# Patient Record
Sex: Female | Born: 1975
Health system: Southern US, Community
[De-identification: ages and names within clinical notes are randomized; demographics above are authoritative.]

## PROBLEM LIST (undated history)

## (undated) DIAGNOSIS — J45909 Unspecified asthma, uncomplicated: Secondary | ICD-10-CM

## (undated) DIAGNOSIS — I10 Essential (primary) hypertension: Secondary | ICD-10-CM

## (undated) DIAGNOSIS — K861 Other chronic pancreatitis: Secondary | ICD-10-CM

## (undated) DIAGNOSIS — M51379 Other intervertebral disc degeneration, lumbosacral region without mention of lumbar back pain or lower extremity pain: Secondary | ICD-10-CM

## (undated) DIAGNOSIS — M1990 Unspecified osteoarthritis, unspecified site: Secondary | ICD-10-CM

## (undated) DIAGNOSIS — R519 Headache, unspecified: Secondary | ICD-10-CM

## (undated) DIAGNOSIS — M5430 Sciatica, unspecified side: Secondary | ICD-10-CM

## (undated) DIAGNOSIS — E162 Hypoglycemia, unspecified: Secondary | ICD-10-CM

## (undated) DIAGNOSIS — E785 Hyperlipidemia, unspecified: Secondary | ICD-10-CM

## (undated) DIAGNOSIS — D352 Benign neoplasm of pituitary gland: Secondary | ICD-10-CM

## (undated) DIAGNOSIS — G4730 Sleep apnea, unspecified: Secondary | ICD-10-CM

## (undated) DIAGNOSIS — M5137 Other intervertebral disc degeneration, lumbosacral region: Secondary | ICD-10-CM

## (undated) DIAGNOSIS — E669 Obesity, unspecified: Secondary | ICD-10-CM

## (undated) HISTORY — DX: Hyperlipidemia, unspecified: E78.5

## (undated) HISTORY — PX: COLONOSCOPY: SHX174

## (undated) HISTORY — DX: Sciatica, unspecified side: M54.30

## (undated) HISTORY — DX: Obesity, unspecified: E66.9

## (undated) HISTORY — DX: Other intervertebral disc degeneration, lumbosacral region without mention of lumbar back pain or lower extremity pain: M51.379

## (undated) HISTORY — DX: Essential (primary) hypertension: I10

## (undated) HISTORY — DX: Other chronic pancreatitis: K86.1

## (undated) HISTORY — DX: Unspecified osteoarthritis, unspecified site: M19.90

## (undated) HISTORY — DX: Other intervertebral disc degeneration, lumbosacral region: M51.37

## (undated) HISTORY — PX: CHOLECYSTECTOMY: SHX55

---

## 2014-11-26 ENCOUNTER — Encounter: Payer: BLUE CROSS/BLUE SHIELD | Attending: Surgery | Admitting: Surgery

## 2014-11-26 DIAGNOSIS — I10 Essential (primary) hypertension: Secondary | ICD-10-CM | POA: Insufficient documentation

## 2014-11-26 DIAGNOSIS — T25222A Burn of second degree of left foot, initial encounter: Secondary | ICD-10-CM | POA: Insufficient documentation

## 2014-11-26 DIAGNOSIS — S91302A Unspecified open wound, left foot, initial encounter: Secondary | ICD-10-CM | POA: Diagnosis not present

## 2014-11-26 DIAGNOSIS — T25022A Burn of unspecified degree of left foot, initial encounter: Secondary | ICD-10-CM | POA: Diagnosis not present

## 2014-11-26 DIAGNOSIS — J45909 Unspecified asthma, uncomplicated: Secondary | ICD-10-CM | POA: Diagnosis not present

## 2014-11-26 DIAGNOSIS — X58XXXA Exposure to other specified factors, initial encounter: Secondary | ICD-10-CM | POA: Insufficient documentation

## 2014-11-27 NOTE — Progress Notes (Signed)
Beth [name], Beth [name] ([id number]) Visit Report for 11/26/2014 Abuse/Suicide Risk Screen Details Patient Name: Beth [name], Beth [name] Date of Service: 11/26/2014 9:00 AM Medical Record Patient Account Number: [id number] Number: Afful, RN, BSN, Treating RN: 23-Dec-1975 (39 y.o. Beth [name] Date of Birth/Sex: Female) Other Clinician: Primary Care Physician: Treating Britto, Errol Referring Physician: Lynnae Prude Physician/Extender: Weeks in Treatment: 0 Abuse/Suicide Risk Screen Items Answer ABUSE/SUICIDE RISK SCREEN: Has anyone close to you tried to hurt or harm you recentlyo No Do you feel uncomfortable with anyone in your familyo No Has anyone forced you do things that you didnot want to doo No Do you have any thoughts of harming yourselfo No Patient displays signs or symptoms of abuse and/or neglect. No Electronic Signature(s) Signed: 11/26/2014 4:46:46 PM By: Regan Lemming BSN, RN Entered By: Regan Lemming on 11/26/2014 09:12:23 Beth [name] ([id number]) -------------------------------------------------------------------------------- Activities of Daily Living Details Patient Name: Beth [name] Date of Service: 11/26/2014 9:00 AM Medical Record Patient Account Number: [id number] Number: Afful, RN, BSN, Treating RN: 1976-02-10 (39 y.o. Beth [name] Date of Birth/Sex: Female) Other Clinician: Primary Care Physician: Treating Christin Fudge Referring Physician: Lynnae Prude Physician/Extender: Suella Grove in Treatment: 0 Activities of Daily Living Items Answer Activities of Daily Living (Please select one for each item) Drive Automobile Completely Able Take Medications Completely Able Use Telephone Completely Able Care for Appearance Completely Able Use Toilet Completely Able Bath / Shower Completely Able Dress Self Completely Able Feed Self Completely Able Walk Completely Able Get In / Out Bed Completely Able Housework Completely Able Prepare Meals Completely Able Handle Money  Completely Able Shop for Self Completely Able Electronic Signature(s) Signed: 11/26/2014 4:46:46 PM By: Regan Lemming BSN, RN Entered By: Regan Lemming on 11/26/2014 09:12:00 Beth [name] ([id number]) -------------------------------------------------------------------------------- Education Assessment Details Patient Name: Beth [name] Date of Service: 11/26/2014 9:00 AM Medical Record Patient Account Number: [id number] Number: Afful, RN, BSN, Treating RN: 07-14-1975 (39 y.o. Beth [name] Date of Birth/Sex: Female) Other Clinician: Primary Care Physician: Treating Christin Fudge Referring Physician: Lynnae Prude Physician/Extender: Suella Grove in Treatment: 0 Primary Learner Assessed: Patient Learning Preferences/Education Level/Primary Language Learning Preference: Explanation Highest Education Level: College or Above Preferred Language: English Cognitive Barrier Assessment/Beliefs Language Barrier: No Physical Barrier Assessment Impaired Vision: Yes Glasses Knowledge/Comprehension Assessment Knowledge Level: High Comprehension Level: High Ability to understand written High instructions: Ability to understand verbal High instructions: Motivation Assessment Anxiety Level: Calm Cooperation: Cooperative Education Importance: Acknowledges Need Interest in Health Problems: Asks Questions Perception: Coherent Willingness to Engage in Self- High Management Activities: Readiness to Engage in Self- High Management Activities: Electronic Signature(s) Signed: 11/26/2014 4:46:46 PM By: Regan Lemming BSN, RN Entered By: Regan Lemming on 11/26/2014 09:11:37 Beth [name] ([id number]) -------------------------------------------------------------------------------- Fall Risk Assessment Details Patient Name: Beth [name] Date of Service: 11/26/2014 9:00 AM Medical Record Patient Account Number: [id number] Number: Afful, RN, BSN, Treating RN: Apr 22, 1976 (39 y.o.  Beth [name] Date of Birth/Sex: Female) Other Clinician: Primary Care Physician: Treating Christin Fudge Referring Physician: Lynnae Prude Physician/Extender: Suella Grove in Treatment: 0 Fall Risk Assessment Items FALL RISK ASSESSMENT: History of falling - immediate or within 3 months 0 No Secondary diagnosis 0 No Ambulatory aid None/bed rest/wheelchair/nurse 0 No Crutches/cane/walker 15 Yes Furniture 0 No IV Access/Saline Lock 0 No Gait/Training Normal/bed rest/immobile 0 Yes Weak 0 No Impaired 0 No Mental Status Oriented to own ability 0 Yes Electronic Signature(s) Signed: 11/26/2014 4:46:46 PM By: Regan Lemming BSN, RN Entered By: Regan Lemming on 11/26/2014 09:11:13 Beth [name] ([id number]) -------------------------------------------------------------------------------- Foot Assessment Details Patient Name: Beth [name] Date of Service:  11/26/2014 9:00 AM Medical Record Patient Account Number: [id number] Number: Afful, RN, BSN, Treating RN: [birth year] (39 y.o. Beth [name] Date of Birth/Sex: Female) Other Clinician: Primary Care Physician: Treating Britto, Errol Referring Physician: Lynnae Prude Physician/Extender: Suella Grove in Treatment: 0 Foot Assessment Items Site Locations + = Sensation present, - = Sensation absent, C = Callus, U = Ulcer R = Redness, W = Warmth, M = Maceration, PU = Pre-ulcerative lesion F = Fissure, S = Swelling, D = Dryness Assessment Right: Left: Other Deformity: No No Prior Foot Ulcer: No No Prior Amputation: No No Charcot Joint: No No Ambulatory Status: Ambulatory With Help Assistance Device: Crutches Gait: Buyer, retail Signature(s) Signed: 11/26/2014 4:46:46 PM By: Regan Lemming BSN, RN Entered By: Regan Lemming on 11/26/2014 09:10:41 Beth [name], Beth [name] ([id number]) Beth [name], Beth [name] ([id number]) -------------------------------------------------------------------------------- Nutrition Risk Assessment Details Patient Name: Beth [name] Date of Service: 11/26/2014 9:00 AM Medical Record Patient Account Number: [id number] Number: Afful, RN, BSN, Treating RN: [birth year] (39 y.o. Beth [name] Date of Birth/Sex: Female) Other Clinician: Primary Care Physician: Treating Christin Fudge Referring Physician: Lynnae Prude Physician/Extender: Weeks in Treatment: 0 Height (in): Weight (lbs): Body Mass Index (BMI): Nutrition Risk Assessment Items NUTRITION RISK SCREEN: I have an illness or condition that made me change the kind and/or 0 No amount of food I eat I eat fewer than two meals per day 0 No I eat few fruits and vegetables, or milk products 0 No I have three or more drinks of beer, liquor or wine almost every day 0 No I have tooth or mouth problems that make it hard for me to eat 0 No I don't always have enough money to buy the food I need 0 No I eat alone most of the time 0 No I take three or more different prescribed or over-the-counter drugs a 0 No day Without wanting to, I have lost or gained 10 pounds in the last six 0 No months I am not always physically able to shop, cook and/or feed myself 0 No Nutrition Protocols Good Risk Protocol 0 No interventions needed Moderate Risk Protocol Electronic Signature(s) Signed: 11/26/2014 4:46:46 PM By: Regan Lemming BSN, RN Entered By: Regan Lemming on 11/26/2014 09:10:53
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
## 2014-12-04 ENCOUNTER — Encounter: Payer: BLUE CROSS/BLUE SHIELD | Admitting: Surgery

## 2014-12-04 DIAGNOSIS — T25022A Burn of unspecified degree of left foot, initial encounter: Secondary | ICD-10-CM | POA: Diagnosis not present
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
## 2016-03-10 DIAGNOSIS — K861 Other chronic pancreatitis: Secondary | ICD-10-CM | POA: Diagnosis not present

## 2016-03-10 DIAGNOSIS — R197 Diarrhea, unspecified: Secondary | ICD-10-CM | POA: Diagnosis not present

## 2016-03-10 DIAGNOSIS — K909 Intestinal malabsorption, unspecified: Secondary | ICD-10-CM | POA: Diagnosis not present

## 2016-03-13 DIAGNOSIS — K909 Intestinal malabsorption, unspecified: Secondary | ICD-10-CM | POA: Diagnosis not present

## 2016-03-14 ENCOUNTER — Other Ambulatory Visit: Payer: Self-pay | Admitting: Physician Assistant

## 2016-03-14 DIAGNOSIS — K909 Intestinal malabsorption, unspecified: Secondary | ICD-10-CM

## 2016-03-14 DIAGNOSIS — K861 Other chronic pancreatitis: Secondary | ICD-10-CM

## 2016-03-21 ENCOUNTER — Ambulatory Visit
Admission: RE | Admit: 2016-03-21 | Discharge: 2016-03-21 | Disposition: A | Discharge status: Home / Self Care | Payer: BLUE CROSS/BLUE SHIELD | Source: Ambulatory Visit | Attending: Physician Assistant | Admitting: Physician Assistant

## 2016-03-21 DIAGNOSIS — K909 Intestinal malabsorption, unspecified: Secondary | ICD-10-CM

## 2016-03-21 DIAGNOSIS — K861 Other chronic pancreatitis: Secondary | ICD-10-CM | POA: Diagnosis not present

## 2016-03-21 MED ORDER — GADOBENATE DIMEGLUMINE 529 MG/ML IV SOLN
20.0000 mL | Freq: Once | INTRAVENOUS | Status: AC | PRN
  Administered 2016-03-21: 20 mL via INTRAVENOUS

## 2016-04-28 DIAGNOSIS — K861 Other chronic pancreatitis: Secondary | ICD-10-CM | POA: Diagnosis not present

## 2016-05-11 DIAGNOSIS — K861 Other chronic pancreatitis: Secondary | ICD-10-CM | POA: Diagnosis not present

## 2016-05-11 DIAGNOSIS — J45909 Unspecified asthma, uncomplicated: Secondary | ICD-10-CM | POA: Diagnosis not present

## 2016-05-11 DIAGNOSIS — N912 Amenorrhea, unspecified: Secondary | ICD-10-CM | POA: Diagnosis not present

## 2016-05-11 DIAGNOSIS — D352 Benign neoplasm of pituitary gland: Secondary | ICD-10-CM | POA: Diagnosis not present

## 2016-11-09 DIAGNOSIS — K861 Other chronic pancreatitis: Secondary | ICD-10-CM | POA: Diagnosis not present

## 2016-11-09 DIAGNOSIS — Z6838 Body mass index (BMI) 38.0-38.9, adult: Secondary | ICD-10-CM | POA: Insufficient documentation

## 2016-11-09 DIAGNOSIS — E782 Mixed hyperlipidemia: Secondary | ICD-10-CM | POA: Diagnosis not present

## 2016-11-09 DIAGNOSIS — D352 Benign neoplasm of pituitary gland: Secondary | ICD-10-CM | POA: Diagnosis present

## 2016-11-09 DIAGNOSIS — B3789 Other sites of candidiasis: Secondary | ICD-10-CM | POA: Insufficient documentation

## 2016-11-09 DIAGNOSIS — J4520 Mild intermittent asthma, uncomplicated: Secondary | ICD-10-CM | POA: Insufficient documentation

## 2017-07-16 DIAGNOSIS — Z01419 Encounter for gynecological examination (general) (routine) without abnormal findings: Secondary | ICD-10-CM | POA: Diagnosis not present

## 2017-07-27 DIAGNOSIS — K8689 Other specified diseases of pancreas: Secondary | ICD-10-CM | POA: Diagnosis not present

## 2017-07-27 DIAGNOSIS — K219 Gastro-esophageal reflux disease without esophagitis: Secondary | ICD-10-CM | POA: Diagnosis not present

## 2017-10-12 DIAGNOSIS — D352 Benign neoplasm of pituitary gland: Secondary | ICD-10-CM | POA: Diagnosis not present

## 2017-10-12 DIAGNOSIS — G43909 Migraine, unspecified, not intractable, without status migrainosus: Secondary | ICD-10-CM | POA: Insufficient documentation

## 2017-10-12 DIAGNOSIS — K861 Other chronic pancreatitis: Secondary | ICD-10-CM | POA: Diagnosis not present

## 2017-10-12 DIAGNOSIS — R10813 Right lower quadrant abdominal tenderness: Secondary | ICD-10-CM | POA: Diagnosis not present

## 2017-10-12 DIAGNOSIS — J029 Acute pharyngitis, unspecified: Secondary | ICD-10-CM | POA: Diagnosis not present

## 2017-10-12 DIAGNOSIS — R1110 Vomiting, unspecified: Secondary | ICD-10-CM | POA: Diagnosis not present

## 2018-02-15 ENCOUNTER — Encounter: Payer: Self-pay | Admitting: Endocrinology

## 2018-02-15 ENCOUNTER — Ambulatory Visit: Payer: BLUE CROSS/BLUE SHIELD | Admitting: Endocrinology

## 2018-02-15 DIAGNOSIS — E221 Hyperprolactinemia: Secondary | ICD-10-CM

## 2018-02-15 DIAGNOSIS — K861 Other chronic pancreatitis: Secondary | ICD-10-CM | POA: Diagnosis not present

## 2018-02-15 LAB — TSH: TSH: 0.42 u[IU]/mL (ref 0.35–4.50)

## 2018-02-15 LAB — T4, FREE: Free T4: 0.78 ng/dL (ref 0.60–1.60)

## 2018-02-15 NOTE — Progress Notes (Signed)
Subjective:    Patient ID: Beth [name], female    DOB: 1975-12-16, 42 y.o.   MRN: [id number]  HPI Pt is referred by Rebecca Eaton, PA, for hyperprolactinemia.  Pt had menarche at age 33.  She has had no menses since 2012.  She is G4P3.   She was noted to have an elevated prolactin in 2013. She reported slight drainage from both breasts, and assoc hair growth on the face.  She denies the following: excessive exercise, opiates, antipsychotics, brain XRT, brain surgery, cirrhosis, thyroid dz, and seizures.  She takes chronic phenergan, (not zofran) for nausea (chronic pancreatitis).  Pt denies h/o h/o liver dz, infertility, seizures, PCO, renal dz, zoster, brain injury, or chest wall injury.  She has Mirena IUD.  Parlodel was recently resumed at [facility].  She is unaware of ever having had MRI.   Past Medical History:  Diagnosis Date  . Chronic pancreatitis [facility])      Social History   Socioeconomic History  . Marital status: Divorced    Spouse name: Not on file  . Number of children: Not on file  . Years of education: Not on file  . Highest education level: Not on file  Occupational History  . Not on file  Social Needs  . Financial resource strain: Not on file  . Food insecurity:    Worry: Not on file    Inability: Not on file  . Transportation needs:    Medical: Not on file    Non-medical: Not on file  Tobacco Use  . Smoking status: Never Smoker  . Smokeless tobacco: Never Used  Substance and Sexual Activity  . Alcohol use: Never    Frequency: Never  . Drug use: Never  . Sexual activity: Yes  Lifestyle  . Physical activity:    Days per week: Not on file    Minutes per session: Not on file  . Stress: Not on file  Relationships  . Social connections:    Talks on phone: Not on file    Gets together: Not on file    Attends religious service: Not on file    Active member of club or organization: Not on file    Attends meetings of clubs or organizations: Not on file   Relationship status: Not on file  . Intimate partner violence:    Fear of current or ex partner: Not on file    Emotionally abused: Not on file    Physically abused: Not on file    Forced sexual activity: Not on file  Other Topics Concern  . Not on file  Social History Narrative  . Not on file    Current Outpatient Medications on File Prior to Visit  Medication Sig Dispense Refill  . bromocriptine (PARLODEL) 2.5 MG tablet bromocriptine 2.5 mg tablet    . EPINEPHrine (EPIPEN 2-PAK) 0.3 mg/0.3 mL IJ SOAJ injection Inject into the muscle once.    . naftifine (NAFTIN) 1 % cream Apply topically.    . naproxen sodium (ALEVE) 220 MG tablet Take 220 mg by mouth.    . omega-3 fish oil (MAXEPA) 1000 MG CAPS capsule Take 2 capsules by mouth.    . Pancrelipase, Lip-Prot-Amyl, (CREON) 24000-76000 units CPEP TK 2 CS PO TID WITH MEALS AND 1 BID WITH SNACKS. MAX OF 8 PER DAY.    Marland Kitchen atorvastatin (LIPITOR) 10 MG tablet Take by mouth.    Marland Kitchen HYDROcodone-acetaminophen (NORCO/VICODIN) 5-325 MG tablet Take by mouth.    . levonorgestrel (MIRENA)  20 MCG/24HR IUD by Intrauterine route.    . meloxicam (MOBIC) 7.5 MG tablet Take by mouth.    . mometasone-formoterol (DULERA) 100-5 MCG/ACT AERO Inhale into the lungs.    . ondansetron (ZOFRAN-ODT) 4 MG disintegrating tablet ondansetron 4 mg disintegrating tablet    . tizanidine (ZANAFLEX) 2 MG capsule Take by mouth.     No current facility-administered medications on file prior to visit.     Allergies  Allergen Reactions  . [place] Swelling  . [place] Grass   . Cedar Swelling  . Cladosporium Cladosporioides   . Dust Mite Extract   . Elm Bark [Ulmus Fulva] Swelling  . Lidocaine Hcl Swelling  . Molds & Smuts   . Mugwort   . Nettle [Urtica Dioica]   . Sorrel-Dock Mix [Sheep Sorrel-Yellow Dock]   . Timothy Grass Pollen Allergen   . Xylocaine  [Lidocaine]     Family History  Problem Relation Age of Onset  . Other Paternal Aunt        pituitary surgery     BP 132/78 (BP Location: Right Arm, Patient Position: Sitting, Cuff Size: Normal)   Pulse 67   Ht 5\' 9"  (1.753 m)   Wt 264 lb 6.4 oz (119.9 kg)   SpO2 97%   BMI 39.05 kg/m    Review of Systems Denies sob, excessive sweating, hair loss on the head, diplopia, depression, cold intolerance, numbness, voice change, fatigue, change in skin tone, easy bruising, and dry skin.  She has intermitt headache, insomnia, pelvic cramps, gain, and leg swelling.      Objective:   Physical Exam VS: see vs page GEN: no distress HEAD: head: no deformity.  Slight terminal hair growth on the face.   eyes: no periorbital swelling, no proptosis external nose and ears are normal mouth: no lesion seen NECK: supple, thyroid is not enlarged CHEST WALL: no deformity LUNGS: clear to auscultation CV: reg rate and rhythm, no murmur.   ABD: abdomen is soft, nontender.  no hepatosplenomegaly.  not distended.  no hernia MUSCULOSKELETAL: muscle bulk and strength are grossly normal.  no obvious joint swelling.  gait is normal and steady EXTEMITIES: no deformity.  no edema PULSES: no carotid bruit NEURO:  cn 2-12 grossly intact.   readily moves all 4's.  sensation is intact to touch on all 4's SKIN:  Normal texture and temperature.  No rash or suspicious lesion is visible.   NODES:  None palpable at the neck PSYCH: alert, well-oriented.  Does not appear anxious nor depressed.    We are requesting labs from [facility].    I have reviewed outside records, and summarized: Pt was noted to have elevated a1c, and referred here.  Pt was noted to also have h/o cholecystectomy and chronic pancreatitis.       Assessment & Plan:  Hyperprolactinemia, new to me.  Poss due to phenergan.  Patient Instructions  blood tests are requested for you today.  We'll let you know about the results.  Please come back for a follow-up appointment in 6 months.

## 2018-02-15 NOTE — Patient Instructions (Addendum)
blood tests are requested for you today.  We'll let you know about the results.   Please come back for a follow-up appointment in 6 months.  

## 2018-02-17 ENCOUNTER — Other Ambulatory Visit: Payer: Self-pay | Admitting: Endocrinology

## 2018-02-17 ENCOUNTER — Encounter: Payer: Self-pay | Admitting: Endocrinology

## 2018-02-17 DIAGNOSIS — K861 Other chronic pancreatitis: Secondary | ICD-10-CM | POA: Insufficient documentation

## 2018-02-17 DIAGNOSIS — E221 Hyperprolactinemia: Secondary | ICD-10-CM

## 2018-02-17 MED ORDER — BROMOCRIPTINE MESYLATE 5 MG PO CAPS: 5.0000 mg | ORAL_CAPSULE | Freq: Every day | ORAL | 3 refills | Status: DC

## 2018-02-24 LAB — INSULIN-LIKE GROWTH FACTOR
IGF-I, LC/MS: 99 ng/mL (ref 52–328)
Z-Score (Female): -0.8 SD (ref ?–2.0)

## 2018-02-24 LAB — PROLACTIN: Prolactin: 34.9 ng/mL — ABNORMAL HIGH

## 2018-02-24 LAB — SOMATOSTATIN: SOMATOSTATIN: 30 pg/mL

## 2018-03-22 ENCOUNTER — Telehealth: Payer: Self-pay | Admitting: Endocrinology

## 2018-03-22 ENCOUNTER — Other Ambulatory Visit (INDEPENDENT_AMBULATORY_CARE_PROVIDER_SITE_OTHER): Payer: BLUE CROSS/BLUE SHIELD

## 2018-03-22 DIAGNOSIS — E221 Hyperprolactinemia: Secondary | ICD-10-CM

## 2018-03-22 NOTE — Telephone Encounter (Signed)
Please advise on below  

## 2018-03-22 NOTE — Telephone Encounter (Signed)
I have sent a prescription to your pharmacy, for 2.5 mg Alternative is for another medication.  Please let me know if we need to change

## 2018-03-22 NOTE — Telephone Encounter (Signed)
Pt. Is stating this medication bromocriptine (PARLODEL) 5 MG capsule is way to strong and needs a lower dose. Please advice. Ph # [phone]

## 2018-03-23 LAB — PROLACTIN: Prolactin: 5.5 ng/mL

## 2018-03-25 NOTE — Telephone Encounter (Signed)
Pt infromed

## 2018-08-12 DIAGNOSIS — Z01419 Encounter for gynecological examination (general) (routine) without abnormal findings: Secondary | ICD-10-CM | POA: Diagnosis not present

## 2018-08-12 DIAGNOSIS — Z202 Contact with and (suspected) exposure to infections with a predominantly sexual mode of transmission: Secondary | ICD-10-CM | POA: Diagnosis not present

## 2018-08-22 ENCOUNTER — Ambulatory Visit: Payer: BLUE CROSS/BLUE SHIELD | Admitting: Endocrinology

## 2018-08-27 DIAGNOSIS — J45909 Unspecified asthma, uncomplicated: Secondary | ICD-10-CM | POA: Diagnosis not present

## 2018-09-24 ENCOUNTER — Ambulatory Visit: Payer: BLUE CROSS/BLUE SHIELD | Admitting: Endocrinology

## 2018-10-28 ENCOUNTER — Telehealth: Payer: Self-pay

## 2018-10-28 NOTE — Telephone Encounter (Signed)
Pt overdue for an appt. Called to schedule an appt. VM not set up. No alternate # to call to schedule.

## 2019-01-31 DIAGNOSIS — I10 Essential (primary) hypertension: Secondary | ICD-10-CM | POA: Diagnosis not present

## 2019-01-31 DIAGNOSIS — R609 Edema, unspecified: Secondary | ICD-10-CM | POA: Diagnosis not present

## 2019-02-19 DIAGNOSIS — I10 Essential (primary) hypertension: Secondary | ICD-10-CM | POA: Diagnosis not present

## 2019-04-01 ENCOUNTER — Other Ambulatory Visit: Payer: Self-pay

## 2019-04-03 ENCOUNTER — Other Ambulatory Visit: Payer: Self-pay

## 2019-04-03 ENCOUNTER — Encounter: Payer: Self-pay | Admitting: Endocrinology

## 2019-04-03 ENCOUNTER — Ambulatory Visit (INDEPENDENT_AMBULATORY_CARE_PROVIDER_SITE_OTHER): Payer: BC Managed Care – PPO | Admitting: Endocrinology

## 2019-04-03 VITALS — BP 104/74 | HR 100 | Ht 69.0 in | Wt 257.8 lb

## 2019-04-03 DIAGNOSIS — E221 Hyperprolactinemia: Secondary | ICD-10-CM

## 2019-04-03 LAB — TSH: TSH: 1.24 u[IU]/mL (ref 0.35–4.50)

## 2019-04-03 NOTE — Patient Instructions (Signed)
Blood tests are requested for you today.  We'll let you know about the results.  Based on the results, i'll refill the bromocriptine. the elevated prolactin is probably due to the promethazine, so we can possibly further reduce the bromocriptine.  Please come back for a follow-up appointment in 1 year.

## 2019-04-03 NOTE — Progress Notes (Signed)
Subjective:    Patient ID: Beth [name], female    DOB: 04/21/1976, 43 y.o.   MRN: [id number]  HPI Pt returns for f/u of hyperprolactinemia (dx'ed 2013, when she presented with galactorrhea; she has never had pituitary imaging; she has had no menses since 2012.  She is G4P3; she takes chronic phenergan, for nausea (chronic pancreatitis; she has Mirena IUD).  She has not recently taken phenergan.  pt states she feels well in general. Past Medical History:  Diagnosis Date  . Chronic pancreatitis [facility])    Social History   Socioeconomic History  . Marital status: Divorced    Spouse name: Not on file  . Number of children: Not on file  . Years of education: Not on file  . Highest education level: Not on file  Occupational History  . Not on file  Social Needs  . Financial resource strain: Not on file  . Food insecurity    Worry: Not on file    Inability: Not on file  . Transportation needs    Medical: Not on file    Non-medical: Not on file  Tobacco Use  . Smoking status: Never Smoker  . Smokeless tobacco: Never Used  Substance and Sexual Activity  . Alcohol use: Never    Frequency: Never  . Drug use: Never  . Sexual activity: Yes  Lifestyle  . Physical activity    Days per week: Not on file    Minutes per session: Not on file  . Stress: Not on file  Relationships  . Social Herbalist on phone: Not on file    Gets together: Not on file    Attends religious service: Not on file    Active member of club or organization: Not on file    Attends meetings of clubs or organizations: Not on file    Relationship status: Not on file  . Intimate partner violence    Fear of current or ex partner: Not on file    Emotionally abused: Not on file    Physically abused: Not on file    Forced sexual activity: Not on file  Other Topics Concern  . Not on file  Social History Narrative  . Not on file    Current Outpatient Medications on File Prior to Visit  Medication  Sig Dispense Refill  . albuterol (PROAIR HFA) 108 (90 Base) MCG/ACT inhaler Inhale 1 puff into the lungs as needed.    Marland Kitchen atorvastatin (LIPITOR) 10 MG tablet Take by mouth.    . EPINEPHrine (EPIPEN 2-PAK) 0.3 mg/0.3 mL IJ SOAJ injection Inject into the muscle once.    . hydrochlorothiazide (HYDRODIURIL) 12.5 MG tablet Take 12.5 mg by mouth daily.    Marland Kitchen HYDROcodone-acetaminophen (NORCO/VICODIN) 5-325 MG tablet Take by mouth.    . levonorgestrel (MIRENA) 20 MCG/24HR IUD by Intrauterine route.    . meloxicam (MOBIC) 7.5 MG tablet Take by mouth.    . mometasone-formoterol (DULERA) 100-5 MCG/ACT AERO Inhale into the lungs.    . naftifine (NAFTIN) 1 % cream Apply topically.    . naproxen sodium (ALEVE) 220 MG tablet Take 220 mg by mouth.    . omega-3 fish oil (MAXEPA) 1000 MG CAPS capsule Take 2 capsules by mouth.    . ondansetron (ZOFRAN-ODT) 4 MG disintegrating tablet ondansetron 4 mg disintegrating tablet    . Pancrelipase, Lip-Prot-Amyl, (CREON) 24000-76000 units CPEP TK 2 CS PO TID WITH MEALS AND 1 BID WITH SNACKS. MAX OF 8 PER DAY.    Marland Kitchen  tizanidine (ZANAFLEX) 2 MG capsule Take by mouth.     No current facility-administered medications on file prior to visit.     Allergies  Allergen Reactions  . [place] Swelling  . [place] Grass   . Cedar Swelling  . Cladosporium Cladosporioides   . Dust Mite Extract   . Elm Bark [Ulmus Fulva] Swelling  . Lidocaine Hcl Swelling  . Molds & Smuts   . Mugwort   . Nettle [Urtica Dioica]   . Sorrel-Dock Mix [Sheep Sorrel-Yellow Dock]   . Timothy Grass Pollen Allergen   . Xylocaine  [Lidocaine]     Family History  Problem Relation Age of Onset  . Other Paternal Aunt        pituitary surgery    BP 104/74 (BP Location: Right Arm, Patient Position: Sitting, Cuff Size: Large)   Pulse 100   Ht 5\' 9"  (1.753 m)   Wt 257 lb 12.8 oz (116.9 kg)   SpO2 95%   BMI 38.07 kg/m    Review of Systems Denies dizziness, but she has fatigue.     Objective:    Physical Exam VITAL SIGNS:  See vs page GENERAL: no distress Ext: no leg edema   Prolactin=35    Assessment & Plan:  Hyperprolactinemia, worse off rx  Patient Instructions  Blood tests are requested for you today.  We'll let you know about the results.  Based on the results, i'll refill the bromocriptine. the elevated prolactin is probably due to the promethazine, so we can possibly further reduce the bromocriptine.  Please come back for a follow-up appointment in 1 year.

## 2019-04-04 LAB — PROLACTIN: Prolactin: 32.9 ng/mL — ABNORMAL HIGH

## 2019-04-04 MED ORDER — BROMOCRIPTINE MESYLATE 2.5 MG PO TABS: 1.2500 mg | ORAL_TABLET | Freq: Every day | ORAL | 3 refills | Status: DC

## 2019-04-25 DIAGNOSIS — K8689 Other specified diseases of pancreas: Secondary | ICD-10-CM | POA: Diagnosis not present

## 2019-04-25 DIAGNOSIS — K219 Gastro-esophageal reflux disease without esophagitis: Secondary | ICD-10-CM | POA: Diagnosis not present

## 2019-04-25 DIAGNOSIS — R198 Other specified symptoms and signs involving the digestive system and abdomen: Secondary | ICD-10-CM | POA: Diagnosis not present

## 2019-05-15 ENCOUNTER — Other Ambulatory Visit: Payer: BC Managed Care – PPO

## 2019-05-15 ENCOUNTER — Other Ambulatory Visit: Payer: Self-pay

## 2019-05-15 DIAGNOSIS — E221 Hyperprolactinemia: Secondary | ICD-10-CM | POA: Diagnosis not present

## 2019-05-16 ENCOUNTER — Telehealth: Payer: Self-pay

## 2019-05-16 LAB — PROLACTIN: Prolactin: 5.4 ng/mL

## 2019-05-16 NOTE — Telephone Encounter (Signed)
Lab results reviewed by Dr. Ellison. Letter has been mailed. For future reference, letter can be found in Epic. 

## 2019-05-16 NOTE — Telephone Encounter (Signed)
-----   Message from Renato Shin, MD sent at 05/16/2019 11:21 AM EST ----- please contact patient: Normal.  Please continue the same medications.

## 2019-06-09 DIAGNOSIS — K861 Other chronic pancreatitis: Secondary | ICD-10-CM | POA: Diagnosis not present

## 2019-06-09 DIAGNOSIS — Z30431 Encounter for routine checking of intrauterine contraceptive device: Secondary | ICD-10-CM | POA: Insufficient documentation

## 2019-06-09 DIAGNOSIS — I10 Essential (primary) hypertension: Secondary | ICD-10-CM | POA: Diagnosis not present

## 2019-06-09 DIAGNOSIS — E782 Mixed hyperlipidemia: Secondary | ICD-10-CM | POA: Diagnosis not present

## 2019-06-09 DIAGNOSIS — K5901 Slow transit constipation: Secondary | ICD-10-CM | POA: Insufficient documentation

## 2019-06-09 DIAGNOSIS — R5383 Other fatigue: Secondary | ICD-10-CM | POA: Diagnosis not present

## 2019-06-09 DIAGNOSIS — Z6838 Body mass index (BMI) 38.0-38.9, adult: Secondary | ICD-10-CM | POA: Diagnosis not present

## 2019-06-23 DIAGNOSIS — Z1159 Encounter for screening for other viral diseases: Secondary | ICD-10-CM | POA: Diagnosis not present

## 2019-06-24 DIAGNOSIS — Z8371 Family history of colonic polyps: Secondary | ICD-10-CM | POA: Diagnosis not present

## 2019-06-24 DIAGNOSIS — D125 Benign neoplasm of sigmoid colon: Secondary | ICD-10-CM | POA: Diagnosis not present

## 2019-06-24 DIAGNOSIS — K648 Other hemorrhoids: Secondary | ICD-10-CM | POA: Diagnosis not present

## 2019-06-24 DIAGNOSIS — Z800 Family history of malignant neoplasm of digestive organs: Secondary | ICD-10-CM | POA: Diagnosis not present

## 2019-06-24 DIAGNOSIS — K5730 Diverticulosis of large intestine without perforation or abscess without bleeding: Secondary | ICD-10-CM | POA: Diagnosis not present

## 2019-06-24 DIAGNOSIS — D120 Benign neoplasm of cecum: Secondary | ICD-10-CM | POA: Diagnosis not present

## 2019-08-14 DIAGNOSIS — Z01419 Encounter for gynecological examination (general) (routine) without abnormal findings: Secondary | ICD-10-CM | POA: Diagnosis not present

## 2019-08-14 DIAGNOSIS — Z113 Encounter for screening for infections with a predominantly sexual mode of transmission: Secondary | ICD-10-CM | POA: Diagnosis not present

## 2019-09-11 DIAGNOSIS — Z1231 Encounter for screening mammogram for malignant neoplasm of breast: Secondary | ICD-10-CM | POA: Diagnosis not present

## 2019-10-10 DIAGNOSIS — Z03818 Encounter for observation for suspected exposure to other biological agents ruled out: Secondary | ICD-10-CM | POA: Diagnosis not present

## 2019-10-10 DIAGNOSIS — Z20828 Contact with and (suspected) exposure to other viral communicable diseases: Secondary | ICD-10-CM | POA: Diagnosis not present

## 2019-12-05 ENCOUNTER — Emergency Department (HOSPITAL_COMMUNITY): Payer: BC Managed Care – PPO

## 2019-12-05 ENCOUNTER — Encounter (HOSPITAL_COMMUNITY): Payer: Self-pay | Admitting: Emergency Medicine

## 2019-12-05 ENCOUNTER — Emergency Department (HOSPITAL_COMMUNITY)
Admission: EM | Admit: 2019-12-05 | Discharge: 2019-12-05 | Disposition: A | Discharge status: Home / Self Care | Payer: BC Managed Care – PPO | Attending: Emergency Medicine | Admitting: Emergency Medicine

## 2019-12-05 ENCOUNTER — Other Ambulatory Visit: Payer: Self-pay

## 2019-12-05 DIAGNOSIS — J1282 Pneumonia due to coronavirus disease 2019: Secondary | ICD-10-CM | POA: Insufficient documentation

## 2019-12-05 DIAGNOSIS — R509 Fever, unspecified: Secondary | ICD-10-CM | POA: Diagnosis not present

## 2019-12-05 DIAGNOSIS — Z03818 Encounter for observation for suspected exposure to other biological agents ruled out: Secondary | ICD-10-CM | POA: Diagnosis not present

## 2019-12-05 DIAGNOSIS — Z20822 Contact with and (suspected) exposure to covid-19: Secondary | ICD-10-CM | POA: Diagnosis not present

## 2019-12-05 DIAGNOSIS — Z79899 Other long term (current) drug therapy: Secondary | ICD-10-CM | POA: Diagnosis not present

## 2019-12-05 DIAGNOSIS — U071 COVID-19: Secondary | ICD-10-CM | POA: Diagnosis not present

## 2019-12-05 HISTORY — DX: Hypoglycemia, unspecified: E16.2

## 2019-12-05 HISTORY — DX: Unspecified asthma, uncomplicated: J45.909

## 2019-12-05 HISTORY — DX: Benign neoplasm of pituitary gland: D35.2

## 2019-12-05 LAB — CBC
HCT: 40 % (ref 36.0–46.0)
Hemoglobin: 12.9 g/dL (ref 12.0–15.0)
MCH: 27.4 pg (ref 26.0–34.0)
MCHC: 32.3 g/dL (ref 30.0–36.0)
MCV: 84.9 fL (ref 80.0–100.0)
Platelets: 214 10*3/uL (ref 150–400)
RBC: 4.71 MIL/uL (ref 3.87–5.11)
RDW: 13.4 % (ref 11.5–15.5)
WBC: 3.3 10*3/uL — ABNORMAL LOW (ref 4.0–10.5)
nRBC: 0 % (ref 0.0–0.2)

## 2019-12-05 LAB — BASIC METABOLIC PANEL WITH GFR
Anion gap: 9 (ref 5–15)
BUN: 8 mg/dL (ref 6–20)
CO2: 24 mmol/L (ref 22–32)
Calcium: 8.8 mg/dL — ABNORMAL LOW (ref 8.9–10.3)
Chloride: 103 mmol/L (ref 98–111)
Creatinine, Ser: 1.05 mg/dL — ABNORMAL HIGH (ref 0.44–1.00)
GFR calc Af Amer: >60 mL/min
GFR calc non Af Amer: >60 mL/min
Glucose, Bld: 93 mg/dL (ref 70–99)
Potassium: 3.8 mmol/L (ref 3.5–5.1)
Sodium: 136 mmol/L (ref 135–145)

## 2019-12-05 LAB — I-STAT BETA HCG BLOOD, ED (MC, WL, AP ONLY): I-stat hCG, quantitative: <5 m[IU]/mL (ref <5)

## 2019-12-05 MED ORDER — SODIUM CHLORIDE 0.9 % IV BOLUS
1000.0000 mL | Freq: Once | INTRAVENOUS | Status: AC
  Administered 2019-12-05: 1000 mL via INTRAVENOUS

## 2019-12-05 MED ORDER — KETOROLAC TROMETHAMINE 30 MG/ML IJ SOLN
30.0000 mg | Freq: Once | INTRAMUSCULAR | Status: AC
  Administered 2019-12-05: 30 mg via INTRAVENOUS
  Filled 2019-12-05: qty 1

## 2019-12-05 MED ORDER — ALBUTEROL SULFATE HFA 108 (90 BASE) MCG/ACT IN AERS
2.0000 | INHALATION_SPRAY | Freq: Once | RESPIRATORY_TRACT | Status: AC
  Administered 2019-12-05: 2 via RESPIRATORY_TRACT
  Filled 2019-12-05: qty 6.7

## 2019-12-05 MED ORDER — PREDNISONE 20 MG PO TABS
40.0000 mg | ORAL_TABLET | Freq: Every day | ORAL | 0 refills | Status: DC
Start: 2019-12-05 — End: 2019-12-19

## 2019-12-05 MED ORDER — ACETAMINOPHEN 500 MG PO TABS
1000.0000 mg | ORAL_TABLET | Freq: Once | ORAL | Status: AC
  Administered 2019-12-05: 1000 mg via ORAL
  Filled 2019-12-05: qty 2

## 2019-12-05 MED ORDER — DEXAMETHASONE SODIUM PHOSPHATE 10 MG/ML IJ SOLN
10.0000 mg | Freq: Once | INTRAMUSCULAR | Status: AC
  Administered 2019-12-05: 10 mg via INTRAVENOUS
  Filled 2019-12-05: qty 1

## 2019-12-05 NOTE — ED Triage Notes (Signed)
Pt here after testing positive for covid at [facility]. Pt sts they told her with her medical hx she should come here and make sure she doesn't have pneumonia. Endorses cough, fever, loss of appetite, headache, body aches.

## 2019-12-05 NOTE — ED Provider Notes (Signed)
[facility] EMERGENCY DEPARTMENT Provider Note   CSN: [id number] Arrival date & time: 12/05/19  1550     History No chief complaint on file.   Niaomi Mancinelli is a 44 y.o. female.  HPI  Patient presents with diagnosis of coronavirus, after developing illness about 5 days ago. She notes that since illness began she has had fever, fatigue, myalgia, cough.  She has been attempting to use her albuterol inhaler, Tylenol, but has minimal symptomatic relief. She diagnosed positive today, after symptoms are persistent. She notes that prior to the onset of this illness she was generally well, has not received any Covid vaccines. She has 1 family member with similar illness. No focal chest pain, abdominal pain, no vomiting, no confusion, no disorientation.     Past Medical History:  Diagnosis Date  . Asthma   . Chronic pancreatitis ([place])   . Hypoglycemia   . Prolactinoma [facility])     Patient Active Problem List   Diagnosis Date Noted  . Chronic pancreatitis ([place])   . Hyperprolactinemia ([place]) 02/15/2018     Family History  Problem Relation Age of Onset  . Other Paternal Aunt        pituitary surgery    Social History   Tobacco Use  . Smoking status: Never Smoker  . Smokeless tobacco: Never Used  Substance Use Topics  . Alcohol use: Never  . Drug use: Never    Home Medications Prior to Admission medications   Medication Sig Start Date End Date Taking? Authorizing Provider  albuterol (PROAIR HFA) 108 (90 Base) MCG/ACT inhaler Inhale 1 puff into the lungs as needed.    [provider]  atorvastatin (LIPITOR) 10 MG tablet Take by mouth.    [provider]  bromocriptine (PARLODEL) 2.5 MG tablet Take 0.5 tablets (1.25 mg total) by mouth at bedtime. 04/04/19   Renato Shin, MD  EPINEPHrine (EPIPEN 2-PAK) 0.3 mg/0.3 mL IJ SOAJ injection Inject into the muscle once.    [provider]  hydrochlorothiazide (HYDRODIURIL) 12.5 MG  tablet Take 12.5 mg by mouth daily. 03/20/19   [provider]  HYDROcodone-acetaminophen (NORCO/VICODIN) 5-325 MG tablet Take by mouth.    [provider]  levonorgestrel (MIRENA) 20 MCG/24HR IUD by Intrauterine route.    [provider]  meloxicam (MOBIC) 7.5 MG tablet Take by mouth.    [provider]  mometasone-formoterol (DULERA) 100-5 MCG/ACT AERO Inhale into the lungs.    [provider]  naftifine (NAFTIN) 1 % cream Apply topically. 11/09/16   [provider]  naproxen sodium (ALEVE) 220 MG tablet Take 220 mg by mouth.    [provider]  omega-3 fish oil (MAXEPA) 1000 MG CAPS capsule Take 2 capsules by mouth.    [provider]  ondansetron (ZOFRAN-ODT) 4 MG disintegrating tablet ondansetron 4 mg disintegrating tablet    [provider]  Pancrelipase, Lip-Prot-Amyl, (CREON) 24000-76000 units CPEP TK 2 CS PO TID WITH MEALS AND 1 BID WITH SNACKS. MAX OF 8 PER DAY. 04/20/17   [provider]  predniSONE (DELTASONE) 20 MG tablet Take 2 tablets (40 mg total) by mouth daily with breakfast. For the next four days 12/05/19   Carmin Muskrat, MD  tizanidine (ZANAFLEX) 2 MG capsule Take by mouth.    [provider]    [place], [place] grass, Cedar, Cladosporium cladosporioides, Dust mite extract, Elm bark [ulmus fulva], Lidocaine hcl, Molds & smuts, Mugwort, Nettle [urtica dioica], Sorrel-dock mix [employer]  sorrel-yellow dock], Timothy grass pollen allergen, and Xylocaine  [lidocaine]  Review of Systems   Review of Systems  Constitutional:       Per HPI, otherwise negative  HENT:       Per HPI, otherwise negative  Respiratory:       Per HPI, otherwise negative  Cardiovascular:       Per HPI, otherwise negative  Gastrointestinal: Negative for vomiting.  Endocrine:       Negative aside from HPI  Genitourinary:       Neg aside from HPI   Musculoskeletal:       Per HPI, otherwise  negative  Skin: Negative.   Neurological: Negative for syncope.    Physical Exam Updated Vital Signs BP 134/77   Pulse 90   Temp 99.1 F (37.3 C) (Oral)   Resp 20   Ht 5\' 9"  (1.753 m)   Wt 117.9 kg   SpO2 97%   BMI 38.40 kg/m   Physical Exam Vitals and nursing note reviewed.  Constitutional:      General: She is not in acute distress.    Appearance: She is well-developed.  HENT:     Head: Normocephalic and atraumatic.  Eyes:     Conjunctiva/sclera: Conjunctivae normal.  Cardiovascular:     Rate and Rhythm: Normal rate and regular rhythm.  Pulmonary:     Effort: Pulmonary effort is normal. No respiratory distress.     Breath sounds: Normal breath sounds. No stridor.  Abdominal:     General: There is no distension.  Skin:    General: Skin is warm and dry.  Neurological:     Mental Status: She is alert and oriented to person, place, and time.     Cranial Nerves: No cranial nerve deficit.     ED Results / Procedures / Treatments   Labs (all labs ordered are listed, but only abnormal results are displayed) Labs Reviewed  CBC - Abnormal; Notable for the following components:      Result Value   WBC 3.3 (*)    All other components within normal limits  BASIC METABOLIC PANEL - Abnormal; Notable for the following components:   Creatinine, Ser 1.05 (*)    Calcium 8.8 (*)    All other components within normal limits  I-STAT BETA HCG BLOOD, ED ([facility], [facility], [facility] ONLY)    EKG None  Radiology DG Chest Portable 1 View  Result Date: 12/05/2019 CLINICAL DATA:  COVID-19 positivity EXAM: PORTABLE CHEST 1 VIEW COMPARISON:  None. FINDINGS: Cardiac shadow is within normal limits. The lungs are well aerated bilaterally. Patchy opacities are noted in the right lung base consistent with the given clinical history. No sizable effusion or pneumothorax is noted. No bony abnormality is seen. IMPRESSION: Patchy opacities in the right base consistent with the given clinical history.  Electronically Signed   By: Inez Catalina M.D.   On: 12/05/2019 22:02    Procedures Procedures (including critical care time)  Medications Ordered in ED Medications  acetaminophen (TYLENOL) tablet 1,000 mg (1,000 mg Oral Given 12/05/19 1603)  sodium chloride 0.9 % bolus 1,000 mL (1,000 mLs Intravenous New Bag/Given 12/05/19 2241)  ketorolac (TORADOL) 30 MG/ML injection 30 mg (30 mg Intravenous Given 12/05/19 2239)  dexamethasone (DECADRON) injection 10 mg (10 mg Intravenous Given 12/05/19 2239)  albuterol (VENTOLIN HFA) 108 (90 Base) MCG/ACT inhaler 2 puff (2 puffs Inhalation Given 12/05/19 2221)    ED Course  I have reviewed the triage vital signs and the nursing  notes.  Pertinent labs & imaging results that were available during my care of the patient were reviewed by me and considered in my medical decision making (see chart for details).    MDM Rules/Calculators/A&P                          11:36 PM Patient awake, alert, vital signs have improved, no hypoxia, heart rate below 100, she states that she feels better.  When she has received Toradol, Tylenol, Decadron, albuterol. Patient does have opacification concerning for pneumonia consistent with Covid pneumonia. Absent hemodynamic instability, with improvement here, patient is appropriate for, amenable to outpatient follow-up.  Shaneil Ulloa was evaluated in Emergency Department on 12/05/2019 for the symptoms described in the history of present illness. She was evaluated in the context of the global COVID-19 pandemic, which necessitated consideration that the patient might be at risk for infection with the SARS-CoV-2 virus that causes COVID-19. Institutional protocols and algorithms that pertain to the evaluation of patients at risk for COVID-19 are in a state of rapid change based on information released by regulatory bodies including the CDC and federal and state organizations. These policies and algorithms were followed during the  patient's care in the ED.  Final Clinical Impression(s) / ED Diagnoses Final diagnoses:  Pneumonia due to COVID-19 virus    Rx / DC Orders ED Discharge Orders         Ordered    predniSONE (DELTASONE) 20 MG tablet  Daily with breakfast     Discontinue  Reprint     12/05/19 2335           Carmin Muskrat, MD 12/05/19 2337

## 2019-12-05 NOTE — Discharge Instructions (Signed)
As discussed for your coronavirus at his boarding care plenty of rest, drink plenty fluids, and usual albuterol every 4 hours for the next few days. Please be sure to follow-up with your physician. Return here for concerning changes in your condition.

## 2019-12-15 ENCOUNTER — Encounter (HOSPITAL_COMMUNITY): Payer: Self-pay | Admitting: Emergency Medicine

## 2019-12-15 ENCOUNTER — Emergency Department (HOSPITAL_COMMUNITY): Payer: BC Managed Care – PPO

## 2019-12-15 ENCOUNTER — Inpatient Hospital Stay (HOSPITAL_COMMUNITY)
Admission: EM | Admit: 2019-12-15 | Discharge: 2019-12-19 | DRG: 177 | Disposition: A | Discharge status: Home Health | Payer: BC Managed Care – PPO | Attending: Internal Medicine | Admitting: Internal Medicine

## 2019-12-15 DIAGNOSIS — R0602 Shortness of breath: Secondary | ICD-10-CM | POA: Diagnosis not present

## 2019-12-15 DIAGNOSIS — R042 Hemoptysis: Secondary | ICD-10-CM | POA: Diagnosis not present

## 2019-12-15 DIAGNOSIS — E876 Hypokalemia: Secondary | ICD-10-CM | POA: Diagnosis not present

## 2019-12-15 DIAGNOSIS — R0902 Hypoxemia: Secondary | ICD-10-CM | POA: Diagnosis not present

## 2019-12-15 DIAGNOSIS — Z79899 Other long term (current) drug therapy: Secondary | ICD-10-CM

## 2019-12-15 DIAGNOSIS — U071 COVID-19: Principal | ICD-10-CM | POA: Diagnosis present

## 2019-12-15 DIAGNOSIS — E221 Hyperprolactinemia: Secondary | ICD-10-CM | POA: Diagnosis not present

## 2019-12-15 DIAGNOSIS — E785 Hyperlipidemia, unspecified: Secondary | ICD-10-CM | POA: Diagnosis not present

## 2019-12-15 DIAGNOSIS — K861 Other chronic pancreatitis: Secondary | ICD-10-CM | POA: Diagnosis not present

## 2019-12-15 DIAGNOSIS — D649 Anemia, unspecified: Secondary | ICD-10-CM | POA: Diagnosis present

## 2019-12-15 DIAGNOSIS — J45909 Unspecified asthma, uncomplicated: Secondary | ICD-10-CM | POA: Diagnosis present

## 2019-12-15 DIAGNOSIS — J1282 Pneumonia due to coronavirus disease 2019: Secondary | ICD-10-CM | POA: Diagnosis not present

## 2019-12-15 DIAGNOSIS — Z7951 Long term (current) use of inhaled steroids: Secondary | ICD-10-CM

## 2019-12-15 DIAGNOSIS — K219 Gastro-esophageal reflux disease without esophagitis: Secondary | ICD-10-CM | POA: Diagnosis not present

## 2019-12-15 DIAGNOSIS — J80 Acute respiratory distress syndrome: Secondary | ICD-10-CM | POA: Diagnosis present

## 2019-12-15 DIAGNOSIS — J9601 Acute respiratory failure with hypoxia: Secondary | ICD-10-CM | POA: Diagnosis present

## 2019-12-15 DIAGNOSIS — R7989 Other specified abnormal findings of blood chemistry: Secondary | ICD-10-CM | POA: Diagnosis not present

## 2019-12-15 DIAGNOSIS — R918 Other nonspecific abnormal finding of lung field: Secondary | ICD-10-CM | POA: Diagnosis not present

## 2019-12-15 LAB — COMPREHENSIVE METABOLIC PANEL
ALT: 31 U/L (ref 0–44)
AST: 31 U/L (ref 15–41)
Albumin: 2.5 g/dL — ABNORMAL LOW (ref 3.5–5.0)
Alkaline Phosphatase: 74 U/L (ref 38–126)
Anion gap: 10 (ref 5–15)
BUN: 6 mg/dL (ref 6–20)
CO2: 27 mmol/L (ref 22–32)
Calcium: 8.2 mg/dL — ABNORMAL LOW (ref 8.9–10.3)
Chloride: 100 mmol/L (ref 98–111)
Creatinine, Ser: 0.92 mg/dL (ref 0.44–1.00)
GFR calc Af Amer: >60 mL/min (ref >60)
GFR calc non Af Amer: >60 mL/min (ref >60)
Glucose, Bld: 106 mg/dL — ABNORMAL HIGH (ref 70–99)
Potassium: 3.3 mmol/L — ABNORMAL LOW (ref 3.5–5.1)
Sodium: 137 mmol/L (ref 135–145)
Total Bilirubin: 0.7 mg/dL (ref 0.3–1.2)
Total Protein: 6.4 g/dL — ABNORMAL LOW (ref 6.5–8.1)

## 2019-12-15 LAB — C-REACTIVE PROTEIN: CRP: 32.3 mg/dL — ABNORMAL HIGH (ref <1.0)

## 2019-12-15 LAB — TYPE AND SCREEN
ABO/RH(D): O POS
Antibody Screen: NEGATIVE

## 2019-12-15 LAB — CBC WITH DIFFERENTIAL/PLATELET
Abs Immature Granulocytes: 0 10*3/uL (ref 0.00–0.07)
Basophils Absolute: 0 10*3/uL (ref 0.0–0.1)
Basophils Relative: 0 %
Eosinophils Absolute: 0.1 10*3/uL (ref 0.0–0.5)
Eosinophils Relative: 1 %
HCT: 35.4 % — ABNORMAL LOW (ref 36.0–46.0)
Hemoglobin: 11.6 g/dL — ABNORMAL LOW (ref 12.0–15.0)
Lymphocytes Relative: 11 %
Lymphs Abs: 0.9 10*3/uL (ref 0.7–4.0)
MCH: 27.3 pg (ref 26.0–34.0)
MCHC: 32.8 g/dL (ref 30.0–36.0)
MCV: 83.3 fL (ref 80.0–100.0)
Monocytes Absolute: 0.4 10*3/uL (ref 0.1–1.0)
Monocytes Relative: 5 %
Neutro Abs: 6.6 10*3/uL (ref 1.7–7.7)
Neutrophils Relative %: 83 %
Platelets: 328 10*3/uL (ref 150–400)
RBC: 4.25 MIL/uL (ref 3.87–5.11)
RDW: 13.5 % (ref 11.5–15.5)
WBC: 8 10*3/uL (ref 4.0–10.5)
nRBC: 0 % (ref 0.0–0.2)
nRBC: 0 /100 WBC

## 2019-12-15 LAB — LACTIC ACID, PLASMA
Lactic Acid, Venous: 1.3 mmol/L (ref 0.5–1.9)
Lactic Acid, Venous: 1.5 mmol/L (ref 0.5–1.9)

## 2019-12-15 LAB — LIPASE, BLOOD: Lipase: 27 U/L (ref 11–51)

## 2019-12-15 LAB — HIV ANTIBODY (ROUTINE TESTING W REFLEX): HIV Screen 4th Generation wRfx: NONREACTIVE

## 2019-12-15 LAB — FIBRINOGEN: Fibrinogen: 762 mg/dL — ABNORMAL HIGH (ref 210–475)

## 2019-12-15 LAB — TROPONIN I (HIGH SENSITIVITY)
Troponin I (High Sensitivity): 4 ng/L (ref <18)
Troponin I (High Sensitivity): 4 ng/L (ref <18)
Troponin I (High Sensitivity): 5 ng/L (ref <18)

## 2019-12-15 LAB — HEPATITIS B SURFACE ANTIGEN: Hepatitis B Surface Ag: NONREACTIVE

## 2019-12-15 LAB — PROCALCITONIN: Procalcitonin: 0.14 ng/mL

## 2019-12-15 LAB — D-DIMER, QUANTITATIVE: D-Dimer, Quant: 2.05 ug/mL-FEU — ABNORMAL HIGH (ref 0.00–0.50)

## 2019-12-15 LAB — I-STAT BETA HCG BLOOD, ED (MC, WL, AP ONLY): I-stat hCG, quantitative: <5 m[IU]/mL (ref <5)

## 2019-12-15 LAB — FERRITIN: Ferritin: 1360 ng/mL — ABNORMAL HIGH (ref 11–307)

## 2019-12-15 LAB — LACTATE DEHYDROGENASE: LDH: 387 U/L — ABNORMAL HIGH (ref 98–192)

## 2019-12-15 LAB — SARS CORONAVIRUS 2 BY RT PCR (HOSPITAL ORDER, PERFORMED IN ~~LOC~~ HOSPITAL LAB): SARS Coronavirus 2: POSITIVE — AB

## 2019-12-15 LAB — BRAIN NATRIURETIC PEPTIDE: B Natriuretic Peptide: 22.2 pg/mL (ref 0.0–100.0)

## 2019-12-15 LAB — ABO/RH: ABO/RH(D): O POS

## 2019-12-15 MED ORDER — ACETAMINOPHEN 325 MG PO TABS
650.0000 mg | ORAL_TABLET | Freq: Once | ORAL | Status: AC
  Administered 2019-12-15: 650 mg via ORAL
  Filled 2019-12-15: qty 2

## 2019-12-15 MED ORDER — SODIUM CHLORIDE 0.9% FLUSH
3.0000 mL | Freq: Two times a day (BID) | INTRAVENOUS | Status: DC
  Administered 2019-12-15 – 2019-12-19 (×8): 3 mL via INTRAVENOUS

## 2019-12-15 MED ORDER — DEXAMETHASONE SODIUM PHOSPHATE 10 MG/ML IJ SOLN
6.0000 mg | Freq: Once | INTRAMUSCULAR | Status: AC
  Administered 2019-12-15: 6 mg via INTRAVENOUS
  Filled 2019-12-15: qty 1

## 2019-12-15 MED ORDER — DEXAMETHASONE 6 MG PO TABS: 6.0000 mg | ORAL_TABLET | ORAL | Status: DC

## 2019-12-15 MED ORDER — PANCRELIPASE (LIP-PROT-AMYL) 12000-38000 UNITS PO CPEP
12000.0000 [IU] | ORAL_CAPSULE | Freq: Three times a day (TID) | ORAL | Status: DC | PRN
  Filled 2019-12-15: qty 1

## 2019-12-15 MED ORDER — SODIUM CHLORIDE 0.9 % IV BOLUS
500.0000 mL | Freq: Once | INTRAVENOUS | Status: AC
  Administered 2019-12-15: 500 mL via INTRAVENOUS

## 2019-12-15 MED ORDER — ENOXAPARIN SODIUM 40 MG/0.4ML ~~LOC~~ SOLN
40.0000 mg | SUBCUTANEOUS | Status: DC
  Administered 2019-12-15: 40 mg via SUBCUTANEOUS
  Filled 2019-12-15: qty 0.4

## 2019-12-15 MED ORDER — MOMETASONE FURO-FORMOTEROL FUM 100-5 MCG/ACT IN AERO
2.0000 | INHALATION_SPRAY | Freq: Every day | RESPIRATORY_TRACT | Status: DC
  Administered 2019-12-16 – 2019-12-19 (×4): 2 via RESPIRATORY_TRACT
  Filled 2019-12-15: qty 8.8

## 2019-12-15 MED ORDER — ASCORBIC ACID 500 MG PO TABS
500.0000 mg | ORAL_TABLET | Freq: Every day | ORAL | Status: DC
  Administered 2019-12-15 – 2019-12-19 (×5): 500 mg via ORAL
  Filled 2019-12-15 (×5): qty 1

## 2019-12-15 MED ORDER — ATORVASTATIN CALCIUM 10 MG PO TABS
10.0000 mg | ORAL_TABLET | Freq: Every day | ORAL | Status: DC
  Administered 2019-12-15 – 2019-12-18 (×4): 10 mg via ORAL
  Filled 2019-12-15 (×5): qty 1

## 2019-12-15 MED ORDER — PANCRELIPASE (LIP-PROT-AMYL) 12000-38000 UNITS PO CPEP
24000.0000 [IU] | ORAL_CAPSULE | Freq: Three times a day (TID) | ORAL | Status: DC
  Administered 2019-12-16 – 2019-12-19 (×11): 24000 [IU] via ORAL
  Filled 2019-12-15 (×12): qty 2

## 2019-12-15 MED ORDER — HYDROCOD POLST-CPM POLST ER 10-8 MG/5ML PO SUER
5.0000 mL | Freq: Two times a day (BID) | ORAL | Status: DC | PRN
  Administered 2019-12-16 – 2019-12-18 (×2): 5 mL via ORAL
  Filled 2019-12-15 (×2): qty 5

## 2019-12-15 MED ORDER — POTASSIUM CHLORIDE CRYS ER 20 MEQ PO TBCR
40.0000 meq | EXTENDED_RELEASE_TABLET | ORAL | Status: AC
  Administered 2019-12-15: 40 meq via ORAL
  Filled 2019-12-15: qty 2

## 2019-12-15 MED ORDER — IOHEXOL 350 MG/ML SOLN
75.0000 mL | Freq: Once | INTRAVENOUS | Status: AC | PRN
  Administered 2019-12-15: 75 mL via INTRAVENOUS

## 2019-12-15 MED ORDER — ALBUTEROL SULFATE HFA 108 (90 BASE) MCG/ACT IN AERS
2.0000 | INHALATION_SPRAY | Freq: Four times a day (QID) | RESPIRATORY_TRACT | Status: DC
  Administered 2019-12-15 – 2019-12-19 (×16): 2 via RESPIRATORY_TRACT
  Filled 2019-12-15: qty 6.7

## 2019-12-15 MED ORDER — SODIUM CHLORIDE 0.9 % IV SOLN
100.0000 mg | Freq: Every day | INTRAVENOUS | Status: AC
  Administered 2019-12-16 – 2019-12-19 (×4): 100 mg via INTRAVENOUS
  Filled 2019-12-15 (×4): qty 20

## 2019-12-15 MED ORDER — BROMOCRIPTINE MESYLATE 2.5 MG PO TABS
1.2500 mg | ORAL_TABLET | Freq: Every day | ORAL | Status: DC
  Administered 2019-12-15 – 2019-12-18 (×4): 1.25 mg via ORAL
  Filled 2019-12-15 (×5): qty 1

## 2019-12-15 MED ORDER — ALBUTEROL SULFATE HFA 108 (90 BASE) MCG/ACT IN AERS
2.0000 | INHALATION_SPRAY | Freq: Once | RESPIRATORY_TRACT | Status: DC
  Filled 2019-12-15: qty 6.7

## 2019-12-15 MED ORDER — FAMOTIDINE 20 MG PO TABS
20.0000 mg | ORAL_TABLET | Freq: Two times a day (BID) | ORAL | Status: DC
  Administered 2019-12-15 – 2019-12-19 (×8): 20 mg via ORAL
  Filled 2019-12-15 (×8): qty 1

## 2019-12-15 MED ORDER — GUAIFENESIN-DM 100-10 MG/5ML PO SYRP
10.0000 mL | ORAL_SOLUTION | ORAL | Status: DC | PRN
  Administered 2019-12-16 – 2019-12-18 (×2): 10 mL via ORAL
  Filled 2019-12-15 (×2): qty 10

## 2019-12-15 MED ORDER — ZINC SULFATE 220 (50 ZN) MG PO CAPS
220.0000 mg | ORAL_CAPSULE | Freq: Every day | ORAL | Status: DC
  Administered 2019-12-15 – 2019-12-19 (×5): 220 mg via ORAL
  Filled 2019-12-15 (×5): qty 1

## 2019-12-15 MED ORDER — PANCRELIPASE (LIP-PROT-AMYL) 24000-76000 UNITS PO CPEP: 2.0000 | ORAL_CAPSULE | ORAL | Status: DC

## 2019-12-15 MED ORDER — SODIUM CHLORIDE 0.9 % IV SOLN
200.0000 mg | Freq: Once | INTRAVENOUS | Status: AC
  Administered 2019-12-15: 200 mg via INTRAVENOUS
  Filled 2019-12-15: qty 40

## 2019-12-15 NOTE — ED Provider Notes (Signed)
Patient seen after prior ED provider.    Patient will require admission for Covid pneumonia given her oxygen requirement.  Patient understands plan of care and need for admission.  Hospitalist service is aware of case and will evaluate for admission.  Remdesivir and Decadron initiated while in the ED.      Valarie Merino, MD 12/15/19 1655

## 2019-12-15 NOTE — H&P (Addendum)
History and Physical    Beth [name] YTK:[id number] DOB: 04/12/1976 DOA: 12/15/2019  Referring MD/NP/PA: Davonna Belling, MD  PCP: Patient, No Pcp Per  Patient coming from: Home   Chief Complaint: Shortness of breath and cough  I have personally briefly reviewed patient's old medical records in [place]   HPI: Beth [name] is a 44 y.o. female with medical history significant of asthma, chronic pancreatitis, and prolactinoma presents with complaints of progressively worsening productive cough and shortness of breath.  Patient had initially been diagnosed with COVID-19 on June 11.  Reported associated symptoms of fever, chest discomfort with coughing, generalized malaise, nausea, vomiting, diarrhea, and loss of taste/smell.   Denies any headaches, loss of consciousness, abdominal pain, or dysuria.  Reports that she has been coughing so much that she is coughed up some blood.  Since she was initially diagnosed she had been staying at home, completed prednisone taper given, and tried to treat the symptoms with Alka-Seltzer without relief.    ED Course: Upon admission into the emergency department patient was seen to be febrile up to 102.6 F, tachycardic, and tachypneic with O2 saturations as low as 89% on room air.  O2 saturations improved on nasal cannula oxygen.  CT angiogram of the chest negative for any signs of a PE, but did note multifocal pneumonia.  Labs significant for hemoglobin 11.6, potassium 3.3, lactic acid 1.3, and D-dimer 2.05.  Patient was given 500 mL of normal saline IV fluids, 6 mg of Decadron,  and remdesivir.  TRH called to admit.   Review of Systems  Constitutional: Positive for diaphoresis, fever and malaise/fatigue.  HENT: Positive for congestion.   Eyes: Negative for photophobia and pain.  Respiratory: Positive for cough, hemoptysis, sputum production and shortness of breath. Negative for stridor.   Cardiovascular: Positive for chest pain. Negative for leg  swelling.  Gastrointestinal: Positive for abdominal pain, diarrhea, nausea and vomiting.  Genitourinary: Negative for dysuria and hematuria.  Musculoskeletal: Negative for falls.  Skin: Negative for itching.  Neurological: Positive for weakness. Negative for focal weakness and loss of consciousness.  Psychiatric/Behavioral: Negative for substance abuse. The patient has insomnia.     Past Medical History:  Diagnosis Date  . Asthma   . Chronic pancreatitis ([place])   . Hypoglycemia   . Prolactinoma [facility])     Past Surgical History:  Procedure Laterality Date  . CHOLECYSTECTOMY    . COLONOSCOPY       reports that she has never smoked. She has never used smokeless tobacco. She reports that she does not drink alcohol and does not use drugs.  Allergies  Allergen Reactions  . Dewey Swelling  . [place] Grass   . Cedar Swelling  . Cladosporium Cladosporioides   . Dust Mite Extract   . Elm Bark [Ulmus Fulva] Swelling  . Lidocaine Hcl Swelling  . Molds & Smuts   . Mugwort   . Nettle [Urtica Dioica]   . Sorrel-Dock Mix [Sheep Sorrel-Yellow Dock]   . Timothy Grass Pollen Allergen   . Xylocaine  [Lidocaine]     Family History  Problem Relation Age of Onset  . Other Paternal Aunt        pituitary surgery    Prior to Admission medications   Medication Sig Start Date End Date Taking? Authorizing Provider  albuterol (PROAIR HFA) 108 (90 Base) MCG/ACT inhaler Inhale 1 puff into the lungs as needed for wheezing or shortness of breath.     [provider]  atorvastatin (  LIPITOR) 10 MG tablet Take 10 mg by mouth daily.     [provider]  bromocriptine (PARLODEL) 2.5 MG tablet Take 0.5 tablets (1.25 mg total) by mouth at bedtime. 04/04/19   Renato Shin, MD  EPINEPHrine (EPIPEN 2-PAK) 0.3 mg/0.3 mL IJ SOAJ injection Inject 0.3 mg into the muscle once.     [provider]  hydrochlorothiazide (HYDRODIURIL) 12.5 MG tablet Take 12.5 mg by mouth daily. 03/20/19    [provider]  HYDROcodone-acetaminophen (NORCO/VICODIN) 5-325 MG tablet Take 1-2 tablets by mouth every 6 (six) hours as needed for severe pain.     [provider]  levonorgestrel (MIRENA) 20 MCG/24HR IUD 1 each by Intrauterine route once.     [provider]  meloxicam (MOBIC) 7.5 MG tablet Take 7.5 mg by mouth daily.     [provider]  mometasone-formoterol (DULERA) 100-5 MCG/ACT AERO Inhale 2 puffs into the lungs daily.     [provider]  naftifine (NAFTIN) 1 % cream Apply 1 application topically daily.  11/09/16   [provider]  naproxen sodium (ALEVE) 220 MG tablet Take 220 mg by mouth daily as needed (pain).     [provider]  omega-3 fish oil (MAXEPA) 1000 MG CAPS capsule Take 2 capsules by mouth daily.     [provider]  ondansetron (ZOFRAN-ODT) 4 MG disintegrating tablet Take 4 mg by mouth every 8 (eight) hours as needed for nausea or vomiting.     [provider]  Pancrelipase, Lip-Prot-Amyl, (CREON) 24000-76000 units CPEP Take 2 capsules by mouth See admin instructions. Take 2 capsules three times daily with food and 1 capsule twice daily with snacks. 04/20/17   [provider]  pantoprazole (PROTONIX) 20 MG tablet Take 20 mg by mouth daily. 11/25/19   [provider]  predniSONE (DELTASONE) 20 MG tablet Take 2 tablets (40 mg total) by mouth daily with breakfast. For the next four days 12/05/19   Carmin Muskrat, MD  tizanidine (ZANAFLEX) 2 MG capsule Take 2 mg by mouth 3 (three) times daily as needed for muscle spasms.     [provider]    Physical Exam:  Constitutional: middle aged female moderate respiratory distress Vitals:   12/15/19 1053 12/15/19 1055 12/15/19 1249  BP: 111/68    Pulse: (!) 102    Resp: (!) 22    Temp: 99.9 F (37.7 C)  (!) 102.6 F (39.2 C)  TempSrc: Oral  Rectal  SpO2: (!) 89%    Weight:  117.9 kg   Height:  5\' 9"  (1.753 m)     Eyes: PERRL, lids and conjunctivae normal ENMT: Mucous membranes are moist. Posterior pharynx clear of any exudate or lesions.  Neck: normal, supple, no masses, no thyromegaly Respiratory: Tachypneic with decreased overall aeration and positive crackles heard at the bases.  Patient currently on 4 L nasal cannula oxygen with O2 saturations maintained around 92%. Cardiovascular: Regular rate and rhythm, no murmurs / rubs / gallops. No extremity edema. 2+ pedal pulses. No carotid bruits.  Abdomen: no tenderness, no masses palpated. No hepatosplenomegaly. Bowel sounds positive.  Musculoskeletal: no clubbing / cyanosis. No joint deformity upper and lower extremities. Good ROM, no contractures. Normal muscle tone.  Skin: no rashes, lesions, ulcers. No induration Neurologic: CN 2-12 grossly intact. Sensation intact, DTR normal. Strength 5/5 in all 4.  Psychiatric: Normal judgment and insight. Alert and oriented x 3. Normal mood.     Labs on Admission: I have personally reviewed  following labs and imaging studies  CBC: Recent Labs  Lab 12/15/19 1222  WBC 8.0  NEUTROABS 6.6  HGB 11.6*  HCT 35.4*  MCV 83.3  PLT 086   Basic Metabolic Panel: Recent Labs  Lab 12/15/19 1222  NA 137  K 3.3*  CL 100  CO2 27  GLUCOSE 106*  BUN 6  CREATININE 0.92  CALCIUM 8.2*   GFR: Estimated Creatinine Clearance: 108.2 mL/min (by C-G formula based on SCr of 0.92 mg/dL). Liver Function Tests: Recent Labs  Lab 12/15/19 1222  AST 31  ALT 31  ALKPHOS 74  BILITOT 0.7  PROT 6.4*  ALBUMIN 2.5*   Recent Labs  Lab 12/15/19 1222  LIPASE 27   No results for input(s): AMMONIA in the last 168 hours. Coagulation Profile: No results for input(s): INR, PROTIME in the last 168 hours. Cardiac Enzymes: No results for input(s): CKTOTAL, CKMB, CKMBINDEX, TROPONINI in the last 168 hours. BNP (last 3 results) No results for input(s): PROBNP in the last 8760 hours. HbA1C: No results for input(s): HGBA1C  in the last 72 hours. CBG: No results for input(s): GLUCAP in the last 168 hours. Lipid Profile: No results for input(s): CHOL, HDL, LDLCALC, TRIG, CHOLHDL, LDLDIRECT in the last 72 hours. Thyroid Function Tests: No results for input(s): TSH, T4TOTAL, FREET4, T3FREE, THYROIDAB in the last 72 hours. Anemia Panel: No results for input(s): VITAMINB12, FOLATE, FERRITIN, TIBC, IRON, RETICCTPCT in the last 72 hours. Urine analysis: No results found for: COLORURINE, APPEARANCEUR, LABSPEC, PHURINE, GLUCOSEU, HGBUR, BILIRUBINUR, KETONESUR, PROTEINUR, UROBILINOGEN, NITRITE, LEUKOCYTESUR Sepsis Labs: Recent Results (from the past 240 hour(s))  SARS Coronavirus 2 by RT PCR (hospital order, performed in [facility] hospital lab) Nasopharyngeal Nasopharyngeal Swab     Status: Abnormal   Collection Time: 12/15/19  1:05 PM   Specimen: Nasopharyngeal Swab  Result Value Ref Range Status   SARS Coronavirus 2 POSITIVE (A) NEGATIVE Final    Comment: RESULT CALLED TO, READ BACK BY AND VERIFIED WITH: M. Ferdinand Lango RN 15:45 12/15/19 ([email or web address]) (NOTE) SARS-CoV-2 target nucleic acids are DETECTED  SARS-CoV-2 RNA is generally detectable in upper respiratory specimens  during the acute phase of infection.  Positive results are indicative  of the presence of the identified virus, but do not rule out bacterial infection or co-infection with other pathogens not detected by the test.  Clinical correlation with patient history and  other diagnostic information is necessary to determine patient infection status.  The expected result is negative.  Fact Sheet for Patients:   StrictlyIdeas.no   Fact Sheet for Healthcare Providers:   BankingDealers.co.za    This test is not yet approved or cleared by the [place] FDA and  has been authorized for detection and/or diagnosis of SARS-CoV-2 by FDA under an Emergency Use Authorization (EUA).  This EUA will remain in effect  (meaning this  test can be used) for the duration of  the COVID-19 declaration under Section 564(b)(1) of the Act, 21 U.S.C. section 360-bbb-3(b)(1), unless the authorization is terminated or revoked sooner.  Performed at [facility], [place] [address] [place], [place] [address]      Radiological Exams on Admission: CT Angio Chest PE W and/or Wo Contrast  Result Date: 12/15/2019 CLINICAL DATA:  Shortness of breath and weakness. EXAM: CT ANGIOGRAPHY CHEST WITH CONTRAST TECHNIQUE: Multidetector CT imaging of the chest was performed using the standard protocol during bolus administration of intravenous contrast. Multiplanar CT image reconstructions and MIPs were obtained to evaluate the vascular anatomy.  CONTRAST:  6mL OMNIPAQUE IOHEXOL 350 MG/ML SOLN COMPARISON:  None. FINDINGS: Cardiovascular: Satisfactory opacification of the pulmonary arteries to the segmental level. No evidence of pulmonary embolism. Normal heart size. No pericardial effusion. Mediastinum/Nodes: A 4.4 cm x 3.1 cm heterogeneous soft tissue mass is seen along the paratracheal region on the right. This extends along the posterior aspect of the right hilum to the subcarinal region. Lungs/Pleura: Marked severity diffuse bilateral infiltrates are seen. Very small bilateral pleural effusions are noted. There is no evidence of a pneumothorax. Upper Abdomen: No acute abnormality. Musculoskeletal: No chest wall abnormality. No acute or significant osseous findings. Review of the MIP images confirms the above findings. IMPRESSION: 1. Marked severity diffuse bilateral infiltrates. 2. Very small bilateral pleural effusions. 3. Heterogeneous right-sided paratracheal soft tissue mass which is worrisome for the presence of an underlying neoplasm. Electronically Signed   By: Virgina Norfolk M.D.   On: 12/15/2019 16:13   DG Chest Portable 1 View  Result Date: 12/15/2019 CLINICAL DATA:  COVID-19 positive, cough, shortness of breath, chest  pain, hypoxia EXAM: PORTABLE CHEST 1 VIEW COMPARISON:  Portable exam 1230 hours compared to 12/05/2019 FINDINGS: Upper normal heart size. Mediastinal contours and pulmonary vascularity normal. Patchy airspace infiltrates bilaterally in the mid to lower lungs consistent with multifocal pneumonia significantly increased from prior study. No pleural effusion or pneumothorax. Osseous structures unremarkable. IMPRESSION: Significantly increased BILATERAL pulmonary infiltrates in the mid to lower lungs consistent with multifocal pneumonia. Electronically Signed   By: Lavonia Dana M.D.   On: 12/15/2019 13:09    EKG: Independently reviewed.  Sinus tachycardia 108 bpm  Assessment/Plan  Acute respiratory failure with hypoxia secondary to pneumonia due to COVID-19: Patient presents with progressively worsening cough and shortness of breath.  Initially diagnosed with COVID-19 on June 11.  Chest x-ray noting bilateral infiltrate concerning for multifocal pneumonia.  Patient was noted to meet SIRS criteria, but it was noted to be reassuring. -Admit to a progressive bed -COVID-19 order set utilized -Continuous pulse oximetry with nasal cannula oxygen to maintain O2 saturations greater 90% -Follow-up blood cultures -Continue remdesivir  -Continue Decadron 6 mg daily -Albuterol inhaler -Antitussives as needed -Vitamin C and zinc -Follow-up pending inflammatory markers -Consider starting antibiotics if prolactin elevated  Asthma: Patient without wheezing noted on physical exam.  She is supposed to be on daily maintenance of Dulera with albuterol as needed for wheezing.  Patient ports that she has not been regularly using her Dulera. -Continue Dulera  Hypokalemia: Acute.  Potassium 3.3 on admission. -Give 40 mEq of potassium chloride p.o. 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 -Will need further work-up once acute issues resolved  Normocytic anemia: Hemoglobin 11.6 g/dL.  Patient reports coughing up some blood tinged sputum. -Continue to monitor  Prolactinoma: Home medications include bromocriptine 1.25 mg daily. -Continue bromocriptine  Chronic pancreatitis -Continue pancrelipase  Hyperlipidemia -Continue atorvastatin   GI prophylaxis: Pepcid DVT prophylaxis: Lovenox Code Status: Full Family Communication: Discussed plan of care with patient's mother over the phone as requested Disposition Plan: To be determined Consults called: none  Admission status: Inpatient  Norval Morton MD [facility] Pager [phone]   If 7PM-7AM, please contact night-coverage [email or web address] Password [facility]  12/15/2019, 4:52 PM

## 2019-12-15 NOTE — ED Notes (Signed)
MD notified of elevated temp

## 2019-12-15 NOTE — ED Provider Notes (Signed)
[facility] EMERGENCY DEPARTMENT Provider Note   CSN: [id number] Arrival date & time: 12/15/19  1047     History Chief Complaint  Patient presents with  . Shortness of Breath  . Weakness  . Emesis    Beth [name] is a 44 y.o. female.  The history is provided by the patient and medical records. No language interpreter was used.  Shortness of Breath Severity:  Severe Onset quality:  Gradual Duration:  1 week Timing:  Constant Chronicity:  New Context: URI   Relieved by:  Nothing Worsened by:  Coughing Ineffective treatments:  None tried Associated symptoms: chest pain, cough, fever, hemoptysis, sputum production, vomiting and wheezing   Associated symptoms: no abdominal pain, no diaphoresis, no headaches, no neck pain and no rash        Past Medical History:  Diagnosis Date  . Asthma   . Chronic pancreatitis ([place])   . Hypoglycemia   . Prolactinoma [facility])     Patient Active Problem List   Diagnosis Date Noted  . Chronic pancreatitis ([place])   . Hyperprolactinemia ([place]) 02/15/2018    Past Surgical History:  Procedure Laterality Date  . CHOLECYSTECTOMY    . COLONOSCOPY       OB History   No obstetric history on file.     Family History  Problem Relation Age of Onset  . Other Paternal Aunt        pituitary surgery    Social History   Tobacco Use  . Smoking status: Never Smoker  . Smokeless tobacco: Never Used  Substance Use Topics  . Alcohol use: Never  . Drug use: Never    Home Medications Prior to Admission medications   Medication Sig Start Date End Date Taking? Authorizing Provider  albuterol (PROAIR HFA) 108 (90 Base) MCG/ACT inhaler Inhale 1 puff into the lungs as needed.    [provider]  atorvastatin (LIPITOR) 10 MG tablet Take by mouth.    [provider]  bromocriptine (PARLODEL) 2.5 MG tablet Take 0.5 tablets (1.25 mg total) by mouth at bedtime. 04/04/19   Renato Shin, MD  EPINEPHrine (EPIPEN  2-PAK) 0.3 mg/0.3 mL IJ SOAJ injection Inject into the muscle once.    [provider]  hydrochlorothiazide (HYDRODIURIL) 12.5 MG tablet Take 12.5 mg by mouth daily. 03/20/19   [provider]  HYDROcodone-acetaminophen (NORCO/VICODIN) 5-325 MG tablet Take by mouth.    [provider]  levonorgestrel (MIRENA) 20 MCG/24HR IUD by Intrauterine route.    [provider]  meloxicam (MOBIC) 7.5 MG tablet Take by mouth.    [provider]  mometasone-formoterol (DULERA) 100-5 MCG/ACT AERO Inhale into the lungs.    [provider]  naftifine (NAFTIN) 1 % cream Apply topically. 11/09/16   [provider]  naproxen sodium (ALEVE) 220 MG tablet Take 220 mg by mouth.    [provider]  omega-3 fish oil (MAXEPA) 1000 MG CAPS capsule Take 2 capsules by mouth.    [provider]  ondansetron (ZOFRAN-ODT) 4 MG disintegrating tablet ondansetron 4 mg disintegrating tablet    [provider]  Pancrelipase, Lip-Prot-Amyl, (CREON) 24000-76000 units CPEP TK 2 CS PO TID WITH MEALS AND 1 BID WITH SNACKS. MAX OF 8 PER DAY. 04/20/17   [provider]  predniSONE (DELTASONE) 20 MG tablet Take 2 tablets (40 mg total) by mouth daily with breakfast. For the next four days 12/05/19   Carmin Muskrat, MD  tizanidine (ZANAFLEX) 2 MG capsule Take by  mouth.    [provider]    Allergies    [place], [place] grass, Cedar, Cladosporium cladosporioides, Dust mite extract, Elm bark [ulmus fulva], Lidocaine hcl, Molds & smuts, Mugwort, Nettle [urtica dioica], Sorrel-dock mix [sheep sorrel-yellow dock], Timothy grass pollen allergen, and Xylocaine  [lidocaine]  Review of Systems   Review of Systems  Constitutional: Positive for chills, fatigue and fever. Negative for diaphoresis.  HENT: Positive for congestion.   Respiratory: Positive for cough, hemoptysis, sputum production, chest tightness, shortness of breath and wheezing.  Negative for stridor.   Cardiovascular: Positive for chest pain. Negative for palpitations and leg swelling.  Gastrointestinal: Positive for diarrhea, nausea and vomiting. Negative for abdominal pain and constipation.  Genitourinary: Negative for dysuria.  Musculoskeletal: Negative for back pain, neck pain and neck stiffness.  Skin: Negative for rash and wound.  Neurological: Negative for light-headedness and headaches.  Psychiatric/Behavioral: Negative for agitation.  All other systems reviewed and are negative.   Physical Exam Updated Vital Signs BP 111/68   Pulse (!) 102   Temp 99.9 F (37.7 C) (Oral)   Resp (!) 22   Ht 5\' 9"  (1.753 m)   Wt 117.9 kg   SpO2 (!) 89%   BMI 38.40 kg/m   Physical Exam Vitals and nursing note reviewed.  Constitutional:      General: She is not in acute distress.    Appearance: She is well-developed. She is ill-appearing. She is not toxic-appearing or diaphoretic.  HENT:     Head: Normocephalic and atraumatic.     Right Ear: External ear normal.     Left Ear: External ear normal.     Nose: Nose normal.     Mouth/Throat:     Pharynx: Oropharynx is clear. No pharyngeal swelling or oropharyngeal exudate.  Eyes:     Extraocular Movements: Extraocular movements intact.     Conjunctiva/sclera: Conjunctivae normal.     Pupils: Pupils are equal, round, and reactive to light.  Cardiovascular:     Rate and Rhythm: Tachycardia present.     Heart sounds: No murmur heard.   Pulmonary:     Effort: Tachypnea present. No respiratory distress.     Breath sounds: No stridor. Rhonchi present. No wheezing or rales.  Chest:     Chest wall: Tenderness present.  Abdominal:     General: There is no distension.     Tenderness: There is no abdominal tenderness. There is no rebound.  Musculoskeletal:     Cervical back: Normal range of motion and neck supple.     Right lower leg: No tenderness. No edema.     Left lower leg: No tenderness. No edema.  Skin:     General: Skin is warm.     Findings: No erythema or rash.  Neurological:     General: No focal deficit present.     Mental Status: She is alert and oriented to person, place, and time.     Motor: No abnormal muscle tone.     Coordination: Coordination normal.     Deep Tendon Reflexes: Reflexes are normal and symmetric.     ED Results / Procedures / Treatments   Labs (all labs ordered are listed, but only abnormal results are displayed) Labs Reviewed  SARS CORONAVIRUS 2 BY RT PCR (HOSPITAL ORDER, [facility]) - Abnormal; Notable for the following components:      Result Value   SARS Coronavirus 2 POSITIVE (*)    All other components within normal  limits  CBC WITH DIFFERENTIAL/PLATELET - Abnormal; Notable for the following components:   Hemoglobin 11.6 (*)    HCT 35.4 (*)    All other components within normal limits  COMPREHENSIVE METABOLIC PANEL - Abnormal; Notable for the following components:   Potassium 3.3 (*)    Glucose, Bld 106 (*)    Calcium 8.2 (*)    Total Protein 6.4 (*)    Albumin 2.5 (*)    All other components within normal limits  D-DIMER, QUANTITATIVE (NOT AT [facility]) - Abnormal; Notable for the following components:   D-Dimer, Quant 2.05 (*)    All other components within normal limits  CULTURE, BLOOD (ROUTINE X 2)  CULTURE, BLOOD (ROUTINE X 2)  LACTIC ACID, PLASMA  LACTIC ACID, PLASMA  LIPASE, BLOOD  I-STAT BETA HCG BLOOD, ED ([facility], [facility], [facility] ONLY)  TROPONIN I (HIGH SENSITIVITY)  TROPONIN I (HIGH SENSITIVITY)    EKG EKG Interpretation  Date/Time:  Monday December 15 2019 10:53:50 EDT Ventricular Rate:  108 PR Interval:  152 QRS Duration: 74 QT Interval:  332 QTC Calculation: 444 R Axis:   18 Text Interpretation: Sinus tachycardia Cannot rule out Anterior infarct , age undetermined T wave abnormality, consider inferior ischemia Abnormal ECG agree. no STEMI. NO OLD COMPARISON Confirmed by Charlesetta Shanks [phone]) on 12/15/2019 11:05:55  AM   Radiology CT Angio Chest PE W and/or Wo Contrast  Result Date: 12/15/2019 CLINICAL DATA:  Shortness of breath and weakness. EXAM: CT ANGIOGRAPHY CHEST WITH CONTRAST TECHNIQUE: Multidetector CT imaging of the chest was performed using the standard protocol during bolus administration of intravenous contrast. Multiplanar CT image reconstructions and MIPs were obtained to evaluate the vascular anatomy. CONTRAST:  64mL OMNIPAQUE IOHEXOL 350 MG/ML SOLN COMPARISON:  None. FINDINGS: Cardiovascular: Satisfactory opacification of the pulmonary arteries to the segmental level. No evidence of pulmonary embolism. Normal heart size. No pericardial effusion. Mediastinum/Nodes: A 4.4 cm x 3.1 cm heterogeneous soft tissue mass is seen along the paratracheal region on the right. This extends along the posterior aspect of the right hilum to the subcarinal region. Lungs/Pleura: Marked severity diffuse bilateral infiltrates are seen. Very small bilateral pleural effusions are noted. There is no evidence of a pneumothorax. Upper Abdomen: No acute abnormality. Musculoskeletal: No chest wall abnormality. No acute or significant osseous findings. Review of the MIP images confirms the above findings. IMPRESSION: 1. Marked severity diffuse bilateral infiltrates. 2. Very small bilateral pleural effusions. 3. Heterogeneous right-sided paratracheal soft tissue mass which is worrisome for the presence of an underlying neoplasm. Electronically Signed   By: Virgina Norfolk M.D.   On: 12/15/2019 16:13   DG Chest Portable 1 View  Result Date: 12/15/2019 CLINICAL DATA:  COVID-19 positive, cough, shortness of breath, chest pain, hypoxia EXAM: PORTABLE CHEST 1 VIEW COMPARISON:  Portable exam 1230 hours compared to 12/05/2019 FINDINGS: Upper normal heart size. Mediastinal contours and pulmonary vascularity normal. Patchy airspace infiltrates bilaterally in the mid to lower lungs consistent with multifocal pneumonia significantly  increased from prior study. No pleural effusion or pneumothorax. Osseous structures unremarkable. IMPRESSION: Significantly increased BILATERAL pulmonary infiltrates in the mid to lower lungs consistent with multifocal pneumonia. Electronically Signed   By: Lavonia Dana M.D.   On: 12/15/2019 13:09    Procedures Procedures (including critical care time)  Lolly Glaab was evaluated in Emergency Department on 12/15/2019 for the symptoms described in the history of present illness. She was evaluated in the context of the global COVID-19 pandemic, which necessitated consideration that the patient  might be at risk for infection with the SARS-CoV-2 virus that causes COVID-19. Institutional protocols and algorithms that pertain to the evaluation of patients at risk for COVID-19 are in a state of rapid change based on information released by regulatory bodies including the CDC and federal and state organizations. These policies and algorithms were followed during the patient's care in the ED.  CRITICAL CARE Performed by: Gwenyth Allegra Keilen Kahl Total critical care time: 25 minutes Critical care time was exclusive of separately billable procedures and treating other patients. Critical care was necessary to treat or prevent imminent or life-threatening deterioration. Critical care was time spent personally by me on the following activities: development of treatment plan with patient and/or surrogate as well as nursing, discussions with consultants, evaluation of patient's response to treatment, examination of patient, obtaining history from patient or surrogate, ordering and performing treatments and interventions, ordering and review of laboratory studies, ordering and review of radiographic studies, pulse oximetry and re-evaluation of patient's condition.    Medications Ordered in ED Medications  sodium chloride 0.9 % bolus 500 mL (has no administration in time range)  albuterol (VENTOLIN HFA) 108 (90  Base) MCG/ACT inhaler 2 puff (has no administration in time range)  iohexol (OMNIPAQUE) 350 MG/ML injection 75 mL (75 mLs Intravenous Contrast Given 12/15/19 1553)    ED Course  I have reviewed the triage vital signs and the nursing notes.  Pertinent labs & imaging results that were available during my care of the patient were reviewed by me and considered in my medical decision making (see chart for details).    MDM Rules/Calculators/A&P                          Beth [name] is a 44 y.o. female with a past medical history significant for asthma and recent diagnosis of COVID-19 who presents with worsening shortness of breath, chest discomfort, fatigue, chills, malaise, and productive cough.  Patient reports that she was diagnosed with COVID-19 infection a week and a half ago and since that time has been getting worse.  She reports that she does not take oxygen at home and was found to be hypoxic in the 80s on arrival.  On my nose evaluation, patient was satting 89% on 2 L, increased her to 4 L.  She is now satting in the low 90s.  Patient is complaining of moderate chest discomfort and shortness of breath.  She reports she has some wheezing but is not albuterol to help.  She reports nausea, vomiting, diarrhea and is feeling dehydrated and fatigued and lightheaded.  She reports that both of her children have been diagnosed with COVID-19 likely from school.  She otherwise denies any urinary symptoms.  She denies other complaints.  On exam, she is breathing approximately 45-50 times a minute.  Increased work of breathing.  Oxygen increased with some improvement.  Lungs had coarseness and wheezing bilaterally.  Chest was tender to palpation.  Abdomen nontender.  Patient appears ill.  She is tachycardic and tachypneic.  She is afebrile orally at 99.9, will get rectal temp.  Clinically I am concerned about COVID-19 infection or even pulmonary embolism given the hemoptysis which she is reporting with a  productive cough.  We will get screening labs including a D-dimer to see if she needs a PE study.  Anticipate admission given the new oxygen requirement and the patient's ill appearance.  Care transferred to Dr. Francia Greaves while waiting for CT results.  She will be admitted after CT is completed and PE is ruled out.    Final Clinical Impression(s) / ED Diagnoses Final diagnoses:  Shortness of breath  Hypoxia  COVID-19    Clinical Impression: 1. Shortness of breath   2. Hypoxia   3. COVID-19     Disposition: Admit  This note was prepared with assistance of Dragon voice recognition software. Occasional wrong-word or sound-a-like substitutions may have occurred due to the inherent limitations of voice recognition software.      Iline Buchinger, Gwenyth Allegra, MD 12/15/19 1622

## 2019-12-15 NOTE — ED Triage Notes (Signed)
Pt. Stated, Beth [name] been having SOB with weakness, N/V for 2 weeks. I tested positive COVID on December 05, 2019.

## 2019-12-16 ENCOUNTER — Encounter (HOSPITAL_COMMUNITY): Payer: Self-pay | Admitting: Internal Medicine

## 2019-12-16 ENCOUNTER — Other Ambulatory Visit: Payer: Self-pay

## 2019-12-16 LAB — PHOSPHORUS: Phosphorus: 1.9 mg/dL — ABNORMAL LOW (ref 2.5–4.6)

## 2019-12-16 LAB — MAGNESIUM: Magnesium: 2.6 mg/dL — ABNORMAL HIGH (ref 1.7–2.4)

## 2019-12-16 LAB — CBC WITH DIFFERENTIAL/PLATELET
Abs Immature Granulocytes: 0.07 10*3/uL (ref 0.00–0.07)
Basophils Absolute: 0 10*3/uL (ref 0.0–0.1)
Basophils Relative: 0 %
Eosinophils Absolute: 0 10*3/uL (ref 0.0–0.5)
Eosinophils Relative: 0 %
HCT: 34.1 % — ABNORMAL LOW (ref 36.0–46.0)
Hemoglobin: 11.4 g/dL — ABNORMAL LOW (ref 12.0–15.0)
Immature Granulocytes: 1 %
Lymphocytes Relative: 10 %
Lymphs Abs: 0.7 10*3/uL (ref 0.7–4.0)
MCH: 28.1 pg (ref 26.0–34.0)
MCHC: 33.4 g/dL (ref 30.0–36.0)
MCV: 84 fL (ref 80.0–100.0)
Monocytes Absolute: 0.4 10*3/uL (ref 0.1–1.0)
Monocytes Relative: 6 %
Neutro Abs: 5.8 10*3/uL (ref 1.7–7.7)
Neutrophils Relative %: 83 %
Platelets: 347 10*3/uL (ref 150–400)
RBC: 4.06 MIL/uL (ref 3.87–5.11)
RDW: 13.7 % (ref 11.5–15.5)
WBC: 7 10*3/uL (ref 4.0–10.5)
nRBC: 0 % (ref 0.0–0.2)

## 2019-12-16 LAB — LACTATE DEHYDROGENASE: LDH: 336 U/L — ABNORMAL HIGH (ref 98–192)

## 2019-12-16 LAB — COMPREHENSIVE METABOLIC PANEL
ALT: 28 U/L (ref 0–44)
AST: 27 U/L (ref 15–41)
Albumin: 2.3 g/dL — ABNORMAL LOW (ref 3.5–5.0)
Alkaline Phosphatase: 71 U/L (ref 38–126)
Anion gap: 9 (ref 5–15)
BUN: 9 mg/dL (ref 6–20)
CO2: 28 mmol/L (ref 22–32)
Calcium: 8.2 mg/dL — ABNORMAL LOW (ref 8.9–10.3)
Chloride: 101 mmol/L (ref 98–111)
Creatinine, Ser: 0.97 mg/dL (ref 0.44–1.00)
GFR calc Af Amer: >60 mL/min (ref >60)
GFR calc non Af Amer: >60 mL/min (ref >60)
Glucose, Bld: 157 mg/dL — ABNORMAL HIGH (ref 70–99)
Potassium: 3.9 mmol/L (ref 3.5–5.1)
Sodium: 138 mmol/L (ref 135–145)
Total Bilirubin: 0.5 mg/dL (ref 0.3–1.2)
Total Protein: 6.7 g/dL (ref 6.5–8.1)

## 2019-12-16 LAB — FERRITIN: Ferritin: 1272 ng/mL — ABNORMAL HIGH (ref 11–307)

## 2019-12-16 LAB — BRAIN NATRIURETIC PEPTIDE: B Natriuretic Peptide: 13.5 pg/mL (ref 0.0–100.0)

## 2019-12-16 LAB — PROCALCITONIN: Procalcitonin: 0.12 ng/mL

## 2019-12-16 LAB — C-REACTIVE PROTEIN: CRP: 33.4 mg/dL — ABNORMAL HIGH

## 2019-12-16 LAB — D-DIMER, QUANTITATIVE: D-Dimer, Quant: 2.7 ug{FEU}/mL — ABNORMAL HIGH (ref 0.00–0.50)

## 2019-12-16 MED ORDER — METHYLPREDNISOLONE SODIUM SUCC 125 MG IJ SOLR
60.0000 mg | Freq: Two times a day (BID) | INTRAMUSCULAR | Status: DC
  Administered 2019-12-16 – 2019-12-19 (×7): 60 mg via INTRAVENOUS
  Filled 2019-12-16 (×7): qty 2

## 2019-12-16 MED ORDER — LACTATED RINGERS IV SOLN: INTRAVENOUS | Status: DC

## 2019-12-16 MED ORDER — ENOXAPARIN SODIUM 40 MG/0.4ML ~~LOC~~ SOLN
40.0000 mg | Freq: Two times a day (BID) | SUBCUTANEOUS | Status: DC
  Administered 2019-12-16 – 2019-12-17 (×4): 40 mg via SUBCUTANEOUS
  Filled 2019-12-16 (×4): qty 0.4

## 2019-12-16 MED ORDER — OMEGA-3-ACID ETHYL ESTERS 1 G PO CAPS
2.0000 | ORAL_CAPSULE | Freq: Every day | ORAL | Status: DC
  Administered 2019-12-16 – 2019-12-19 (×4): 2 g via ORAL
  Filled 2019-12-16 (×4): qty 2

## 2019-12-16 MED ORDER — PANTOPRAZOLE SODIUM 20 MG PO TBEC
20.0000 mg | DELAYED_RELEASE_TABLET | Freq: Every day | ORAL | Status: DC
  Administered 2019-12-16 – 2019-12-17 (×2): 20 mg via ORAL
  Filled 2019-12-16 (×2): qty 1

## 2019-12-16 NOTE — Progress Notes (Signed)
PROGRESS NOTE                                                                                                                                                                                                             Patient Demographics:    Beth [name], is a 44 y.o. female, DOB - 23-Jun-1976, JPE:[id number]  Outpatient Primary MD for the patient is Patient, No Pcp Per    LOS - 1  Admit date - 12/15/2019    Chief Complaint  Patient presents with  . Shortness of Breath  . Weakness  . Emesis       Brief Narrative - Beth [name] is a 44 y.o. female with medical history significant of asthma, chronic pancreatitis, and prolactinoma presents with complaints of progressively worsening productive cough and shortness of breath, she was diagnosed with acute hypoxic respiratory failure due to COVID-19 pneumonia and admitted to the hospital.   Subjective:    Beth [name] today has, No headache, No chest pain, No abdominal pain - No Nausea, No new weakness tingling or numbness, mild cough and shortness of breath   Assessment  & Plan :      1. Acute Hypoxic Resp. Failure due to Acute Covid 19 Viral Pneumonitis during the ongoing 2020 Covid 19 Pandemic - she severe disease it has been placed on high-dose IV steroids and remdesivir.  Currently on supplemental oxygen 4 L, will monitor closely if she gets any worse we will use Actemra as she has already consented for it.  Encouraged the patient to sit up in chair in the daytime use I-S and flutter valve for pulmonary toiletry and then prone in bed when at night.  Will advance activity and titrate down oxygen as possible.  Actemra off label use - patient was told that if COVID-19 pneumonitis gets worse we might potentially use Actemra off label, patient denies any known history of active diverticulitis, tuberculosis or hepatitis, understands the risks and benefits and wants to  proceed with Actemra treatment if required.    SpO2: 96 % O2 Flow Rate (L/min): 4 L/min  Recent Labs  Lab 12/15/19 1222 12/15/19 1305 12/15/19 2037 12/16/19 0325  CRP  --   --  32.3* 33.4*  DDIMER 2.05*  --   --  2.70*  BNP  --   --  22.2 13.5  PROCALCITON  --   --  0.14 0.12  SARSCOV2NAA  --  POSITIVE*  --   --     Hepatic Function Latest Ref Rng & Units 12/16/2019 12/15/2019  Total Protein 6.5 - 8.1 g/dL 6.7 6.4(L)  Albumin 3.5 - 5.0 g/dL 2.3(L) 2.5(L)  AST 15 - 41 U/L 27 31  ALT 0 - 44 U/L 28 31  Alk Phosphatase 38 - 126 U/L 71 74  Total Bilirubin 0.3 - 1.2 mg/dL 0.5 0.7     2. Heterogeneous right-sided paratracheal soft tissue mass which is worrisome for the presence of an underlying neoplasm - incidentally on CT scan chest, will need her outpatient ENT work-up.  3.  Pancreatitis.  Stable no acute issues.  Continue chronic Creon supplementation.   4.  Dyslipidemia.  Continue statin.  5.  GERD.  PPI.   Condition -   Guarded  Family Communication  :  None  Code Status :  Full  Consults  :    Procedures  :   CT -  1. Marked severity diffuse bilateral infiltrates. 2. Very small bilateral pleural effusions. 3. Heterogeneous right-sided paratracheal soft tissue mass which is worrisome for the presence of an underlying neoplasm  PUD Prophylaxis : PPI  Disposition Plan  :    Status is: Inpatient  Remains inpatient appropriate because:IV treatments appropriate due to intensity of illness or inability to take PO   Dispo: The patient is from: Home              Anticipated d/c is to: Home              Anticipated d/c date is: > 3 days              Patient currently is not medically stable to d/c.   DVT Prophylaxis  :  Lovenox   Lab Results  Component Value Date   PLT 347 12/16/2019    Diet :  Diet Order            Diet regular Room service appropriate? Yes; Fluid consistency: Thin  Diet effective now                  Inpatient  Medications  Scheduled Meds: . albuterol  2 puff Inhalation Q6H  . vitamin C  500 mg Oral Daily  . atorvastatin  10 mg Oral Daily  . bromocriptine  1.25 mg Oral QHS  . enoxaparin (LOVENOX) injection  40 mg Subcutaneous Q12H  . famotidine  20 mg Oral BID  . lipase/protease/amylase  24,000 Units Oral TID WC  . methylPREDNISolone (SOLU-MEDROL) injection  60 mg Intravenous BID  . mometasone-formoterol  2 puff Inhalation Daily  . sodium chloride flush  3 mL Intravenous Q12H  . zinc sulfate  220 mg Oral Daily   Continuous Infusions: . lactated ringers    . remdesivir 100 mg in NS 100 mL 100 mg (12/16/19 0939)   PRN Meds:.chlorpheniramine-HYDROcodone, guaiFENesin-dextromethorphan, lipase/protease/amylase  Antibiotics  :    Anti-infectives (From admission, onward)   Start     Dose/Rate Route Frequency Ordered Stop   12/16/19 1000  remdesivir 100 mg in sodium chloride 0.9 % 100 mL IVPB     Discontinue    "Followed by" Linked Group Details   100 mg 200 mL/hr over 30 Minutes Intravenous Daily 12/15/19 1637 12/20/19 0959   12/15/19 1715  remdesivir 200 mg in sodium chloride 0.9% 250 mL IVPB       "Followed by" Linked  Group Details   200 mg 580 mL/hr over 30 Minutes Intravenous Once 12/15/19 1637 12/15/19 2000       Time Spent in minutes  30   Lala Lund M.D on 12/16/2019 at 11:17 AM  To page go to [email or web address] - password [facility]  [facility] -  Office  [phone]     See all Orders from today for further details    Objective:   Vitals:   12/16/19 0338 12/16/19 0400 12/16/19 0912 12/16/19 1000  BP: 110/71  104/69   Pulse: 72 71 77 82  Resp: 18     Temp: 97.6 F (36.4 C)  97.7 F (36.5 C)   TempSrc: Oral  Oral   SpO2: 98% 95%  96%  Weight:      Height:        Wt Readings from Last 3 Encounters:  12/15/19 115.9 kg  12/05/19 117.9 kg  04/03/19 116.9 kg    No intake or output data in the 24 hours ending 12/16/19 1117   Physical Exam  Awake Alert,  No new F.N deficits, Normal affect [place].AT,PERRAL Supple Neck,No JVD, No cervical lymphadenopathy appriciated.  Symmetrical Chest wall movement, Good air movement bilaterally, CTAB RRR,No Gallops,Rubs or new Murmurs, No Parasternal Heave +ve B.Sounds, Abd Soft, No tenderness, No organomegaly appriciated, No rebound - guarding or rigidity. No Cyanosis, Clubbing or edema, No new Rash or bruise       Data Review:    CBC Recent Labs  Lab 12/15/19 1222 12/16/19 0325  WBC 8.0 7.0  HGB 11.6* 11.4*  HCT 35.4* 34.1*  PLT 328 347  MCV 83.3 84.0  MCH 27.3 28.1  MCHC 32.8 33.4  RDW 13.5 13.7  LYMPHSABS 0.9 0.7  MONOABS 0.4 0.4  EOSABS 0.1 0.0  BASOSABS 0.0 0.0    Chemistries  Recent Labs  Lab 12/15/19 1222 12/16/19 0325  NA 137 138  K 3.3* 3.9  CL 100 101  CO2 27 28  GLUCOSE 106* 157*  BUN 6 9  CREATININE 0.92 0.97  CALCIUM 8.2* 8.2*  AST 31 27  ALT 31 28  ALKPHOS 74 71  BILITOT 0.7 0.5  MG  --  2.6*     ------------------------------------------------------------------------------------------------------------------ No results for input(s): CHOL, HDL, LDLCALC, TRIG, CHOLHDL, LDLDIRECT in the last 72 hours.  No results found for: HGBA1C ------------------------------------------------------------------------------------------------------------------ No results for input(s): TSH, T4TOTAL, T3FREE, THYROIDAB in the last 72 hours.  Invalid input(s): FREET3  Cardiac Enzymes No results for input(s): CKMB, TROPONINI, MYOGLOBIN in the last 168 hours.  Invalid input(s): CK ------------------------------------------------------------------------------------------------------------------    Component Value Date/Time   BNP 13.5 12/16/2019 0325    Micro Results Recent Results (from the past 240 hour(s))  Blood culture (routine x 2)     Status: None (Preliminary result)   Collection Time: 12/15/19 12:23 PM   Specimen: BLOOD  Result Value Ref Range Status   Specimen  Description BLOOD RIGHT ANTECUBITAL  Final   Special Requests   Final    BOTTLES DRAWN AEROBIC AND ANAEROBIC Blood Culture adequate volume   Culture   Final    NO GROWTH < 24 HOURS Performed at [facility], [address] [place],  [address]    Report Status PENDING  Incomplete  Blood culture (routine x 2)     Status: None (Preliminary result)   Collection Time: 12/15/19 12:42 PM   Specimen: BLOOD  Result Value Ref Range Status   Specimen Description BLOOD LEFT ANTECUBITAL  Final  Special Requests   Final    BOTTLES DRAWN AEROBIC AND ANAEROBIC Blood Culture adequate volume   Culture   Final    NO GROWTH < 24 HOURS Performed at [facility], [place] [address], [place], [place] [address]    Report Status PENDING  Incomplete  SARS Coronavirus 2 by RT PCR (hospital order, performed in [facility] hospital lab) Nasopharyngeal Nasopharyngeal Swab     Status: Abnormal   Collection Time: 12/15/19  1:05 PM   Specimen: Nasopharyngeal Swab  Result Value Ref Range Status   SARS Coronavirus 2 POSITIVE (A) NEGATIVE Final    Comment: RESULT CALLED TO, READ BACK BY AND VERIFIED WITH: M. Ferdinand Lango RN 15:45 12/15/19 ([email or web address]) (NOTE) SARS-CoV-2 target nucleic acids are DETECTED  SARS-CoV-2 RNA is generally detectable in upper respiratory specimens  during the acute phase of infection.  Positive results are indicative  of the presence of the identified virus, but do not rule out bacterial infection or co-infection with other pathogens not detected by the test.  Clinical correlation with patient history and  other diagnostic information is necessary to determine patient infection status.  The expected result is negative.  Fact Sheet for Patients:   StrictlyIdeas.no   Fact Sheet for Healthcare Providers:   BankingDealers.co.za    This test is not yet approved or cleared by the [place] FDA and  has been authorized for detection  and/or diagnosis of SARS-CoV-2 by FDA under an Emergency Use Authorization (EUA).  This EUA will remain in effect (meaning this  test can be used) for the duration of  the COVID-19 declaration under Section 564(b)(1) of the Act, 21 U.S.C. section 360-bbb-3(b)(1), unless the authorization is terminated or revoked sooner.  Performed at [facility], [place] [address], [place], [place] [address]     Radiology Reports CT Angio Chest PE W and/or Wo Contrast  Result Date: 12/15/2019 CLINICAL DATA:  Shortness of breath and weakness. EXAM: CT ANGIOGRAPHY CHEST WITH CONTRAST TECHNIQUE: Multidetector CT imaging of the chest was performed using the standard protocol during bolus administration of intravenous contrast. Multiplanar CT image reconstructions and MIPs were obtained to evaluate the vascular anatomy. CONTRAST:  56m OMNIPAQUE IOHEXOL 350 MG/ML SOLN COMPARISON:  None. FINDINGS: Cardiovascular: Satisfactory opacification of the pulmonary arteries to the segmental level. No evidence of pulmonary embolism. Normal heart size. No pericardial effusion. Mediastinum/Nodes: A 4.4 cm x 3.1 cm heterogeneous soft tissue mass is seen along the paratracheal region on the right. This extends along the posterior aspect of the right hilum to the subcarinal region. Lungs/Pleura: Marked severity diffuse bilateral infiltrates are seen. Very small bilateral pleural effusions are noted. There is no evidence of a pneumothorax. Upper Abdomen: No acute abnormality. Musculoskeletal: No chest wall abnormality. No acute or significant osseous findings. Review of the MIP images confirms the above findings. IMPRESSION: 1. Marked severity diffuse bilateral infiltrates. 2. Very small bilateral pleural effusions. 3. Heterogeneous right-sided paratracheal soft tissue mass which is worrisome for the presence of an underlying neoplasm. Electronically Signed   By: TVirgina NorfolkM.D.   On: 12/15/2019 16:13   DG Chest Portable 1  View  Result Date: 12/15/2019 CLINICAL DATA:  COVID-19 positive, cough, shortness of breath, chest pain, hypoxia EXAM: PORTABLE CHEST 1 VIEW COMPARISON:  Portable exam 1230 hours compared to 12/05/2019 FINDINGS: Upper normal heart size. Mediastinal contours and pulmonary vascularity normal. Patchy airspace infiltrates bilaterally in the mid to lower lungs consistent with multifocal pneumonia significantly increased from prior study.  No pleural effusion or pneumothorax. Osseous structures unremarkable. IMPRESSION: Significantly increased BILATERAL pulmonary infiltrates in the mid to lower lungs consistent with multifocal pneumonia. Electronically Signed   By: Lavonia Dana M.D.   On: 12/15/2019 13:09   DG Chest Portable 1 View  Result Date: 12/05/2019 CLINICAL DATA:  COVID-19 positivity EXAM: PORTABLE CHEST 1 VIEW COMPARISON:  None. FINDINGS: Cardiac shadow is within normal limits. The lungs are well aerated bilaterally. Patchy opacities are noted in the right lung base consistent with the given clinical history. No sizable effusion or pneumothorax is noted. No bony abnormality is seen. IMPRESSION: Patchy opacities in the right base consistent with the given clinical history. Electronically Signed   By: Inez Catalina M.D.   On: 12/05/2019 22:02

## 2019-12-17 LAB — BRAIN NATRIURETIC PEPTIDE: B Natriuretic Peptide: 39.3 pg/mL (ref 0.0–100.0)

## 2019-12-17 LAB — CBC WITH DIFFERENTIAL/PLATELET
Abs Immature Granulocytes: 0.22 10*3/uL — ABNORMAL HIGH (ref 0.00–0.07)
Basophils Absolute: 0 10*3/uL (ref 0.0–0.1)
Basophils Relative: 0 %
Eosinophils Absolute: 0 10*3/uL (ref 0.0–0.5)
Eosinophils Relative: 0 %
HCT: 33 % — ABNORMAL LOW (ref 36.0–46.0)
Hemoglobin: 11.1 g/dL — ABNORMAL LOW (ref 12.0–15.0)
Immature Granulocytes: 1 %
Lymphocytes Relative: 8 %
Lymphs Abs: 1.3 10*3/uL (ref 0.7–4.0)
MCH: 27.9 pg (ref 26.0–34.0)
MCHC: 33.6 g/dL (ref 30.0–36.0)
MCV: 82.9 fL (ref 80.0–100.0)
Monocytes Absolute: 0.7 10*3/uL (ref 0.1–1.0)
Monocytes Relative: 4 %
Neutro Abs: 14.8 10*3/uL — ABNORMAL HIGH (ref 1.7–7.7)
Neutrophils Relative %: 87 %
Platelets: 397 10*3/uL (ref 150–400)
RBC: 3.98 MIL/uL (ref 3.87–5.11)
RDW: 13.9 % (ref 11.5–15.5)
WBC: 17 10*3/uL — ABNORMAL HIGH (ref 4.0–10.5)
nRBC: 0.1 % (ref 0.0–0.2)

## 2019-12-17 LAB — COMPREHENSIVE METABOLIC PANEL WITH GFR
ALT: 28 U/L (ref 0–44)
AST: 25 U/L (ref 15–41)
Albumin: 2.4 g/dL — ABNORMAL LOW (ref 3.5–5.0)
Alkaline Phosphatase: 68 U/L (ref 38–126)
Anion gap: 11 (ref 5–15)
BUN: 12 mg/dL (ref 6–20)
CO2: 24 mmol/L (ref 22–32)
Calcium: 8.5 mg/dL — ABNORMAL LOW (ref 8.9–10.3)
Chloride: 104 mmol/L (ref 98–111)
Creatinine, Ser: 0.79 mg/dL (ref 0.44–1.00)
GFR calc Af Amer: >60 mL/min
GFR calc non Af Amer: >60 mL/min
Glucose, Bld: 183 mg/dL — ABNORMAL HIGH (ref 70–99)
Potassium: 3.6 mmol/L (ref 3.5–5.1)
Sodium: 139 mmol/L (ref 135–145)
Total Bilirubin: 0.5 mg/dL (ref 0.3–1.2)
Total Protein: 6.3 g/dL — ABNORMAL LOW (ref 6.5–8.1)

## 2019-12-17 LAB — D-DIMER, QUANTITATIVE: D-Dimer, Quant: 2.32 ug/mL-FEU — ABNORMAL HIGH (ref 0.00–0.50)

## 2019-12-17 LAB — PROCALCITONIN: Procalcitonin: <0.1 ng/mL

## 2019-12-17 LAB — MAGNESIUM: Magnesium: 2.5 mg/dL — ABNORMAL HIGH (ref 1.7–2.4)

## 2019-12-17 LAB — C-REACTIVE PROTEIN: CRP: 22.5 mg/dL — ABNORMAL HIGH

## 2019-12-17 MED ORDER — PANTOPRAZOLE SODIUM 40 MG PO TBEC
40.0000 mg | DELAYED_RELEASE_TABLET | Freq: Two times a day (BID) | ORAL | Status: DC
  Administered 2019-12-17 – 2019-12-19 (×4): 40 mg via ORAL
  Filled 2019-12-17 (×4): qty 1

## 2019-12-17 MED ORDER — PANTOPRAZOLE SODIUM 20 MG PO TBEC
20.0000 mg | DELAYED_RELEASE_TABLET | Freq: Once | ORAL | Status: AC
  Administered 2019-12-17: 20 mg via ORAL
  Filled 2019-12-17: qty 1

## 2019-12-17 MED ORDER — ACETAMINOPHEN 325 MG PO TABS
650.0000 mg | ORAL_TABLET | Freq: Four times a day (QID) | ORAL | Status: DC | PRN
  Administered 2019-12-17: 650 mg via ORAL
  Filled 2019-12-17: qty 2

## 2019-12-17 MED ORDER — ALUM & MAG HYDROXIDE-SIMETH 200-200-20 MG/5ML PO SUSP
30.0000 mL | Freq: Four times a day (QID) | ORAL | Status: DC | PRN
  Administered 2019-12-17 – 2019-12-18 (×2): 30 mL via ORAL
  Filled 2019-12-17 (×2): qty 30

## 2019-12-17 NOTE — Progress Notes (Signed)
PROGRESS NOTE                                                                                                                                                                                                             Patient Demographics:    Beth [name], is a 44 y.o. female, DOB - 20-Apr-1976, GNO:[id number]  Outpatient Primary MD for the patient is Patient, No Pcp Per    LOS - 2  Admit date - 12/15/2019    Chief Complaint  Patient presents with  . Shortness of Breath  . Weakness  . Emesis       Brief Narrative - Tahlor Merkey is a 44 y.o. female with medical history significant of asthma, chronic pancreatitis, and prolactinoma presents with complaints of progressively worsening productive cough and shortness of breath, she was diagnosed with acute hypoxic respiratory failure due to COVID-19 pneumonia and admitted to the hospital.   Subjective:   Patient in bed, appears comfortable, denies any headache, no fever, no chest pain or pressure, improved shortness of breath , no abdominal pain. No focal weakness.   Assessment  & Plan :     1. Acute Hypoxic Resp. Failure due to Acute Covid 19 Viral Pneumonitis during the ongoing 2020 Covid 19 Pandemic - she severe disease it has been placed on high-dose IV steroids and remdesivir.  Currently on supplemental oxygen 4 L, will monitor closely if she gets any worse we will use Actemra as she has already consented for it.  Encouraged the patient to sit up in chair in the daytime use I-S and flutter valve for pulmonary toiletry and then prone in bed when at night.  Will advance activity and titrate down oxygen as possible.  Actemra off label use - patient was told that if COVID-19 pneumonitis gets worse we might potentially use Actemra off label, patient denies any known history of active diverticulitis, tuberculosis or hepatitis, understands the risks and benefits and wants to  proceed with Actemra treatment if required.    SpO2: 90 % O2 Flow Rate (L/min): 4 L/min  Recent Labs  Lab 12/15/19 1222 12/15/19 1305 12/15/19 2037 12/16/19 0325 12/17/19 0213  CRP  --   --  32.3* 33.4* 22.5*  DDIMER 2.05*  --   --  2.70* 2.32*  BNP  --   --  22.2 13.5  39.3  PROCALCITON  --   --  0.14 0.12 <0.10  SARSCOV2NAA  --  POSITIVE*  --   --   --     Hepatic Function Latest Ref Rng & Units 12/17/2019 12/16/2019 12/15/2019  Total Protein 6.5 - 8.1 g/dL 6.3(L) 6.7 6.4(L)  Albumin 3.5 - 5.0 g/dL 2.4(L) 2.3(L) 2.5(L)  AST 15 - 41 U/L _0 ALT 0 - 44 U/L _1 Alk Phosphatase 38 - 126 U/L 68 71 74  Total Bilirubin 0.3 - 1.2 mg/dL 0.5 0.5 0.7     2. Heterogeneous right-sided paratracheal soft tissue mass which is worrisome for the presence of an underlying neoplasm - incidentally on CT scan chest, discussed this with pulmonary critical care plan is to consult them once her hypoxia improves she probably will require bronch guided biopsy this admission.  3.  Pancreatitis.  Stable no acute issues.  Continue chronic Creon supplementation.   4.  Dyslipidemia.  Continue statin.  5.  GERD.  PPI.   Condition -   Guarded  Family Communication  :  None  Code Status :  Full  Consults  :    Procedures  :   CT -  1. Marked severity diffuse bilateral infiltrates. 2. Very small bilateral pleural effusions. 3. Heterogeneous right-sided paratracheal soft tissue mass which is worrisome for the presence of an underlying neoplasm  PUD Prophylaxis : PPI  Disposition Plan  :    Status is: Inpatient  Remains inpatient appropriate because:IV treatments appropriate due to intensity of illness or inability to take PO   Dispo: The patient is from: Home              Anticipated d/c is to: Home              Anticipated d/c date is: > 3 days              Patient currently is not medically stable to d/c.   DVT Prophylaxis  :  Lovenox   Lab Results  Component Value Date    PLT 397 12/17/2019    Diet :  Diet Order            Diet regular Room service appropriate? Yes; Fluid consistency: Thin  Diet effective now                  Inpatient Medications  Scheduled Meds: . albuterol  2 puff Inhalation Q6H  . vitamin C  500 mg Oral Daily  . atorvastatin  10 mg Oral Daily  . bromocriptine  1.25 mg Oral QHS  . enoxaparin (LOVENOX) injection  40 mg Subcutaneous Q12H  . famotidine  20 mg Oral BID  . lipase/protease/amylase  24,000 Units Oral TID WC  . methylPREDNISolone (SOLU-MEDROL) injection  60 mg Intravenous BID  . mometasone-formoterol  2 puff Inhalation Daily  . omega-3 acid ethyl esters  2 capsule Oral Daily  . pantoprazole  20 mg Oral Daily  . sodium chloride flush  3 mL Intravenous Q12H  . zinc sulfate  220 mg Oral Daily   Continuous Infusions: . remdesivir 100 mg in NS 100 mL 100 mg (12/17/19 1018)   PRN Meds:.acetaminophen, chlorpheniramine-HYDROcodone, guaiFENesin-dextromethorphan, lipase/protease/amylase  Antibiotics  :    Anti-infectives (From admission, onward)   Start     Dose/Rate Route Frequency Ordered Stop   12/16/19 1000  remdesivir 100 mg in sodium chloride 0.9 % 100 mL IVPB     Discontinue    "  Followed by" Linked Group Details   100 mg 200 mL/hr over 30 Minutes Intravenous Daily 12/15/19 1637 12/20/19 0959   12/15/19 1715  remdesivir 200 mg in sodium chloride 0.9% 250 mL IVPB       "Followed by" Linked Group Details   200 mg 580 mL/hr over 30 Minutes Intravenous Once 12/15/19 1637 12/15/19 2000       Time Spent in minutes  30   Lala Lund M.D on 12/17/2019 at 11:10 AM  To page go to [email or web address] - password [facility]  [facility] -  Office  [phone]     See all Orders from today for further details    Objective:   Vitals:   12/16/19 2029 12/17/19 0000 12/17/19 0440 12/17/19 0725  BP: 111/69 128/86 116/73 121/72  Pulse: 84 72 68 81  Resp: _0 Temp: 98.6 F (37 C) 98.3 F (36.8 C) 98.1  F (36.7 C) 98.5 F (36.9 C)  TempSrc: Oral Oral Oral Oral  SpO2: 96% 97% 97% 90%  Weight:      Height:        Wt Readings from Last 3 Encounters:  12/15/19 115.9 kg  12/05/19 117.9 kg  04/03/19 116.9 kg     Intake/Output Summary (Last 24 hours) at 12/17/2019 1110 Last data filed at 12/17/2019 1018 Gross per 24 hour  Intake 1426.14 ml  Output 300 ml  Net 1126.14 ml     Physical Exam  Awake Alert, No new F.N deficits, Normal affect [place].AT,PERRAL Supple Neck,No JVD, No cervical lymphadenopathy appriciated.  Symmetrical Chest wall movement, Good air movement bilaterally, CTAB RRR,No Gallops, Rubs or new Murmurs, No Parasternal Heave +ve B.Sounds, Abd Soft, No tenderness, No organomegaly appriciated, No rebound - guarding or rigidity. No Cyanosis, Clubbing or edema, No new Rash or bruise      Data Review:    CBC Recent Labs  Lab 12/15/19 1222 12/16/19 0325 12/17/19 0213  WBC 8.0 7.0 17.0*  HGB 11.6* 11.4* 11.1*  HCT 35.4* 34.1* 33.0*  PLT 328 347 397  MCV 83.3 84.0 82.9  MCH 27.3 28.1 27.9  MCHC 32.8 33.4 33.6  RDW 13.5 13.7 13.9  LYMPHSABS 0.9 0.7 1.3  MONOABS 0.4 0.4 0.7  EOSABS 0.1 0.0 0.0  BASOSABS 0.0 0.0 0.0    Chemistries  Recent Labs  Lab 12/15/19 1222 12/16/19 0325 12/17/19 0213  NA 137 138 139  K 3.3* 3.9 3.6  CL 100 101 104  CO2 _1 GLUCOSE 106* 157* 183*  BUN _2 CREATININE 0.92 0.97 0.79  CALCIUM 8.2* 8.2* 8.5*  AST _3 ALT _4 ALKPHOS 74 71 68  BILITOT 0.7 0.5 0.5  MG  --  2.6* 2.5*     ------------------------------------------------------------------------------------------------------------------ No results for input(s): CHOL, HDL, LDLCALC, TRIG, CHOLHDL, LDLDIRECT in the last 72 hours.  No results found for: HGBA1C ------------------------------------------------------------------------------------------------------------------ No results for input(s): TSH, T4TOTAL, T3FREE, THYROIDAB in the last 72  hours.  Invalid input(s): FREET3  Cardiac Enzymes No results for input(s): CKMB, TROPONINI, MYOGLOBIN in the last 168 hours.  Invalid input(s): CK ------------------------------------------------------------------------------------------------------------------    Component Value Date/Time   BNP 39.3 12/17/2019 0213    Micro Results Recent Results (from the past 240 hour(s))  Blood culture (routine x 2)     Status: None (Preliminary result)   Collection Time: 12/15/19 12:23 PM   Specimen: BLOOD  Result Value Ref Range Status   Specimen Description BLOOD  RIGHT ANTECUBITAL  Final   Special Requests   Final    BOTTLES DRAWN AEROBIC AND ANAEROBIC Blood Culture adequate volume   Culture   Final    NO GROWTH 2 DAYS Performed at [facility], [address], [place], [place] [address]    Report Status PENDING  Incomplete  Blood culture (routine x 2)     Status: None (Preliminary result)   Collection Time: 12/15/19 12:42 PM   Specimen: BLOOD  Result Value Ref Range Status   Specimen Description BLOOD LEFT ANTECUBITAL  Final   Special Requests   Final    BOTTLES DRAWN AEROBIC AND ANAEROBIC Blood Culture adequate volume   Culture   Final    NO GROWTH 2 DAYS Performed at [facility], [place] [address] [place], [place] [address]    Report Status PENDING  Incomplete  SARS Coronavirus 2 by RT PCR (hospital order, performed in [facility] hospital lab) Nasopharyngeal Nasopharyngeal Swab     Status: Abnormal   Collection Time: 12/15/19  1:05 PM   Specimen: Nasopharyngeal Swab  Result Value Ref Range Status   SARS Coronavirus 2 POSITIVE (A) NEGATIVE Final    Comment: RESULT CALLED TO, READ BACK BY AND VERIFIED WITH: M. Ferdinand Lango RN 15:45 12/15/19 ([email or web address]) (NOTE) SARS-CoV-2 target nucleic acids are DETECTED  SARS-CoV-2 RNA is generally detectable in upper respiratory specimens  during the acute phase of infection.  Positive results are indicative  of the presence of the  identified virus, but do not rule out bacterial infection or co-infection with other pathogens not detected by the test.  Clinical correlation with patient history and  other diagnostic information is necessary to determine patient infection status.  The expected result is negative.  Fact Sheet for Patients:   StrictlyIdeas.no   Fact Sheet for Healthcare Providers:   BankingDealers.co.za    This test is not yet approved or cleared by the [place] FDA and  has been authorized for detection and/or diagnosis of SARS-CoV-2 by FDA under an Emergency Use Authorization (EUA).  This EUA will remain in effect (meaning this  test can be used) for the duration of  the COVID-19 declaration under Section 564(b)(1) of the Act, 21 U.S.C. section 360-bbb-3(b)(1), unless the authorization is terminated or revoked sooner.  Performed at [facility], [place] [address], [place], [place] [address]     Radiology Reports CT Angio Chest PE W and/or Wo Contrast  Result Date: 12/15/2019 CLINICAL DATA:  Shortness of breath and weakness. EXAM: CT ANGIOGRAPHY CHEST WITH CONTRAST TECHNIQUE: Multidetector CT imaging of the chest was performed using the standard protocol during bolus administration of intravenous contrast. Multiplanar CT image reconstructions and MIPs were obtained to evaluate the vascular anatomy. CONTRAST:  75m OMNIPAQUE IOHEXOL 350 MG/ML SOLN COMPARISON:  None. FINDINGS: Cardiovascular: Satisfactory opacification of the pulmonary arteries to the segmental level. No evidence of pulmonary embolism. Normal heart size. No pericardial effusion. Mediastinum/Nodes: A 4.4 cm x 3.1 cm heterogeneous soft tissue mass is seen along the paratracheal region on the right. This extends along the posterior aspect of the right hilum to the subcarinal region. Lungs/Pleura: Marked severity diffuse bilateral infiltrates are seen. Very small bilateral pleural  effusions are noted. There is no evidence of a pneumothorax. Upper Abdomen: No acute abnormality. Musculoskeletal: No chest wall abnormality. No acute or significant osseous findings. Review of the MIP images confirms the above findings. IMPRESSION: 1. Marked severity diffuse bilateral infiltrates. 2. Very small bilateral pleural effusions.  3. Heterogeneous right-sided paratracheal soft tissue mass which is worrisome for the presence of an underlying neoplasm. Electronically Signed   By: Virgina Norfolk M.D.   On: 12/15/2019 16:13   DG Chest Portable 1 View  Result Date: 12/15/2019 CLINICAL DATA:  COVID-19 positive, cough, shortness of breath, chest pain, hypoxia EXAM: PORTABLE CHEST 1 VIEW COMPARISON:  Portable exam 1230 hours compared to 12/05/2019 FINDINGS: Upper normal heart size. Mediastinal contours and pulmonary vascularity normal. Patchy airspace infiltrates bilaterally in the mid to lower lungs consistent with multifocal pneumonia significantly increased from prior study. No pleural effusion or pneumothorax. Osseous structures unremarkable. IMPRESSION: Significantly increased BILATERAL pulmonary infiltrates in the mid to lower lungs consistent with multifocal pneumonia. Electronically Signed   By: Lavonia Dana M.D.   On: 12/15/2019 13:09   DG Chest Portable 1 View  Result Date: 12/05/2019 CLINICAL DATA:  COVID-19 positivity EXAM: PORTABLE CHEST 1 VIEW COMPARISON:  None. FINDINGS: Cardiac shadow is within normal limits. The lungs are well aerated bilaterally. Patchy opacities are noted in the right lung base consistent with the given clinical history. No sizable effusion or pneumothorax is noted. No bony abnormality is seen. IMPRESSION: Patchy opacities in the right base consistent with the given clinical history. Electronically Signed   By: Inez Catalina M.D.   On: 12/05/2019 22:02

## 2019-12-17 NOTE — Plan of Care (Signed)
  Problem: Clinical Measurements: Goal: Respiratory complications will improve Outcome: Progressing   

## 2019-12-18 ENCOUNTER — Telehealth: Payer: Self-pay | Admitting: Internal Medicine

## 2019-12-18 ENCOUNTER — Inpatient Hospital Stay (HOSPITAL_COMMUNITY): Payer: BC Managed Care – PPO

## 2019-12-18 DIAGNOSIS — R918 Other nonspecific abnormal finding of lung field: Secondary | ICD-10-CM

## 2019-12-18 DIAGNOSIS — R599 Enlarged lymph nodes, unspecified: Secondary | ICD-10-CM

## 2019-12-18 DIAGNOSIS — R7989 Other specified abnormal findings of blood chemistry: Secondary | ICD-10-CM

## 2019-12-18 LAB — COMPREHENSIVE METABOLIC PANEL
ALT: 26 U/L (ref 0–44)
AST: 19 U/L (ref 15–41)
Albumin: 2.3 g/dL — ABNORMAL LOW (ref 3.5–5.0)
Alkaline Phosphatase: 64 U/L (ref 38–126)
Anion gap: 10 (ref 5–15)
BUN: 10 mg/dL (ref 6–20)
CO2: 25 mmol/L (ref 22–32)
Calcium: 8.4 mg/dL — ABNORMAL LOW (ref 8.9–10.3)
Chloride: 104 mmol/L (ref 98–111)
Creatinine, Ser: 0.81 mg/dL (ref 0.44–1.00)
GFR calc Af Amer: >60 mL/min (ref >60)
GFR calc non Af Amer: >60 mL/min (ref >60)
Glucose, Bld: 169 mg/dL — ABNORMAL HIGH (ref 70–99)
Potassium: 3.8 mmol/L (ref 3.5–5.1)
Sodium: 139 mmol/L (ref 135–145)
Total Bilirubin: 0.5 mg/dL (ref 0.3–1.2)
Total Protein: 5.9 g/dL — ABNORMAL LOW (ref 6.5–8.1)

## 2019-12-18 LAB — CBC WITH DIFFERENTIAL/PLATELET
Abs Immature Granulocytes: 0.44 10*3/uL — ABNORMAL HIGH (ref 0.00–0.07)
Basophils Absolute: 0 10*3/uL (ref 0.0–0.1)
Basophils Relative: 0 %
Eosinophils Absolute: 0 10*3/uL (ref 0.0–0.5)
Eosinophils Relative: 0 %
HCT: 31.1 % — ABNORMAL LOW (ref 36.0–46.0)
Hemoglobin: 10.5 g/dL — ABNORMAL LOW (ref 12.0–15.0)
Immature Granulocytes: 3 %
Lymphocytes Relative: 8 %
Lymphs Abs: 1.2 10*3/uL (ref 0.7–4.0)
MCH: 28.1 pg (ref 26.0–34.0)
MCHC: 33.8 g/dL (ref 30.0–36.0)
MCV: 83.2 fL (ref 80.0–100.0)
Monocytes Absolute: 0.7 10*3/uL (ref 0.1–1.0)
Monocytes Relative: 4 %
Neutro Abs: 13.8 10*3/uL — ABNORMAL HIGH (ref 1.7–7.7)
Neutrophils Relative %: 85 %
Platelets: 445 10*3/uL — ABNORMAL HIGH (ref 150–400)
RBC: 3.74 MIL/uL — ABNORMAL LOW (ref 3.87–5.11)
RDW: 14.4 % (ref 11.5–15.5)
WBC: 16.1 10*3/uL — ABNORMAL HIGH (ref 4.0–10.5)
nRBC: 0.1 % (ref 0.0–0.2)

## 2019-12-18 LAB — PROCALCITONIN: Procalcitonin: <0.1 ng/mL

## 2019-12-18 LAB — BRAIN NATRIURETIC PEPTIDE: B Natriuretic Peptide: 50.2 pg/mL (ref 0.0–100.0)

## 2019-12-18 LAB — MAGNESIUM: Magnesium: 2.4 mg/dL (ref 1.7–2.4)

## 2019-12-18 LAB — D-DIMER, QUANTITATIVE: D-Dimer, Quant: 2.1 ug/mL-FEU — ABNORMAL HIGH (ref 0.00–0.50)

## 2019-12-18 LAB — C-REACTIVE PROTEIN: CRP: 13 mg/dL — ABNORMAL HIGH (ref <1.0)

## 2019-12-18 MED ORDER — ENOXAPARIN SODIUM 60 MG/0.6ML ~~LOC~~ SOLN
60.0000 mg | Freq: Two times a day (BID) | SUBCUTANEOUS | Status: DC
  Administered 2019-12-18 – 2019-12-19 (×3): 60 mg via SUBCUTANEOUS
  Filled 2019-12-18 (×3): qty 0.6

## 2019-12-18 MED ORDER — MAGNESIUM HYDROXIDE 400 MG/5ML PO SUSP
30.0000 mL | Freq: Two times a day (BID) | ORAL | Status: AC
  Administered 2019-12-18 (×2): 30 mL via ORAL
  Filled 2019-12-18 (×2): qty 30

## 2019-12-18 NOTE — Progress Notes (Signed)
Venous duplex       has been completed. Preliminary results can be found under CV proc through chart review. Nemiah Bubar, BS, RDMS, RVT   

## 2019-12-18 NOTE — Consult Note (Signed)
NAME:  Beth [name], MRN:  [id number], DOB:  [birth year], LOS: 3 ADMISSION DATE:  12/15/2019, CONSULTATION DATE:  12/18/19 REFERRING MD:  Candiss Norse, CHIEF COMPLAINT:  SOB   Brief History   44 year old woman with COVID pneumonia presenting with abnormal chest imaging.  History of present illness   Here with severe COVID pneumonia. Symptoms started ~6/6, tested +6/11, went to ER, dc'd Came back worsening dyspnea 6/21, admitted Started on usual COVID cocktail D dimer elevated so CTA ordered, no PE but did show para-tracheal soft tissue density for which pulmonary is consulted.  Presently, patient c/o fatigue, dyspnea with minimal exertion. She has a history of asthma that was worse as a child and was bad with her pregnancies.  She is new to area and has not established a PCP yet.  Past Medical History  Asthma Small prolactinoma expectantly managed Chronic pancreatiits  Significant Hospital Events   N/A  Consults:  N/A  Procedures:  N/A  Significant Diagnostic Tests:  CTA chest- severe bilateral airspace disease, right paratracheal mass vs. exaggerated lymph node  Micro Data:  COVID positive  Antimicrobials:  Steroids, remdesivir   Interim history/subjective:  Consulted  Objective   Blood pressure 115/77, pulse 89, temperature 98.6 F (37 C), temperature source Oral, resp. rate 20, height 5\' 9"  (1.753 m), weight 115.9 kg, last menstrual period 12/15/2019, SpO2 91 %.        Intake/Output Summary (Last 24 hours) at 12/18/2019 1039 Last data filed at 12/18/2019 0551 Gross per 24 hour  Intake 480 ml  Output 400 ml  Net 80 ml   Filed Weights   12/15/19 1055 12/15/19 2005  Weight: 117.9 kg 115.9 kg    Examination: General: in mild resp distress HENT: malampatti 3, MMM Lungs: severely diminished bilaterally with + accessory muscle use Cardiovascular: tachycardic, ext warm Abdomen: Soft, +BS Extremities: no edema Neuro: moves all 4 ext to command, answering  questions appropriately Skin: no rashes  Resolved Hospital Problem list   N/A  Assessment & Plan:  Abnormal CT imaging in COVID 19 patient- I am hopeful this is just an exaggerated right paratracheal node.  Her respiratory status and history of bronchospasm makes bronchoscopy fairly high risk for needing mechanical ventilation after.  She is a nonsmoker and has no B symptoms prior to [place].  Discussed risks and benefits of immediate biopsy vs. Watch and wait.  We agreed that latter is safer course of action. - CT chest in 1 month with contrast and followup in pulmonary office to discuss if biopsy required: I will set this up - Continue excellent COVID ARDS care per Dr. Desma Mcgregor for asthma - Called patient's mother at patient's request - Patient would like to establish care with a local PCP, will route this request to Dr. Candiss Norse  Labs   CBC: Recent Labs  Lab 12/15/19 1222 12/16/19 0325 12/17/19 0213 12/18/19 0309  WBC 8.0 7.0 17.0* 16.1*  NEUTROABS 6.6 5.8 14.8* 13.8*  HGB 11.6* 11.4* 11.1* 10.5*  HCT 35.4* 34.1* 33.0* 31.1*  MCV 83.3 84.0 82.9 83.2  PLT 328 347 397 445*    Basic Metabolic Panel: Recent Labs  Lab 12/15/19 1222 12/16/19 0325 12/17/19 0213 12/18/19 0309  NA 137 138 139 139  K 3.3* 3.9 3.6 3.8  CL 100 101 104 104  CO2 27 28 24 25   GLUCOSE 106* 157* 183* 169*  BUN 6 9 12 10   CREATININE 0.92 0.97 0.79 0.81  CALCIUM 8.2* 8.2* 8.5* 8.4*  MG  --  2.6* 2.5* 2.4  PHOS  --  1.9*  --   --    GFR: Estimated Creatinine Clearance: 121.7 mL/min (by C-G formula based on SCr of 0.81 mg/dL). Recent Labs  Lab 12/15/19 1222 12/15/19 1223 12/15/19 1438 12/15/19 2037 12/16/19 0325 12/17/19 0213 12/18/19 0309  PROCALCITON  --   --   --  0.14 0.12 <0.10 <0.10  WBC 8.0  --   --   --  7.0 17.0* 16.1*  LATICACIDVEN  --  1.3 1.5  --   --   --   --     Liver Function Tests: Recent Labs  Lab 12/15/19 1222 12/16/19 0325 12/17/19 0213 12/18/19 0309  AST 31  27 25 19   ALT 31 28 28 26   ALKPHOS 74 71 68 64  BILITOT 0.7 0.5 0.5 0.5  PROT 6.4* 6.7 6.3* 5.9*  ALBUMIN 2.5* 2.3* 2.4* 2.3*   Recent Labs  Lab 12/15/19 1222  LIPASE 27   No results for input(s): AMMONIA in the last 168 hours.  ABG No results found for: PHART, PCO2ART, PO2ART, HCO3, TCO2, ACIDBASEDEF, O2SAT   Coagulation Profile: No results for input(s): INR, PROTIME in the last 168 hours.  Cardiac Enzymes: No results for input(s): CKTOTAL, CKMB, CKMBINDEX, TROPONINI in the last 168 hours.  HbA1C: No results found for: HGBA1C  CBG: No results for input(s): GLUCAP in the last 168 hours.  Review of Systems:    Positive Symptoms in bold:  Constitutional fevers, chills, weight loss, fatigue, anorexia, malaise  Eyes decreased vision, double vision, eye irritation  Ears, Nose, Mouth, Throat sore throat, trouble swallowing, sinus congestion  Cardiovascular chest pain, paroxysmal nocturnal dyspnea, lower ext edema, palpitations   Respiratory SOB, cough, DOE, hemoptysis, wheezing  Gastrointestinal nausea, vomiting, diarrhea  Genitourinary burning with urination, trouble urinating  Musculoskeletal joint aches, joint swelling, back pain  Integumentary  rashes, skin lesions  Neurological focal weakness, focal numbness, trouble speaking, headaches  Psychiatric depression, anxiety, confusion  Endocrine polyuria, polydipsia, cold intolerance, heat intolerance  Hematologic abnormal bruising, abnormal bleeding, unexplained nose bleeds  Allergic/Immunologic recurrent infections, hives, swollen lymph nodes     Past Medical History  She,  has a past medical history of Asthma, Chronic pancreatitis ([place]), Hypoglycemia, and Prolactinoma ([place]).   Surgical History    Past Surgical History:  Procedure Laterality Date  . CHOLECYSTECTOMY    . COLONOSCOPY       Social History   reports that she has never smoked. She has never used smokeless tobacco. She reports that she does not  drink alcohol and does not use drugs.   Family History   Her family history includes Other in her paternal aunt.   Allergies Allergies  Allergen Reactions  . [place] Swelling  . [place] Grass Hives  . Cedar Swelling  . Cladosporium Cladosporioides Hives  . Dust Mite Extract Hives  . Elm Bark [Ulmus Fulva] Swelling  . Lidocaine Hcl Swelling  . Molds & Smuts Hives  . Mugwort Hives  . Nettle [Urtica Dioica] Hives  . Sorrel-Dock Mix [Sheep Sorrel-Yellow Dock] Hives  . Timothy Grass Pollen Allergen Hives  . Xylocaine [Lidocaine] Swelling     Home Medications  Prior to Admission medications   Medication Sig Start Date End Date Taking? Authorizing Provider  albuterol (PROAIR HFA) 108 (90 Base) MCG/ACT inhaler Inhale 1 puff into the lungs as needed for wheezing or shortness of breath.    Yes [provider]  atorvastatin (LIPITOR) 10 MG  tablet Take 10 mg by mouth daily.    Yes [provider]  bromocriptine (PARLODEL) 2.5 MG tablet Take 0.5 tablets (1.25 mg total) by mouth at bedtime. 04/04/19  Yes Renato Shin, MD  hydrochlorothiazide (HYDRODIURIL) 12.5 MG tablet Take 12.5 mg by mouth daily. 03/20/19  Yes [provider]  levonorgestrel (MIRENA) 20 MCG/24HR IUD 1 each by Intrauterine route once.    Yes [provider]  mometasone-formoterol (DULERA) 100-5 MCG/ACT AERO Inhale 2 puffs into the lungs daily.    Yes [provider]  naftifine (NAFTIN) 1 % cream Apply 1 application topically daily as needed (fungs infection).  11/09/16  Yes [provider]  naproxen sodium (ALEVE) 220 MG tablet Take 220 mg by mouth daily as needed (pain).    Yes [provider]  omega-3 fish oil (MAXEPA) 1000 MG CAPS capsule Take 2 capsules by mouth daily.    Yes [provider]  ondansetron (ZOFRAN-ODT) 4 MG disintegrating tablet Take 4 mg by mouth every 8 (eight) hours as needed for nausea or vomiting.    Yes [provider]    Pancrelipase, Lip-Prot-Amyl, (CREON) 24000-76000 units CPEP Take 2 capsules by mouth See admin instructions. Take 2 capsules three times daily with food and 1 capsule twice daily with snacks. 04/20/17  Yes [provider]  pantoprazole (PROTONIX) 20 MG tablet Take 20 mg by mouth daily. 11/25/19  Yes [provider]  tizanidine (ZANAFLEX) 2 MG capsule Take 2 mg by mouth 3 (three) times daily as needed for muscle spasms.    Yes [provider]  EPINEPHrine (EPIPEN 2-PAK) 0.3 mg/0.3 mL IJ SOAJ injection Inject 0.3 mg into the muscle once.     [provider]  predniSONE (DELTASONE) 20 MG tablet Take 2 tablets (40 mg total) by mouth daily with breakfast. For the next four days Patient not taking: Reported on 12/15/2019 12/05/19   Carmin Muskrat, MD

## 2019-12-18 NOTE — Telephone Encounter (Signed)
CT chest with contrast in 1 month (OP Orders)

## 2019-12-18 NOTE — Progress Notes (Signed)
PROGRESS NOTE                                                                                                                                                                                                             Patient Demographics:    Beth [name], is a 44 y.o. female, DOB - 1975/12/14, VVK:[id number]  Outpatient Primary MD for the patient is Patient, No Pcp Per    LOS - 3  Admit date - 12/15/2019    Chief Complaint  Patient presents with  . Shortness of Breath  . Weakness  . Emesis       Brief Narrative - Beth [name] is a 44 y.o. female with medical history significant of asthma, chronic pancreatitis, and prolactinoma presents with complaints of progressively worsening productive cough and shortness of breath, she was diagnosed with acute hypoxic respiratory failure due to COVID-19 pneumonia and admitted to the hospital.   Subjective:   Patient in bed, appears comfortable, denies any headache, no fever, no chest pain or pressure, no shortness of breath , no abdominal pain. No focal weakness.    Assessment  & Plan :     1. Acute Hypoxic Resp. Failure due to Acute Covid 19 Viral Pneumonitis during the ongoing 2020 Covid 19 Pandemic - she severe disease it has been placed on high-dose IV steroids and remdesivir.  She has shown considerable clinical improvement and now down to 2 L nasal cannula oxygen, finished her remdesivir course, encouraged the patient to sit up in chair in the daytime use I-S and flutter valve for pulmonary toiletry and then prone in bed when at night.  [name] advance activity and titrate down oxygen as possible.     SpO2: 91 % O2 Flow Rate (L/min): 2 L/min  Recent Labs  Lab 12/15/19 1222 12/15/19 1305 12/15/19 2037 12/16/19 0325 12/17/19 0213 12/18/19 0309  CRP  --   --  32.3* 33.4* 22.5* 13.0*  DDIMER 2.05*  --   --  2.70* 2.32* 2.10*  BNP  --   --  22.2 13.5 39.3 50.2    PROCALCITON  --   --  0.14 0.12 <0.10 <0.10  SARSCOV2NAA  --  POSITIVE*  --   --   --   --     Hepatic Function Latest Ref Rng & Units 12/18/2019 12/17/2019 12/16/2019  Total  Protein 6.5 - 8.1 g/dL 5.9(L) 6.3(L) 6.7  Albumin 3.5 - 5.0 g/dL 2.3(L) 2.4(L) 2.3(L)  AST 15 - 41 U/L '19 25 27  ' ALT 0 - 44 U/L '26 28 28  ' Alk Phosphatase 38 - 126 U/L 64 68 71  Total Bilirubin 0.3 - 1.2 mg/dL 0.5 0.5 0.5     2. Heterogeneous right-sided paratracheal soft tissue mass which is worrisome for the presence of an underlying neoplasm - incidentally on CT scan chest, discussed this with pulmonary critical care plan is to consult them once her hypoxia improves she probably [name] require bronch guided biopsy this admission.  [name] consult them on 12/18/2019.  3.  Pancreatitis.  Stable no acute issues.  Continue chronic Creon supplementation.   4.  Dyslipidemia.  Continue statin.  5.  GERD.  PPI.  6.  Moderately increased D-dimer due to inflammation from Covid, very high risk for developing a blood clot in the setting of inflammation.  Lovenox 60 twice daily for now.  [name] check lower extremity ultrasound.    Condition -   Guarded  Family Communication  :  None  Code Status :  Full  Consults  :    Procedures  :   CT -  1. Marked severity diffuse bilateral infiltrates. 2. Very small bilateral pleural effusions. 3. Heterogeneous right-sided paratracheal soft tissue mass which is worrisome for the presence of an underlying neoplasm  PUD Prophylaxis : PPI  Disposition Plan  :    Status is: Inpatient  Remains inpatient appropriate because:IV treatments appropriate due to intensity of illness or inability to take PO   Dispo: The patient is from: Home              Anticipated d/c is to: Home              Anticipated d/c date is: > 3 days              Patient currently is not medically stable to d/c.   DVT Prophylaxis  :  Lovenox   Lab Results  Component Value Date   PLT 445 (H) 12/18/2019     Diet :  Diet Order            Diet regular Room service appropriate? Yes; Fluid consistency: Thin  Diet effective now                  Inpatient Medications  Scheduled Meds: . albuterol  2 puff Inhalation Q6H  . vitamin C  500 mg Oral Daily  . atorvastatin  10 mg Oral Daily  . bromocriptine  1.25 mg Oral QHS  . enoxaparin (LOVENOX) injection  40 mg Subcutaneous Q12H  . famotidine  20 mg Oral BID  . lipase/protease/amylase  24,000 Units Oral TID WC  . magnesium hydroxide  30 mL Oral BID  . methylPREDNISolone (SOLU-MEDROL) injection  60 mg Intravenous BID  . mometasone-formoterol  2 puff Inhalation Daily  . omega-3 acid ethyl esters  2 capsule Oral Daily  . pantoprazole  40 mg Oral BID  . sodium chloride flush  3 mL Intravenous Q12H  . zinc sulfate  220 mg Oral Daily   Continuous Infusions: . remdesivir 100 mg in NS 100 mL 100 mg (12/18/19 0759)   PRN Meds:.acetaminophen, alum & mag hydroxide-simeth, chlorpheniramine-HYDROcodone, guaiFENesin-dextromethorphan, lipase/protease/amylase  Antibiotics  :    Anti-infectives (From admission, onward)   Start     Dose/Rate Route Frequency Ordered Stop   12/16/19 1000  remdesivir 100  mg in sodium chloride 0.9 % 100 mL IVPB     Discontinue    "Followed by" Linked Group Details   100 mg 200 mL/hr over 30 Minutes Intravenous Daily 12/15/19 1637 12/20/19 0959   12/15/19 1715  remdesivir 200 mg in sodium chloride 0.9% 250 mL IVPB       "Followed by" Linked Group Details   200 mg 580 mL/hr over 30 Minutes Intravenous Once 12/15/19 1637 12/15/19 2000       Time Spent in minutes  30  Lala Lund M.D on 12/18/2019 at 9:16 AM  To page go to [email or web address] - password [facility]  [facility] -  Office  [phone]  See all Orders from today for further details    Objective:   Vitals:   12/17/19 2015 12/18/19 0010 12/18/19 0415 12/18/19 0758  BP: 115/84 124/82 112/67 115/77  Pulse: 68 66 76 89  Resp: '18 18 20   ' Temp:  98.6 F (37 C) (!) 97.2 F (36.2 C) 98.6 F (37 C) 98.6 F (37 C)  TempSrc: Oral Axillary Oral Oral  SpO2: 98% 94% 94% 91%  Weight:      Height:        Wt Readings from Last 3 Encounters:  12/15/19 115.9 kg  12/05/19 117.9 kg  04/03/19 116.9 kg     Intake/Output Summary (Last 24 hours) at 12/18/2019 0916 Last data filed at 12/18/2019 0551 Gross per 24 hour  Intake 580 ml  Output 400 ml  Net 180 ml     Physical Exam  Awake Alert, No new F.N deficits, Normal affect [place].AT,PERRAL Supple Neck,No JVD, No cervical lymphadenopathy appriciated.  Symmetrical Chest wall movement, Good air movement bilaterally, CTAB RRR,No Gallops, Rubs or new Murmurs, No Parasternal Heave +ve B.Sounds, Abd Soft, No tenderness, No organomegaly appriciated, No rebound - guarding or rigidity. No Cyanosis, Clubbing or edema, No new Rash or bruise    Data Review:    CBC Recent Labs  Lab 12/15/19 1222 12/16/19 0325 12/17/19 0213 12/18/19 0309  WBC 8.0 7.0 17.0* 16.1*  HGB 11.6* 11.4* 11.1* 10.5*  HCT 35.4* 34.1* 33.0* 31.1*  PLT 328 347 397 445*  MCV 83.3 84.0 82.9 83.2  MCH 27.3 28.1 27.9 28.1  MCHC 32.8 33.4 33.6 33.8  RDW 13.5 13.7 13.9 14.4  LYMPHSABS 0.9 0.7 1.3 1.2  MONOABS 0.4 0.4 0.7 0.7  EOSABS 0.1 0.0 0.0 0.0  BASOSABS 0.0 0.0 0.0 0.0    Chemistries  Recent Labs  Lab 12/15/19 1222 12/16/19 0325 12/17/19 0213 12/18/19 0309  NA 137 138 139 139  K 3.3* 3.9 3.6 3.8  CL 100 101 104 104  CO2 '27 28 24 25  ' GLUCOSE 106* 157* 183* 169*  BUN '6 9 12 10  ' CREATININE 0.92 0.97 0.79 0.81  CALCIUM 8.2* 8.2* 8.5* 8.4*  AST '31 27 25 19  ' ALT '31 28 28 26  ' ALKPHOS 74 71 68 64  BILITOT 0.7 0.5 0.5 0.5  MG  --  2.6* 2.5* 2.4     ------------------------------------------------------------------------------------------------------------------ No results for input(s): CHOL, HDL, LDLCALC, TRIG, CHOLHDL, LDLDIRECT in the last 72 hours.  No results found for:  HGBA1C ------------------------------------------------------------------------------------------------------------------ No results for input(s): TSH, T4TOTAL, T3FREE, THYROIDAB in the last 72 hours.  Invalid input(s): FREET3  Cardiac Enzymes No results for input(s): CKMB, TROPONINI, MYOGLOBIN in the last 168 hours.  Invalid input(s): CK ------------------------------------------------------------------------------------------------------------------    Component Value Date/Time   BNP 50.2 12/18/2019 0309    Micro Results Recent Results (  from the past 240 hour(s))  Blood culture (routine x 2)     Status: None (Preliminary result)   Collection Time: 12/15/19 12:23 PM   Specimen: BLOOD  Result Value Ref Range Status   Specimen Description BLOOD RIGHT ANTECUBITAL  Final   Special Requests   Final    BOTTLES DRAWN AEROBIC AND ANAEROBIC Blood Culture adequate volume   Culture   Final    NO GROWTH 2 DAYS Performed at [facility], [address], [place], [place] [address]    Report Status PENDING  Incomplete  Blood culture (routine x 2)     Status: None (Preliminary result)   Collection Time: 12/15/19 12:42 PM   Specimen: BLOOD  Result Value Ref Range Status   Specimen Description BLOOD LEFT ANTECUBITAL  Final   Special Requests   Final    BOTTLES DRAWN AEROBIC AND ANAEROBIC Blood Culture adequate volume   Culture   Final    NO GROWTH 2 DAYS Performed at [facility], [place] [address], [place], [place] [address]    Report Status PENDING  Incomplete  SARS Coronavirus 2 by RT PCR (hospital order, performed in [facility] hospital lab) Nasopharyngeal Nasopharyngeal Swab     Status: Abnormal   Collection Time: 12/15/19  1:05 PM   Specimen: Nasopharyngeal Swab  Result Value Ref Range Status   SARS Coronavirus 2 POSITIVE (A) NEGATIVE Final    Comment: RESULT CALLED TO, READ BACK BY AND VERIFIED WITH: M. Ferdinand Lango RN 15:45 12/15/19 ([email or web address]) (NOTE) SARS-CoV-2 target  nucleic acids are DETECTED  SARS-CoV-2 RNA is generally detectable in upper respiratory specimens  during the acute phase of infection.  Positive results are indicative  of the presence of the identified virus, but do not rule out bacterial infection or co-infection with other pathogens not detected by the test.  Clinical correlation with patient history and  other diagnostic information is necessary to determine patient infection status.  The expected result is negative.  Fact Sheet for Patients:   StrictlyIdeas.no   Fact Sheet for Healthcare Providers:   BankingDealers.co.za    This test is not yet approved or cleared by the [place] FDA and  has been authorized for detection and/or diagnosis of SARS-CoV-2 by FDA under an Emergency Use Authorization (EUA).  This EUA [name] remain in effect (meaning this  test can be used) for the duration of  the COVID-19 declaration under Section 564(b)(1) of the Act, 21 U.S.C. section 360-bbb-3(b)(1), unless the authorization is terminated or revoked sooner.  Performed at [facility], [place] [address] [place], [place] [address]     Radiology Reports  CT Angio Chest PE W and/or Wo Contrast  Result Date: 12/15/2019 CLINICAL DATA:  Shortness of breath and weakness. EXAM: CT ANGIOGRAPHY CHEST WITH CONTRAST TECHNIQUE: Multidetector CT imaging of the chest was performed using the standard protocol during bolus administration of intravenous contrast. Multiplanar CT image reconstructions and MIPs were obtained to evaluate the vascular anatomy. CONTRAST:  67m OMNIPAQUE IOHEXOL 350 MG/ML SOLN COMPARISON:  None. FINDINGS: Cardiovascular: Satisfactory opacification of the pulmonary arteries to the segmental level. No evidence of pulmonary embolism. Normal heart size. No pericardial effusion. Mediastinum/Nodes: A 4.4 cm x 3.1 cm heterogeneous soft tissue mass is seen along the paratracheal region on the  right. This extends along the posterior aspect of the right hilum to the subcarinal region. Lungs/Pleura: Marked severity diffuse bilateral infiltrates are seen. Very small bilateral pleural effusions are noted. There is no  evidence of a pneumothorax. Upper Abdomen: No acute abnormality. Musculoskeletal: No chest wall abnormality. No acute or significant osseous findings. Review of the MIP images confirms the above findings. IMPRESSION: 1. Marked severity diffuse bilateral infiltrates. 2. Very small bilateral pleural effusions. 3. Heterogeneous right-sided paratracheal soft tissue mass which is worrisome for the presence of an underlying neoplasm. Electronically Signed   By: Virgina Norfolk M.D.   On: 12/15/2019 16:13   DG Chest Portable 1 View  Result Date: 12/15/2019 CLINICAL DATA:  COVID-19 positive, cough, shortness of breath, chest pain, hypoxia EXAM: PORTABLE CHEST 1 VIEW COMPARISON:  Portable exam 1230 hours compared to 12/05/2019 FINDINGS: Upper normal heart size. Mediastinal contours and pulmonary vascularity normal. Patchy airspace infiltrates bilaterally in the mid to lower lungs consistent with multifocal pneumonia significantly increased from prior study. No pleural effusion or pneumothorax. Osseous structures unremarkable. IMPRESSION: Significantly increased BILATERAL pulmonary infiltrates in the mid to lower lungs consistent with multifocal pneumonia. Electronically Signed   By: Lavonia Dana M.D.   On: 12/15/2019 13:09   DG Chest Portable 1 View  Result Date: 12/05/2019 CLINICAL DATA:  COVID-19 positivity EXAM: PORTABLE CHEST 1 VIEW COMPARISON:  None. FINDINGS: Cardiac shadow is within normal limits. The lungs are well aerated bilaterally. Patchy opacities are noted in the right lung base consistent with the given clinical history. No sizable effusion or pneumothorax is noted. No bony abnormality is seen. IMPRESSION: Patchy opacities in the right base consistent with the given clinical  history. Electronically Signed   By: Inez Catalina M.D.   On: 12/05/2019 22:02

## 2019-12-19 LAB — CBC WITH DIFFERENTIAL/PLATELET
Abs Immature Granulocytes: 0.7 10*3/uL — ABNORMAL HIGH (ref 0.00–0.07)
Basophils Absolute: 0 10*3/uL (ref 0.0–0.1)
Basophils Relative: 0 %
Eosinophils Absolute: 0 10*3/uL (ref 0.0–0.5)
Eosinophils Relative: 0 %
HCT: 31.9 % — ABNORMAL LOW (ref 36.0–46.0)
Hemoglobin: 10.7 g/dL — ABNORMAL LOW (ref 12.0–15.0)
Immature Granulocytes: 5 %
Lymphocytes Relative: 10 %
Lymphs Abs: 1.5 10*3/uL (ref 0.7–4.0)
MCH: 28.2 pg (ref 26.0–34.0)
MCHC: 33.5 g/dL (ref 30.0–36.0)
MCV: 83.9 fL (ref 80.0–100.0)
Monocytes Absolute: 0.7 10*3/uL (ref 0.1–1.0)
Monocytes Relative: 5 %
Neutro Abs: 11.5 10*3/uL — ABNORMAL HIGH (ref 1.7–7.7)
Neutrophils Relative %: 80 %
Platelets: 480 10*3/uL — ABNORMAL HIGH (ref 150–400)
RBC: 3.8 MIL/uL — ABNORMAL LOW (ref 3.87–5.11)
RDW: 14.7 % (ref 11.5–15.5)
WBC: 14.4 10*3/uL — ABNORMAL HIGH (ref 4.0–10.5)
nRBC: 0.3 % — ABNORMAL HIGH (ref 0.0–0.2)

## 2019-12-19 LAB — COMPREHENSIVE METABOLIC PANEL
ALT: 27 U/L (ref 0–44)
AST: 20 U/L (ref 15–41)
Albumin: 2.3 g/dL — ABNORMAL LOW (ref 3.5–5.0)
Alkaline Phosphatase: 60 U/L (ref 38–126)
Anion gap: 10 (ref 5–15)
BUN: 10 mg/dL (ref 6–20)
CO2: 26 mmol/L (ref 22–32)
Calcium: 8.4 mg/dL — ABNORMAL LOW (ref 8.9–10.3)
Chloride: 102 mmol/L (ref 98–111)
Creatinine, Ser: 0.82 mg/dL (ref 0.44–1.00)
GFR calc Af Amer: >60 mL/min (ref >60)
GFR calc non Af Amer: >60 mL/min (ref >60)
Glucose, Bld: 224 mg/dL — ABNORMAL HIGH (ref 70–99)
Potassium: 4.2 mmol/L (ref 3.5–5.1)
Sodium: 138 mmol/L (ref 135–145)
Total Bilirubin: 0.5 mg/dL (ref 0.3–1.2)
Total Protein: 5.7 g/dL — ABNORMAL LOW (ref 6.5–8.1)

## 2019-12-19 LAB — BRAIN NATRIURETIC PEPTIDE: B Natriuretic Peptide: 84.1 pg/mL (ref 0.0–100.0)

## 2019-12-19 LAB — PROCALCITONIN: Procalcitonin: <0.1 ng/mL

## 2019-12-19 LAB — D-DIMER, QUANTITATIVE: D-Dimer, Quant: 2.3 ug/mL-FEU — ABNORMAL HIGH (ref 0.00–0.50)

## 2019-12-19 LAB — C-REACTIVE PROTEIN: CRP: 8.2 mg/dL — ABNORMAL HIGH (ref <1.0)

## 2019-12-19 LAB — MAGNESIUM: Magnesium: 2.3 mg/dL (ref 1.7–2.4)

## 2019-12-19 MED ORDER — PREDNISONE 5 MG PO TABS
ORAL_TABLET | ORAL | 0 refills | Status: DC
Start: 2019-12-19 — End: 2020-01-19

## 2019-12-19 MED ORDER — APIXABAN 2.5 MG PO TABS
2.5000 mg | ORAL_TABLET | Freq: Two times a day (BID) | ORAL | 0 refills | Status: DC
Start: 2019-12-19 — End: 2020-01-01

## 2019-12-19 MED ORDER — ALBUTEROL SULFATE HFA 108 (90 BASE) MCG/ACT IN AERS: 1.0000 | INHALATION_SPRAY | RESPIRATORY_TRACT | 0 refills | Status: DC | PRN

## 2019-12-19 NOTE — Progress Notes (Signed)
SATURATION QUALIFICATIONS: (This note is used to comply with regulatory documentation for home oxygen)  Patient Saturations on Room Air at Rest = 90%  Patient Saturations on Room Air while Ambulating = 84%  Patient Saturations on 3 Liters of oxygen while Ambulating = 89%  Please briefly explain why patient needs home oxygen:

## 2019-12-19 NOTE — Progress Notes (Signed)
D/c instructions provided to pt. Oxygen at bedside. Home health to deliver concentrator and [facility]. Pt has no question sta this time.

## 2019-12-19 NOTE — Discharge Summary (Signed)
Beth [name] TDH:[id number] DOB: Nov 06, 1975 DOA: 12/15/2019  PCP: Patient, No Pcp Per  Admit date: 12/15/2019  Discharge date: 12/19/2019  Admitted From: Home   Disposition:  Home   Recommendations for Outpatient Follow-up:   Follow up with PCP in 1-2 weeks  PCP Please obtain BMP/CBC, 2 view CXR in 1week,  (see Discharge instructions)   PCP Please follow up on the following pending results:    Home Health: PT,RN   Equipment/Devices: 2lit O2 PRN  Consultations: PCCM Discharge Condition: Stable    CODE STATUS: Full    Diet Recommendation: Heart Healthy   Diet Order            Diet - low sodium heart healthy           Diet regular Room service appropriate? Yes; Fluid consistency: Thin  Diet effective now                  Chief Complaint  Patient presents with  . Shortness of Breath  . Weakness  . Emesis     Brief history of present illness from the day of admission and additional interim summary    Beth Robinsonis a 44 y.o.femalewith medical history significant ofasthma, chronic pancreatitis,andprolactinoma presents with complaints of progressively worsening productive cough and shortness of breath, she was diagnosed with acute hypoxic respiratory failure due to COVID-19 pneumonia and admitted to the hospital.                                                                 Hospital Course    1. Acute Hypoxic Resp. Failure due to Acute Covid 19 Viral Pneumonitis during the ongoing 2020 Covid 19 Pandemic - she had moderate to severe disease and was treated with IV steroids and remdesivir course, will finish her remdesivir course and subsequently will be placed on oral steroid taper, also due to inflammation elevated D-dimer with very high risk of developing DVT hence 2 weeks of prophylactic  Eliquis upon discharge.  CTA and leg ultrasound did not show any acute clots thankfully so far.  She is much better and symptom-free at rest, on rest she is on room air upon ambulation she does desat and will be given 2 L of nasal cannula oxygen as needed.  She will follow with PCP and pulmonary outpatient in 1 to 2 weeks.    SpO2: 95 % O2 Flow Rate (L/min): 2 L/min  Recent Labs  Lab 12/15/19 1222 12/15/19 1305 12/15/19 2037 12/16/19 0325 12/17/19 0213 12/18/19 0309 12/19/19 0349  CRP  --   --  32.3* 33.4* 22.5* 13.0* 8.2*  DDIMER 2.05*  --   --  2.70* 2.32* 2.10* 2.30*  FERRITIN  --   --  1,360* 1,272*  --   --   --   BNP  --   --  22.2 13.5 39.3 50.2 84.1  PROCALCITON  --   --  0.14 0.12 <0.10 <0.10 <0.10  SARSCOV2NAA  --  POSITIVE*  --   --   --   --   --     Hepatic Function Latest Ref Rng & Units 12/19/2019 12/18/2019 12/17/2019  Total Protein 6.5 - 8.1 g/dL 5.7(L) 5.9(L) 6.3(L)  Albumin 3.5 - 5.0 g/dL 2.3(L) 2.3(L) 2.4(L)  AST 15 - 41 U/L _0 ALT 0 - 44 U/L _1 Alk Phosphatase 38 - 126 U/L 60 64 68  Total Bilirubin 0.3 - 1.2 mg/dL 0.5 0.5 0.5    2. Heterogeneous right-sided paratracheal soft tissue mass which is worrisome for the presence of an underlying neoplasm - incidentally on CT scan chest, seen by pulmonary critical care plan is to follow with them outpatient for possible repeat CT scan and bronchoscopy directed biopsy.  3.  Pancreatitis.  Stable no acute issues.  Continue chronic Creon supplementation.   4.  Dyslipidemia.  Continue statin.  5.  GERD.  PPI.  6.  Moderately increased D-dimer due to inflammation from Covid, very high risk for developing a blood clot in the setting of inflammation.  Was on high-dose Lovenox here, CTA and leg ultrasound unremarkable.  2 weeks of Eliquis upon discharge at prophylactic dose as NSAIDs of developing a clot are very high in the light of severe Covid induced inflammation.   Discharge diagnosis      Principal Problem:   Pneumonia due to COVID-19 virus Active Problems:   Hyperprolactinemia ([place])   Chronic pancreatitis ([place])   Acute respiratory failure with hypoxia (H[facility])   Hypokalemia   Asthma without acute exacerbation    Discharge instructions    Discharge Instructions    Diet - low sodium heart healthy   Complete by: As directed    Discharge instructions   Complete by: As directed    Follow with Primary MD in 7 days   Get CBC, CMP, 2 view Chest X ray -  checked next visit within 1 week by Primary MD   Activity: As tolerated with Full fall precautions use walker/cane & assistance as needed  Disposition Home    Diet: Heart Healthy    Special Instructions: If you have smoked or chewed Tobacco  in the last 2 yrs please stop smoking, stop any regular Alcohol  and or any Recreational drug use.  On your next visit with your primary care physician please Get Medicines reviewed and adjusted.  Please request your Prim.MD to go over all Hospital Tests and Procedure/Radiological results at the follow up, please get all Hospital records sent to your Prim MD by signing hospital release before you go home.  If you experience worsening of your admission symptoms, develop shortness of breath, life threatening emergency, suicidal or homicidal thoughts you must seek medical attention immediately by calling 911 or calling your MD immediately  if symptoms less severe.  You Must read complete instructions/literature along with all the possible adverse reactions/side effects for all the Medicines you take and that have been prescribed to you. Take any new Medicines after you have completely understood and accpet all the possible adverse reactions/side effects.   Increase activity slowly   Complete by: As directed    MyChart COVID-19 home monitoring program   Complete by: Dec 19, 2019    Is the patient willing to use the [facility] for home monitoring?: Yes   Temperature  monitoring  Complete by: Dec 19, 2019    After how many days would you like to receive a notification of this patient's flowsheet entries?: 1      Discharge Medications   Allergies as of 12/19/2019      Reactions   Bahia Swelling   [place] Grass Hives   Cedar Swelling   Cladosporium Cladosporioides Hives   Dust Mite Extract Hives   Elm Bark [ulmus Fulva] Swelling   Lidocaine Hcl Swelling   Molds & Smuts Hives   Mugwort Hives   Nettle [urtica Dioica] Hives   Sorrel-dock Mix [sheep Sorrel-yellow Dock] Hives   Timothy Grass Pollen Allergen Hives   Xylocaine [lidocaine] Swelling      Medication List    STOP taking these medications   naproxen sodium 220 MG tablet Commonly known as: ALEVE   predniSONE 20 MG tablet Commonly known as: DELTASONE Replaced by: predniSONE 5 MG tablet     TAKE these medications   albuterol 108 (90 Base) MCG/ACT inhaler Commonly known as: ProAir HFA Inhale 1 puff into the lungs every 4 (four) hours as needed for wheezing or shortness of breath. What changed: when to take this   apixaban 2.5 MG Tabs tablet Commonly known as: Eliquis Take 1 tablet (2.5 mg total) by mouth 2 (two) times daily.   atorvastatin 10 MG tablet Commonly known as: LIPITOR Take 10 mg by mouth daily.   bromocriptine 2.5 MG tablet Commonly known as: PARLODEL Take 0.5 tablets (1.25 mg total) by mouth at bedtime.   Creon 24000-76000 units Cpep Generic drug: Pancrelipase (Lip-Prot-Amyl) Take 2 capsules by mouth See admin instructions. Take 2 capsules three times daily with food and 1 capsule twice daily with snacks.   EpiPen 2-Pak 0.3 mg/0.3 mL Soaj injection Generic drug: EPINEPHrine Inject 0.3 mg into the muscle once.   hydrochlorothiazide 12.5 MG tablet Commonly known as: HYDRODIURIL Take 12.5 mg by mouth daily.   levonorgestrel 20 MCG/24HR IUD Commonly known as: MIRENA 1 each by Intrauterine route once.   mometasone-formoterol 100-5 MCG/ACT Aero Commonly  known as: DULERA Inhale 2 puffs into the lungs daily.   naftifine 1 % cream Commonly known as: NAFTIN Apply 1 application topically daily as needed (fungs infection).   omega-3 fish oil 1000 MG Caps capsule Commonly known as: MAXEPA Take 2 capsules by mouth daily.   ondansetron 4 MG disintegrating tablet Commonly known as: ZOFRAN-ODT Take 4 mg by mouth every 8 (eight) hours as needed for nausea or vomiting.   pantoprazole 20 MG tablet Commonly known as: PROTONIX Take 20 mg by mouth daily.   predniSONE 5 MG tablet Commonly known as: DELTASONE Label  & dispense according to the schedule below. take 8 Pills PO for 3 days, 6 Pills PO for 3 days, 4 Pills PO for 3 days, 2 Pills PO for 3 days, 1 Pills PO for 3 days, 1/2 Pill  PO for 3 days then STOP. Total 65 pills. Replaces: predniSONE 20 MG tablet   tizanidine 2 MG capsule Commonly known as: ZANAFLEX Take 2 mg by mouth 3 (three) times daily as needed for muscle spasms.            Durable Medical Equipment  (From admission, onward)         Start     Ordered   12/19/19 0825  For home use only DME oxygen  Once       Question Answer Comment  Length of Need 6 Months   Mode or (Route) Nasal cannula  Liters per Minute 2   Frequency Continuous (stationary and portable oxygen unit needed)   Oxygen delivery system Gas      12/19/19 0824           Follow-up Information    [facility]. Schedule an appointment as soon as possible for a visit in 1 week(s).   Specialty: Internal Medicine Why: Or with your PCP in 1 week. Contact information: [address] [id number] [place] [address] [phone]       Smith, Daniel C, MD. Schedule an appointment as soon as possible for a visit in 2 week(s).   Specialty: Pulmonary Disease Why: Covid pneumonia, possible lung mass Contact information: [address] [place] [address] [phone]               Major  procedures and Radiology Reports - PLEASE review detailed and final reports thoroughly  -       CT Angio Chest PE W and/or Wo Contrast  Result Date: 12/15/2019 CLINICAL DATA:  Shortness of breath and weakness. EXAM: CT ANGIOGRAPHY CHEST WITH CONTRAST TECHNIQUE: Multidetector CT imaging of the chest was performed using the standard protocol during bolus administration of intravenous contrast. Multiplanar CT image reconstructions and MIPs were obtained to evaluate the vascular anatomy. CONTRAST:  75mL OMNIPAQUE IOHEXOL 350 MG/ML SOLN COMPARISON:  None. FINDINGS: Cardiovascular: Satisfactory opacification of the pulmonary arteries to the segmental level. No evidence of pulmonary embolism. Normal heart size. No pericardial effusion. Mediastinum/Nodes: A 4.4 cm x 3.1 cm heterogeneous soft tissue mass is seen along the paratracheal region on the right. This extends along the posterior aspect of the right hilum to the subcarinal region. Lungs/Pleura: Marked severity diffuse bilateral infiltrates are seen. Very small bilateral pleural effusions are noted. There is no evidence of a pneumothorax. Upper Abdomen: No acute abnormality. Musculoskeletal: No chest wall abnormality. No acute or significant osseous findings. Review of the MIP images confirms the above findings. IMPRESSION: 1. Marked severity diffuse bilateral infiltrates. 2. Very small bilateral pleural effusions. 3. Heterogeneous right-sided paratracheal soft tissue mass which is worrisome for the presence of an underlying neoplasm. Electronically Signed   By: Thaddeus  Houston M.D.   On: 12/15/2019 16:13   DG Chest Portable 1 View  Result Date: 12/15/2019 CLINICAL DATA:  COVID-19 positive, cough, shortness of breath, chest pain, hypoxia EXAM: PORTABLE CHEST 1 VIEW COMPARISON:  Portable exam 1230 hours compared to 12/05/2019 FINDINGS: Upper normal heart size. Mediastinal contours and pulmonary vascularity normal. Patchy airspace infiltrates bilaterally in  the mid to lower lungs consistent with multifocal pneumonia significantly increased from prior study. No pleural effusion or pneumothorax. Osseous structures unremarkable. IMPRESSION: Significantly increased BILATERAL pulmonary infiltrates in the mid to lower lungs consistent with multifocal pneumonia. Electronically Signed   By: Mark  Boles M.D.   On: 12/15/2019 13:09   DG Chest Portable 1 View  Result Date: 12/05/2019 CLINICAL DATA:  COVID-19 positivity EXAM: PORTABLE CHEST 1 VIEW COMPARISON:  None. FINDINGS: Cardiac shadow is within normal limits. The lungs are well aerated bilaterally. Patchy opacities are noted in the right lung base consistent with the given clinical history. No sizable effusion or pneumothorax is noted. No bony abnormality is seen. IMPRESSION: Patchy opacities in the right base consistent with the given clinical history. Electronically Signed   By: Mark  Lukens M.D.   On: 12/05/2019 22:02   VAS US LOWER EXTREMITY VENOUS (DVT)  Result Date: 12/18/2019  Lower Venous DVTStudy Indications: D dimer, Covid.    Performing Technologist: Jill Parker RDMS, RVT  Examination Guidelines: A complete evaluation includes B-mode imaging, spectral Doppler, color Doppler, and power Doppler as needed of all accessible portions of each vessel. Bilateral testing is considered an integral part of a complete examination. Limited examinations for reoccurring indications may be performed as noted. The reflux portion of the exam is performed with the patient in reverse Trendelenburg.  +---------+---------------+---------+-----------+----------+--------------+ RIGHT    CompressibilityPhasicitySpontaneityPropertiesThrombus Aging +---------+---------------+---------+-----------+----------+--------------+ CFV      Full           Yes      Yes                                 +---------+---------------+---------+-----------+----------+--------------+ SFJ      Full                                                         +---------+---------------+---------+-----------+----------+--------------+ FV Prox  Full                                                        +---------+---------------+---------+-----------+----------+--------------+ FV Mid   Full                                                        +---------+---------------+---------+-----------+----------+--------------+ FV DistalFull                                                        +---------+---------------+---------+-----------+----------+--------------+ PFV      Full                                                        +---------+---------------+---------+-----------+----------+--------------+ POP      Full           Yes      Yes                                 +---------+---------------+---------+-----------+----------+--------------+ PTV      Full                                                        +---------+---------------+---------+-----------+----------+--------------+ PERO     Full                                                        +---------+---------------+---------+-----------+----------+--------------+   +---------+---------------+---------+-----------+----------+--------------+   LEFT     CompressibilityPhasicitySpontaneityPropertiesThrombus Aging +---------+---------------+---------+-----------+----------+--------------+ CFV      Full           Yes      Yes                                 +---------+---------------+---------+-----------+----------+--------------+ SFJ      Full                                                        +---------+---------------+---------+-----------+----------+--------------+ FV Prox  Full                                                        +---------+---------------+---------+-----------+----------+--------------+ FV Mid   Full                                                         +---------+---------------+---------+-----------+----------+--------------+ FV DistalFull                                                        +---------+---------------+---------+-----------+----------+--------------+ PFV      Full                                                        +---------+---------------+---------+-----------+----------+--------------+ POP      Full           Yes      Yes                                 +---------+---------------+---------+-----------+----------+--------------+ PTV      Full                                                        +---------+---------------+---------+-----------+----------+--------------+ PERO     Full                                                        +---------+---------------+---------+-----------+----------+--------------+     Summary: BILATERAL: - No evidence of deep vein thrombosis seen in the lower extremities, bilaterally. -No evidence of popliteal cyst, bilaterally.   *See table(s) above for measurements and observations. Electronically signed by Brandon Cain MD on 12/18/2019 at 9:02:15 PM.      Final     Micro Results     Recent Results (from the past 240 hour(s))  Blood culture (routine x 2)     Status: None (Preliminary result)   Collection Time: 12/15/19 12:23 PM   Specimen: BLOOD  Result Value Ref Range Status   Specimen Description BLOOD RIGHT ANTECUBITAL  Final   Special Requests   Final    BOTTLES DRAWN AEROBIC AND ANAEROBIC Blood Culture adequate volume   Culture   Final    NO GROWTH 3 DAYS Performed at [facility], [address] [place], [place] [address]    Report Status PENDING  Incomplete  Blood culture (routine x 2)     Status: None (Preliminary result)   Collection Time: 12/15/19 12:42 PM   Specimen: BLOOD  Result Value Ref Range Status   Specimen Description BLOOD LEFT ANTECUBITAL  Final   Special Requests   Final    BOTTLES DRAWN AEROBIC AND ANAEROBIC Blood  Culture adequate volume   Culture   Final    NO GROWTH 3 DAYS Performed at [facility], [address] [place], [place] [address]    Report Status PENDING  Incomplete  SARS Coronavirus 2 by RT PCR (hospital order, performed in [place] hospital lab) Nasopharyngeal Nasopharyngeal Swab     Status: Abnormal   Collection Time: 12/15/19  1:05 PM   Specimen: Nasopharyngeal Swab  Result Value Ref Range Status   SARS Coronavirus 2 POSITIVE (A) NEGATIVE Final    Comment: RESULT CALLED TO, READ BACK BY AND VERIFIED WITH: M. Peters RN 15:45 12/15/19 ([email or web address]) (NOTE) SARS-CoV-2 target nucleic acids are DETECTED  SARS-CoV-2 RNA is generally detectable in upper respiratory specimens  during the acute phase of infection.  Positive results are indicative  of the presence of the identified virus, but do not rule out bacterial infection or co-infection with other pathogens not detected by the test.  Clinical correlation with patient history and  other diagnostic information is necessary to determine patient infection status.  The expected result is negative.  Fact Sheet for Patients:   https://www.fda.gov/media/136312/download   Fact Sheet for Healthcare Providers:   https://www.fda.gov/media/136313/download    This test is not yet approved or cleared by the [place] FDA and  has been authorized for detection and/or diagnosis of SARS-CoV-2 by FDA under an Emergency Use Authorization (EUA).  This EUA will remain in effect (meaning this  test can be used) for the duration of  the COVID-19 declaration under Section 564(b)(1) of the Act, 21 U.S.C. section 360-bbb-3(b)(1), unless the authorization is terminated or revoked sooner.  Performed at [facility], [address] [place], [place] [address]     Today   Subjective    Mitzi Caradonna today has no headache,no chest abdominal pain,no new weakness tingling or numbness, feels much better     Objective   Blood  pressure 124/77, pulse 81, temperature 98 F (36.7 C), temperature source Oral, resp. rate 16, height 5' 9" (1.753 m), weight 115.9 kg, last menstrual period 12/15/2019, SpO2 95 %.   Intake/Output Summary (Last 24 hours) at 12/19/2019 0836 Last data filed at 12/19/2019 0545 Gross per 24 hour  Intake 1000 ml  Output --  Net 1000 ml    Exam  Awake Alert, No new F.N deficits, Normal affect .AT,PERRAL Supple Neck,No JVD, No cervical lymphadenopathy appriciated.  Symmetrical Chest wall movement, Good air movement bilaterally, CTAB RRR,No Gallops,Rubs or new Murmurs, No Parasternal Heave +ve B.Sounds,   Abd Soft, Non tender, No organomegaly appriciated, No rebound -guarding or rigidity. No Cyanosis, Clubbing or edema, No new Rash or bruise   Data Review   CBC w Diff:  Lab Results  Component Value Date   WBC 14.4 (H) 12/19/2019   HGB 10.7 (L) 12/19/2019   HCT 31.9 (L) 12/19/2019   PLT 480 (H) 12/19/2019   LYMPHOPCT 10 12/19/2019   MONOPCT 5 12/19/2019   EOSPCT 0 12/19/2019   BASOPCT 0 12/19/2019    CMP:  Lab Results  Component Value Date   NA 138 12/19/2019   K 4.2 12/19/2019   CL 102 12/19/2019   CO2 26 12/19/2019   BUN 10 12/19/2019   CREATININE 0.82 12/19/2019   PROT 5.7 (L) 12/19/2019   ALBUMIN 2.3 (L) 12/19/2019   BILITOT 0.5 12/19/2019   ALKPHOS 60 12/19/2019   AST 20 12/19/2019   ALT 27 12/19/2019  .   Total Time in preparing paper work, data evaluation and todays exam - 35 minutes  Prashant Singh M.D on 12/19/2019 at 8:36 AM  [facility]   Office  [phone] 

## 2019-12-19 NOTE — TOC Transition Note (Signed)
Transition of Care [facility]) - CM/SW Discharge Note   Patient Details  Name: Beth [name] MRN: [id number] Date of Birth: Jan 15, 1976  Transition of [facility]) CM/SW Contact:  Pollie Friar, RN Phone Number: 12/19/2019, 12:22 PM   Clinical Narrative:    Pt discharging home with [facility] services through [place]. Cory with [facility] accepted the referral.  Pt discharging with home oxygen and 3 in 1. Jermaine with [employer] will have oxygen delivered to the home and a tank to her hospital room for transport home. Pt discharging on Eliquis. CM has provided the bedside RN coupons for 30 days free and $10 copay. CM spoke to the pt over the phone about the cards and voiced understanding. Pt is without a PcP. CM has asked the Covid clinic at [facility] for f/u and they will contact the patient for an appt. Pt states her sister can provide transport home.     Final next level of care: Home w Home Health Services Barriers to Discharge: No Barriers Identified   Patient Goals and CMS Choice   CMS Medicare.gov Compare Post Acute Care list provided to:: Patient Choice offered to / list presented to : Patient  Discharge Placement                       Discharge Plan and Services                DME Arranged: 3-N-1, Oxygen DME Agency:  [facility] Aver) Date DME Agency Contacted: 12/19/19   Representative spoke with at DME Agency: Clarisse Gouge HH Arranged: PT, RN [place] Agency: [place] Date [place]: 12/19/19   Representative spoke with at [place]: [place] ([place]) Interventions     Readmission Risk Interventions No flowsheet data found.

## 2019-12-20 LAB — CULTURE, BLOOD (ROUTINE X 2)
Culture: NO GROWTH
Culture: NO GROWTH
Special Requests: ADEQUATE
Special Requests: ADEQUATE

## 2019-12-23 ENCOUNTER — Telehealth: Payer: Self-pay

## 2019-12-24 DIAGNOSIS — U071 COVID-19: Secondary | ICD-10-CM | POA: Diagnosis not present

## 2019-12-24 DIAGNOSIS — J1281 Pneumonia due to SARS-associated coronavirus: Secondary | ICD-10-CM | POA: Diagnosis not present

## 2019-12-26 ENCOUNTER — Telehealth: Payer: Self-pay | Admitting: *Deleted

## 2019-12-26 DIAGNOSIS — E876 Hypokalemia: Secondary | ICD-10-CM | POA: Diagnosis not present

## 2019-12-26 DIAGNOSIS — Z7901 Long term (current) use of anticoagulants: Secondary | ICD-10-CM | POA: Diagnosis not present

## 2019-12-26 DIAGNOSIS — J1282 Pneumonia due to coronavirus disease 2019: Secondary | ICD-10-CM | POA: Diagnosis not present

## 2019-12-26 DIAGNOSIS — K219 Gastro-esophageal reflux disease without esophagitis: Secondary | ICD-10-CM | POA: Diagnosis not present

## 2019-12-26 DIAGNOSIS — Z9981 Dependence on supplemental oxygen: Secondary | ICD-10-CM | POA: Diagnosis not present

## 2019-12-26 DIAGNOSIS — J45909 Unspecified asthma, uncomplicated: Secondary | ICD-10-CM | POA: Diagnosis not present

## 2019-12-26 DIAGNOSIS — K861 Other chronic pancreatitis: Secondary | ICD-10-CM | POA: Diagnosis not present

## 2019-12-26 DIAGNOSIS — R918 Other nonspecific abnormal finding of lung field: Secondary | ICD-10-CM | POA: Diagnosis not present

## 2019-12-26 DIAGNOSIS — Z7952 Long term (current) use of systemic steroids: Secondary | ICD-10-CM | POA: Diagnosis not present

## 2019-12-26 DIAGNOSIS — U071 COVID-19: Secondary | ICD-10-CM | POA: Diagnosis not present

## 2019-12-26 DIAGNOSIS — J90 Pleural effusion, not elsewhere classified: Secondary | ICD-10-CM | POA: Diagnosis not present

## 2019-12-26 DIAGNOSIS — M799 Soft tissue disorder, unspecified: Secondary | ICD-10-CM | POA: Diagnosis not present

## 2019-12-26 DIAGNOSIS — E785 Hyperlipidemia, unspecified: Secondary | ICD-10-CM | POA: Diagnosis not present

## 2019-12-26 DIAGNOSIS — J9601 Acute respiratory failure with hypoxia: Secondary | ICD-10-CM | POA: Diagnosis not present

## 2019-12-26 DIAGNOSIS — E221 Hyperprolactinemia: Secondary | ICD-10-CM | POA: Diagnosis not present

## 2019-12-26 DIAGNOSIS — R000 Tachycardia, unspecified: Secondary | ICD-10-CM | POA: Diagnosis not present

## 2019-12-26 NOTE — Telephone Encounter (Addendum)
[place] requested orders on patient. Per Beth patient has COVID 19 and needs closer monitoring. Informed Beth that patient would need to be seen or have an appointment with patient before orders can be released. Patient appointment was changed from 01/14/2020 to 01/01/2020 with [name], NP.   Beth contact is [phone].

## 2019-12-29 ENCOUNTER — Encounter (INDEPENDENT_AMBULATORY_CARE_PROVIDER_SITE_OTHER): Payer: Self-pay

## 2019-12-29 DIAGNOSIS — E876 Hypokalemia: Secondary | ICD-10-CM | POA: Diagnosis not present

## 2019-12-29 DIAGNOSIS — R918 Other nonspecific abnormal finding of lung field: Secondary | ICD-10-CM | POA: Diagnosis not present

## 2019-12-29 DIAGNOSIS — K219 Gastro-esophageal reflux disease without esophagitis: Secondary | ICD-10-CM | POA: Diagnosis not present

## 2019-12-29 DIAGNOSIS — Z7952 Long term (current) use of systemic steroids: Secondary | ICD-10-CM | POA: Diagnosis not present

## 2019-12-29 DIAGNOSIS — K861 Other chronic pancreatitis: Secondary | ICD-10-CM | POA: Diagnosis not present

## 2019-12-29 DIAGNOSIS — E221 Hyperprolactinemia: Secondary | ICD-10-CM | POA: Diagnosis not present

## 2019-12-29 DIAGNOSIS — U071 COVID-19: Secondary | ICD-10-CM | POA: Diagnosis not present

## 2019-12-29 DIAGNOSIS — Z7901 Long term (current) use of anticoagulants: Secondary | ICD-10-CM | POA: Diagnosis not present

## 2019-12-29 DIAGNOSIS — J1282 Pneumonia due to coronavirus disease 2019: Secondary | ICD-10-CM | POA: Diagnosis not present

## 2019-12-29 DIAGNOSIS — R000 Tachycardia, unspecified: Secondary | ICD-10-CM | POA: Diagnosis not present

## 2019-12-29 DIAGNOSIS — Z9981 Dependence on supplemental oxygen: Secondary | ICD-10-CM | POA: Diagnosis not present

## 2019-12-29 DIAGNOSIS — J90 Pleural effusion, not elsewhere classified: Secondary | ICD-10-CM | POA: Diagnosis not present

## 2019-12-29 DIAGNOSIS — J9601 Acute respiratory failure with hypoxia: Secondary | ICD-10-CM | POA: Diagnosis not present

## 2019-12-29 DIAGNOSIS — J45909 Unspecified asthma, uncomplicated: Secondary | ICD-10-CM | POA: Diagnosis not present

## 2019-12-29 DIAGNOSIS — E785 Hyperlipidemia, unspecified: Secondary | ICD-10-CM | POA: Diagnosis not present

## 2019-12-29 DIAGNOSIS — M799 Soft tissue disorder, unspecified: Secondary | ICD-10-CM | POA: Diagnosis not present

## 2019-12-30 ENCOUNTER — Telehealth: Payer: Self-pay

## 2019-12-30 NOTE — Telephone Encounter (Signed)
Copied from [place] [id number] [id number]. Topic: Quick Communication - Home Health Verbal Orders >> Dec 24, 2019  4:46 PM Erick Blinks wrote: [phone] Fortino Sic called requesting home health nursing. Patient establishes 01/14/2020. Beth wanted to see if approving these orders was possible prior to the pt establishing.  2 w 3  1 w1 nursing >> Dec 25, 2019  4:39 PM Erick Blinks wrote: Eustaquio Maize called back for status update on request. Tried contacting [facility], please advise   **NOT A PT AT [facility].

## 2020-01-01 ENCOUNTER — Other Ambulatory Visit: Payer: Self-pay

## 2020-01-01 ENCOUNTER — Encounter (INDEPENDENT_AMBULATORY_CARE_PROVIDER_SITE_OTHER): Payer: Self-pay

## 2020-01-01 ENCOUNTER — Encounter (INDEPENDENT_AMBULATORY_CARE_PROVIDER_SITE_OTHER): Payer: Self-pay | Admitting: Primary Care

## 2020-01-01 ENCOUNTER — Telehealth (INDEPENDENT_AMBULATORY_CARE_PROVIDER_SITE_OTHER): Payer: BC Managed Care – PPO | Admitting: Primary Care

## 2020-01-01 DIAGNOSIS — Z7689 Persons encountering health services in other specified circumstances: Secondary | ICD-10-CM | POA: Diagnosis not present

## 2020-01-01 DIAGNOSIS — Z09 Encounter for follow-up examination after completed treatment for conditions other than malignant neoplasm: Secondary | ICD-10-CM | POA: Diagnosis not present

## 2020-01-01 DIAGNOSIS — R5381 Other malaise: Secondary | ICD-10-CM

## 2020-01-01 DIAGNOSIS — J1282 Pneumonia due to coronavirus disease 2019: Secondary | ICD-10-CM

## 2020-01-01 DIAGNOSIS — U071 COVID-19: Secondary | ICD-10-CM

## 2020-01-01 MED ORDER — APIXABAN 2.5 MG PO TABS: 2.5000 mg | ORAL_TABLET | Freq: Two times a day (BID) | ORAL | 0 refills | Status: DC

## 2020-01-01 NOTE — Progress Notes (Signed)
Pt needs home health orders and FMLA forms completed

## 2020-01-01 NOTE — Progress Notes (Signed)
Virtual Visit via Telephone Note  I connected with Beth [name] on 01/01/20 at  1:30 PM EDT by telephone and verified that I am speaking with the correct person using two identifiers.   I discussed the limitations, risks, security and privacy concerns of performing an evaluation and management service by telephone and the availability of in person appointments. I also discussed with the patient that there may be a patient responsible charge related to this service. The patient expressed understanding and agreed to proceed.   History of Present Illness: [name] 44 y.o.female presents for follow up from the hospital. Admit date to the hospital was 12/15/19, patient was discharged from the hospital on 12/19/19, patient was admitted for: Acute Hypoxic Resp. Failure due to Acute Covid 19 Viral Pneumonitis during the ongoing 2020 Covid 19 Pandemic and establishment of care.  Past Medical History:  Diagnosis Date  . Asthma   . Chronic pancreatitis ([place])   . Hypoglycemia   . Prolactinoma (HCC)      Allergies  Allergen Reactions  . Salamonia Swelling  . [place] Grass Hives  . Cedar Swelling  . Cladosporium Cladosporioides Hives  . Dust Mite Extract Hives  . Elm Bark [Ulmus Fulva] Swelling  . Lidocaine Hcl Swelling  . Molds & Smuts Hives  . Mugwort Hives  . Nettle [Urtica Dioica] Hives  . Sorrel-Dock Mix [Sheep Sorrel-Yellow Dock] Hives  . Timothy Grass Pollen Allergen Hives  . Xylocaine [Lidocaine] Swelling      Current Outpatient Medications on File Prior to Visit  Medication Sig Dispense Refill  . atorvastatin (LIPITOR) 10 MG tablet Take 10 mg by mouth daily.     . bromocriptine (PARLODEL) 2.5 MG tablet Take 0.5 tablets (1.25 mg total) by mouth at bedtime. 45 tablet 3  . EPINEPHrine (EPIPEN 2-PAK) 0.3 mg/0.3 mL IJ SOAJ injection Inject 0.3 mg into the muscle once.     . hydrochlorothiazide (HYDRODIURIL) 12.5 MG tablet Take 12.5 mg by mouth daily.    Marland Kitchen levonorgestrel  (MIRENA) 20 MCG/24HR IUD 1 each by Intrauterine route once.     . mometasone-formoterol (DULERA) 100-5 MCG/ACT AERO Inhale 2 puffs into the lungs daily.     . naftifine (NAFTIN) 1 % cream Apply 1 application topically daily as needed (fungs infection).     Marland Kitchen omega-3 fish oil (MAXEPA) 1000 MG CAPS capsule Take 2 capsules by mouth daily.     . Pancrelipase, Lip-Prot-Amyl, (CREON) 24000-76000 units CPEP Take 2 capsules by mouth See admin instructions. Take 2 capsules three times daily with food and 1 capsule twice daily with snacks.    . pantoprazole (PROTONIX) 20 MG tablet Take 20 mg by mouth daily.    . predniSONE (DELTASONE) 5 MG tablet Label  & dispense according to the schedule below. take 8 Pills PO for 3 days, 6 Pills PO for 3 days, 4 Pills PO for 3 days, 2 Pills PO for 3 days, 1 Pills PO for 3 days, 1/2 Pill  PO for 3 days then STOP. Total 65 pills. 65 tablet 0  . albuterol (PROAIR HFA) 108 (90 Base) MCG/ACT inhaler Inhale 1 puff into the lungs every 4 (four) hours as needed for wheezing or shortness of breath. 6.7 g 0   No current facility-administered medications on file prior to visit.   Observations/Objective: ROS Assessment and Plan: Beth [name] was seen today for new patient (initial visit).  Diagnoses and all orders for this visit:  Pneumonia due to COVID-19 virus -     DG  Chest 2 View; Future -     CBC with Differential/Platelet; Future -     CMP14+EGFR; Future -     apixaban (ELIQUIS) 2.5 MG TABS tablet; Take 1 tablet (2.5 mg total) by mouth 2 (two) times daily.  Physical deconditioning -     Ambulatory referral to [facility] discharge follow-up Discharge instructions PCP Please obtain BMP/CBC, 2 view CXR in 1 week-orders placed patient is aware to go to radiology for chest x-ray and present to the office for labs.  Orders also placed by PCP PT/RN  And discharge on 2 liters of oxygen follow up with pulmonology to determine length of O2 need  Encounter to  establish care [name], NP-C will be your  (PCP) she is mastered prepared . Able to diagnosed and treatment also  answer health concern as well as continuing care of varied medical conditions, not limited by cause, organ system, or diagnosis.     I discussed the assessment and treatment plan with the patient. The patient was provided an opportunity to ask questions and all were answered. The patient agreed with the plan and demonstrated an understanding of the instructions.   The patient was advised to call back or seek an in-person evaluation if the symptoms worsen or if the condition fails to improve as anticipated.  I provided 22 minutes of non-face-to-face time during this encounter. This includes reviewing of  encounters labs and imaging   [name], NP

## 2020-01-02 ENCOUNTER — Telehealth: Payer: Self-pay

## 2020-01-02 ENCOUNTER — Telehealth (INDEPENDENT_AMBULATORY_CARE_PROVIDER_SITE_OTHER): Payer: Self-pay

## 2020-01-02 ENCOUNTER — Other Ambulatory Visit (INDEPENDENT_AMBULATORY_CARE_PROVIDER_SITE_OTHER): Payer: BC Managed Care – PPO

## 2020-01-02 ENCOUNTER — Ambulatory Visit (HOSPITAL_COMMUNITY)
Admission: RE | Admit: 2020-01-02 | Discharge: 2020-01-02 | Disposition: A | Discharge status: Home / Self Care | Payer: BC Managed Care – PPO | Source: Ambulatory Visit | Attending: Primary Care | Admitting: Primary Care

## 2020-01-02 DIAGNOSIS — U071 COVID-19: Secondary | ICD-10-CM | POA: Diagnosis not present

## 2020-01-02 DIAGNOSIS — J90 Pleural effusion, not elsewhere classified: Secondary | ICD-10-CM | POA: Diagnosis not present

## 2020-01-02 DIAGNOSIS — K861 Other chronic pancreatitis: Secondary | ICD-10-CM | POA: Diagnosis not present

## 2020-01-02 DIAGNOSIS — E876 Hypokalemia: Secondary | ICD-10-CM | POA: Diagnosis not present

## 2020-01-02 DIAGNOSIS — J45909 Unspecified asthma, uncomplicated: Secondary | ICD-10-CM | POA: Diagnosis not present

## 2020-01-02 DIAGNOSIS — K219 Gastro-esophageal reflux disease without esophagitis: Secondary | ICD-10-CM | POA: Diagnosis not present

## 2020-01-02 DIAGNOSIS — E221 Hyperprolactinemia: Secondary | ICD-10-CM | POA: Diagnosis not present

## 2020-01-02 DIAGNOSIS — R918 Other nonspecific abnormal finding of lung field: Secondary | ICD-10-CM | POA: Diagnosis not present

## 2020-01-02 DIAGNOSIS — J189 Pneumonia, unspecified organism: Secondary | ICD-10-CM | POA: Diagnosis not present

## 2020-01-02 DIAGNOSIS — R000 Tachycardia, unspecified: Secondary | ICD-10-CM | POA: Diagnosis not present

## 2020-01-02 DIAGNOSIS — Z9981 Dependence on supplemental oxygen: Secondary | ICD-10-CM | POA: Diagnosis not present

## 2020-01-02 DIAGNOSIS — M799 Soft tissue disorder, unspecified: Secondary | ICD-10-CM | POA: Diagnosis not present

## 2020-01-02 DIAGNOSIS — J1282 Pneumonia due to coronavirus disease 2019: Secondary | ICD-10-CM | POA: Insufficient documentation

## 2020-01-02 DIAGNOSIS — Z7901 Long term (current) use of anticoagulants: Secondary | ICD-10-CM | POA: Diagnosis not present

## 2020-01-02 DIAGNOSIS — E785 Hyperlipidemia, unspecified: Secondary | ICD-10-CM | POA: Diagnosis not present

## 2020-01-02 DIAGNOSIS — J9601 Acute respiratory failure with hypoxia: Secondary | ICD-10-CM | POA: Diagnosis not present

## 2020-01-02 DIAGNOSIS — Z7952 Long term (current) use of systemic steroids: Secondary | ICD-10-CM | POA: Diagnosis not present

## 2020-01-02 NOTE — Telephone Encounter (Signed)
Patient came in to clinic for blood work and wanted to know if PCP can send in any medication for cough. Patient states she been having her cough since she was covid-19 positive on June 11.   Please advice [phone]

## 2020-01-02 NOTE — Telephone Encounter (Signed)
PLEASE follow up with [facility]. See prior note> Copied from [place] [id number]. Topic: General - Other >> Jan 02, 2020  9:48 AM Rainey Pines A wrote: [facility] called to get Verbal orders for nursing and also needs to know  how much oxygen patient is suppose to be on  and wants to know if patient needs to be on zinc, Patient stated that she was advised at hospital to start zinc but it wasn't on here discharge papers.. Please advise best contact [phone]

## 2020-01-02 NOTE — Telephone Encounter (Signed)
Will send in cough medication

## 2020-01-03 LAB — CMP14+EGFR
ALT: 30 IU/L (ref 0–32)
AST: 15 IU/L (ref 0–40)
Albumin/Globulin Ratio: 1.5 (ref 1.2–2.2)
Albumin: 3.6 g/dL — ABNORMAL LOW (ref 3.8–4.8)
Alkaline Phosphatase: 94 IU/L (ref 48–121)
BUN/Creatinine Ratio: 11 (ref 9–23)
BUN: 9 mg/dL (ref 6–24)
Bilirubin Total: 0.2 mg/dL (ref 0.0–1.2)
CO2: 25 mmol/L (ref 20–29)
Calcium: 9.1 mg/dL (ref 8.7–10.2)
Chloride: 102 mmol/L (ref 96–106)
Creatinine, Ser: 0.84 mg/dL (ref 0.57–1.00)
GFR calc Af Amer: 98 mL/min/{1.73_m2} (ref >59)
GFR calc non Af Amer: 85 mL/min/{1.73_m2} (ref >59)
Globulin, Total: 2.4 g/dL (ref 1.5–4.5)
Glucose: 115 mg/dL — ABNORMAL HIGH (ref 65–99)
Potassium: 4 mmol/L (ref 3.5–5.2)
Sodium: 140 mmol/L (ref 134–144)
Total Protein: 6 g/dL (ref 6.0–8.5)

## 2020-01-03 LAB — CBC WITH DIFFERENTIAL/PLATELET
Basophils Absolute: 0 10*3/uL (ref 0.0–0.2)
Basos: 0 %
EOS (ABSOLUTE): 0.1 10*3/uL (ref 0.0–0.4)
Eos: 2 %
Hematocrit: 37.4 % (ref 34.0–46.6)
Hemoglobin: 12.2 g/dL (ref 11.1–15.9)
Immature Grans (Abs): 0 10*3/uL (ref 0.0–0.1)
Immature Granulocytes: 1 %
Lymphocytes Absolute: 2.2 10*3/uL (ref 0.7–3.1)
Lymphs: 29 %
MCH: 28.1 pg (ref 26.6–33.0)
MCHC: 32.6 g/dL (ref 31.5–35.7)
MCV: 86 fL (ref 79–97)
Monocytes Absolute: 0.7 10*3/uL (ref 0.1–0.9)
Monocytes: 9 %
Neutrophils Absolute: 4.3 10*3/uL (ref 1.4–7.0)
Neutrophils: 59 %
Platelets: 228 10*3/uL (ref 150–450)
RBC: 4.34 x10E6/uL (ref 3.77–5.28)
RDW: 15.3 % (ref 11.7–15.4)
WBC: 7.4 10*3/uL (ref 3.4–10.8)

## 2020-01-05 DIAGNOSIS — R918 Other nonspecific abnormal finding of lung field: Secondary | ICD-10-CM | POA: Diagnosis not present

## 2020-01-05 DIAGNOSIS — R000 Tachycardia, unspecified: Secondary | ICD-10-CM | POA: Diagnosis not present

## 2020-01-05 DIAGNOSIS — J9601 Acute respiratory failure with hypoxia: Secondary | ICD-10-CM | POA: Diagnosis not present

## 2020-01-05 DIAGNOSIS — U071 COVID-19: Secondary | ICD-10-CM | POA: Diagnosis not present

## 2020-01-05 DIAGNOSIS — J45909 Unspecified asthma, uncomplicated: Secondary | ICD-10-CM | POA: Diagnosis not present

## 2020-01-05 DIAGNOSIS — Z9981 Dependence on supplemental oxygen: Secondary | ICD-10-CM | POA: Diagnosis not present

## 2020-01-05 DIAGNOSIS — Z7901 Long term (current) use of anticoagulants: Secondary | ICD-10-CM | POA: Diagnosis not present

## 2020-01-05 DIAGNOSIS — J1282 Pneumonia due to coronavirus disease 2019: Secondary | ICD-10-CM | POA: Diagnosis not present

## 2020-01-05 DIAGNOSIS — K219 Gastro-esophageal reflux disease without esophagitis: Secondary | ICD-10-CM | POA: Diagnosis not present

## 2020-01-05 DIAGNOSIS — E221 Hyperprolactinemia: Secondary | ICD-10-CM | POA: Diagnosis not present

## 2020-01-05 DIAGNOSIS — M799 Soft tissue disorder, unspecified: Secondary | ICD-10-CM | POA: Diagnosis not present

## 2020-01-05 DIAGNOSIS — J90 Pleural effusion, not elsewhere classified: Secondary | ICD-10-CM | POA: Diagnosis not present

## 2020-01-05 DIAGNOSIS — K861 Other chronic pancreatitis: Secondary | ICD-10-CM | POA: Diagnosis not present

## 2020-01-05 DIAGNOSIS — E785 Hyperlipidemia, unspecified: Secondary | ICD-10-CM | POA: Diagnosis not present

## 2020-01-05 DIAGNOSIS — Z7952 Long term (current) use of systemic steroids: Secondary | ICD-10-CM | POA: Diagnosis not present

## 2020-01-05 DIAGNOSIS — E876 Hypokalemia: Secondary | ICD-10-CM | POA: Diagnosis not present

## 2020-01-05 NOTE — Telephone Encounter (Signed)
Beth called from [facility] needing status update and questions answered, please advise  Best contact: [phone]

## 2020-01-05 NOTE — Telephone Encounter (Signed)
Home Health Verbal Orders - Caller/Agency: [facility] Number: [phone] Requesting OT/PT/Skilled Nursing/Social Work/Speech Therapy: Need verbals for Nursing  Frequency:  2 week 3  1 week 1 1 week 4

## 2020-01-05 NOTE — Telephone Encounter (Signed)
Returned call to [employer] and gave verbals. She stated that patient is receiving in home therapy. Does not need referral for outpatient PT. Just an FYI.

## 2020-01-05 NOTE — Telephone Encounter (Signed)
Left message for the nurse on cell phone per her request -discharge did not include Zinc and she is supposed to be on 2 liters of oxygen per d/c note

## 2020-01-06 ENCOUNTER — Other Ambulatory Visit (INDEPENDENT_AMBULATORY_CARE_PROVIDER_SITE_OTHER): Payer: Self-pay | Admitting: Primary Care

## 2020-01-06 ENCOUNTER — Encounter (INDEPENDENT_AMBULATORY_CARE_PROVIDER_SITE_OTHER): Payer: Self-pay | Admitting: Primary Care

## 2020-01-06 MED ORDER — HYDROCOD POLST-CPM POLST ER 10-8 MG/5ML PO SUER: 5.0000 mL | Freq: Two times a day (BID) | ORAL | 0 refills | Status: DC | PRN

## 2020-01-06 NOTE — Telephone Encounter (Signed)
Please send in cough medication.

## 2020-01-07 ENCOUNTER — Telehealth: Payer: Self-pay | Admitting: Primary Care

## 2020-01-07 NOTE — Telephone Encounter (Signed)
Please f/u   Copied from [place] [phone]. Topic: Medical Record Request - Other >> Jan 07, 2020 11:07 AM Lennox Solders wrote: Pt is calling and said she signed medical release form on 12-26-2019 to have [facility] to fax her records to [employer]. Pt call them and they have not received the medical request form to release her records. Pt is checking on the status. Pt saw michelle on 01/01/2020. Their office phone number is [phone]

## 2020-01-07 NOTE — Telephone Encounter (Signed)
Patient is aware that medical record request was sent on the 2nd of July and was advice that the request would be faxed again today.

## 2020-01-08 DIAGNOSIS — E785 Hyperlipidemia, unspecified: Secondary | ICD-10-CM | POA: Diagnosis not present

## 2020-01-08 DIAGNOSIS — E876 Hypokalemia: Secondary | ICD-10-CM | POA: Diagnosis not present

## 2020-01-08 DIAGNOSIS — J1282 Pneumonia due to coronavirus disease 2019: Secondary | ICD-10-CM | POA: Diagnosis not present

## 2020-01-08 DIAGNOSIS — M799 Soft tissue disorder, unspecified: Secondary | ICD-10-CM | POA: Diagnosis not present

## 2020-01-08 DIAGNOSIS — J90 Pleural effusion, not elsewhere classified: Secondary | ICD-10-CM | POA: Diagnosis not present

## 2020-01-08 DIAGNOSIS — U071 COVID-19: Secondary | ICD-10-CM | POA: Diagnosis not present

## 2020-01-08 DIAGNOSIS — K219 Gastro-esophageal reflux disease without esophagitis: Secondary | ICD-10-CM | POA: Diagnosis not present

## 2020-01-08 DIAGNOSIS — K861 Other chronic pancreatitis: Secondary | ICD-10-CM | POA: Diagnosis not present

## 2020-01-08 DIAGNOSIS — E221 Hyperprolactinemia: Secondary | ICD-10-CM | POA: Diagnosis not present

## 2020-01-08 DIAGNOSIS — Z9981 Dependence on supplemental oxygen: Secondary | ICD-10-CM | POA: Diagnosis not present

## 2020-01-08 DIAGNOSIS — R918 Other nonspecific abnormal finding of lung field: Secondary | ICD-10-CM | POA: Diagnosis not present

## 2020-01-08 DIAGNOSIS — R000 Tachycardia, unspecified: Secondary | ICD-10-CM | POA: Diagnosis not present

## 2020-01-08 DIAGNOSIS — Z7952 Long term (current) use of systemic steroids: Secondary | ICD-10-CM | POA: Diagnosis not present

## 2020-01-08 DIAGNOSIS — J9601 Acute respiratory failure with hypoxia: Secondary | ICD-10-CM | POA: Diagnosis not present

## 2020-01-08 DIAGNOSIS — Z7901 Long term (current) use of anticoagulants: Secondary | ICD-10-CM | POA: Diagnosis not present

## 2020-01-08 DIAGNOSIS — J45909 Unspecified asthma, uncomplicated: Secondary | ICD-10-CM | POA: Diagnosis not present

## 2020-01-09 ENCOUNTER — Encounter (INDEPENDENT_AMBULATORY_CARE_PROVIDER_SITE_OTHER): Payer: Self-pay | Admitting: Primary Care

## 2020-01-10 DIAGNOSIS — E785 Hyperlipidemia, unspecified: Secondary | ICD-10-CM | POA: Diagnosis not present

## 2020-01-10 DIAGNOSIS — Z9981 Dependence on supplemental oxygen: Secondary | ICD-10-CM | POA: Diagnosis not present

## 2020-01-10 DIAGNOSIS — Z7901 Long term (current) use of anticoagulants: Secondary | ICD-10-CM | POA: Diagnosis not present

## 2020-01-10 DIAGNOSIS — E876 Hypokalemia: Secondary | ICD-10-CM | POA: Diagnosis not present

## 2020-01-10 DIAGNOSIS — J90 Pleural effusion, not elsewhere classified: Secondary | ICD-10-CM | POA: Diagnosis not present

## 2020-01-10 DIAGNOSIS — J1282 Pneumonia due to coronavirus disease 2019: Secondary | ICD-10-CM | POA: Diagnosis not present

## 2020-01-10 DIAGNOSIS — Z7952 Long term (current) use of systemic steroids: Secondary | ICD-10-CM | POA: Diagnosis not present

## 2020-01-10 DIAGNOSIS — E221 Hyperprolactinemia: Secondary | ICD-10-CM | POA: Diagnosis not present

## 2020-01-10 DIAGNOSIS — R918 Other nonspecific abnormal finding of lung field: Secondary | ICD-10-CM | POA: Diagnosis not present

## 2020-01-10 DIAGNOSIS — K219 Gastro-esophageal reflux disease without esophagitis: Secondary | ICD-10-CM | POA: Diagnosis not present

## 2020-01-10 DIAGNOSIS — J9601 Acute respiratory failure with hypoxia: Secondary | ICD-10-CM | POA: Diagnosis not present

## 2020-01-10 DIAGNOSIS — U071 COVID-19: Secondary | ICD-10-CM | POA: Diagnosis not present

## 2020-01-10 DIAGNOSIS — J45909 Unspecified asthma, uncomplicated: Secondary | ICD-10-CM | POA: Diagnosis not present

## 2020-01-10 DIAGNOSIS — K861 Other chronic pancreatitis: Secondary | ICD-10-CM | POA: Diagnosis not present

## 2020-01-10 DIAGNOSIS — M799 Soft tissue disorder, unspecified: Secondary | ICD-10-CM | POA: Diagnosis not present

## 2020-01-10 DIAGNOSIS — R000 Tachycardia, unspecified: Secondary | ICD-10-CM | POA: Diagnosis not present

## 2020-01-12 ENCOUNTER — Telehealth (INDEPENDENT_AMBULATORY_CARE_PROVIDER_SITE_OTHER): Payer: Self-pay | Admitting: Primary Care

## 2020-01-12 DIAGNOSIS — Z7901 Long term (current) use of anticoagulants: Secondary | ICD-10-CM | POA: Diagnosis not present

## 2020-01-12 DIAGNOSIS — Z9981 Dependence on supplemental oxygen: Secondary | ICD-10-CM | POA: Diagnosis not present

## 2020-01-12 DIAGNOSIS — R000 Tachycardia, unspecified: Secondary | ICD-10-CM | POA: Diagnosis not present

## 2020-01-12 DIAGNOSIS — J9601 Acute respiratory failure with hypoxia: Secondary | ICD-10-CM | POA: Diagnosis not present

## 2020-01-12 DIAGNOSIS — E876 Hypokalemia: Secondary | ICD-10-CM | POA: Diagnosis not present

## 2020-01-12 DIAGNOSIS — J1282 Pneumonia due to coronavirus disease 2019: Secondary | ICD-10-CM | POA: Diagnosis not present

## 2020-01-12 DIAGNOSIS — U071 COVID-19: Secondary | ICD-10-CM | POA: Diagnosis not present

## 2020-01-12 DIAGNOSIS — J90 Pleural effusion, not elsewhere classified: Secondary | ICD-10-CM | POA: Diagnosis not present

## 2020-01-12 DIAGNOSIS — Z7952 Long term (current) use of systemic steroids: Secondary | ICD-10-CM | POA: Diagnosis not present

## 2020-01-12 DIAGNOSIS — K861 Other chronic pancreatitis: Secondary | ICD-10-CM | POA: Diagnosis not present

## 2020-01-12 DIAGNOSIS — J45909 Unspecified asthma, uncomplicated: Secondary | ICD-10-CM | POA: Diagnosis not present

## 2020-01-12 DIAGNOSIS — R918 Other nonspecific abnormal finding of lung field: Secondary | ICD-10-CM | POA: Diagnosis not present

## 2020-01-12 DIAGNOSIS — E221 Hyperprolactinemia: Secondary | ICD-10-CM | POA: Diagnosis not present

## 2020-01-12 DIAGNOSIS — K219 Gastro-esophageal reflux disease without esophagitis: Secondary | ICD-10-CM | POA: Diagnosis not present

## 2020-01-12 DIAGNOSIS — E785 Hyperlipidemia, unspecified: Secondary | ICD-10-CM | POA: Diagnosis not present

## 2020-01-12 DIAGNOSIS — M799 Soft tissue disorder, unspecified: Secondary | ICD-10-CM | POA: Diagnosis not present

## 2020-01-12 NOTE — Telephone Encounter (Signed)
Fritz Pickerel from [place] is calling to ask how much the patient will have for a Rolator. [employer] provided information on how much the co insurance would be. Fritz Pickerel is not sure how the office would file that? Please advise CB- [phone]  Or contact the patient -[phone]

## 2020-01-13 ENCOUNTER — Other Ambulatory Visit: Payer: Self-pay

## 2020-01-13 ENCOUNTER — Ambulatory Visit (INDEPENDENT_AMBULATORY_CARE_PROVIDER_SITE_OTHER)
Admission: RE | Admit: 2020-01-13 | Discharge: 2020-01-13 | Disposition: A | Discharge status: Home / Self Care | Payer: BC Managed Care – PPO | Source: Ambulatory Visit | Attending: Internal Medicine | Admitting: Internal Medicine

## 2020-01-13 DIAGNOSIS — Z9049 Acquired absence of other specified parts of digestive tract: Secondary | ICD-10-CM | POA: Diagnosis not present

## 2020-01-13 DIAGNOSIS — R590 Localized enlarged lymph nodes: Secondary | ICD-10-CM | POA: Diagnosis not present

## 2020-01-13 DIAGNOSIS — J181 Lobar pneumonia, unspecified organism: Secondary | ICD-10-CM | POA: Diagnosis not present

## 2020-01-13 DIAGNOSIS — R591 Generalized enlarged lymph nodes: Secondary | ICD-10-CM | POA: Diagnosis not present

## 2020-01-13 DIAGNOSIS — R599 Enlarged lymph nodes, unspecified: Secondary | ICD-10-CM

## 2020-01-13 MED ORDER — IOHEXOL 300 MG/ML  SOLN
80.0000 mL | Freq: Once | INTRAMUSCULAR | Status: AC | PRN
  Administered 2020-01-13: 80 mL via INTRAVENOUS

## 2020-01-13 NOTE — Telephone Encounter (Signed)
resolved 

## 2020-01-13 NOTE — Telephone Encounter (Signed)
Please address and follow up with patient.   Thank you Whitney Post

## 2020-01-14 ENCOUNTER — Ambulatory Visit: Payer: BC Managed Care – PPO | Admitting: Internal Medicine

## 2020-01-14 ENCOUNTER — Telehealth: Payer: BC Managed Care – PPO | Admitting: Physician Assistant

## 2020-01-14 DIAGNOSIS — U071 COVID-19: Secondary | ICD-10-CM | POA: Diagnosis not present

## 2020-01-14 DIAGNOSIS — K219 Gastro-esophageal reflux disease without esophagitis: Secondary | ICD-10-CM | POA: Diagnosis not present

## 2020-01-14 DIAGNOSIS — R918 Other nonspecific abnormal finding of lung field: Secondary | ICD-10-CM | POA: Diagnosis not present

## 2020-01-14 DIAGNOSIS — J45909 Unspecified asthma, uncomplicated: Secondary | ICD-10-CM | POA: Diagnosis not present

## 2020-01-14 DIAGNOSIS — J90 Pleural effusion, not elsewhere classified: Secondary | ICD-10-CM | POA: Diagnosis not present

## 2020-01-14 DIAGNOSIS — K861 Other chronic pancreatitis: Secondary | ICD-10-CM | POA: Diagnosis not present

## 2020-01-14 DIAGNOSIS — E876 Hypokalemia: Secondary | ICD-10-CM | POA: Diagnosis not present

## 2020-01-14 DIAGNOSIS — E221 Hyperprolactinemia: Secondary | ICD-10-CM | POA: Diagnosis not present

## 2020-01-14 DIAGNOSIS — E785 Hyperlipidemia, unspecified: Secondary | ICD-10-CM | POA: Diagnosis not present

## 2020-01-14 DIAGNOSIS — Z7952 Long term (current) use of systemic steroids: Secondary | ICD-10-CM | POA: Diagnosis not present

## 2020-01-14 DIAGNOSIS — Z9981 Dependence on supplemental oxygen: Secondary | ICD-10-CM | POA: Diagnosis not present

## 2020-01-14 DIAGNOSIS — R000 Tachycardia, unspecified: Secondary | ICD-10-CM | POA: Diagnosis not present

## 2020-01-14 DIAGNOSIS — M799 Soft tissue disorder, unspecified: Secondary | ICD-10-CM | POA: Diagnosis not present

## 2020-01-14 DIAGNOSIS — J9601 Acute respiratory failure with hypoxia: Secondary | ICD-10-CM | POA: Diagnosis not present

## 2020-01-14 DIAGNOSIS — J1282 Pneumonia due to coronavirus disease 2019: Secondary | ICD-10-CM | POA: Diagnosis not present

## 2020-01-14 DIAGNOSIS — Z7901 Long term (current) use of anticoagulants: Secondary | ICD-10-CM | POA: Diagnosis not present

## 2020-01-15 ENCOUNTER — Other Ambulatory Visit (INDEPENDENT_AMBULATORY_CARE_PROVIDER_SITE_OTHER): Payer: Self-pay | Admitting: Primary Care

## 2020-01-15 ENCOUNTER — Telehealth: Payer: Self-pay | Admitting: Primary Care

## 2020-01-15 DIAGNOSIS — U071 COVID-19: Secondary | ICD-10-CM | POA: Diagnosis not present

## 2020-01-15 DIAGNOSIS — E876 Hypokalemia: Secondary | ICD-10-CM | POA: Diagnosis not present

## 2020-01-15 DIAGNOSIS — M799 Soft tissue disorder, unspecified: Secondary | ICD-10-CM | POA: Diagnosis not present

## 2020-01-15 DIAGNOSIS — R918 Other nonspecific abnormal finding of lung field: Secondary | ICD-10-CM | POA: Diagnosis not present

## 2020-01-15 DIAGNOSIS — J1282 Pneumonia due to coronavirus disease 2019: Secondary | ICD-10-CM

## 2020-01-15 DIAGNOSIS — Z9981 Dependence on supplemental oxygen: Secondary | ICD-10-CM | POA: Diagnosis not present

## 2020-01-15 DIAGNOSIS — J90 Pleural effusion, not elsewhere classified: Secondary | ICD-10-CM | POA: Diagnosis not present

## 2020-01-15 DIAGNOSIS — K219 Gastro-esophageal reflux disease without esophagitis: Secondary | ICD-10-CM | POA: Diagnosis not present

## 2020-01-15 DIAGNOSIS — Z7952 Long term (current) use of systemic steroids: Secondary | ICD-10-CM | POA: Diagnosis not present

## 2020-01-15 DIAGNOSIS — J9601 Acute respiratory failure with hypoxia: Secondary | ICD-10-CM | POA: Diagnosis not present

## 2020-01-15 DIAGNOSIS — J45909 Unspecified asthma, uncomplicated: Secondary | ICD-10-CM | POA: Diagnosis not present

## 2020-01-15 DIAGNOSIS — Z7901 Long term (current) use of anticoagulants: Secondary | ICD-10-CM | POA: Diagnosis not present

## 2020-01-15 DIAGNOSIS — K861 Other chronic pancreatitis: Secondary | ICD-10-CM | POA: Diagnosis not present

## 2020-01-15 DIAGNOSIS — R000 Tachycardia, unspecified: Secondary | ICD-10-CM | POA: Diagnosis not present

## 2020-01-15 DIAGNOSIS — E221 Hyperprolactinemia: Secondary | ICD-10-CM | POA: Diagnosis not present

## 2020-01-15 DIAGNOSIS — E785 Hyperlipidemia, unspecified: Secondary | ICD-10-CM | POA: Diagnosis not present

## 2020-01-15 NOTE — Telephone Encounter (Signed)
Copied from [place] [phone]. Topic: General - Other >> Jan 15, 2020  3:14 PM Oneta Rack wrote: Osvaldo Human name: Yetta Glassman  Relation to pt: PT from [facility]  Call back number: [phone]   Reason for call:  PT wanted to inform PCP of the following BP readings prior to exertion today 140/90 yesterday 146/86 and last week 120/80. Also patient would benefift from having a handicap place card

## 2020-01-15 NOTE — Telephone Encounter (Signed)
Copied from [place] [phone]. Topic: General - Other >> Jan 15, 2020  2:38 PM Leward Quan A wrote: Reason for CRM: Beth nurse with [facility] called to inform [name] that patient is still getting tachycardic her heart rate at rest is 80 and upon walking to the restroom she can go up to 120 and show some SOB. Any questions Eustaquio Maize can be reached at Ph# [phone]

## 2020-01-18 DIAGNOSIS — J9601 Acute respiratory failure with hypoxia: Secondary | ICD-10-CM | POA: Diagnosis not present

## 2020-01-18 DIAGNOSIS — U071 COVID-19: Secondary | ICD-10-CM | POA: Diagnosis not present

## 2020-01-19 ENCOUNTER — Other Ambulatory Visit: Payer: Self-pay

## 2020-01-19 ENCOUNTER — Ambulatory Visit: Payer: BC Managed Care – PPO | Admitting: Pulmonary Disease

## 2020-01-19 ENCOUNTER — Encounter: Payer: Self-pay | Admitting: Pulmonary Disease

## 2020-01-19 VITALS — BP 122/76 | HR 88 | Temp 98.6°F | Ht 69.0 in | Wt 261.8 lb

## 2020-01-19 DIAGNOSIS — J1282 Pneumonia due to coronavirus disease 2019: Secondary | ICD-10-CM | POA: Diagnosis not present

## 2020-01-19 DIAGNOSIS — J9601 Acute respiratory failure with hypoxia: Secondary | ICD-10-CM

## 2020-01-19 DIAGNOSIS — U071 COVID-19: Secondary | ICD-10-CM | POA: Diagnosis not present

## 2020-01-19 MED ORDER — BENZONATATE 200 MG PO CAPS: 200.0000 mg | ORAL_CAPSULE | Freq: Three times a day (TID) | ORAL | 3 refills | Status: DC | PRN

## 2020-01-19 NOTE — Patient Instructions (Addendum)
You were seen today by Lauraine Rinne, NP  for:   1. Pneumonia due to COVID-19 virus  - Pulmonary function test; Future  Continue home health  Continue physical therapy  Continue to increase your overall physical activity  We will see back next month to establish care with Dr. Lamonte Sakai  I have referred you to a new primary care provider at your request  2. Acute respiratory failure with hypoxia (HCC)  Walk today in office  We will order a breathing test to further evaluate your lung functioning status post COVID-19   Continue oxygen therapy as prescribed  >>>maintain oxygen saturations greater than 88 percent  >>>if unable to maintain oxygen saturations please contact the office  >>>do not smoke with oxygen  >>>can use nasal saline gel or nasal saline rinses to moisturize nose if oxygen causes dryness   We recommend today:  Orders Placed This Encounter  Procedures  . Pulmonary function test    Standing Status:   Future    Standing Expiration Date:   01/18/2021    Order Specific Question:   Where should this test be performed?    Answer:   [facility]    Order Specific Question:   Full PFT: includes the following: basic spirometry, spirometry pre & post bronchodilator, diffusion capacity (DLCO), lung volumes    Answer:   Full PFT   Orders Placed This Encounter  Procedures  . Pulmonary function test   No orders of the defined types were placed in this encounter.   Follow Up:    Return in about 4 weeks (around 02/16/2020), or if symptoms worsen or fail to improve, for Follow up with Dr. Lamonte Sakai, Follow up for FULL PFT - 60 min.  NEXT AVAILABLE PFT         Please do your part to reduce the spread of COVID-19:      Reduce your risk of any infection  and COVID19 by using the similar precautions used for avoiding the common cold or flu:  Marland Kitchen Wash your hands often with soap and warm water for at least 20 seconds.  If soap and water are not readily available, use  an alcohol-based hand sanitizer with at least 60% alcohol.  . If coughing or sneezing, cover your mouth and nose by coughing or sneezing into the elbow areas of your shirt or coat, into a tissue or into your sleeve (not your hands). Langley Gauss A MASK when in public  . Avoid shaking hands with others and consider head nods or verbal greetings only. . Avoid touching your eyes, nose, or mouth with unwashed hands.  . Avoid close contact with people who are sick. . Avoid places or events with large numbers of people in one location, like concerts or sporting events. . If you have some symptoms but not all symptoms, continue to monitor at home and seek medical attention if your symptoms worsen. . If you are having a medical emergency, call 911.   [place] / e-Visit: [email or web address]         [facility]: [facility]: [phone]                   [facility]: [phone]     It is flu season:   >>> Best ways to protect herself from the flu: Receive the yearly flu vaccine, practice good hand hygiene washing with soap and also using  hand sanitizer when available, eat a nutritious meals, get adequate rest, hydrate appropriately   Please contact the office if your symptoms worsen or you have concerns that you are not improving.   Thank you for choosing  [facility] for your healthcare, and for allowing [place] to partner with you on your healthcare journey. I am thankful to be able to provide care to you today.   Wyn Quaker FNP-C

## 2020-01-19 NOTE — Assessment & Plan Note (Signed)
Required oxygen at discharge from the hospital Walk today in office stable with no oxygen desaturations  Plan: Obtain pulmonary function testing We will need to obtain chest imaging in 3 to 6 months may be sooner based off a pulmonary function test We will benefit from walk at next office and could consider stopping exertional oxygen at that time

## 2020-01-19 NOTE — Assessment & Plan Note (Signed)
History of COVID-19 requiring hospitalization in July/2021 Reticulation seen on CT imaging Discharged on oxygen Walk today in office patient was able to complete 1 lap without any oxygen desaturations on room air Visibly weak and fatigued Needs outpatient pulmonary function testing  Plan: Pulmonary function testing ordered today Patient to establish care with Dr. Lamonte Sakai next month We will plan on repeating imaging of chest in 3 to 6 months or sooner based off of pulmonary function testing Continue home physical therapy Would benefit from walk in office at next visit as well

## 2020-01-19 NOTE — Telephone Encounter (Signed)
When should patient return for a bp follow up.  Please advice   thank you   Beth [name] v.

## 2020-01-19 NOTE — Progress Notes (Signed)
_0  ID: Beth [name], female    DOB: Jun 04, 1976, 44 y.o.   MRN: [id number]  Chief Complaint  Patient presents with  . Follow-up    shortness of breath with exertion    Referring provider: No ref. provider found  HPI:  44 year old female never smoker initially consulted with our practice while inpatient for COVID-19 pneumonia in July/2021  PMH: Hypokalemia, acute respiratory failure Smoker/ Smoking History: Never smoker Maintenance: None Pt of: Needs outpatient pulmonologist  01/19/2020  - Visit   44 year old female never smoker initially consulted with our practice when hospitalized back in June for COVID-19.  Patient was discharged on 12/19/2019.  Consulted with our practice on 12/18/2019.  Discharge summary excerpt listed below:  1.Acute Hypoxic Resp. Failure due to Acute Covid 19 Viral Pneumonitis during the ongoing 2020 Covid 19 Pandemic - she had moderate to severe disease and was treated with IV steroids and remdesivir course, will finish her remdesivir course and subsequently will be placed on oral steroid taper, also due to inflammation elevated D-dimer with very high risk of developing DVT hence 2 weeks of prophylactic Eliquis upon discharge.  CTA and leg ultrasound did not show any acute clots thankfully so far.  She is much better and symptom-free at rest, on rest she is on room air upon ambulation she does desat and will be given 2 L of nasal cannula oxygen as needed.  She will follow with PCP and pulmonary outpatient in 1 to 2 weeks.   SpO2: 95 % O2 Flow Rate (L/min): 2 L/min  Last Labs            Recent Labs  Lab 12/15/19 1222 12/15/19 1305 12/15/19 2037 12/16/19 0325 12/17/19 0213 12/18/19 0309 12/19/19 0349  CRP  --   --  32.3* 33.4* 22.5* 13.0* 8.2*  DDIMER 2.05*  --   --  2.70* 2.32* 2.10* 2.30*  FERRITIN  --   --  1,360* 1,272*  --   --   --   BNP  --   --  22.2 13.5 39.3 50.2 84.1  PROCALCITON  --   --  0.14 0.12 <0.10 <0.10 <0.10    SARSCOV2NAA  --  POSITIVE*  --   --   --   --   --       Hepatic Function Latest Ref Rng & Units 12/19/2019 12/18/2019 12/17/2019  Total Protein 6.5 - 8.1 g/dL 5.7(L) 5.9(L) 6.3(L)  Albumin 3.5 - 5.0 g/dL 2.3(L) 2.3(L) 2.4(L)  AST 15 - 41 U/L _1 ALT 0 - 44 U/L _2 Alk Phosphatase 38 - 126 U/L 60 64 68  Total Bilirubin 0.3 - 1.2 mg/dL 0.5 0.5 0.5    2. Heterogeneous right-sided paratracheal soft tissue mass which is worrisome for the presence of an underlying neoplasm - incidentally on CT scan chest, seen by pulmonary critical care plan is to follow with them outpatient for possible repeat CT scan and bronchoscopy directed biopsy.  3. Pancreatitis. Stable no acute issues. Continue chronic Creon supplementation.  4. Dyslipidemia. Continue statin.  5. GERD. PPI.  6.Moderately increased D-dimer due to inflammation from Covid, very high risk for developing a blood clot in the setting of inflammation.  Was on high-dose Lovenox here, CTA and leg ultrasound unremarkable.  2 weeks of Eliquis upon discharge at prophylactic dose as NSAIDs of developing a clot are very high in the light of severe Covid induced inflammation.  Patient was evaluated by Dr. Tamala Julian on 12/18/2019.  It was felt on that time that she should remain on [facility].  Patient needs to establish with a primary care provider as well as have follow-up with our office.  Patient will also likely need pulmonary function testing.  We will review and discuss this today.  Patient reports today that she has been working with home health as well as physical therapy.  She has been able to slowly increase her overall physical activity.  She is still very weak.  At moment she is having increased heart rate per her home health nurse.  Walk today in office patient does not have any oxygen desaturations on room air was only able to complete 1 lap due to physical exhaustion.  Patient continues to have cough with clear  sputum.  Her cough happens more with inspiration.  She is wondering what she can do to help with management of the cough.    Questionaires / Pulmonary Flowsheets:   ACT:  No flowsheet data found.  MMRC: mMRC Dyspnea Scale mMRC Score  01/19/2020 3    [place]:  No flowsheet data found.  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  WALK:  SIX MIN WALK 01/19/2020  Supplimental Oxygen during Test? (L/min) No  Tech Comments: Slow paced walk x1 lap with one rest period due to fatigue    Imaging: DG Chest 2 View  Result Date: 01/02/2020 CLINICAL DATA:  COVID-19 positive 12/05/2019, pneumonia EXAM: CHEST - 2 VIEW COMPARISON:  12/15/2019 FINDINGS: Frontal and lateral views of the chest demonstrate persistent but improving patchy peripheral basilar predominant ground-glass opacities. No effusion or pneumothorax. Cardiac silhouette is stable. No acute bony abnormalities. IMPRESSION: 1. Persistent basilar predominant multifocal pneumonia, with significant improvement since prior study. Electronically Signed   By: Randa Ngo M.D.   On: 01/02/2020 23:16   CT Chest W Contrast  Result Date: 01/13/2020 CLINICAL DATA:  Mediastinal adenopathy EXAM: CT CHEST WITH CONTRAST TECHNIQUE: Multidetector CT imaging of the chest was performed during intravenous contrast administration. 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 Cardiac size within normal limits. No significant coronary artery calcification. No pericardial effusion. Bovine arch anatomy. Thoracic aorta is otherwise unremarkable. Central pulmonary arteries are of normal caliber. Mediastinum/Nodes: Visualized thyroid is unremarkable. No pathologic thoracic adenopathy. The esophagus is unremarkable. Lungs/Pleura: Previously noted extensive bibasilar and mid lung zone peripheral consolidation has improved significantly, the there is residual reticulation noted within the lung bases bilaterally in keeping with evolving changes of COVID-19 pneumonia. No new confluent pulmonary infiltrates. No pneumothorax or pleural effusion. The central airways are widely patent. Upper Abdomen: Surgical changes of cholecystectomy are identified. Musculoskeletal: No acute bone abnormality. 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 Involving changes of COVID-19 pneumonia with extensive bibasilar reticulation. Electronically Signed   By: AFidela SalisburyMD   On: 01/13/2020 23:44    Lab Results:  CBC    Component  Value Date/Time   WBC 7.4 01/02/2020 0955   WBC 14.4 (H) 12/19/2019 0349   RBC 4.34 01/02/2020 0955   RBC 3.80 (L) 12/19/2019 0349   HGB 12.2 01/02/2020 0955   HCT 37.4 01/02/2020 0955   PLT 228 01/02/2020 0955   MCV 86 01/02/2020 0955   MCH 28.1 01/02/2020 0955   MCH 28.2 12/19/2019 0349   MCHC 32.6 01/02/2020 0955   MCHC 33.5 12/19/2019 0349   RDW 15.3 01/02/2020 0955   LYMPHSABS 2.2 01/02/2020 0955   MONOABS 0.7 12/19/2019 0349   EOSABS 0.1 01/02/2020 0955   BASOSABS 0.0 01/02/2020 0955    BMET    Component Value Date/Time   NA 140 01/02/2020 0955   K 4.0 01/02/2020 0955   CL 102 01/02/2020 0955   CO2 25 01/02/2020 0955   GLUCOSE 115 (H) 01/02/2020 0955   GLUCOSE 224 (H) 12/19/2019 0349   BUN 9 01/02/2020 0955    CREATININE 0.84 01/02/2020 0955   CALCIUM 9.1 01/02/2020 0955   GFRNONAA 85 01/02/2020 0955   GFRAA 98 01/02/2020 0955    BNP    Component Value Date/Time   BNP 84.1 12/19/2019 0349    ProBNP No results found for: PROBNP  Specialty Problems      Pulmonary Problems   Acute respiratory failure with hypoxia (HCC)   Asthma without acute exacerbation   Pneumonia due to COVID-19 virus      Allergies  Allergen Reactions  . [place] Swelling  . [place] Grass Hives  . Cedar Swelling  . Cladosporium Cladosporioides Hives  . Dust Mite Extract Hives  . Elm Bark [Ulmus Fulva] Swelling  . Lidocaine Hcl Swelling  . Molds & Smuts Hives  . Mugwort Hives  . Nettle [Urtica Dioica] Hives  . Sorrel-Dock Mix [Sheep Sorrel-Yellow Dock] Hives  . Timothy Grass Pollen Allergen Hives  . Xylocaine [Lidocaine] Swelling     There is no immunization history on file for this patient.  Past Medical History:  Diagnosis Date  . Asthma   . Chronic pancreatitis ([place])   . Hypoglycemia   . Prolactinoma ([place])     Tobacco History: Social History   Tobacco Use  Smoking Status Never Smoker  Smokeless Tobacco Never Used   Counseling given: Not Answered   Continue to not smoke  Outpatient Encounter Medications as of 01/19/2020  Medication Sig  . albuterol (PROAIR HFA) 108 (90 Base) MCG/ACT inhaler Inhale 1 puff into the lungs every 4 (four) hours as needed for wheezing or shortness of breath.  Marland Kitchen atorvastatin (LIPITOR) 10 MG tablet Take 10 mg by mouth daily.   . bromocriptine (PARLODEL) 2.5 MG tablet Take 0.5 tablets (1.25 mg total) by mouth at bedtime.  . chlorpheniramine-HYDROcodone (TUSSIONEX PENNKINETIC ER) 10-8 MG/5ML SUER Take 5 mLs by mouth every 12 (twelve) hours as needed for cough.  Marland Kitchen ELIQUIS 2.5 MG TABS tablet TAKE 1 TABLET(2.5 MG) BY MOUTH TWICE DAILY  . EPINEPHrine (EPIPEN 2-PAK) 0.3 mg/0.3 mL IJ SOAJ injection Inject 0.3 mg into the muscle once.   . hydrochlorothiazide  (HYDRODIURIL) 12.5 MG tablet Take 12.5 mg by mouth daily.  Marland Kitchen levonorgestrel (MIRENA) 20 MCG/24HR IUD 1 each by Intrauterine route once.   . mometasone-formoterol (DULERA) 100-5 MCG/ACT AERO Inhale 2 puffs into the lungs daily.   . naftifine (NAFTIN) 1 % cream Apply 1 application topically daily as needed (fungs infection).   Marland Kitchen omega-3 fish oil (MAXEPA) 1000 MG CAPS capsule Take 2 capsules by mouth daily.   . Pancrelipase,  Lip-Prot-Amyl, (CREON) 24000-76000 units CPEP Take 2 capsules by mouth See admin instructions. Take 2 capsules three times daily with food and 1 capsule twice daily with snacks.  . pantoprazole (PROTONIX) 20 MG tablet Take 20 mg by mouth daily.  . benzonatate (TESSALON) 200 MG capsule Take 1 capsule (200 mg total) by mouth 3 (three) times daily as needed for cough.  . [DISCONTINUED] predniSONE (DELTASONE) 5 MG tablet Label  & dispense according to the schedule below. take 8 Pills PO for 3 days, 6 Pills PO for 3 days, 4 Pills PO for 3 days, 2 Pills PO for 3 days, 1 Pills PO for 3 days, 1/2 Pill  PO for 3 days then STOP. Total 65 pills. (Patient not taking: Reported on 01/19/2020)   No facility-administered encounter medications on file as of 01/19/2020.     Review of Systems  Review of Systems  Constitutional: Positive for fatigue. Negative for activity change and fever.  HENT: Positive for congestion. Negative for sinus pressure, sinus pain and sore throat.   Respiratory: Positive for cough (with clear mucous) and shortness of breath. Negative for wheezing.   Cardiovascular: Negative for chest pain and palpitations.  Gastrointestinal: Negative for diarrhea, nausea and vomiting.  Musculoskeletal: Negative for arthralgias.  Neurological: Negative for dizziness.  Psychiatric/Behavioral: Negative for sleep disturbance. The patient is not nervous/anxious.      Physical Exam  BP 122/76 (BP Location: Left Arm, Cuff Size: Normal)   Pulse 88   Temp 98.6 F (37 C) (Oral)   Ht  _0  (1.753 m)   Wt (!) 261 lb 12.8 oz (118.8 kg)   SpO2 100% Comment: 2L O2  BMI 38.66 kg/m   Wt Readings from Last 5 Encounters:  01/19/20 (!) 261 lb 12.8 oz (118.8 kg)  12/15/19 255 lb 8.2 oz (115.9 kg)  12/05/19 260 lb (117.9 kg)  04/03/19 257 lb 12.8 oz (116.9 kg)  02/15/18 264 lb 6.4 oz (119.9 kg)    BMI Readings from Last 5 Encounters:  01/19/20 38.66 kg/m  12/15/19 37.73 kg/m  12/05/19 38.40 kg/m  04/03/19 38.07 kg/m  02/15/18 39.05 kg/m     Physical Exam Vitals and nursing note reviewed.  Constitutional:      General: She is not in acute distress.    Appearance: Normal appearance. She is obese.  HENT:     Head: Normocephalic and atraumatic.     Right Ear: External ear normal.     Left Ear: External ear normal.     Nose: Nose normal. No congestion.     Mouth/Throat:     Mouth: Mucous membranes are moist.     Pharynx: Oropharynx is clear.  Eyes:     Pupils: Pupils are equal, round, and reactive to light.  Cardiovascular:     Rate and Rhythm: Normal rate and regular rhythm.     Pulses: Normal pulses.     Heart sounds: Normal heart sounds. No murmur heard.   Pulmonary:     Effort: Pulmonary effort is normal. No respiratory distress.     Breath sounds: No decreased air movement. Rales (? bibasilar crackles ) present. No decreased breath sounds or wheezing.  Musculoskeletal:     Cervical back: Normal range of motion.  Skin:    General: Skin is warm and dry.     Capillary Refill: Capillary refill takes less than 2 seconds.  Neurological:     General: No focal deficit present.     Mental Status: She is alert and  oriented to person, place, and time. Mental status is at baseline.     Gait: Gait normal.  Psychiatric:        Mood and Affect: Mood normal.        Behavior: Behavior normal.        Thought Content: Thought content normal.        Judgment: Judgment normal.       Assessment & Plan:   Pneumonia due to COVID-19 virus History of COVID-19  requiring hospitalization in July/2021 Reticulation seen on CT imaging Discharged on oxygen Walk today in office patient was able to complete 1 lap without any oxygen desaturations on room air Visibly weak and fatigued Needs outpatient pulmonary function testing  Plan: Pulmonary function testing ordered today Patient to establish care with Dr. Lamonte Sakai next month We will plan on repeating imaging of chest in 3 to 6 months or sooner based off of pulmonary function testing Continue home physical therapy Would benefit from walk in office at next visit as well  Acute respiratory failure with hypoxia [facility]) Required oxygen at discharge from the hospital Walk today in office stable with no oxygen desaturations  Plan: Obtain pulmonary function testing We will need to obtain chest imaging in 3 to 6 months may be sooner based off a pulmonary function test We will benefit from walk at next office and could consider stopping exertional oxygen at that time    Return in about 4 weeks (around 02/16/2020), or if symptoms worsen or fail to improve, for Follow up with Dr. Lamonte Sakai, Follow up for FULL PFT - 60 min.   Lauraine Rinne, NP 01/19/2020   This appointment required 34 minutes of patient care (this includes precharting, chart review, review of results, face-to-face care, etc.).

## 2020-01-20 ENCOUNTER — Telehealth (INDEPENDENT_AMBULATORY_CARE_PROVIDER_SITE_OTHER): Payer: Self-pay | Admitting: Primary Care

## 2020-01-20 DIAGNOSIS — R000 Tachycardia, unspecified: Secondary | ICD-10-CM | POA: Diagnosis not present

## 2020-01-20 DIAGNOSIS — K861 Other chronic pancreatitis: Secondary | ICD-10-CM | POA: Diagnosis not present

## 2020-01-20 DIAGNOSIS — J9601 Acute respiratory failure with hypoxia: Secondary | ICD-10-CM | POA: Diagnosis not present

## 2020-01-20 DIAGNOSIS — U071 COVID-19: Secondary | ICD-10-CM | POA: Diagnosis not present

## 2020-01-20 DIAGNOSIS — Z7952 Long term (current) use of systemic steroids: Secondary | ICD-10-CM | POA: Diagnosis not present

## 2020-01-20 DIAGNOSIS — M799 Soft tissue disorder, unspecified: Secondary | ICD-10-CM | POA: Diagnosis not present

## 2020-01-20 DIAGNOSIS — Z7901 Long term (current) use of anticoagulants: Secondary | ICD-10-CM | POA: Diagnosis not present

## 2020-01-20 DIAGNOSIS — J90 Pleural effusion, not elsewhere classified: Secondary | ICD-10-CM | POA: Diagnosis not present

## 2020-01-20 DIAGNOSIS — E785 Hyperlipidemia, unspecified: Secondary | ICD-10-CM | POA: Diagnosis not present

## 2020-01-20 DIAGNOSIS — K219 Gastro-esophageal reflux disease without esophagitis: Secondary | ICD-10-CM | POA: Diagnosis not present

## 2020-01-20 DIAGNOSIS — J45909 Unspecified asthma, uncomplicated: Secondary | ICD-10-CM | POA: Diagnosis not present

## 2020-01-20 DIAGNOSIS — E221 Hyperprolactinemia: Secondary | ICD-10-CM | POA: Diagnosis not present

## 2020-01-20 DIAGNOSIS — R918 Other nonspecific abnormal finding of lung field: Secondary | ICD-10-CM | POA: Diagnosis not present

## 2020-01-20 DIAGNOSIS — E876 Hypokalemia: Secondary | ICD-10-CM | POA: Diagnosis not present

## 2020-01-20 DIAGNOSIS — J1282 Pneumonia due to coronavirus disease 2019: Secondary | ICD-10-CM | POA: Diagnosis not present

## 2020-01-20 DIAGNOSIS — Z9981 Dependence on supplemental oxygen: Secondary | ICD-10-CM | POA: Diagnosis not present

## 2020-01-20 NOTE — Telephone Encounter (Signed)
Please advice Clair Gulling for verbal orders and patient for 4 wheel walker requested.   Thank you

## 2020-01-20 NOTE — Telephone Encounter (Signed)
Clair Gulling called to follow up on the 4 wheel walker he requested for Pt as well as Pt needing a handi cap parking plaqard   Home Health Verbal Orders - Caller/Agency: Herbert Deaner / Alvis Lemmings Callback Number: [phone] Requesting medical social worker consult for community resources  Frequency:   Pt has a history of low back disk herniation and is having difficulty getting out of chairs and has flared up/ is there a PRN med she can get to help reduce pain/please advise

## 2020-01-21 ENCOUNTER — Telehealth (INDEPENDENT_AMBULATORY_CARE_PROVIDER_SITE_OTHER): Payer: Self-pay | Admitting: Primary Care

## 2020-01-21 ENCOUNTER — Telehealth: Payer: Self-pay

## 2020-01-21 ENCOUNTER — Encounter (INDEPENDENT_AMBULATORY_CARE_PROVIDER_SITE_OTHER): Payer: Self-pay | Admitting: Primary Care

## 2020-01-21 DIAGNOSIS — K861 Other chronic pancreatitis: Secondary | ICD-10-CM | POA: Diagnosis not present

## 2020-01-21 DIAGNOSIS — E221 Hyperprolactinemia: Secondary | ICD-10-CM | POA: Diagnosis not present

## 2020-01-21 DIAGNOSIS — E785 Hyperlipidemia, unspecified: Secondary | ICD-10-CM | POA: Diagnosis not present

## 2020-01-21 DIAGNOSIS — J9601 Acute respiratory failure with hypoxia: Secondary | ICD-10-CM | POA: Diagnosis not present

## 2020-01-21 DIAGNOSIS — M799 Soft tissue disorder, unspecified: Secondary | ICD-10-CM | POA: Diagnosis not present

## 2020-01-21 DIAGNOSIS — E876 Hypokalemia: Secondary | ICD-10-CM | POA: Diagnosis not present

## 2020-01-21 DIAGNOSIS — R000 Tachycardia, unspecified: Secondary | ICD-10-CM | POA: Diagnosis not present

## 2020-01-21 DIAGNOSIS — Z7901 Long term (current) use of anticoagulants: Secondary | ICD-10-CM | POA: Diagnosis not present

## 2020-01-21 DIAGNOSIS — K219 Gastro-esophageal reflux disease without esophagitis: Secondary | ICD-10-CM | POA: Diagnosis not present

## 2020-01-21 DIAGNOSIS — J45909 Unspecified asthma, uncomplicated: Secondary | ICD-10-CM | POA: Diagnosis not present

## 2020-01-21 DIAGNOSIS — J1282 Pneumonia due to coronavirus disease 2019: Secondary | ICD-10-CM | POA: Diagnosis not present

## 2020-01-21 DIAGNOSIS — U071 COVID-19: Secondary | ICD-10-CM | POA: Diagnosis not present

## 2020-01-21 DIAGNOSIS — Z7952 Long term (current) use of systemic steroids: Secondary | ICD-10-CM | POA: Diagnosis not present

## 2020-01-21 DIAGNOSIS — Z9981 Dependence on supplemental oxygen: Secondary | ICD-10-CM | POA: Diagnosis not present

## 2020-01-21 DIAGNOSIS — R918 Other nonspecific abnormal finding of lung field: Secondary | ICD-10-CM | POA: Diagnosis not present

## 2020-01-21 DIAGNOSIS — J90 Pleural effusion, not elsewhere classified: Secondary | ICD-10-CM | POA: Diagnosis not present

## 2020-01-21 NOTE — Telephone Encounter (Signed)
Beth [name] from [place] checking on the message mentioned below, requesting a follow up call

## 2020-01-21 NOTE — Telephone Encounter (Signed)
Pt states she is returning call from practice she missed at 1542 today. NT did not see any call noted. Pt also questioning if muscle relaxer was called in and if the rollator was ordered; if so, what company. Please advise: [phone]

## 2020-01-21 NOTE — Telephone Encounter (Signed)
This encounter was created in error - please disregard.

## 2020-01-21 NOTE — Telephone Encounter (Signed)
Patient has an appointment on 8-4.   Thank you

## 2020-01-21 NOTE — Telephone Encounter (Signed)
Beth,nurse with [facility], calling to report pt. Continues to have tachycardia at rest and when ambulating. At rest 98-100. After walking 20 feet heart rate 100-120. O2 saturation is in the 90's. Pt. Feels short of breath after walking. Continues to use O2 2L/min. Also complaining of muscle spasms to low back. Requesting a muscle relaxer be sent to her pharmacy.

## 2020-01-22 ENCOUNTER — Other Ambulatory Visit (INDEPENDENT_AMBULATORY_CARE_PROVIDER_SITE_OTHER): Payer: Self-pay | Admitting: Primary Care

## 2020-01-22 DIAGNOSIS — Z7901 Long term (current) use of anticoagulants: Secondary | ICD-10-CM | POA: Diagnosis not present

## 2020-01-22 DIAGNOSIS — Z7952 Long term (current) use of systemic steroids: Secondary | ICD-10-CM | POA: Diagnosis not present

## 2020-01-22 DIAGNOSIS — E221 Hyperprolactinemia: Secondary | ICD-10-CM | POA: Diagnosis not present

## 2020-01-22 DIAGNOSIS — J1282 Pneumonia due to coronavirus disease 2019: Secondary | ICD-10-CM | POA: Diagnosis not present

## 2020-01-22 DIAGNOSIS — M799 Soft tissue disorder, unspecified: Secondary | ICD-10-CM | POA: Diagnosis not present

## 2020-01-22 DIAGNOSIS — R918 Other nonspecific abnormal finding of lung field: Secondary | ICD-10-CM | POA: Diagnosis not present

## 2020-01-22 DIAGNOSIS — J9601 Acute respiratory failure with hypoxia: Secondary | ICD-10-CM

## 2020-01-22 DIAGNOSIS — E876 Hypokalemia: Secondary | ICD-10-CM | POA: Diagnosis not present

## 2020-01-22 DIAGNOSIS — Z9981 Dependence on supplemental oxygen: Secondary | ICD-10-CM | POA: Diagnosis not present

## 2020-01-22 DIAGNOSIS — R000 Tachycardia, unspecified: Secondary | ICD-10-CM | POA: Diagnosis not present

## 2020-01-22 DIAGNOSIS — J90 Pleural effusion, not elsewhere classified: Secondary | ICD-10-CM | POA: Diagnosis not present

## 2020-01-22 DIAGNOSIS — U071 COVID-19: Secondary | ICD-10-CM | POA: Diagnosis not present

## 2020-01-22 DIAGNOSIS — K861 Other chronic pancreatitis: Secondary | ICD-10-CM | POA: Diagnosis not present

## 2020-01-22 DIAGNOSIS — I10 Essential (primary) hypertension: Secondary | ICD-10-CM

## 2020-01-22 DIAGNOSIS — E785 Hyperlipidemia, unspecified: Secondary | ICD-10-CM | POA: Diagnosis not present

## 2020-01-22 DIAGNOSIS — K219 Gastro-esophageal reflux disease without esophagitis: Secondary | ICD-10-CM | POA: Diagnosis not present

## 2020-01-22 DIAGNOSIS — R5381 Other malaise: Secondary | ICD-10-CM

## 2020-01-22 DIAGNOSIS — J45909 Unspecified asthma, uncomplicated: Secondary | ICD-10-CM | POA: Diagnosis not present

## 2020-01-22 MED ORDER — HYDROCHLOROTHIAZIDE 12.5 MG PO TABS: 12.5000 mg | ORAL_TABLET | Freq: Every day | ORAL | 1 refills | Status: DC

## 2020-01-22 MED ORDER — TIZANIDINE HCL 4 MG PO CAPS: 4.0000 mg | ORAL_CAPSULE | Freq: Three times a day (TID) | ORAL | 1 refills | Status: DC | PRN

## 2020-01-22 MED ORDER — HYDROCHLOROTHIAZIDE 25 MG PO TABS: 25.0000 mg | ORAL_TABLET | Freq: Every day | ORAL | 1 refills | Status: DC

## 2020-01-22 NOTE — Progress Notes (Signed)
Spoke with Clair Gulling called to follow up on the 4 wheel walker he requested for Pt as well as Pt needing a handi cap parking plaqard . Orders placed for DME . Family member will bring form for handicap plaqued to be sign. Patient requesting mobic and tinazidine will send both prescriptions in for pain and muscle spasm. Bp has ranged from systolic 493 to 241 and diastolic 99-14. Today was the highest at 140. No change in Bp meds HCTZ 12.5mg  daily .She is still experiencing hypoxia and shortness of breath from COVID explained just seen by pulmonology 01/19/2020 reviewed records O2 on room air 100 % if continues to have respiratory problems call Wyn Quaker Np at [place]

## 2020-01-22 NOTE — Telephone Encounter (Signed)
Please address

## 2020-01-23 ENCOUNTER — Telehealth: Payer: Self-pay | Admitting: Pulmonary Disease

## 2020-01-23 NOTE — Telephone Encounter (Signed)
Patient does not qualify for POC based on walk done in the office on 01/19/2020. Patient asking if she should refill her Eliquis. See D-dimer from 6/24 (2.1) and 6/25 (2.3), office notes refer to a two week course. Her PCP ask for pulmonary to advise on this.

## 2020-01-23 NOTE — Telephone Encounter (Signed)
Patient contacted with message from [place]. Patient plans to stop Eliquis as instructed. She is going to contact her DME and ask for smaller tanks. Reviewed follow up appointment with patient.

## 2020-01-23 NOTE — Telephone Encounter (Signed)
01/23/2020  Patient CTA chest from 12/15/2019 did not show a pulmonary embolism Patient's ultrasound from 12/18/2019 did not show DVTs  It is not our current recommendation the patient be maintained on DOAC.  We will not refill the Eliquis.  Would recommend that she no longer be maintained on Eliquis.  Keep upcoming appointment with our office with Dr. Lamonte Sakai to establish care.  Beth Quaker, FNP

## 2020-01-26 DIAGNOSIS — E876 Hypokalemia: Secondary | ICD-10-CM | POA: Diagnosis not present

## 2020-01-26 DIAGNOSIS — J90 Pleural effusion, not elsewhere classified: Secondary | ICD-10-CM | POA: Diagnosis not present

## 2020-01-26 DIAGNOSIS — Z9981 Dependence on supplemental oxygen: Secondary | ICD-10-CM | POA: Diagnosis not present

## 2020-01-26 DIAGNOSIS — E785 Hyperlipidemia, unspecified: Secondary | ICD-10-CM | POA: Diagnosis not present

## 2020-01-26 DIAGNOSIS — K219 Gastro-esophageal reflux disease without esophagitis: Secondary | ICD-10-CM | POA: Diagnosis not present

## 2020-01-26 DIAGNOSIS — J9601 Acute respiratory failure with hypoxia: Secondary | ICD-10-CM | POA: Diagnosis not present

## 2020-01-26 DIAGNOSIS — Z7901 Long term (current) use of anticoagulants: Secondary | ICD-10-CM | POA: Diagnosis not present

## 2020-01-26 DIAGNOSIS — U071 COVID-19: Secondary | ICD-10-CM | POA: Diagnosis not present

## 2020-01-26 DIAGNOSIS — J1282 Pneumonia due to coronavirus disease 2019: Secondary | ICD-10-CM | POA: Diagnosis not present

## 2020-01-26 DIAGNOSIS — E221 Hyperprolactinemia: Secondary | ICD-10-CM | POA: Diagnosis not present

## 2020-01-26 DIAGNOSIS — Z7952 Long term (current) use of systemic steroids: Secondary | ICD-10-CM | POA: Diagnosis not present

## 2020-01-26 DIAGNOSIS — J45909 Unspecified asthma, uncomplicated: Secondary | ICD-10-CM | POA: Diagnosis not present

## 2020-01-26 DIAGNOSIS — R918 Other nonspecific abnormal finding of lung field: Secondary | ICD-10-CM | POA: Diagnosis not present

## 2020-01-26 DIAGNOSIS — K861 Other chronic pancreatitis: Secondary | ICD-10-CM | POA: Diagnosis not present

## 2020-01-26 DIAGNOSIS — R000 Tachycardia, unspecified: Secondary | ICD-10-CM | POA: Diagnosis not present

## 2020-01-26 DIAGNOSIS — M799 Soft tissue disorder, unspecified: Secondary | ICD-10-CM | POA: Diagnosis not present

## 2020-01-28 ENCOUNTER — Ambulatory Visit (INDEPENDENT_AMBULATORY_CARE_PROVIDER_SITE_OTHER): Payer: BC Managed Care – PPO | Admitting: Primary Care

## 2020-01-28 ENCOUNTER — Encounter (INDEPENDENT_AMBULATORY_CARE_PROVIDER_SITE_OTHER): Payer: Self-pay | Admitting: Primary Care

## 2020-01-28 ENCOUNTER — Other Ambulatory Visit: Payer: Self-pay

## 2020-01-28 VITALS — BP 109/75 | HR 88 | Temp 98.7°F | Ht 69.0 in | Wt 265.4 lb

## 2020-01-28 DIAGNOSIS — R5381 Other malaise: Secondary | ICD-10-CM

## 2020-01-28 DIAGNOSIS — R000 Tachycardia, unspecified: Secondary | ICD-10-CM

## 2020-01-28 NOTE — Patient Instructions (Signed)
Sinus Tachycardia  Sinus tachycardia is a kind of fast heartbeat. In sinus tachycardia, the heart beats more than 100 times a minute. Sinus tachycardia starts in a part of the heart called the sinus node. Sinus tachycardia may be harmless, or it may be a sign of a serious condition. What are the causes? This condition may be caused by:  Exercise or exertion.  A fever.  Pain.  Loss of body fluids (dehydration).  Severe bleeding (hemorrhage).  Anxiety and stress.  Certain substances, including: ? Alcohol. ? Caffeine. ? Tobacco and nicotine products. ? Cold medicines. ? Illegal drugs.  Medical conditions including: ? Heart disease. ? An infection. ? An overactive thyroid (hyperthyroidism). ? A lack of red blood cells (anemia). What are the signs or symptoms? Symptoms of this condition include:  A feeling that the heart is beating quickly (palpitations).  Suddenly noticing your heartbeat (cardiac awareness).  Dizziness.  Tiredness (fatigue).  Shortness of breath.  Chest pain.  Nausea.  Fainting. How is this diagnosed? This condition is diagnosed with:  A physical exam.  Other tests, such as: ? Blood tests. ? An electrocardiogram (ECG). This test measures the electrical activity of the heart. ? Ambulatory cardiac monitor. This records your heartbeats for 24 hours or more. You may be referred to a heart specialist (cardiologist). How is this treated? Treatment for this condition depends on the cause or the underlying condition. Treatment may involve:  Treating the underlying condition.  Taking new medicines or changing your current medicines as told by your health care provider.  Making changes to your diet or lifestyle. Follow these instructions at home: Lifestyle   Do not use any products that contain nicotine or tobacco, such as cigarettes and e-cigarettes. If you need help quitting, ask your health care provider.  Do not use illegal drugs, such as  cocaine.  Learn relaxation methods to help you when you get stressed or anxious. These include deep breathing.  Avoid caffeine or other stimulants. Alcohol use   Do not drink alcohol if: ? Your health care provider tells you not to drink. ? You are pregnant, may be pregnant, or are planning to become pregnant.  If you drink alcohol, limit how much you have: ? 0-1 drink a day for women. ? 0-2 drinks a day for men.  Be aware of how much alcohol is in your drink. In the [place] one drink equals one typical bottle of beer (12 oz), one-half glass of wine (5 oz), or one shot of hard liquor (1 oz). General instructions  Drink enough fluids to keep your urine pale yellow.  Take over-the-counter and prescription medicines only as told by your health care provider.  Keep all follow-up visits as told by your health care provider. This is important. Contact a health care provider if you have:  A fever.  Vomiting or diarrhea that does not go away. Get help right away if you:  Have pain in your chest, upper arms, jaw, or neck.  Become weak or dizzy.  Feel faint.  Have palpitations that do not go away. Summary  In sinus tachycardia, the heart beats more than 100 times a minute.  Sinus tachycardia may be harmless, or it may be a sign of a serious condition.  Treatment for this condition depends on the cause or the underlying condition.  Get help right away if you have pain in your chest, upper arms, jaw, or neck. This information is not intended to replace advice given to you by   your health care provider. Make sure you discuss any questions you have with your health care provider. Document Revised: 08/01/2017 Document Reviewed: 08/01/2017 Elsevier Patient Education  2020 [employer]  

## 2020-01-28 NOTE — Progress Notes (Signed)
Established Patient Office Visit  Subjective:  Patient ID: Zahirah Cheslock, female    DOB: May 29, 1976  Age: 44 y.o. MRN: [id number]  CC:  Chief Complaint  Patient presents with  . Tachycardia    HPI Ms. Kynley Qualls is a 44 year old female having intuated problems with having COVID . She is requesting refills changes and is still being followed by therapy (PT/OT/) from deconditioning . Major concern is her heart racing . Reviewing previous notes and EKG indicating tachycardia.   Past Medical History:  Diagnosis Date  . Asthma   . Chronic pancreatitis ([place])   . Hypoglycemia   . Prolactinoma [facility])     Past Surgical History:  Procedure Laterality Date  . CHOLECYSTECTOMY    . COLONOSCOPY      Family History  Problem Relation Age of Onset  . Other Paternal Aunt        pituitary surgery    Social History   Socioeconomic History  . Marital status: Divorced    Spouse name: Not on file  . Number of children: Not on file  . Years of education: Not on file  . Highest education level: Not on file  Occupational History  . Not on file  Tobacco Use  . Smoking status: Never Smoker  . Smokeless tobacco: Never Used  Substance and Sexual Activity  . Alcohol use: Never  . Drug use: Never  . Sexual activity: Yes  Other Topics Concern  . Not on file  Social History Narrative  . Not on file   Social Determinants of Health   Financial Resource Strain:   . Difficulty of Paying Living Expenses:   Food Insecurity:   . Worried About Charity fundraiser in the Last Year:   . Arboriculturist in the Last Year:   Transportation Needs:   . Film/video editor (Medical):   Marland Kitchen Lack of Transportation (Non-Medical):   Physical Activity:   . Days of Exercise per Week:   . Minutes of Exercise per Session:   Stress:   . Feeling of Stress :   Social Connections:   . Frequency of Communication with Friends and Family:   . Frequency of Social Gatherings with Friends and Family:    . Attends Religious Services:   . Active Member of Clubs or Organizations:   . Attends Archivist Meetings:   Marland Kitchen Marital Status:   Intimate Partner Violence:   . Fear of Current or Ex-Partner:   . Emotionally Abused:   Marland Kitchen Physically Abused:   . Sexually Abused:     Outpatient Medications Prior to Visit  Medication Sig Dispense Refill  . albuterol (PROAIR HFA) 108 (90 Base) MCG/ACT inhaler Inhale 1 puff into the lungs every 4 (four) hours as needed for wheezing or shortness of breath. 6.7 g 0  . atorvastatin (LIPITOR) 10 MG tablet Take 10 mg by mouth daily.     . benzonatate (TESSALON) 200 MG capsule Take 1 capsule (200 mg total) by mouth 3 (three) times daily as needed for cough. 30 capsule 3  . bromocriptine (PARLODEL) 2.5 MG tablet Take 0.5 tablets (1.25 mg total) by mouth at bedtime. 45 tablet 3  . chlorpheniramine-HYDROcodone (TUSSIONEX PENNKINETIC ER) 10-8 MG/5ML SUER Take 5 mLs by mouth every 12 (twelve) hours as needed for cough. 120 mL 0  . ELIQUIS 2.5 MG TABS tablet TAKE 1 TABLET(2.5 MG) BY MOUTH TWICE DAILY (Patient not taking: Reported on 02/05/2020) 28 tablet 0  .  EPINEPHrine (EPIPEN 2-PAK) 0.3 mg/0.3 mL IJ SOAJ injection Inject 0.3 mg into the muscle once.     . hydrochlorothiazide (HYDRODIURIL) 12.5 MG tablet Take 1 tablet (12.5 mg total) by mouth daily. 90 tablet 1  . levonorgestrel (MIRENA) 20 MCG/24HR IUD 1 each by Intrauterine route once.     . mometasone-formoterol (DULERA) 100-5 MCG/ACT AERO Inhale 2 puffs into the lungs daily.     . naftifine (NAFTIN) 1 % cream Apply 1 application topically daily as needed (fungs infection).     Marland Kitchen omega-3 fish oil (MAXEPA) 1000 MG CAPS capsule Take 2 capsules by mouth daily.     . Pancrelipase, Lip-Prot-Amyl, (CREON) 24000-76000 units CPEP Take 2 capsules by mouth See admin instructions. Take 2 capsules three times daily with food and 1 capsule twice daily with snacks.    . pantoprazole (PROTONIX) 20 MG tablet Take 20 mg by  mouth daily.    Marland Kitchen tiZANidine (ZANAFLEX) 4 MG capsule Take 1 capsule (4 mg total) by mouth 3 (three) times daily as needed for muscle spasms. 90 capsule 1   No facility-administered medications prior to visit.    Allergies  Allergen Reactions  . [place] Swelling  . [place] Grass Hives  . Cedar Swelling  . Cladosporium Cladosporioides Hives  . Dust Mite Extract Hives  . Elm Bark [Ulmus Fulva] Swelling  . Lidocaine Hcl Swelling  . Molds & Smuts Hives  . Mugwort Hives  . Nettle [Urtica Dioica] Hives  . Sorrel-Dock Mix [Sheep Sorrel-Yellow Dock] Hives  . Timothy Grass Pollen Allergen Hives  . Xylocaine [Lidocaine] Swelling    ROS Review of Systems  Constitutional: Positive for activity change.  Respiratory: Positive for cough and shortness of breath.   Musculoskeletal: Positive for gait problem.  Neurological: Positive for weakness.  All other systems reviewed and are negative.     Objective:    Physical Exam Vitals reviewed.  Constitutional:      Appearance: She is obese.  HENT:     Head: Normocephalic.     Right Ear: Tympanic membrane normal.     Left Ear: Tympanic membrane normal.  Cardiovascular:     Rate and Rhythm: Tachycardia present. Rhythm irregular.     Heart sounds: Normal heart sounds.  Pulmonary:     Comments: Adventitious Abdominal:     General: Bowel sounds are normal.  Musculoskeletal:     Cervical back: Normal range of motion.  Skin:    General: Skin is warm.  Neurological:     Mental Status: She is alert and oriented to person, place, and time.  Psychiatric:        Mood and Affect: Mood normal.        Behavior: Behavior normal.        Thought Content: Thought content normal.     Comments: depressed     BP 109/75 (BP Location: Right Arm, Patient Position: Sitting, Cuff Size: Large)   Pulse 88   Temp 98.7 F (37.1 C) (Oral)   Ht 5\' 9"  (1.753 m)   Wt 265 lb 6.4 oz (120.4 kg)   SpO2 95%   BMI 39.19 kg/m  Wt Readings from Last 3  Encounters:  02/05/20 266 lb (120.7 kg)  01/28/20 265 lb 6.4 oz (120.4 kg)  01/19/20 (!) 261 lb 12.8 oz (118.8 kg)     Health Maintenance Due  Topic Date Due  . Hepatitis C Screening  Never done  . COVID-19 Vaccine (1) Never done  . TETANUS/TDAP  Never done  .  PAP SMEAR-Modifier  Never done  . INFLUENZA VACCINE  01/25/2020    There are no preventive care reminders to display for this patient.  Lab Results  Component Value Date   TSH 1.24 04/03/2019   Lab Results  Component Value Date   WBC 7.4 01/02/2020   HGB 12.2 01/02/2020   HCT 37.4 01/02/2020   MCV 86 01/02/2020   PLT 228 01/02/2020   Lab Results  Component Value Date   NA 140 01/02/2020   K 4.0 01/02/2020   CO2 25 01/02/2020   GLUCOSE 115 (H) 01/02/2020   BUN 9 01/02/2020   CREATININE 0.84 01/02/2020   BILITOT 0.2 01/02/2020   ALKPHOS 94 01/02/2020   AST 15 01/02/2020   ALT 30 01/02/2020   PROT 6.0 01/02/2020   ALBUMIN 3.6 (L) 01/02/2020   CALCIUM 9.1 01/02/2020   ANIONGAP 10 12/19/2019   No results found for: CHOL No results found for: HDL No results found for: LDLCALC No results found for: TRIG No results found for: CHOLHDL No results found for: HGBA1C    Assessment & Plan:  Jizel was seen today for tachycardia.  Diagnoses and all orders for this visit:  Tachycardia -     Ambulatory referral to Cardiology  Physical deconditioning -     For home use only DME 4 wheeled rolling walker with seat ([id number])    No orders of the defined types were placed in this encounter.   Follow-up: Return if symptoms worsen or fail to improve.    [name], NP

## 2020-01-28 NOTE — Progress Notes (Signed)
Resend tizanidine in tablet form insurance will not cover capsule

## 2020-01-29 ENCOUNTER — Other Ambulatory Visit (INDEPENDENT_AMBULATORY_CARE_PROVIDER_SITE_OTHER): Payer: Self-pay | Admitting: Primary Care

## 2020-01-29 ENCOUNTER — Telehealth (INDEPENDENT_AMBULATORY_CARE_PROVIDER_SITE_OTHER): Payer: Self-pay | Admitting: Primary Care

## 2020-01-29 DIAGNOSIS — Z7901 Long term (current) use of anticoagulants: Secondary | ICD-10-CM | POA: Diagnosis not present

## 2020-01-29 DIAGNOSIS — E876 Hypokalemia: Secondary | ICD-10-CM | POA: Diagnosis not present

## 2020-01-29 DIAGNOSIS — J9601 Acute respiratory failure with hypoxia: Secondary | ICD-10-CM | POA: Diagnosis not present

## 2020-01-29 DIAGNOSIS — M799 Soft tissue disorder, unspecified: Secondary | ICD-10-CM | POA: Diagnosis not present

## 2020-01-29 DIAGNOSIS — Z7952 Long term (current) use of systemic steroids: Secondary | ICD-10-CM | POA: Diagnosis not present

## 2020-01-29 DIAGNOSIS — K219 Gastro-esophageal reflux disease without esophagitis: Secondary | ICD-10-CM | POA: Diagnosis not present

## 2020-01-29 DIAGNOSIS — R000 Tachycardia, unspecified: Secondary | ICD-10-CM | POA: Diagnosis not present

## 2020-01-29 DIAGNOSIS — K861 Other chronic pancreatitis: Secondary | ICD-10-CM | POA: Diagnosis not present

## 2020-01-29 DIAGNOSIS — J1282 Pneumonia due to coronavirus disease 2019: Secondary | ICD-10-CM | POA: Diagnosis not present

## 2020-01-29 DIAGNOSIS — J45909 Unspecified asthma, uncomplicated: Secondary | ICD-10-CM | POA: Diagnosis not present

## 2020-01-29 DIAGNOSIS — J90 Pleural effusion, not elsewhere classified: Secondary | ICD-10-CM | POA: Diagnosis not present

## 2020-01-29 DIAGNOSIS — E785 Hyperlipidemia, unspecified: Secondary | ICD-10-CM | POA: Diagnosis not present

## 2020-01-29 DIAGNOSIS — R918 Other nonspecific abnormal finding of lung field: Secondary | ICD-10-CM | POA: Diagnosis not present

## 2020-01-29 DIAGNOSIS — E221 Hyperprolactinemia: Secondary | ICD-10-CM | POA: Diagnosis not present

## 2020-01-29 DIAGNOSIS — U071 COVID-19: Secondary | ICD-10-CM | POA: Diagnosis not present

## 2020-01-29 DIAGNOSIS — Z9981 Dependence on supplemental oxygen: Secondary | ICD-10-CM | POA: Diagnosis not present

## 2020-01-29 MED ORDER — TIZANIDINE HCL 4 MG PO TABS: 4.0000 mg | ORAL_TABLET | Freq: Three times a day (TID) | ORAL | 1 refills | Status: DC | PRN

## 2020-01-29 NOTE — Telephone Encounter (Signed)
Beth [name], called and is requesting to have additional hh visits for pt 1x for 3 weeks. He states that the pts blood pressure was elevated today 140/100, but pt is asymptomatic. He states that he did hear that there is a pending referral to the cardiologist.  Please advise.    [phone] secure

## 2020-01-30 NOTE — Telephone Encounter (Signed)
Copied from [place] [id number]. Topic: General - Other >> Jan 30, 2020  9:30 AM Rainey Pines A wrote: [facility] nurse stated patients Fourth and fifth fingers were numb and tight this morning and has somewhat sensation in fingers but the numbness and tightness is still here and her fingers are not swollen. Patients hr when she woke was 77 and her oxygen 97% on 2 liters .Pt rechecked after 15 min and after ambulating her heartrate was 112 and her ot saturation was 92 % on room air. After rest it went back up to 95% on room air. BP 112/75 and HR was 79. Best contact [phone]

## 2020-01-30 NOTE — Telephone Encounter (Signed)
Verbal orders have been provided. Please address other note in regards to BP and numbness. Contact the number provided with any questions or concerns.

## 2020-02-02 DIAGNOSIS — Z723 Lack of physical exercise: Secondary | ICD-10-CM | POA: Diagnosis not present

## 2020-02-03 ENCOUNTER — Telehealth (INDEPENDENT_AMBULATORY_CARE_PROVIDER_SITE_OTHER): Payer: Self-pay | Admitting: Primary Care

## 2020-02-03 DIAGNOSIS — E876 Hypokalemia: Secondary | ICD-10-CM | POA: Diagnosis not present

## 2020-02-03 DIAGNOSIS — Z9981 Dependence on supplemental oxygen: Secondary | ICD-10-CM | POA: Diagnosis not present

## 2020-02-03 DIAGNOSIS — R000 Tachycardia, unspecified: Secondary | ICD-10-CM | POA: Diagnosis not present

## 2020-02-03 DIAGNOSIS — K219 Gastro-esophageal reflux disease without esophagitis: Secondary | ICD-10-CM | POA: Diagnosis not present

## 2020-02-03 DIAGNOSIS — K861 Other chronic pancreatitis: Secondary | ICD-10-CM | POA: Diagnosis not present

## 2020-02-03 DIAGNOSIS — J9601 Acute respiratory failure with hypoxia: Secondary | ICD-10-CM | POA: Diagnosis not present

## 2020-02-03 DIAGNOSIS — J45909 Unspecified asthma, uncomplicated: Secondary | ICD-10-CM | POA: Diagnosis not present

## 2020-02-03 DIAGNOSIS — U071 COVID-19: Secondary | ICD-10-CM | POA: Diagnosis not present

## 2020-02-03 DIAGNOSIS — R918 Other nonspecific abnormal finding of lung field: Secondary | ICD-10-CM | POA: Diagnosis not present

## 2020-02-03 DIAGNOSIS — J1282 Pneumonia due to coronavirus disease 2019: Secondary | ICD-10-CM | POA: Diagnosis not present

## 2020-02-03 DIAGNOSIS — E221 Hyperprolactinemia: Secondary | ICD-10-CM | POA: Diagnosis not present

## 2020-02-03 DIAGNOSIS — Z7952 Long term (current) use of systemic steroids: Secondary | ICD-10-CM | POA: Diagnosis not present

## 2020-02-03 DIAGNOSIS — J90 Pleural effusion, not elsewhere classified: Secondary | ICD-10-CM | POA: Diagnosis not present

## 2020-02-03 DIAGNOSIS — M799 Soft tissue disorder, unspecified: Secondary | ICD-10-CM | POA: Diagnosis not present

## 2020-02-03 DIAGNOSIS — E785 Hyperlipidemia, unspecified: Secondary | ICD-10-CM | POA: Diagnosis not present

## 2020-02-03 DIAGNOSIS — Z7901 Long term (current) use of anticoagulants: Secondary | ICD-10-CM | POA: Diagnosis not present

## 2020-02-03 NOTE — Telephone Encounter (Signed)
Call sent to office- requires  orders for services requested

## 2020-02-03 NOTE — Telephone Encounter (Signed)
Copied from [place] [id number]. Topic: Quick Communication - Home Health Verbal Orders >> Feb 03, 2020  4:05 PM Leward Quan A wrote: Caller/Agency: [facility]  Callback Number: [phone] Requesting OT/PT/Skilled Nursing/Social Work/Speech Therapy: additional nursing  Frequency: 1 x w 2 week

## 2020-02-04 DIAGNOSIS — J45909 Unspecified asthma, uncomplicated: Secondary | ICD-10-CM | POA: Diagnosis not present

## 2020-02-04 DIAGNOSIS — Z7952 Long term (current) use of systemic steroids: Secondary | ICD-10-CM | POA: Diagnosis not present

## 2020-02-04 DIAGNOSIS — E785 Hyperlipidemia, unspecified: Secondary | ICD-10-CM | POA: Diagnosis not present

## 2020-02-04 DIAGNOSIS — E876 Hypokalemia: Secondary | ICD-10-CM | POA: Diagnosis not present

## 2020-02-04 DIAGNOSIS — J9601 Acute respiratory failure with hypoxia: Secondary | ICD-10-CM | POA: Diagnosis not present

## 2020-02-04 DIAGNOSIS — R918 Other nonspecific abnormal finding of lung field: Secondary | ICD-10-CM | POA: Diagnosis not present

## 2020-02-04 DIAGNOSIS — R000 Tachycardia, unspecified: Secondary | ICD-10-CM | POA: Diagnosis not present

## 2020-02-04 DIAGNOSIS — E221 Hyperprolactinemia: Secondary | ICD-10-CM | POA: Diagnosis not present

## 2020-02-04 DIAGNOSIS — M799 Soft tissue disorder, unspecified: Secondary | ICD-10-CM | POA: Diagnosis not present

## 2020-02-04 DIAGNOSIS — K861 Other chronic pancreatitis: Secondary | ICD-10-CM | POA: Diagnosis not present

## 2020-02-04 DIAGNOSIS — K219 Gastro-esophageal reflux disease without esophagitis: Secondary | ICD-10-CM | POA: Diagnosis not present

## 2020-02-04 DIAGNOSIS — J1282 Pneumonia due to coronavirus disease 2019: Secondary | ICD-10-CM | POA: Diagnosis not present

## 2020-02-04 DIAGNOSIS — Z9981 Dependence on supplemental oxygen: Secondary | ICD-10-CM | POA: Diagnosis not present

## 2020-02-04 DIAGNOSIS — U071 COVID-19: Secondary | ICD-10-CM | POA: Diagnosis not present

## 2020-02-04 DIAGNOSIS — J90 Pleural effusion, not elsewhere classified: Secondary | ICD-10-CM | POA: Diagnosis not present

## 2020-02-04 DIAGNOSIS — Z7901 Long term (current) use of anticoagulants: Secondary | ICD-10-CM | POA: Diagnosis not present

## 2020-02-05 ENCOUNTER — Ambulatory Visit: Payer: BC Managed Care – PPO | Admitting: Emergency Medicine

## 2020-02-05 ENCOUNTER — Other Ambulatory Visit: Payer: Self-pay

## 2020-02-05 ENCOUNTER — Encounter: Payer: Self-pay | Admitting: Emergency Medicine

## 2020-02-05 DIAGNOSIS — R0683 Snoring: Secondary | ICD-10-CM | POA: Insufficient documentation

## 2020-02-05 DIAGNOSIS — J9601 Acute respiratory failure with hypoxia: Secondary | ICD-10-CM

## 2020-02-05 DIAGNOSIS — U071 COVID-19: Secondary | ICD-10-CM | POA: Diagnosis not present

## 2020-02-05 DIAGNOSIS — J45909 Unspecified asthma, uncomplicated: Secondary | ICD-10-CM

## 2020-02-05 DIAGNOSIS — J1282 Pneumonia due to coronavirus disease 2019: Secondary | ICD-10-CM

## 2020-02-05 NOTE — Assessment & Plan Note (Addendum)
Significant improvement in bilateral pulmonary infiltrates on most recent CT chest but with continued peripheral residual interstitial changes and scar.  Suspect that this will remain, low likelihood that it will flare or progress but we will need to maintain surveillance for this as it has been reported in the post Covid population. Plan to repeat in 6-12 months or sooner if any clinical change.   She remains deconditioned although she is regaining some of her stamina.  She has been working from home.  I do not believe she could undertake a job that required a lot of ambulation, lifting, carrying.  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

## 2020-02-05 NOTE — Assessment & Plan Note (Signed)
Snoring with other symptoms suggestive of obstructive sleep apnea.  Discussed with her and she agrees to undergo a split-night sleep study.  I suspect she will have OSA.  If so she is willing to consider CPAP.

## 2020-02-05 NOTE — Assessment & Plan Note (Signed)
We will perform walking oximetry today to see if she still requires supplemental oxygen.  If not then we will discontinue

## 2020-02-05 NOTE — Patient Instructions (Signed)
We will test your walking oximetry today to see if he still need to wear oxygen We will review your pulmonary function testing after it is done We will arrange for a sleep study We will plan to repeat your CT scan of the chest at some point going forward, probably in 6 to 12 months, to ensure stability and no progression of scarring Stop Dulera for now Keep your albuterol available use 2 puffs when needed for shortness of breath, chest tightness, wheezing. Follow with Dr Lamonte Sakai next available after your studies are completed to review together

## 2020-02-05 NOTE — Addendum Note (Signed)
Addended by: Gavin Potters R on: 02/05/2020 02:55 PM   Modules accepted: Orders

## 2020-02-05 NOTE — Assessment & Plan Note (Addendum)
Reported history of asthma, no PFTs available to me right now.  We will plan to get PFT and assess degree of obstructive lung disease.  Plan to stop her [facility] for now, keep albuterol available.  If she has obstruction on PFT then we can consider adding back long-acting medication

## 2020-02-05 NOTE — Progress Notes (Signed)
Subjective:    Patient ID: Beth [name], female    DOB: Jun 18, 1976, 44 y.o.   MRN: [id number]  HPI 44 year old woman, never smoker, with a history of chronic pancreatitis, childhood asthma that has become active again as an adult when she was pregnant in 2000, seen initially in June 2021 when she was admitted with COVID-19 pneumonia.  Note was made on a CT angio chest 12/15/2019 that she had no evidence for PE but there was marked severe bilateral diffuse infiltrates consistent with a viral pneumonia.  Also mention was 4.4 x 3.1 cm heterogeneous soft tissue mass along the right paratracheal region. Has remained on Dulera, rarely uses albuterol. She snores. Not well-rested in the am, she takes naps. She has fallen asleep accidentally before, evan at a stoplight.   She is significantly better since discharge, still using O2. Has some dyspnea with a lot of speaking, with steady exertion. Has not really increased her activity yet.   CT chest 01/13/2020 reviewed by me, shows large-scale resolution of her bilateral infiltrates with residual peripheral scar and no further groundglass.  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   Review of Systems As per HPI  Past Medical History:  Diagnosis Date  . Asthma   . Chronic pancreatitis ([place])   . Hypoglycemia   . Prolactinoma ([place])      Family History  Problem Relation Age of Onset  . Other Paternal Aunt        pituitary surgery     Social History   Socioeconomic History  . Marital status: Divorced    Spouse name: Not on file  . Number of children: Not on file  . Years of education: Not on file  . Highest education level: Not on file  Occupational History  . Not on file  Tobacco Use  . Smoking status: Never Smoker  . Smokeless tobacco: Never Used  Substance and Sexual Activity  . Alcohol use: Never  . Drug use: Never  . Sexual activity: Yes  Other Topics  Concern  . Not on file  Social History Narrative  . Not on file   Social Determinants of Health   Financial Resource Strain:   . Difficulty of Paying Living Expenses:   Food Insecurity:   . Worried About Charity fundraiser in the Last Year:   . Arboriculturist in the Last Year:   Transportation Needs:   . Film/video editor (Medical):   Marland Kitchen Lack of Transportation (Non-Medical):   Physical Activity:   . Days of Exercise per Week:   . Minutes of Exercise per Session:   Stress:   . Feeling of Stress :   Social Connections:   . Frequency of Communication with Friends and Family:   . Frequency of Social Gatherings with Friends and Family:   . Attends Religious Services:   . Active Member of Clubs or Organizations:   . Attends Archivist Meetings:   Marland Kitchen Marital Status:   Intimate Partner Violence:   . Fear of Current or Ex-Partner:   . Emotionally Abused:   Marland Kitchen Physically Abused:   . Sexually Abused:     Works as a Landscape architect, currently working from home.     Allergies  Allergen Reactions  . [place] Swelling  . [place] Grass Hives  . Cedar Swelling  . Cladosporium Cladosporioides Hives  . Dust Mite Extract Hives  . Elm  Bark [Ulmus Fulva] Swelling  . Lidocaine Hcl Swelling  . Molds & Smuts Hives  . Mugwort Hives  . Nettle [Urtica Dioica] Hives  . Sorrel-Dock Mix [Sheep Sorrel-Yellow Dock] Hives  . Timothy Grass Pollen Allergen Hives  . Xylocaine [Lidocaine] Swelling     Outpatient Medications Prior to Visit  Medication Sig Dispense Refill  . albuterol (PROAIR HFA) 108 (90 Base) MCG/ACT inhaler Inhale 1 puff into the lungs every 4 (four) hours as needed for wheezing or shortness of breath. 6.7 g 0  . atorvastatin (LIPITOR) 10 MG tablet Take 10 mg by mouth daily.     . benzonatate (TESSALON) 200 MG capsule Take 1 capsule (200 mg total) by mouth 3 (three) times daily as needed for cough. 30 capsule 3  . bromocriptine (PARLODEL) 2.5 MG tablet Take 0.5 tablets  (1.25 mg total) by mouth at bedtime. 45 tablet 3  . chlorpheniramine-HYDROcodone (TUSSIONEX PENNKINETIC ER) 10-8 MG/5ML SUER Take 5 mLs by mouth every 12 (twelve) hours as needed for cough. 120 mL 0  . EPINEPHrine (EPIPEN 2-PAK) 0.3 mg/0.3 mL IJ SOAJ injection Inject 0.3 mg into the muscle once.     . hydrochlorothiazide (HYDRODIURIL) 12.5 MG tablet Take 1 tablet (12.5 mg total) by mouth daily. 90 tablet 1  . levonorgestrel (MIRENA) 20 MCG/24HR IUD 1 each by Intrauterine route once.     . mometasone-formoterol (DULERA) 100-5 MCG/ACT AERO Inhale 2 puffs into the lungs daily.     . naftifine (NAFTIN) 1 % cream Apply 1 application topically daily as needed (fungs infection).     Marland Kitchen omega-3 fish oil (MAXEPA) 1000 MG CAPS capsule Take 2 capsules by mouth daily.     . Pancrelipase, Lip-Prot-Amyl, (CREON) 24000-76000 units CPEP Take 2 capsules by mouth See admin instructions. Take 2 capsules three times daily with food and 1 capsule twice daily with snacks.    . pantoprazole (PROTONIX) 20 MG tablet Take 20 mg by mouth daily.    Marland Kitchen tiZANidine (ZANAFLEX) 4 MG tablet Take 1 tablet (4 mg total) by mouth every 8 (eight) hours as needed for muscle spasms. 90 tablet 1  . ELIQUIS 2.5 MG TABS tablet TAKE 1 TABLET(2.5 MG) BY MOUTH TWICE DAILY (Patient not taking: Reported on 02/05/2020) 28 tablet 0   No facility-administered medications prior to visit.        Objective:   Physical Exam Vitals:   02/05/20 1348  BP: 116/70  Pulse: 83  Temp: 97.6 F (36.4 C)  TempSrc: Temporal  SpO2: 99%  Weight: 266 lb (120.7 kg)  Height: 5\' 9"  (1.753 m)   Gen: Pleasant, well-nourished, in no distress,  normal affect  ENT: No lesions,  mouth clear,  oropharynx clear, no postnasal drip  Neck: No JVD, no stridor  Lungs: No use of accessory muscles, no wheeze or crackles.   Cardiovascular: RRR, heart sounds normal, no murmur or gallops, no peripheral edema  Musculoskeletal: No deformities, no cyanosis or  clubbing  Neuro: alert, awake, non focal  Skin: Warm, no lesions or rash      Assessment & Plan:  Asthma without acute exacerbation Reported history of asthma, no PFTs available to me right now.  We will plan to get PFT and assess degree of obstructive lung disease.  Plan to stop her [facility] for now, keep albuterol available.  If she has obstruction on PFT then we can consider adding back long-acting medication  Pneumonia due to COVID-19 virus Significant improvement in bilateral pulmonary infiltrates on most recent CT  chest but with continued peripheral residual interstitial changes and scar.  Suspect that this will remain, low likelihood that it will flare or progress but we will need to maintain surveillance for this as it has been reported in the post Covid population. Plan to repeat in 6-12 months or sooner if any clinical change.   She remains deconditioned although she is regaining some of her stamina.  She has been working from home.  I do not believe she could undertake a job that required a lot of ambulation, lifting, carrying.  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  Acute respiratory failure with hypoxia (HCC) We will perform walking oximetry today to see if she still requires supplemental oxygen.  If not then we will discontinue  Snoring Snoring with other symptoms suggestive of obstructive sleep apnea.  Discussed with her and she agrees to undergo a split-night sleep study.  I suspect she will have OSA.  If so she is willing to consider CPAP.   Baltazar Apo, MD, PhD 02/05/2020, 2:31 PM [facility] [phone] or if no answer [phone]

## 2020-02-09 DIAGNOSIS — U071 COVID-19: Secondary | ICD-10-CM | POA: Diagnosis not present

## 2020-02-09 DIAGNOSIS — K861 Other chronic pancreatitis: Secondary | ICD-10-CM | POA: Diagnosis not present

## 2020-02-09 DIAGNOSIS — M799 Soft tissue disorder, unspecified: Secondary | ICD-10-CM | POA: Diagnosis not present

## 2020-02-09 DIAGNOSIS — J90 Pleural effusion, not elsewhere classified: Secondary | ICD-10-CM | POA: Diagnosis not present

## 2020-02-09 DIAGNOSIS — J1282 Pneumonia due to coronavirus disease 2019: Secondary | ICD-10-CM | POA: Diagnosis not present

## 2020-02-09 DIAGNOSIS — J9601 Acute respiratory failure with hypoxia: Secondary | ICD-10-CM | POA: Diagnosis not present

## 2020-02-09 DIAGNOSIS — J45909 Unspecified asthma, uncomplicated: Secondary | ICD-10-CM | POA: Diagnosis not present

## 2020-02-09 DIAGNOSIS — E785 Hyperlipidemia, unspecified: Secondary | ICD-10-CM | POA: Diagnosis not present

## 2020-02-09 DIAGNOSIS — R000 Tachycardia, unspecified: Secondary | ICD-10-CM | POA: Diagnosis not present

## 2020-02-09 DIAGNOSIS — Z7952 Long term (current) use of systemic steroids: Secondary | ICD-10-CM | POA: Diagnosis not present

## 2020-02-09 DIAGNOSIS — Z7901 Long term (current) use of anticoagulants: Secondary | ICD-10-CM | POA: Diagnosis not present

## 2020-02-09 DIAGNOSIS — E221 Hyperprolactinemia: Secondary | ICD-10-CM | POA: Diagnosis not present

## 2020-02-09 DIAGNOSIS — Z9981 Dependence on supplemental oxygen: Secondary | ICD-10-CM | POA: Diagnosis not present

## 2020-02-09 DIAGNOSIS — E876 Hypokalemia: Secondary | ICD-10-CM | POA: Diagnosis not present

## 2020-02-09 DIAGNOSIS — K219 Gastro-esophageal reflux disease without esophagitis: Secondary | ICD-10-CM | POA: Diagnosis not present

## 2020-02-09 DIAGNOSIS — R918 Other nonspecific abnormal finding of lung field: Secondary | ICD-10-CM | POA: Diagnosis not present

## 2020-02-10 DIAGNOSIS — J45909 Unspecified asthma, uncomplicated: Secondary | ICD-10-CM | POA: Diagnosis not present

## 2020-02-10 DIAGNOSIS — J1282 Pneumonia due to coronavirus disease 2019: Secondary | ICD-10-CM | POA: Diagnosis not present

## 2020-02-10 DIAGNOSIS — E785 Hyperlipidemia, unspecified: Secondary | ICD-10-CM | POA: Diagnosis not present

## 2020-02-10 DIAGNOSIS — M799 Soft tissue disorder, unspecified: Secondary | ICD-10-CM | POA: Diagnosis not present

## 2020-02-10 DIAGNOSIS — Z9981 Dependence on supplemental oxygen: Secondary | ICD-10-CM | POA: Diagnosis not present

## 2020-02-10 DIAGNOSIS — E221 Hyperprolactinemia: Secondary | ICD-10-CM | POA: Diagnosis not present

## 2020-02-10 DIAGNOSIS — Z7901 Long term (current) use of anticoagulants: Secondary | ICD-10-CM | POA: Diagnosis not present

## 2020-02-10 DIAGNOSIS — R918 Other nonspecific abnormal finding of lung field: Secondary | ICD-10-CM | POA: Diagnosis not present

## 2020-02-10 DIAGNOSIS — Z7952 Long term (current) use of systemic steroids: Secondary | ICD-10-CM | POA: Diagnosis not present

## 2020-02-10 DIAGNOSIS — E876 Hypokalemia: Secondary | ICD-10-CM | POA: Diagnosis not present

## 2020-02-10 DIAGNOSIS — J90 Pleural effusion, not elsewhere classified: Secondary | ICD-10-CM | POA: Diagnosis not present

## 2020-02-10 DIAGNOSIS — K219 Gastro-esophageal reflux disease without esophagitis: Secondary | ICD-10-CM | POA: Diagnosis not present

## 2020-02-10 DIAGNOSIS — J9601 Acute respiratory failure with hypoxia: Secondary | ICD-10-CM | POA: Diagnosis not present

## 2020-02-10 DIAGNOSIS — R000 Tachycardia, unspecified: Secondary | ICD-10-CM | POA: Diagnosis not present

## 2020-02-10 DIAGNOSIS — K861 Other chronic pancreatitis: Secondary | ICD-10-CM | POA: Diagnosis not present

## 2020-02-10 DIAGNOSIS — U071 COVID-19: Secondary | ICD-10-CM | POA: Diagnosis not present

## 2020-02-17 DIAGNOSIS — R000 Tachycardia, unspecified: Secondary | ICD-10-CM | POA: Diagnosis not present

## 2020-02-17 DIAGNOSIS — Z9981 Dependence on supplemental oxygen: Secondary | ICD-10-CM | POA: Diagnosis not present

## 2020-02-17 DIAGNOSIS — J90 Pleural effusion, not elsewhere classified: Secondary | ICD-10-CM | POA: Diagnosis not present

## 2020-02-17 DIAGNOSIS — U071 COVID-19: Secondary | ICD-10-CM | POA: Diagnosis not present

## 2020-02-17 DIAGNOSIS — K219 Gastro-esophageal reflux disease without esophagitis: Secondary | ICD-10-CM | POA: Diagnosis not present

## 2020-02-17 DIAGNOSIS — Z7901 Long term (current) use of anticoagulants: Secondary | ICD-10-CM | POA: Diagnosis not present

## 2020-02-17 DIAGNOSIS — J9601 Acute respiratory failure with hypoxia: Secondary | ICD-10-CM | POA: Diagnosis not present

## 2020-02-17 DIAGNOSIS — Z7952 Long term (current) use of systemic steroids: Secondary | ICD-10-CM | POA: Diagnosis not present

## 2020-02-17 DIAGNOSIS — E221 Hyperprolactinemia: Secondary | ICD-10-CM | POA: Diagnosis not present

## 2020-02-17 DIAGNOSIS — K861 Other chronic pancreatitis: Secondary | ICD-10-CM | POA: Diagnosis not present

## 2020-02-17 DIAGNOSIS — J45909 Unspecified asthma, uncomplicated: Secondary | ICD-10-CM | POA: Diagnosis not present

## 2020-02-17 DIAGNOSIS — J1282 Pneumonia due to coronavirus disease 2019: Secondary | ICD-10-CM | POA: Diagnosis not present

## 2020-02-17 DIAGNOSIS — R918 Other nonspecific abnormal finding of lung field: Secondary | ICD-10-CM | POA: Diagnosis not present

## 2020-02-17 DIAGNOSIS — E785 Hyperlipidemia, unspecified: Secondary | ICD-10-CM | POA: Diagnosis not present

## 2020-02-17 DIAGNOSIS — E876 Hypokalemia: Secondary | ICD-10-CM | POA: Diagnosis not present

## 2020-02-17 DIAGNOSIS — M799 Soft tissue disorder, unspecified: Secondary | ICD-10-CM | POA: Diagnosis not present

## 2020-02-18 ENCOUNTER — Telehealth (INDEPENDENT_AMBULATORY_CARE_PROVIDER_SITE_OTHER): Payer: Self-pay | Admitting: Primary Care

## 2020-02-18 DIAGNOSIS — Z9981 Dependence on supplemental oxygen: Secondary | ICD-10-CM | POA: Diagnosis not present

## 2020-02-18 DIAGNOSIS — M799 Soft tissue disorder, unspecified: Secondary | ICD-10-CM | POA: Diagnosis not present

## 2020-02-18 DIAGNOSIS — E221 Hyperprolactinemia: Secondary | ICD-10-CM | POA: Diagnosis not present

## 2020-02-18 DIAGNOSIS — J45909 Unspecified asthma, uncomplicated: Secondary | ICD-10-CM | POA: Diagnosis not present

## 2020-02-18 DIAGNOSIS — Z7901 Long term (current) use of anticoagulants: Secondary | ICD-10-CM | POA: Diagnosis not present

## 2020-02-18 DIAGNOSIS — E785 Hyperlipidemia, unspecified: Secondary | ICD-10-CM | POA: Diagnosis not present

## 2020-02-18 DIAGNOSIS — K861 Other chronic pancreatitis: Secondary | ICD-10-CM | POA: Diagnosis not present

## 2020-02-18 DIAGNOSIS — U071 COVID-19: Secondary | ICD-10-CM | POA: Diagnosis not present

## 2020-02-18 DIAGNOSIS — R000 Tachycardia, unspecified: Secondary | ICD-10-CM | POA: Diagnosis not present

## 2020-02-18 DIAGNOSIS — J9601 Acute respiratory failure with hypoxia: Secondary | ICD-10-CM | POA: Diagnosis not present

## 2020-02-18 DIAGNOSIS — Z7952 Long term (current) use of systemic steroids: Secondary | ICD-10-CM | POA: Diagnosis not present

## 2020-02-18 DIAGNOSIS — J1282 Pneumonia due to coronavirus disease 2019: Secondary | ICD-10-CM | POA: Diagnosis not present

## 2020-02-18 DIAGNOSIS — E876 Hypokalemia: Secondary | ICD-10-CM | POA: Diagnosis not present

## 2020-02-18 DIAGNOSIS — J90 Pleural effusion, not elsewhere classified: Secondary | ICD-10-CM | POA: Diagnosis not present

## 2020-02-18 DIAGNOSIS — K219 Gastro-esophageal reflux disease without esophagitis: Secondary | ICD-10-CM | POA: Diagnosis not present

## 2020-02-18 DIAGNOSIS — R918 Other nonspecific abnormal finding of lung field: Secondary | ICD-10-CM | POA: Diagnosis not present

## 2020-02-18 NOTE — Telephone Encounter (Signed)
Called to inform the doctor that patient is discharging from home health today.  Recommendation is for outpatient PT at [facility], for back pain and referral for ortho.  Any questions, please call [phone]

## 2020-02-19 ENCOUNTER — Encounter: Payer: Self-pay | Admitting: Cardiology

## 2020-02-19 ENCOUNTER — Ambulatory Visit: Payer: BC Managed Care – PPO | Admitting: Cardiology

## 2020-02-19 ENCOUNTER — Ambulatory Visit: Payer: BC Managed Care – PPO

## 2020-02-19 ENCOUNTER — Other Ambulatory Visit: Payer: Self-pay

## 2020-02-19 VITALS — BP 133/78 | HR 79 | Resp 16 | Ht 69.0 in | Wt 264.0 lb

## 2020-02-19 DIAGNOSIS — E782 Mixed hyperlipidemia: Secondary | ICD-10-CM | POA: Diagnosis not present

## 2020-02-19 DIAGNOSIS — U071 COVID-19: Secondary | ICD-10-CM

## 2020-02-19 DIAGNOSIS — I10 Essential (primary) hypertension: Secondary | ICD-10-CM

## 2020-02-19 DIAGNOSIS — R9431 Abnormal electrocardiogram [ECG] [EKG]: Secondary | ICD-10-CM

## 2020-02-19 DIAGNOSIS — I479 Paroxysmal tachycardia, unspecified: Secondary | ICD-10-CM

## 2020-02-19 DIAGNOSIS — R072 Precordial pain: Secondary | ICD-10-CM | POA: Diagnosis not present

## 2020-02-19 NOTE — Progress Notes (Signed)
Date:  02/19/2020  ID:  Michelle Wnek, DOB 15-Nov-1975, MRN [id number]  PCP:  [name], NP  Cardiologist:  Rex Kras, DO, [facility] (established care 02/19/2020)  REASON FOR CONSULT: Tachycardia with exertion.   REQUESTING PHYSICIAN:  [name], NP [address] [place],  [place] [address]  Chief Complaint  Patient presents with  . Tachycardia  . New Patient (Initial Visit)    HPI  Beth [name] is a 44 y.o. female who presents to the office with a chief complaint of "sinus tachycardia since COVID infection." She is referred to the office at the request of [name], NP. Patient's past medical history and cardiovascular risk factors include: History of COVID-19 infection, hyperlipidemia, hypertension, obesity due to excess calories.  Patient is referred to the office at the request of her primary care provider for evaluation of tachycardia with effort related activities.   Patient states that she was diagnosed with COVID-19 infection back in June 2021. She had gone to the ER was diagnosed with right sided pneumonia and was discharged home. However since his symptoms continue to worsen she was hospitalized end of June 2021 as she had developed new left-sided pneumonia and hypoxia. Patient was discharged on December 19, 2019 according to her with home health care. Patient states that the home health care nurse noted that her heart rate has been elevated despite normal pulse ox. Patient states that initially her heart rate would range between 120-130 bpm. And lately an effort related activity and her heart rate has been as high as 158 bpm. At times she does get effort related dyspnea for which she has to rest the symptoms to be alleviated.  Patient states that when her heart rate is greater than 130 bpm she experiences chest pressure like sensation with shortness of breath. The pressure-like sensation is located substernally, better with resting, not able to  comment on if it is effort related and she currently uses a four-wheel walker.  Since COVID-19 pneumonia she has been ambulating with a cane or four-wheel walker.   Patient's PFT and sleep study are not forthcoming.  HLD: primary   No family history of premature coronary disease or sudden cardiac death.  Denies prior history of coronary artery disease, myocardial infarction, congestive heart failure, deep venous thrombosis, pulmonary embolism, stroke, transient ischemic attack.  FUNCTIONAL STATUS: No structured exercise program or daily routine.   ALLERGIES: Allergies  Allergen Reactions  . [place] Swelling  . [place] Grass Hives  . Cedar Swelling  . Cladosporium Cladosporioides Hives  . Dust Mite Extract Hives  . Elm Bark [Ulmus Fulva] Swelling  . Lidocaine Hcl Swelling  . Molds & Smuts Hives  . Mugwort Hives  . Nettle [Urtica Dioica] Hives  . Sorrel-Dock Mix [Sheep Sorrel-Yellow Dock] Hives  . Timothy Grass Pollen Allergen Hives  . Xylocaine [Lidocaine] Swelling    MEDICATION LIST PRIOR TO VISIT: Current Meds  Medication Sig  . albuterol (PROAIR HFA) 108 (90 Base) MCG/ACT inhaler Inhale 1 puff into the lungs every 4 (four) hours as needed for wheezing or shortness of breath.  Marland Kitchen atorvastatin (LIPITOR) 10 MG tablet Take 10 mg by mouth daily.   . benzonatate (TESSALON) 200 MG capsule Take 1 capsule (200 mg total) by mouth 3 (three) times daily as needed for cough.  . bromocriptine (PARLODEL) 2.5 MG tablet Take 0.5 tablets (1.25 mg total) by mouth at bedtime.  . chlorpheniramine-HYDROcodone (TUSSIONEX PENNKINETIC ER) 10-8 MG/5ML SUER Take 5 mLs by mouth every  12 (twelve) hours as needed for cough.  . EPINEPHrine (EPIPEN 2-PAK) 0.3 mg/0.3 mL IJ SOAJ injection Inject 0.3 mg into the muscle once.   . hydrochlorothiazide (HYDRODIURIL) 12.5 MG tablet Take 1 tablet (12.5 mg total) by mouth daily.  Marland Kitchen levonorgestrel (MIRENA) 20 MCG/24HR IUD 1 each by Intrauterine route once.   .  mometasone-formoterol (DULERA) 100-5 MCG/ACT AERO Inhale 2 puffs into the lungs daily.   . naftifine (NAFTIN) 1 % cream Apply 1 application topically daily as needed (fungs infection).   Marland Kitchen omega-3 fish oil (MAXEPA) 1000 MG CAPS capsule Take 2 capsules by mouth daily.   . Pancrelipase, Lip-Prot-Amyl, (CREON) 24000-76000 units CPEP Take 2 capsules by mouth See admin instructions. Take 2 capsules three times daily with food and 1 capsule twice daily with snacks.  . pantoprazole (PROTONIX) 20 MG tablet Take 20 mg by mouth daily.  Marland Kitchen tiZANidine (ZANAFLEX) 4 MG tablet Take 1 tablet (4 mg total) by mouth every 8 (eight) hours as needed for muscle spasms.     PAST MEDICAL HISTORY: Past Medical History:  Diagnosis Date  . Asthma   . Chronic pancreatitis ([place])   . Hyperlipidemia   . Hypertension   . Hypoglycemia   . Obesity   . Prolactinoma ([place])     PAST SURGICAL HISTORY: Past Surgical History:  Procedure Laterality Date  . CHOLECYSTECTOMY    . COLONOSCOPY      FAMILY HISTORY: The patient family history includes Other in her paternal aunt.  SOCIAL HISTORY:  The patient  reports that she has never smoked. She has never used smokeless tobacco. She reports that she does not drink alcohol and does not use drugs.  REVIEW OF SYSTEMS: Review of Systems  Constitutional: Negative for chills and fever.  HENT: Negative for hoarse voice and nosebleeds.   Eyes: Negative for discharge, double vision and pain.  Cardiovascular: Positive for chest pain. Negative for claudication, dyspnea on exertion, leg swelling, near-syncope, orthopnea, palpitations, paroxysmal nocturnal dyspnea and syncope.  Respiratory: Positive for shortness of breath. Negative for hemoptysis.   Musculoskeletal: Negative for muscle cramps and myalgias.  Gastrointestinal: Negative for abdominal pain, constipation, diarrhea, hematemesis, hematochezia, melena, nausea and vomiting.  Neurological: Negative for dizziness and  light-headedness.    PHYSICAL EXAM: Vitals with BMI 02/19/2020 02/05/2020 01/28/2020  Height 5\' 9"  5\' 9"  5\' 9"   Weight 264 lbs 266 lbs 265 lbs 6 oz  BMI 38.97 26.94 85.46  Systolic 270 350 093  Diastolic 78 70 75  Pulse 79 83 88    CONSTITUTIONAL: Appears older than stated age, ambulates with cane or 4 wheel walker, hemodynamically stable, no acute distress.   SKIN: Skin is warm and dry. No rash noted. No cyanosis. No pallor. No jaundice HEAD: Normocephalic and atraumatic.  EYES: No scleral icterus MOUTH/THROAT: Moist oral membranes.  NECK: No JVD present. No thyromegaly noted. No carotid bruits  LYMPHATIC: No visible cervical adenopathy.  CHEST Normal respiratory effort. No intercostal retractions  LUNGS: Clear to auscultation bilaterally. no stridor. No wheezes. No rales.  CARDIOVASCULAR: Regular rate and rhythm, positive S1-S2, no murmurs rubs or gallops appreciated. ABDOMINAL: Obese, soft, nontender, nondistended, positive bowel sounds all 4 quadrants. No apparent ascites.  EXTREMITIES: No peripheral edema  HEMATOLOGIC: No significant bruising NEUROLOGIC: Oriented to person, place, and time. Nonfocal. Normal muscle tone.  PSYCHIATRIC: Normal mood and affect. Normal behavior. Cooperative  CARDIAC DATABASE: EKG: 02/19/2020: Sinus  Rhythm, 79bpm, PRWP, consider old anterior infarct, low voltage, nonspecific T-abnormality.  Echocardiogram: 11/29/2009 performed  at [facility] heart center: Patient was provided the records. LVEF greater than 55%.  Trace MR, trace TR, trileaflet aortic valve, trace PR, no pericardial effusion.  Stress Testing: None   Heart Catheterization: None  LABORATORY DATA: CBC Latest Ref Rng & Units 01/02/2020 12/19/2019 12/18/2019  WBC 3.4 - 10.8 x10E3/uL 7.4 14.4(H) 16.1(H)  Hemoglobin 11.1 - 15.9 g/dL 12.2 10.7(L) 10.5(L)  Hematocrit 34.0 - 46.6 % 37.4 31.9(L) 31.1(L)  Platelets 150 - 450 x10E3/uL 228 480(H) 445(H)    CMP Latest Ref Rng & Units 01/02/2020  12/19/2019 12/18/2019  Glucose 65 - 99 mg/dL 115(H) 224(H) 169(H)  BUN 6 - 24 mg/dL 9 10 10   Creatinine 0.57 - 1.00 mg/dL 0.84 0.82 0.81  Sodium 134 - 144 mmol/L 140 138 139  Potassium 3.5 - 5.2 mmol/L 4.0 4.2 3.8  Chloride 96 - 106 mmol/L 102 102 104  CO2 20 - 29 mmol/L 25 26 25   Calcium 8.7 - 10.2 mg/dL 9.1 8.4(L) 8.4(L)  Total Protein 6.0 - 8.5 g/dL 6.0 5.7(L) 5.9(L)  Total Bilirubin 0.0 - 1.2 mg/dL 0.2 0.5 0.5  Alkaline Phos 48 - 121 IU/L 94 60 64  AST 0 - 40 IU/L 15 20 19   ALT 0 - 32 IU/L 30 27 26     Lipid Panel  No results found for: CHOL, TRIG, HDL, CHOLHDL, VLDL, LDLCALC, LDLDIRECT, LABVLDL  No components found for: NTPROBNP No results for input(s): PROBNP in the last 8760 hours. Recent Labs    04/03/19 0758 02/19/20 1334  TSH 1.24 0.440*    BMP Recent Labs    12/18/19 0309 12/19/19 0349 01/02/20 0955  NA 139 138 140  K 3.8 4.2 4.0  CL 104 102 102  CO2 25 26 25   GLUCOSE 169* 224* 115*  BUN 10 10 9   CREATININE 0.81 0.82 0.84  CALCIUM 8.4* 8.4* 9.1  GFRNONAA >60 >60 85  GFRAA >60 >60 98    HEMOGLOBIN A1C No results found for: HGBA1C, MPG  IMPRESSION:    ICD-10-CM   1. Tachycardia, paroxysmal (HCC)  I47.9 EKG 12-Lead    TSH    LONG TERM MONITOR (3-14 DAYS)  2. History of pneumonia due to COVID-19 virus  U07.1    J12.82   3. Precordial chest pain  R07.2 PCV ECHOCARDIOGRAM COMPLETE    PCV MYOCARDIAL PERFUSION WITH LEXISCAN  4. Mixed hyperlipidemia  E78.2   5. Benign hypertension  I10   6. Class 2 severe obesity due to excess calories with serious comorbidity and body mass index (BMI) of 38.0 to 38.9 in adult (HCC)  E66.01    Z68.38   7. Nonspecific abnormal electrocardiogram (ECG) (EKG)  R94.31 PCV MYOCARDIAL PERFUSION WITH LEXISCAN     RECOMMENDATIONS: Beth [name] is a 44 y.o. female whose past medical history and cardiac risk factors include: History of COVID-19 infection, hyperlipidemia, hypertension, obesity due to excess  calories.  Tachycardia, paroxysmal:  Patient states that she gets tachycardic with effort related activities. This may be a normal physiological response to increasing activity but it could also be secondary to deconditioning, thyroid disease, pulmonary condition due to recent ZOXWR-60 infection complicated by pneumonia.   7-day extended Holter monitor to evaluate for underlying dysrhythmias.  Check TSH  CTA chest June 21st 2021: Negative for pulmonary embolism.  Precordial chest pain with nonspecific abnormal EKG:  Patient's chest pain has both typical and atypical features.   EKG noted normal sinus rhythm without underlying injury pattern but concern for old anterior infarct.  Echocardiogram will  be ordered to evaluate for structural heart disease and left ventricular systolic function.  Nuclear stress test with the patient is unable to exercise as she currently is walking with a 4 wheeled walker.  CT chest with contrast dated 01/13/2020 reported no significant coronary artery calcification.  Mixed hyperlipidemia: Currently managed with team.  Obesity, due to excess calories: . Body mass index is 38.99 kg/m. . I reviewed with the patient the importance of diet, regular physical activity/exercise, weight loss.   . Patient is educated on increasing physical activity gradually as tolerated.  With the goal of moderate intensity exercise for 30 minutes a day 5 days a week.  FINAL MEDICATION LIST END OF ENCOUNTER: No orders of the defined types were placed in this encounter.    Current Outpatient Medications:  .  albuterol (PROAIR HFA) 108 (90 Base) MCG/ACT inhaler, Inhale 1 puff into the lungs every 4 (four) hours as needed for wheezing or shortness of breath., Disp: 6.7 g, Rfl: 0 .  atorvastatin (LIPITOR) 10 MG tablet, Take 10 mg by mouth daily. , Disp: , Rfl:  .  benzonatate (TESSALON) 200 MG capsule, Take 1 capsule (200 mg total) by mouth 3 (three) times daily as needed for  cough., Disp: 30 capsule, Rfl: 3 .  bromocriptine (PARLODEL) 2.5 MG tablet, Take 0.5 tablets (1.25 mg total) by mouth at bedtime., Disp: 45 tablet, Rfl: 3 .  chlorpheniramine-HYDROcodone (TUSSIONEX PENNKINETIC ER) 10-8 MG/5ML SUER, Take 5 mLs by mouth every 12 (twelve) hours as needed for cough., Disp: 120 mL, Rfl: 0 .  EPINEPHrine (EPIPEN 2-PAK) 0.3 mg/0.3 mL IJ SOAJ injection, Inject 0.3 mg into the muscle once. , Disp: , Rfl:  .  hydrochlorothiazide (HYDRODIURIL) 12.5 MG tablet, Take 1 tablet (12.5 mg total) by mouth daily., Disp: 90 tablet, Rfl: 1 .  levonorgestrel (MIRENA) 20 MCG/24HR IUD, 1 each by Intrauterine route once. , Disp: , Rfl:  .  mometasone-formoterol (DULERA) 100-5 MCG/ACT AERO, Inhale 2 puffs into the lungs daily. , Disp: , Rfl:  .  naftifine (NAFTIN) 1 % cream, Apply 1 application topically daily as needed (fungs infection). , Disp: , Rfl:  .  omega-3 fish oil (MAXEPA) 1000 MG CAPS capsule, Take 2 capsules by mouth daily. , Disp: , Rfl:  .  Pancrelipase, Lip-Prot-Amyl, (CREON) 24000-76000 units CPEP, Take 2 capsules by mouth See admin instructions. Take 2 capsules three times daily with food and 1 capsule twice daily with snacks., Disp: , Rfl:  .  pantoprazole (PROTONIX) 20 MG tablet, Take 20 mg by mouth daily., Disp: , Rfl:  .  tiZANidine (ZANAFLEX) 4 MG tablet, Take 1 tablet (4 mg total) by mouth every 8 (eight) hours as needed for muscle spasms., Disp: 90 tablet, Rfl: 1  Orders Placed This Encounter  Procedures  . TSH  . PCV MYOCARDIAL PERFUSION WITH LEXISCAN  . LONG TERM MONITOR (3-14 DAYS)  . EKG 12-Lead  . PCV ECHOCARDIOGRAM COMPLETE    There are no Patient Instructions on file for this visit.   --Continue cardiac medications as reconciled in final medication list. --Return in about 4 weeks (around 03/18/2020) for re-evaluation of tachycardia, chest pain. , review test results.. Or sooner if needed. --Continue follow-up with your primary care physician regarding  the management of your other chronic comorbid conditions.  Patient's questions and concerns were addressed to her satisfaction. She voices understanding of the instructions provided during this encounter.   This note was created using a voice recognition software as a result there  may be grammatical errors inadvertently enclosed that do not reflect the nature of this encounter. Every attempt is made to correct such errors.  Rex Kras, [place], [facility]  Pager: [phone] Office: [phone]

## 2020-02-20 ENCOUNTER — Other Ambulatory Visit (INDEPENDENT_AMBULATORY_CARE_PROVIDER_SITE_OTHER): Payer: Self-pay | Admitting: Primary Care

## 2020-02-20 DIAGNOSIS — R7989 Other specified abnormal findings of blood chemistry: Secondary | ICD-10-CM

## 2020-02-20 LAB — TSH: TSH: 0.44 u[IU]/mL — ABNORMAL LOW (ref 0.450–4.500)

## 2020-02-20 NOTE — Progress Notes (Signed)
Spoke with patient. Patient voiced understanding.

## 2020-02-22 ENCOUNTER — Other Ambulatory Visit (INDEPENDENT_AMBULATORY_CARE_PROVIDER_SITE_OTHER): Payer: Self-pay | Admitting: Primary Care

## 2020-02-22 DIAGNOSIS — R7989 Other specified abnormal findings of blood chemistry: Secondary | ICD-10-CM

## 2020-02-22 DIAGNOSIS — M549 Dorsalgia, unspecified: Secondary | ICD-10-CM

## 2020-02-24 ENCOUNTER — Other Ambulatory Visit: Payer: Self-pay

## 2020-02-24 ENCOUNTER — Ambulatory Visit (INDEPENDENT_AMBULATORY_CARE_PROVIDER_SITE_OTHER): Payer: BC Managed Care – PPO

## 2020-02-24 DIAGNOSIS — R7989 Other specified abnormal findings of blood chemistry: Secondary | ICD-10-CM

## 2020-02-25 LAB — LIPID PANEL
Chol/HDL Ratio: 5.4 ratio — ABNORMAL HIGH (ref 0.0–4.4)
Cholesterol, Total: 190 mg/dL (ref 100–199)
HDL: 35 mg/dL — ABNORMAL LOW (ref >39)
LDL Chol Calc (NIH): 127 mg/dL — ABNORMAL HIGH (ref 0–99)
Triglycerides: 158 mg/dL — ABNORMAL HIGH (ref 0–149)
VLDL Cholesterol Cal: 28 mg/dL (ref 5–40)

## 2020-02-25 LAB — T4, FREE: Free T4: 1.27 ng/dL (ref 0.82–1.77)

## 2020-02-26 ENCOUNTER — Other Ambulatory Visit (INDEPENDENT_AMBULATORY_CARE_PROVIDER_SITE_OTHER): Payer: Self-pay | Admitting: Primary Care

## 2020-02-26 MED ORDER — ATORVASTATIN CALCIUM 10 MG PO TABS
10.0000 mg | ORAL_TABLET | Freq: Every day | ORAL | 2 refills | Status: DC
Start: 2020-02-26 — End: 2021-06-15

## 2020-02-29 ENCOUNTER — Ambulatory Visit (HOSPITAL_BASED_OUTPATIENT_CLINIC_OR_DEPARTMENT_OTHER): Payer: BC Managed Care – PPO | Attending: Emergency Medicine | Admitting: Pulmonary Disease

## 2020-02-29 ENCOUNTER — Other Ambulatory Visit: Payer: Self-pay

## 2020-02-29 DIAGNOSIS — Z79899 Other long term (current) drug therapy: Secondary | ICD-10-CM | POA: Insufficient documentation

## 2020-02-29 DIAGNOSIS — R0683 Snoring: Secondary | ICD-10-CM

## 2020-02-29 DIAGNOSIS — G4733 Obstructive sleep apnea (adult) (pediatric): Secondary | ICD-10-CM | POA: Diagnosis not present

## 2020-03-02 ENCOUNTER — Other Ambulatory Visit: Payer: Self-pay

## 2020-03-02 DIAGNOSIS — R0683 Snoring: Secondary | ICD-10-CM

## 2020-03-02 NOTE — Procedures (Signed)
    Patient Name: Beth [name], Beth [name] Date: 02/29/2020 Gender: Female D.O.B: 03-Feb-1976 Age (years): 43 Referring Provider: Baltazar Apo Height (inches): 69 Interpreting Physician: Chesley Mires MD, ABSM Weight (lbs): 268 RPSGT: Gwenyth Allegra BMI: 40 MRN: [id number] Neck Size: 16.50  CLINICAL INFORMATION Sleep Study Type: NPSG  Indication for sleep study: Snoring  Epworth Sleepiness Score: 12  SLEEP STUDY TECHNIQUE As per the AASM Manual for the Scoring of Sleep and Associated Events v2.3 (April 2016) with a hypopnea requiring 4% desaturations.  The channels recorded and monitored were frontal, central and occipital EEG, electrooculogram (EOG), submentalis EMG (chin), nasal and oral airflow, thoracic and abdominal wall motion, anterior tibialis EMG, snore microphone, electrocardiogram, and pulse oximetry.  MEDICATIONS Medications self-administered by patient taken the night of the study : ATORVASTATIN, BROMOCRIPTINE  SLEEP ARCHITECTURE The study was initiated at 10:41:57 PM and ended at 4:59:36 AM.  Sleep onset time was 58.5 minutes and the sleep efficiency was 74.9%%. The total sleep time was 283 minutes.  Stage REM latency was 46.0 minutes.  The patient spent 8.8%% of the night in stage N1 sleep, 71.9%% in stage N2 sleep, 0.0%% in stage N3 and 19.3% in REM.  Alpha intrusion was absent.  Supine sleep was 0.00%.  RESPIRATORY PARAMETERS The overall apnea/hypopnea index (AHI) was 19.3 per hour. There were 48 total apneas, including 48 obstructive, 0 central and 0 mixed apneas. There were 43 hypopneas and 23 RERAs.  The AHI during Stage REM sleep was 76.0 per hour.  AHI while supine was N/A per hour.  The mean oxygen saturation was 94.8%. The minimum SpO2 during sleep was 80.0%.  loud snoring was noted during this study.  CARDIAC DATA The 2 lead EKG demonstrated sinus rhythm. The mean heart rate was 74.3 beats per minute. Other EKG findings include:  None.  LEG MOVEMENT DATA The total PLMS were 0 with a resulting PLMS index of 0.0. Associated arousal with leg movement index was 0.0 .  IMPRESSIONS - Moderate obstructive sleep apnea with an overall AHI of 19.3 and SpO2 low of 80%.  She had a significant REM effect to her sleep apnea. - The patient snored with loud snoring volume.  DIAGNOSIS - Obstructive Sleep Apnea (G47.33)  RECOMMENDATIONS - Additional therapies include weight loss, CPAP therapy, oral appliance, or surgical assessment.  [Electronically signed] 03/02/2020 01:03 PM  Chesley Mires MD, [place], American Board of Sleep Medicine   NPI: [id number]

## 2020-03-03 ENCOUNTER — Ambulatory Visit: Payer: BC Managed Care – PPO | Admitting: Family Medicine

## 2020-03-03 ENCOUNTER — Ambulatory Visit: Payer: BC Managed Care – PPO

## 2020-03-03 ENCOUNTER — Encounter: Payer: Self-pay | Admitting: Family Medicine

## 2020-03-03 ENCOUNTER — Ambulatory Visit: Payer: Self-pay

## 2020-03-03 DIAGNOSIS — G8929 Other chronic pain: Secondary | ICD-10-CM | POA: Diagnosis not present

## 2020-03-03 DIAGNOSIS — R072 Precordial pain: Secondary | ICD-10-CM | POA: Diagnosis not present

## 2020-03-03 DIAGNOSIS — R9431 Abnormal electrocardiogram [ECG] [EKG]: Secondary | ICD-10-CM

## 2020-03-03 DIAGNOSIS — M5441 Lumbago with sciatica, right side: Secondary | ICD-10-CM

## 2020-03-03 DIAGNOSIS — M5442 Lumbago with sciatica, left side: Secondary | ICD-10-CM | POA: Diagnosis not present

## 2020-03-03 MED ORDER — BACLOFEN 10 MG PO TABS
5.0000 mg | ORAL_TABLET | Freq: Three times a day (TID) | ORAL | 3 refills | Status: DC | PRN
Start: 2020-03-03 — End: 2023-06-29

## 2020-03-03 MED ORDER — NABUMETONE 500 MG PO TABS: 500.0000 mg | ORAL_TABLET | Freq: Two times a day (BID) | ORAL | 3 refills | Status: DC | PRN

## 2020-03-03 NOTE — Progress Notes (Signed)
Office Visit Note   Patient: Beth [name]           Date of Birth: June 05, 1976           MRN: [id number] Visit Date: 03/03/2020 Requested by: [name], NP [address] [place],  [place] [address] PCP: [name], NP  Subjective: Chief Complaint  Patient presents with  . Lower Back - Pain    Pain across lower back and down both legs. Worse in the morning. Started having problems with urinary incontinence a year ago. Cannot do leg lifts. Ambulating with rolling walker. S/p covid pneumonia - had started deep conditioning in home PT.    HPI: She is here with low back pain. Symptoms originally started about 5 or 6 years ago while living in [place]. She cannot recall whether she had an injury, but eventually she was diagnosed with a disc protrusion and started doing physical therapy. She was making good progress but then she moved here due to her work and stopped doing physical therapy. She continued doing her home exercises and was maintaining, but then this summer she developed COVID-19 pneumonia and was hospitalized. She was pretty much bedbound for a couple weeks and has had a lot of troubles with her back and legs since then. She has bilateral low back pain with radiation into both legs. She thinks she is having some urinary incontinence as well. She has trouble lifting her left hip. She is using a walker or a cane for support. She has done some home physical therapy recently, but it made her pain worse. She now presents for evaluation. Denies any fecal incontinence.              ROS:   All other systems were reviewed and are negative.  Objective: Vital Signs: There were no vitals taken for this visit.  Physical Exam:  General:  Alert and oriented, in no acute distress. Pulm:  Breathing unlabored. Psy:  Normal mood, congruent affect. Skin: No rash Low back: She is tender across the lumbar spine on both sides. She has some pain in the sciatic notch bilaterally.  Straight leg raise is negative. She has pain with internal hip rotation on the left. She has pain with hip flexion on the left, as well as a little bit of weakness. Remainder of lower extremity strength and reflexes are normal.  Imaging: XR Lumbar Spine 2-3 Views  Result Date: 03/03/2020 X-rays lumbar spine reveal cholecystectomy surgical clips. She has a grade 1 anterolisthesis of L4 on L5. She has mild to moderate degenerative disc disease at L5-S1. 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   Assessment & Plan: 1. Chronic low back pain, acutely worse in the past couple months with left hip flexion weakness and possible urinary incontinence -We will proceed with a new MRI scan. Prescription for Relafen and baclofen to take as needed. She has a physical therapy appointment scheduled later this month. Depending on MRI results, we might consider epidural injection.     Procedures: No procedures performed  No notes on file     PMFS History: Patient Active Problem List   Diagnosis Date Noted  . Snoring 02/05/2020  . Pneumonia due to COVID-19 virus 12/15/2019  . Acute respiratory failure with hypoxia ([place]) 12/15/2019  . Hypokalemia 12/15/2019  . Asthma without acute exacerbation 12/15/2019  . Chronic pancreatitis ([place])   . Hyperprolactinemia ([place]) 02/15/2018   Past Medical History:  Diagnosis Date  . Asthma   .  Chronic pancreatitis ([place])   . Hyperlipidemia   . Hypertension   . Hypoglycemia   . Obesity   . Prolactinoma ([place])     Family History  Problem Relation Age of Onset  . Other Paternal Aunt        pituitary surgery    Past Surgical History:  Procedure Laterality Date  . CHOLECYSTECTOMY    . COLONOSCOPY     Social History   Occupational History  . Not on file  Tobacco Use  . Smoking status: Never Smoker  . Smokeless tobacco: Never Used  Substance and Sexual Activity  . Alcohol use: Never  . Drug use: Never  . Sexual activity: Yes

## 2020-03-05 DIAGNOSIS — I479 Paroxysmal tachycardia, unspecified: Secondary | ICD-10-CM | POA: Diagnosis not present

## 2020-03-06 NOTE — Progress Notes (Signed)
Pt not tested for covid on Mon 03/08/20 due to pt testing + for covid on 12/15/19. Based on the guidelines the pt is in the 90 day window to not retest. The pt is still expected to quarantine until their procedure. Therefore, the pt can still have the scheduled procedure. These are the guidelines as follows:  Guidance: Patient previously tested + COVID; now past 90 day window seeking elective surgery (asymptomatic)  Retest patient If negative, proceed with surgery If positive, postpone surgery for 10 days from positive test Patient to quarantine for the (10 days) Do not retest again prior to surgery (even if scheduled a couple of weeks out) Use standard precautions for surgery

## 2020-03-08 ENCOUNTER — Inpatient Hospital Stay (HOSPITAL_COMMUNITY)
Admission: RE | Admit: 2020-03-08 | Discharge: 2020-03-08 | Disposition: A | Discharge status: Home / Self Care | Payer: BC Managed Care – PPO | Source: Ambulatory Visit

## 2020-03-11 ENCOUNTER — Encounter: Payer: Self-pay | Admitting: Emergency Medicine

## 2020-03-11 ENCOUNTER — Ambulatory Visit: Payer: BC Managed Care – PPO | Admitting: Emergency Medicine

## 2020-03-11 ENCOUNTER — Other Ambulatory Visit: Payer: Self-pay

## 2020-03-11 VITALS — BP 120/86 | HR 79 | Temp 97.3°F | Ht 69.0 in | Wt 265.2 lb

## 2020-03-11 DIAGNOSIS — J45909 Unspecified asthma, uncomplicated: Secondary | ICD-10-CM | POA: Diagnosis not present

## 2020-03-11 DIAGNOSIS — J1282 Pneumonia due to coronavirus disease 2019: Secondary | ICD-10-CM

## 2020-03-11 DIAGNOSIS — G4733 Obstructive sleep apnea (adult) (pediatric): Secondary | ICD-10-CM | POA: Insufficient documentation

## 2020-03-11 DIAGNOSIS — U071 COVID-19: Secondary | ICD-10-CM | POA: Diagnosis not present

## 2020-03-11 NOTE — Patient Instructions (Signed)
We will not restart Dulera at this time Keep your albuterol available to use 2 puffs when you need if shortness of breath, chest tightness, wheezing. We will schedule your pulmonary function testing so that we can review next time We will repeat a chest x-ray at your next office visit to follow for any chronic changes from your COVID-19 pneumonia Your sleep study showed obstructive sleep apnea.  We will order CPAP for you. Follow-up with Dr. Lamonte Sakai in 1 to 2 months so that we can talk about how you have done on the CPAP

## 2020-03-11 NOTE — Progress Notes (Signed)
° °  Subjective:    Patient ID: Beth [name], female    DOB: June 24, 1976, 44 y.o.   MRN: [id number]  HPI 44 year old woman, never smoker, with a history of chronic pancreatitis, childhood asthma that has become active again as an adult when she was pregnant in 2000, seen initially in June 2021 when she was admitted with COVID-19 pneumonia.  Note was made on a CT angio chest 12/15/2019 that she had no evidence for PE but there was marked severe bilateral diffuse infiltrates consistent with a viral pneumonia.  Also mention was 4.4 x 3.1 cm heterogeneous soft tissue mass along the right paratracheal region. Has remained on Dulera, rarely uses albuterol. She snores. Not well-rested in the am, she takes naps. She has fallen asleep accidentally before, evan at a stoplight.   She is significantly better since discharge, still using O2. Has some dyspnea with a lot of speaking, with steady exertion. Has not really increased her activity yet.   CT chest 01/13/2020 reviewed by me, shows large-scale resolution of her bilateral infiltrates with residual peripheral scar and no further groundglass.  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  ROV 03/11/20 --follow-up visit for patient with a history of asthma, never smoker.  She had COVID-19 pneumonia in 11/2019.  I met her a month ago and we did a trial of stopping her Dulera to see if she would miss it.  I had also hoped to get pulmonary function testing but she actually cannot do this until she is 90 days out from her Covid diagnosis. She tolerated stopping Dulera, has not needed any albuterol. Her cough is much better.  She has had snoring and daytime sleepiness so we performed PSG as below.  PSG from 02/29/2020, data reviewed, shows an AHI 19.3/h without any central apneas.  43 hypopneas.  All worse during REM sleep.  No abnormal limb movements.  She was not titrated on CPAP.   Review of  Systems As per HPI      Objective:   Physical Exam Vitals:   03/11/20 1112  BP: 120/86  Pulse: 79  Temp: (!) 97.3 F (36.3 C)  TempSrc: Temporal  SpO2: 98%  Weight: 265 lb 3.2 oz (120.3 kg)  Height: _0  (1.753 m)   Gen: Pleasant, well-nourished, in no distress,  normal affect  ENT: No lesions,  mouth clear,  oropharynx clear, no postnasal drip  Neck: No JVD, no stridor  Lungs: No use of accessory muscles, no wheeze or crackles.   Cardiovascular: RRR, heart sounds normal, no murmur or gallops, no peripheral edema  Musculoskeletal: No deformities, no cyanosis or clubbing  Neuro: alert, awake, non focal  Skin: Warm, no lesions or rash      Assessment & Plan:  Asthma without acute exacerbation Possible asthma.  She seemed to tolerate stopping the [facility].  We had plan for PFT but she is not yet 90 days post her Covid diagnosis.  At that time she will be able to get the PFT done.  Keep albuterol available to use as needed  Obstructive sleep apnea AHI 19.3.  She is willing to try CPAP.  We will arrange auto-set device for her, try nasal pillows  Pneumonia due to COVID-19 virus Repeat chest x-ray next time to follow her infiltrates, interstitial changes post Covid.   Baltazar Apo, MD, PhD 03/11/2020, 5:30 PM [facility] [phone] or if no answer [phone]

## 2020-03-11 NOTE — Addendum Note (Signed)
Addended by: Lia Foyer R on: 03/11/2020 05:39 PM   Modules accepted: Orders

## 2020-03-11 NOTE — Assessment & Plan Note (Signed)
Possible asthma.  She seemed to tolerate stopping the [facility].  We had plan for PFT but she is not yet 90 days post her Covid diagnosis.  At that time she will be able to get the PFT done.  Keep albuterol available to use as needed

## 2020-03-11 NOTE — Assessment & Plan Note (Signed)
AHI 19.3.  She is willing to try CPAP.  We will arrange auto-set device for her, try nasal pillows

## 2020-03-11 NOTE — Assessment & Plan Note (Signed)
Repeat chest x-ray next time to follow her infiltrates, interstitial changes post Covid.

## 2020-03-12 ENCOUNTER — Other Ambulatory Visit: Payer: BC Managed Care – PPO

## 2020-03-12 ENCOUNTER — Ambulatory Visit: Payer: BC Managed Care – PPO

## 2020-03-12 DIAGNOSIS — R072 Precordial pain: Secondary | ICD-10-CM | POA: Diagnosis not present

## 2020-03-17 ENCOUNTER — Other Ambulatory Visit: Payer: Self-pay

## 2020-03-17 ENCOUNTER — Ambulatory Visit: Payer: BC Managed Care – PPO | Attending: Primary Care

## 2020-03-17 DIAGNOSIS — M62830 Muscle spasm of back: Secondary | ICD-10-CM | POA: Diagnosis not present

## 2020-03-17 DIAGNOSIS — R262 Difficulty in walking, not elsewhere classified: Secondary | ICD-10-CM | POA: Diagnosis not present

## 2020-03-17 DIAGNOSIS — R2689 Other abnormalities of gait and mobility: Secondary | ICD-10-CM

## 2020-03-17 DIAGNOSIS — M4310 Spondylolisthesis, site unspecified: Secondary | ICD-10-CM | POA: Insufficient documentation

## 2020-03-17 DIAGNOSIS — M5137 Other intervertebral disc degeneration, lumbosacral region: Secondary | ICD-10-CM | POA: Diagnosis not present

## 2020-03-17 DIAGNOSIS — M6281 Muscle weakness (generalized): Secondary | ICD-10-CM | POA: Insufficient documentation

## 2020-03-17 DIAGNOSIS — M545 Low back pain, unspecified: Secondary | ICD-10-CM

## 2020-03-17 DIAGNOSIS — R293 Abnormal posture: Secondary | ICD-10-CM

## 2020-03-18 NOTE — Therapy (Signed)
[place], [place], [address] Phone: [phone]   Fax:  [phone]  Physical Therapy Evaluation  Patient Details  Name: Beth [name] MRN: [id number] Date of Birth: 22-Aug-1975 Referring Provider (PT): [name], NP   Encounter Date: 03/17/2020   PT End of Session - 03/18/20 0817    Visit Number 1    Number of Visits 13    Date for PT Re-Evaluation 05/08/20    Authorization Type [employer] COMM PPO    Progress Note Due on Visit 10    PT Start Time 1837    PT Stop Time 1945    PT Time Calculation (min) 68 min    Activity Tolerance Patient tolerated treatment well    Behavior During Therapy [facility] for tasks assessed/performed           Past Medical History:  Diagnosis Date  . Asthma   . Chronic pancreatitis ([place])   . Hyperlipidemia   . Hypertension   . Hypoglycemia   . Obesity   . Prolactinoma [facility])     Past Surgical History:  Procedure Laterality Date  . CHOLECYSTECTOMY    . COLONOSCOPY      There were no vitals filed for this visit.    Subjective Assessment - 03/17/20 1859    Subjective Pt reports her low back started hurting following a HHPT session with resisted hip abd in [place]. Pt was receiving HHPT due to deconitioning related to Covid which she acquired around the end of June. Pt reports bladder imcontinence with the development of covid prior to her back issue. Additionally, she reports weakness of the L hip related to weakness and tightness of her hip joint.    Pertinent History Back issue 2012 received PT and was told the issue appeared to be disc related. Covid, 6/21; obesity    Limitations Sitting;Lifting;Standing;Walking;House hold activities    How long can you sit comfortably? 30-45 mins    How long can you stand comfortably? 5 mins    How long can you walk comfortably? 10-15 mins    Diagnostic tests MRI scheduled for 10/2. X-rays 9/8: X-rays lumbar spine reveal  cholecystectomy surgical clips. She has a grade 1 anterolisthesis of L4 on L5. She has mild to moderate degenerative disc disease at L5-S1. 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    Patient Stated Goals To be active with less pain s medications    Currently in Pain? Yes    Pain Score 1    0-10/10   Pain Location Back    Pain Orientation Right;Left;Posterior    Pain Descriptors / Indicators Aching;Tightness;Spasm;Sharp    Pain Type Chronic pain    Pain Onset More than a month ago    Pain Frequency Intermittent    Aggravating Factors  Standing and walking    Pain Relieving Factors Meds, good posture, back support, heat, ice, biofreeze,    Effect of Pain on Daily Activities Significant impact on activity level              [facility] PT Assessment - 03/18/20 0001      Assessment   Medical Diagnosis Acute low back pain; anterolisthesis; DDD    Referring Provider (PT) [name], NP    Onset Date/Surgical Date --   approx 2 months   Prior Therapy HHPT      Precautions   Precautions None      Restrictions   Weight Bearing Restrictions No  Balance Screen   Has the patient fallen in the past 6 months Yes    How many times? 1    Has the patient had a decrease in activity level because of a fear of falling?  Yes    Is the patient reluctant to leave their home because of a fear of falling?  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    Leisure Dance and travel       Cognition   Overall Cognitive Status Within Functional Limits for tasks assessed      Observation/Other Assessments   Focus on Therapeutic  Outcomes (FOTO)  75% limitation      Sensation   Light Touch Appears Intact      Posture/Postural Control   Posture/Postural Control Postural limitations    Postural Limitations Forward head;Increased lumbar lordosis   forward flexed trunk     ROM / Strength   AROM / PROM / Strength AROM;Strength      AROM   AROM Assessment Site Lumbar    Lumbar Flexion Full    discomfort upon returning to being upright   Lumbar Extension 75% limitation   provoked L low back pain, pinch/pressure   Lumbar - Right Side Bend 25% limitation   pulling discomfort L   Lumbar - Left Side Bend 50% limitation   provoked L low back pain, pinch/pressure   Lumbar - Right Rotation 25% limitation    Lumbar - Left Rotation 50% limitation   provoked L low back pain, pinch/pressure     Strength   Overall Strength Comments Weakness vs. tightness L hip flexion      Palpation   Palpation comment TTP to the lumbar paraspinals with increased muscle tightness      Special Tests    Special Tests Lumbar    Lumbar Tests Slump Test      Transfers   Transfers Sit to Stand;Stand to Sit    Sit to Stand 6: Modified independent (Device/Increase time)   labored     Ambulation/Gait   Ambulation/Gait Yes    Ambulation/Gait Assistance 6: Modified independent (Device/Increase time)    Gait Pattern Step-through pattern;Decreased stride length   decreased pace                     Objective measurements completed on examination: See above findings.               PT Education - 03/18/20 0816    Education Details Eval findings, POC, HEP, measures to reduceand manage pain.    Person(s) Educated Patient    Methods Explanation;Demonstration;Tactile cues;Verbal cues;Handout    Comprehension Verbalized understanding;Returned demonstration;Verbal cues required;Tactile cues required;Need further instruction            PT Short Term Goals - 03/18/20 0959      PT SHORT TERM GOAL #1   Title Pt wll be ind  in a final HEP    Baseline started on eval    Status New  Target Date 04/08/20      PT SHORT TERM GOAL #2   Title Pt will voice understanding of measures to assist in pain reduction and management.    Status New    Target Date 04/08/20             PT Long Term Goals - 03/18/20 1330      PT LONG TERM GOAL #1   Title Improved trunk ROM to at least 75% of normal for all movements    Baseline See flowsheets    Status New    Target Date 05/01/20      PT LONG TERM GOAL #2   Title Pt's will report improved ability to complete usual work and daily activities to a little difficulty    Baseline Extreme difficulty    Status New    Target Date 05/01/20      PT LONG TERM GOAL #3   Title Pt will report improved ability to stand from a chair to no difficulty    Baseline moderate difficulty    Status New    Target Date 05/01/20      PT LONG TERM GOAL #4   Title pt's FOTO survey score will improved to the predicted value of 52% limitation    Baseline 74% limitation    Period Weeks    Status New    Target Date 05/01/20      PT LONG TERM GOAL #5   Title Pt will be ind in a final HEP    Status New    Target Date 05/01/20                  Plan - 03/18/20 0927    Clinical Impression Statement Pt presents with acute low back which is primarily provoked c extension, L SB, L rotation. Xrays revealed anterolisthesis L4-L5, DDD L5-S1, early DJD B hips. Pt reports incontience of bladder which started with acquiring covid and L hip weakness and tightness with hip flexion. Pt responded posiively to [facility], posterior pelvic abdominal strengthening, and stretching of the hip flexors. For community mobility, pt uses a 4WRW related to deconditioning related to covid and her back issues. Pt will benefit from PT 2w6 for ROM, strengthening, modalities as indicated, and posture and body mechanic education to reduce pain and improve functional mobility.    Personal Factors and Comorbidities  Comorbidity 2;Fitness    Comorbidities Deconditioing related to covid; obesity    Examination-Activity Limitations Bed Mobility;Continence;Lift;Stand;Stairs;Squat;Sleep;Sit;Locomotion Level    Stability/Clinical Decision Making Evolving/Moderate complexity    Clinical Decision Making Moderate    Rehab Potential Good    PT Frequency 2x / week    PT Duration 6 weeks    PT Treatment/Interventions ADLs/Self Care Home Management;Cryotherapy;Electrical Stimulation;Ultrasound;Traction;Moist Heat;Iontophoresis 4mg /ml Dexamethasone;Gait training;Stair training;Functional mobility training;Therapeutic activities;Therapeutic exercise;Balance training;Neuromuscular re-education;Manual techniques;Patient/family education;Dry needling;Energy conservation;Taping;Vasopneumatic Device    PT Next Visit Plan Assess response to HEP; progress ther ex/HEP and use modalities as indicated; complete 2MWT and set goal    PT Home Exercise Plan [id number]    Consulted and Agree with Plan of Care Patient           Patient will benefit from skilled therapeutic intervention in order to improve the following deficits and impairments:  Abnormal gait, Decreased range of motion, Difficulty walking, Decreased endurance, Increased muscle spasms, Obesity, Decreased activity tolerance, Pain, Decreased balance, Decreased mobility, Decreased strength, Postural dysfunction  Visit Diagnosis: Acute midline low back pain without sciatica  Muscle weakness (generalized)  Anterolisthesis  Abnormal posture  Difficulty in walking, not elsewhere classified  Other abnormalities of gait and mobility  Muscle spasm of back  DDD (degenerative disc disease), lumbosacral     Problem List Patient Active Problem List   Diagnosis Date Noted  . Obstructive sleep apnea 03/11/2020  . Snoring 02/05/2020  . Pneumonia due to COVID-19 virus 12/15/2019  . Acute respiratory failure with hypoxia ([place]) 12/15/2019  . Hypokalemia 12/15/2019  .  Asthma without acute exacerbation 12/15/2019  . Chronic pancreatitis ([place])   . Hyperprolactinemia ([place]) 02/15/2018    Gar Ponto MS, PT 03/18/20 1:46 PM  [place], [place], [address] Phone: [phone]   Fax:  [phone]  Name: Beth [name] MRN: [id number] Date of Birth: Dec 05, 1975

## 2020-03-19 ENCOUNTER — Other Ambulatory Visit (HOSPITAL_COMMUNITY)
Admission: RE | Admit: 2020-03-19 | Discharge: 2020-03-19 | Disposition: A | Discharge status: Home / Self Care | Payer: BC Managed Care – PPO | Source: Ambulatory Visit | Attending: Emergency Medicine | Admitting: Emergency Medicine

## 2020-03-19 DIAGNOSIS — Z01812 Encounter for preprocedural laboratory examination: Secondary | ICD-10-CM | POA: Diagnosis not present

## 2020-03-19 DIAGNOSIS — Z20822 Contact with and (suspected) exposure to covid-19: Secondary | ICD-10-CM | POA: Diagnosis not present

## 2020-03-19 LAB — SARS CORONAVIRUS 2 (TAT 6-24 HRS): SARS Coronavirus 2: NEGATIVE

## 2020-03-22 ENCOUNTER — Ambulatory Visit (INDEPENDENT_AMBULATORY_CARE_PROVIDER_SITE_OTHER): Payer: BC Managed Care – PPO | Admitting: Emergency Medicine

## 2020-03-22 ENCOUNTER — Other Ambulatory Visit: Payer: Self-pay

## 2020-03-22 DIAGNOSIS — J1282 Pneumonia due to coronavirus disease 2019: Secondary | ICD-10-CM | POA: Diagnosis not present

## 2020-03-22 DIAGNOSIS — U071 COVID-19: Secondary | ICD-10-CM

## 2020-03-22 LAB — PULMONARY FUNCTION TEST
DL/VA % pred: 123 %
DL/VA: 5.25 ml/min/mmHg/L
DLCO cor % pred: 78 %
DLCO cor: 19.9 ml/min/mmHg
DLCO unc % pred: 80 %
DLCO unc: 20.6 ml/min/mmHg
FEF 25-75 Post: 5.19 L/sec
FEF 25-75 Pre: 4.04 L/sec
FEF2575-%Change-Post: 28 %
FEF2575-%Pred-Post: 168 %
FEF2575-%Pred-Pre: 131 %
FEV1-%Change-Post: 8 %
FEV1-%Pred-Post: 87 %
FEV1-%Pred-Pre: 80 %
FEV1-Post: 2.57 L
FEV1-Pre: 2.37 L
FEV1FVC-%Change-Post: 4 %
FEV1FVC-%Pred-Pre: 106 %
FEV6-%Change-Post: 5 %
FEV6-%Pred-Post: 78 %
FEV6-%Pred-Pre: 74 %
FEV6-Post: 2.78 L
FEV6-Pre: 2.63 L
FEV6FVC-%Pred-Post: 102 %
FEV6FVC-%Pred-Pre: 102 %
FVC-%Change-Post: 3 %
FVC-%Pred-Post: 76 %
FVC-%Pred-Pre: 74 %
FVC-Post: 2.78 L
FVC-Pre: 2.68 L
Post FEV1/FVC ratio: 93 %
Post FEV6/FVC ratio: 100 %
Pre FEV1/FVC ratio: 89 %
Pre FEV6/FVC Ratio: 100 %
RV % pred: 70 %
RV: 1.33 L
TLC % pred: 76 %
TLC: 4.42 L

## 2020-03-22 NOTE — Progress Notes (Signed)
Full PFT performed today. °

## 2020-03-23 DIAGNOSIS — I479 Paroxysmal tachycardia, unspecified: Secondary | ICD-10-CM | POA: Diagnosis not present

## 2020-03-27 ENCOUNTER — Other Ambulatory Visit: Payer: Self-pay

## 2020-03-27 ENCOUNTER — Ambulatory Visit
Admission: RE | Admit: 2020-03-27 | Discharge: 2020-03-27 | Disposition: A | Discharge status: Home / Self Care | Payer: BC Managed Care – PPO | Source: Ambulatory Visit | Attending: Family Medicine | Admitting: Family Medicine

## 2020-03-27 DIAGNOSIS — M5442 Lumbago with sciatica, left side: Secondary | ICD-10-CM

## 2020-03-27 DIAGNOSIS — M48061 Spinal stenosis, lumbar region without neurogenic claudication: Secondary | ICD-10-CM | POA: Diagnosis not present

## 2020-03-27 DIAGNOSIS — G8929 Other chronic pain: Secondary | ICD-10-CM

## 2020-03-29 ENCOUNTER — Telehealth: Payer: Self-pay | Admitting: Family Medicine

## 2020-03-29 DIAGNOSIS — N281 Cyst of kidney, acquired: Secondary | ICD-10-CM

## 2020-03-29 NOTE — Telephone Encounter (Signed)
Lumbar MRI does not show any ruptured discs or pinched nerves.  There is some mild arthritis in the facet joints on the back of the spine.  No indication for surgery.

## 2020-03-31 DIAGNOSIS — U099 Post covid-19 condition, unspecified: Secondary | ICD-10-CM

## 2020-04-01 ENCOUNTER — Ambulatory Visit: Payer: BC Managed Care – PPO | Admitting: Cardiology

## 2020-04-01 ENCOUNTER — Other Ambulatory Visit: Payer: Self-pay

## 2020-04-01 ENCOUNTER — Encounter: Payer: Self-pay | Admitting: Cardiology

## 2020-04-01 VITALS — BP 122/64 | HR 72 | Ht 69.0 in | Wt 264.0 lb

## 2020-04-01 DIAGNOSIS — U071 COVID-19: Secondary | ICD-10-CM | POA: Diagnosis not present

## 2020-04-01 DIAGNOSIS — M1990 Unspecified osteoarthritis, unspecified site: Secondary | ICD-10-CM | POA: Insufficient documentation

## 2020-04-01 DIAGNOSIS — M5430 Sciatica, unspecified side: Secondary | ICD-10-CM | POA: Insufficient documentation

## 2020-04-01 DIAGNOSIS — Z712 Person consulting for explanation of examination or test findings: Secondary | ICD-10-CM | POA: Diagnosis not present

## 2020-04-01 DIAGNOSIS — I10 Essential (primary) hypertension: Secondary | ICD-10-CM

## 2020-04-01 DIAGNOSIS — E782 Mixed hyperlipidemia: Secondary | ICD-10-CM

## 2020-04-01 DIAGNOSIS — I479 Paroxysmal tachycardia, unspecified: Secondary | ICD-10-CM

## 2020-04-01 DIAGNOSIS — R072 Precordial pain: Secondary | ICD-10-CM | POA: Diagnosis not present

## 2020-04-01 NOTE — Progress Notes (Signed)
ID:  Beth [name], DOB [birth year], MRN [id number]  PCP:  [name], NP  Cardiologist:  Rex Kras, DO, [facility] (established care 02/19/2020)  Date: 04/01/20 Last Office Visit: 02/19/2020  Chief Complaint  Patient presents with  . Chest Pain  . Results    HPI  Beth [name] is a 44 y.o. female who presents to the office with a chief complaint of "reevaluation of chest pain and review test results." Patient's past medical history and cardiovascular risk factors include: History of COVID-19 infection, hyperlipidemia, hypertension, obesity due to excess calories.  Patient is referred to the office at the request of her primary care provider for evaluation of tachycardia with effort related activities.   She was diagnosed with COVID-19 infection back in June 2021 with subsequent sequelae of pneumonia and hypoxia.  She was later discharged with home health care which noted that her pulse would be elevated despite normal pulse ox.  Her heart rate would range between 120-130 bpm.and therefore she was referred to cardiology for further evaluation and management.  Since last office visit patient states that she underwent a PFT and as well as a sleep study.  She has underlying obstructive sleep apnea which is currently being addressed.  I will check her TSH which was low and I have asked her to follow-up with her PCP for further management and she is currently in the process.  She also underwent a 7-day extended Holter monitor which noted her average heart rate of 83 bpm without any significant dysrhythmias.  She also underwent an echocardiogram which noted preserved left ventricular systolic function and normal diastolic filling pattern without any significant valvular heart disease.  And stress test was overall a low risk study.  Clinically patient is doing well from a cardiovascular standpoint.  She appears more healthy compared to last office visit.  She states that she no longer has  chest pain or discomfort.  And her shortness of breath is significantly better.  No family history of premature coronary disease or sudden cardiac death.  FUNCTIONAL STATUS: No structured exercise program or daily routine.   ALLERGIES: Allergies  Allergen Reactions  . Wirt Swelling  . [place] Grass Hives  . Cedar Swelling  . Cladosporium Cladosporioides Hives  . Dust Mite Extract Hives  . Elm Bark [Ulmus Fulva] Swelling  . Lidocaine Hcl Swelling  . Molds & Smuts Hives  . Mugwort Hives  . Nettle [Urtica Dioica] Hives  . Sorrel-Dock Mix [Sheep Sorrel-Yellow Dock] Hives  . Timothy Grass Pollen Allergen Hives  . Xylocaine [Lidocaine] Swelling    MEDICATION LIST PRIOR TO VISIT: Current Meds  Medication Sig  . albuterol (PROAIR HFA) 108 (90 Base) MCG/ACT inhaler Inhale 1 puff into the lungs every 4 (four) hours as needed for wheezing or shortness of breath.  Marland Kitchen atorvastatin (LIPITOR) 10 MG tablet Take 1 tablet (10 mg total) by mouth daily.  . baclofen (LIORESAL) 10 MG tablet Take 0.5-1 tablets (5-10 mg total) by mouth 3 (three) times daily as needed for muscle spasms.  . benzonatate (TESSALON) 200 MG capsule Take 1 capsule (200 mg total) by mouth 3 (three) times daily as needed for cough.  . bromocriptine (PARLODEL) 2.5 MG tablet Take 0.5 tablets (1.25 mg total) by mouth at bedtime.  . chlorpheniramine-HYDROcodone (TUSSIONEX PENNKINETIC ER) 10-8 MG/5ML SUER Take 5 mLs by mouth every 12 (twelve) hours as needed for cough.  . EPINEPHrine (EPIPEN 2-PAK) 0.3 mg/0.3 mL IJ SOAJ injection Inject 0.3 mg into the muscle once.   Marland Kitchen  hydrochlorothiazide (HYDRODIURIL) 12.5 MG tablet Take 1 tablet (12.5 mg total) by mouth daily.  Marland Kitchen levonorgestrel (MIRENA) 20 MCG/24HR IUD 1 each by Intrauterine route once.   . nabumetone (RELAFEN) 500 MG tablet Take 1 tablet (500 mg total) by mouth 2 (two) times daily as needed.  . naftifine (NAFTIN) 1 % cream Apply 1 application topically daily as needed (fungs  infection).   Marland Kitchen omega-3 fish oil (MAXEPA) 1000 MG CAPS capsule Take 2 capsules by mouth daily.   . Pancrelipase, Lip-Prot-Amyl, (CREON) 24000-76000 units CPEP Take 2 capsules by mouth See admin instructions. Take 2 capsules three times daily with food and 1 capsule twice daily with snacks.  . pantoprazole (PROTONIX) 20 MG tablet Take 20 mg by mouth daily.  Marland Kitchen tiZANidine (ZANAFLEX) 4 MG tablet Take 1 tablet (4 mg total) by mouth every 8 (eight) hours as needed for muscle spasms.  . Zinc Sulfate (ZINC 15 PO) Take 1 tablet by mouth daily.     PAST MEDICAL HISTORY: Past Medical History:  Diagnosis Date  . Asthma   . Chronic pancreatitis ([place])   . DDD (degenerative disc disease), lumbosacral   . DJD (degenerative joint disease)   . Hyperlipidemia   . Hypertension   . Hypoglycemia   . Obesity   . Osteoarthritis   . Prolactinoma ([place])   . Sciatica     PAST SURGICAL HISTORY: Past Surgical History:  Procedure Laterality Date  . CHOLECYSTECTOMY    . COLONOSCOPY      FAMILY HISTORY: The patient family history includes Arthritis in her mother; Asthma in her mother; Bell's palsy in her father; Colon cancer in her mother; Diabetes in her father and mother; Hypertension in her father and mother; Other in her paternal aunt; Thyroid disease in her mother.  SOCIAL HISTORY:  The patient  reports that she has never smoked. She has never used smokeless tobacco. She reports that she does not drink alcohol and does not use drugs.  REVIEW OF SYSTEMS: Review of Systems  Constitutional: Negative for chills and fever.  HENT: Negative for hoarse voice and nosebleeds.   Eyes: Negative for discharge, double vision and pain.  Cardiovascular: Negative for chest pain, claudication, dyspnea on exertion, leg swelling, near-syncope, orthopnea, palpitations, paroxysmal nocturnal dyspnea and syncope.  Respiratory: Positive for shortness of breath (improving). Negative for hemoptysis.   Musculoskeletal: Negative  for muscle cramps and myalgias.  Gastrointestinal: Negative for abdominal pain, constipation, diarrhea, hematemesis, hematochezia, melena, nausea and vomiting.  Neurological: Negative for dizziness and light-headedness.    PHYSICAL EXAM: Vitals with BMI 04/01/2020 03/11/2020 02/29/2020  Height 5\' 9"  5\' 9"  5\' 9"   Weight 264 lbs 265 lbs 3 oz 268 lbs  BMI 38.97 99.35 70.17  Systolic 793 903 -  Diastolic 64 86 -  Pulse 72 79 -    CONSTITUTIONAL: Appears older than stated age, ambulates with cane or 4 wheel walker, hemodynamically stable, no acute distress.   SKIN: Skin is warm and dry. No rash noted. No cyanosis. No pallor. No jaundice HEAD: Normocephalic and atraumatic.  EYES: No scleral icterus MOUTH/THROAT: Moist oral membranes.  NECK: No JVD present. No thyromegaly noted. No carotid bruits  LYMPHATIC: No visible cervical adenopathy.  CHEST Normal respiratory effort. No intercostal retractions  LUNGS: Clear to auscultation bilaterally. no stridor. No wheezes. No rales.  CARDIOVASCULAR: Regular rate and rhythm, positive S1-S2, no murmurs rubs or gallops appreciated. ABDOMINAL: Obese, soft, nontender, nondistended, positive bowel sounds all 4 quadrants. No apparent ascites.  EXTREMITIES: No peripheral  edema  HEMATOLOGIC: No significant bruising NEUROLOGIC: Oriented to person, place, and time. Nonfocal. Normal muscle tone.  PSYCHIATRIC: Normal mood and affect. Normal behavior. Cooperative  CARDIAC DATABASE: EKG: 02/19/2020: Sinus  Rhythm, 79bpm, PRWP, consider old anterior infarct, low voltage, nonspecific T-abnormality.  Echocardiogram: 11/29/2009 performed at [facility]: Patient was provided the records. LVEF greater than 55%.  Trace MR, trace TR, trileaflet aortic valve, trace PR, no pericardial effusion.  03/12/2020:  Left ventricle cavity is normal in size and wall thickness. Normal global wall motion. Normal LV systolic function with EF 68%. Normal diastolic filling  pattern.  Mild (Grade I) mitral regurgitation.  Mild tricuspid regurgitation.  No evidence of pulmonary hypertension.   Stress Testing: Lexiscan Sestamibi stress test 03/03/2020:  Lexiscan nuclear stress test performed using 1-day protocol.  Normal myocardial perfusion. Stress LVEF 78%.  Low risk study.   Heart Catheterization: None  7 day extended Holter monitor:  Dominant rhythm normal sinus.  Heart rate 52-139 bpm. Average heart rate 83 bpm.  No atrial fibrillation, supraventricular tachycardia, ventricular tachycardia, high grade AV block, sinus pause greater than or equal to 3 seconds in duration.  Total ventricular ectopic burden <1%.  Total supraventricular ectopic burden <1%.  Patient triggered events: 1. Underlying rhythm sinus tachycardia without dysrhythmias.   LABORATORY DATA: CBC Latest Ref Rng & Units 01/02/2020 12/19/2019 12/18/2019  WBC 3.4 - 10.8 x10E3/uL 7.4 14.4(H) 16.1(H)  Hemoglobin 11.1 - 15.9 g/dL 12.2 10.7(L) 10.5(L)  Hematocrit 34.0 - 46.6 % 37.4 31.9(L) 31.1(L)  Platelets 150 - 450 x10E3/uL 228 480(H) 445(H)    CMP Latest Ref Rng & Units 01/02/2020 12/19/2019 12/18/2019  Glucose 65 - 99 mg/dL 115(H) 224(H) 169(H)  BUN 6 - 24 mg/dL 9 10 10   Creatinine 0.57 - 1.00 mg/dL 0.84 0.82 0.81  Sodium 134 - 144 mmol/L 140 138 139  Potassium 3.5 - 5.2 mmol/L 4.0 4.2 3.8  Chloride 96 - 106 mmol/L 102 102 104  CO2 20 - 29 mmol/L 25 26 25   Calcium 8.7 - 10.2 mg/dL 9.1 8.4(L) 8.4(L)  Total Protein 6.0 - 8.5 g/dL 6.0 5.7(L) 5.9(L)  Total Bilirubin 0.0 - 1.2 mg/dL 0.2 0.5 0.5  Alkaline Phos 48 - 121 IU/L 94 60 64  AST 0 - 40 IU/L 15 20 19   ALT 0 - 32 IU/L 30 27 26     Lipid Panel     Component Value Date/Time   CHOL 190 02/24/2020 1405   TRIG 158 (H) 02/24/2020 1405   HDL 35 (L) 02/24/2020 1405   CHOLHDL 5.4 (H) 02/24/2020 1405   LDLCALC 127 (H) 02/24/2020 1405   LABVLDL 28 02/24/2020 1405    No components found for: NTPROBNP No results for input(s): PROBNP in  the last 8760 hours. Recent Labs    04/03/19 0758 02/19/20 1334  TSH 1.24 0.440*    BMP Recent Labs    12/18/19 0309 12/19/19 0349 01/02/20 0955  NA 139 138 140  K 3.8 4.2 4.0  CL 104 102 102  CO2 25 26 25   GLUCOSE 169* 224* 115*  BUN 10 10 9   CREATININE 0.81 0.82 0.84  CALCIUM 8.4* 8.4* 9.1  GFRNONAA >60 >60 85  GFRAA >60 >60 98    HEMOGLOBIN A1C No results found for: HGBA1C, MPG  IMPRESSION:    ICD-10-CM   1. Tachycardia, paroxysmal (HCC)  I47.9   2. Encounter to discuss test results  Z71.2   3. History of pneumonia due to COVID-19 virus  U07.1  J12.82   4. Precordial chest pain  R07.2   5. Mixed hyperlipidemia  E78.2   6. Benign hypertension  I10   7. Class 2 severe obesity due to excess calories with serious comorbidity and body mass index (BMI) of 38.0 to 38.9 in adult [facility])  E66.01    Z68.38      RECOMMENDATIONS: Beth [name] is a 44 y.o. female whose past medical history and cardiac risk factors include: History of COVID-19 infection, hyperlipidemia, hypertension, obesity due to excess calories.  Tachycardia, paroxysmal: Resolved  Since last office visit patient is no longer tachycardic.  Clinically she has improved significantly.    7 day extended Holter monitor noted no significant underlying dysrhythmias.  Echocardiogram and nuclear stress test results were also reviewed with the patient at today's visit.  She is following up with her PCP for her underlying thyroid work-up.  She has undergone a PFT and sleep study and has been diagnosed with sleep apnea.  Precordial chest pain: Resolved.  Since last visit patient did undergo an ischemic evaluation including an echocardiogram and stress test both of the results were reviewed with her at today's office visit.    Mixed hyperlipidemia: Currently managed with team.  Obesity, due to excess calories: Body mass index is 38.99 kg/m. . I reviewed with the patient the importance of diet, regular  physical activity/exercise, weight loss.   . Patient is educated on increasing physical activity gradually as tolerated.  With the goal of moderate intensity exercise for 30 minutes a day 5 days a week.  I would recommend that she follow-up in 1 year for reevaluation of symptoms or on as needed basis.  FINAL MEDICATION LIST END OF ENCOUNTER: No orders of the defined types were placed in this encounter.    Current Outpatient Medications:  .  albuterol (PROAIR HFA) 108 (90 Base) MCG/ACT inhaler, Inhale 1 puff into the lungs every 4 (four) hours as needed for wheezing or shortness of breath., Disp: 6.7 g, Rfl: 0 .  atorvastatin (LIPITOR) 10 MG tablet, Take 1 tablet (10 mg total) by mouth daily., Disp: 90 tablet, Rfl: 2 .  baclofen (LIORESAL) 10 MG tablet, Take 0.5-1 tablets (5-10 mg total) by mouth 3 (three) times daily as needed for muscle spasms., Disp: 30 each, Rfl: 3 .  benzonatate (TESSALON) 200 MG capsule, Take 1 capsule (200 mg total) by mouth 3 (three) times daily as needed for cough., Disp: 30 capsule, Rfl: 3 .  bromocriptine (PARLODEL) 2.5 MG tablet, Take 0.5 tablets (1.25 mg total) by mouth at bedtime., Disp: 45 tablet, Rfl: 3 .  chlorpheniramine-HYDROcodone (TUSSIONEX PENNKINETIC ER) 10-8 MG/5ML SUER, Take 5 mLs by mouth every 12 (twelve) hours as needed for cough., Disp: 120 mL, Rfl: 0 .  EPINEPHrine (EPIPEN 2-PAK) 0.3 mg/0.3 mL IJ SOAJ injection, Inject 0.3 mg into the muscle once. , Disp: , Rfl:  .  hydrochlorothiazide (HYDRODIURIL) 12.5 MG tablet, Take 1 tablet (12.5 mg total) by mouth daily., Disp: 90 tablet, Rfl: 1 .  levonorgestrel (MIRENA) 20 MCG/24HR IUD, 1 each by Intrauterine route once. , Disp: , Rfl:  .  nabumetone (RELAFEN) 500 MG tablet, Take 1 tablet (500 mg total) by mouth 2 (two) times daily as needed., Disp: 60 tablet, Rfl: 3 .  naftifine (NAFTIN) 1 % cream, Apply 1 application topically daily as needed (fungs infection). , Disp: , Rfl:  .  omega-3 fish oil (MAXEPA)  1000 MG CAPS capsule, Take 2 capsules by mouth daily. , Disp: , Rfl:  .  Pancrelipase, Lip-Prot-Amyl, (CREON) 24000-76000 units CPEP, Take 2 capsules by mouth See admin instructions. Take 2 capsules three times daily with food and 1 capsule twice daily with snacks., Disp: , Rfl:  .  pantoprazole (PROTONIX) 20 MG tablet, Take 20 mg by mouth daily., Disp: , Rfl:  .  tiZANidine (ZANAFLEX) 4 MG tablet, Take 1 tablet (4 mg total) by mouth every 8 (eight) hours as needed for muscle spasms., Disp: 90 tablet, Rfl: 1 .  Zinc Sulfate (ZINC 15 PO), Take 1 tablet by mouth daily., Disp: , Rfl:   No orders of the defined types were placed in this encounter.  There are no Patient Instructions on file for this visit.   --Continue cardiac medications as reconciled in final medication list. --Return in about 1 year (around 04/01/2021) for Reevaluation of symptoms.. Or sooner if needed. --Continue follow-up with your primary care physician regarding the management of your other chronic comorbid conditions.  Patient's questions and concerns were addressed to her satisfaction. She voices understanding of the instructions provided during this encounter.   This note was created using a voice recognition software as a result there may be grammatical errors inadvertently enclosed that do not reflect the nature of this encounter. Every attempt is made to correct such errors.  Total time spent: 20 minutes.  Rex Kras, [place], [facility]  Pager: [phone] Office: [phone]

## 2020-04-01 NOTE — Telephone Encounter (Signed)
Dr. Byrum - please advise. Thanks. 

## 2020-04-01 NOTE — Addendum Note (Signed)
Addended by: Hortencia Pilar on: 04/01/2020 08:37 AM   Modules accepted: Orders

## 2020-04-02 ENCOUNTER — Encounter (INDEPENDENT_AMBULATORY_CARE_PROVIDER_SITE_OTHER): Payer: Self-pay | Admitting: Primary Care

## 2020-04-02 NOTE — Telephone Encounter (Signed)
Routing message to Estill Bamberg since she is RB's nurse to ensure that letter is typed and signed by RB when he returns to clinic.

## 2020-04-02 NOTE — Telephone Encounter (Signed)
I'm [place] writing a letter stating that pt has asthma and is at higher risk for complications if she were to contract COVID. Will sign it when I'm back in office

## 2020-04-05 ENCOUNTER — Ambulatory Visit: Payer: BC Managed Care – PPO

## 2020-04-05 ENCOUNTER — Encounter: Payer: Self-pay | Admitting: Emergency Medicine

## 2020-04-05 NOTE — Telephone Encounter (Signed)
Letter printed and signed and place behind front desk. Nothing further needed at this time.

## 2020-04-06 ENCOUNTER — Ambulatory Visit (INDEPENDENT_AMBULATORY_CARE_PROVIDER_SITE_OTHER): Payer: BC Managed Care – PPO

## 2020-04-06 DIAGNOSIS — R918 Other nonspecific abnormal finding of lung field: Secondary | ICD-10-CM | POA: Diagnosis not present

## 2020-04-06 DIAGNOSIS — U099 Post covid-19 condition, unspecified: Secondary | ICD-10-CM

## 2020-04-06 NOTE — Telephone Encounter (Signed)
RB please advise on pt's email, and advise on recs  picked up my letter from the front desk, but it doesn't say anything about when or if I can take the covid vaccine or about needing to get up from my desk every hour or so as needed.  Especially since we'll be required to wear a mask all day.  Please let me know when a revised letter is available for pick up.

## 2020-04-08 ENCOUNTER — Ambulatory Visit: Payer: BC Managed Care – PPO | Admitting: Endocrinology

## 2020-04-14 DIAGNOSIS — Z20822 Contact with and (suspected) exposure to covid-19: Secondary | ICD-10-CM | POA: Diagnosis not present

## 2020-04-14 DIAGNOSIS — Z03818 Encounter for observation for suspected exposure to other biological agents ruled out: Secondary | ICD-10-CM | POA: Diagnosis not present

## 2020-04-14 NOTE — Telephone Encounter (Signed)
I'm still not sure what she wants added to a letter. I can try to write a new one but she needs to clarify what information the job needs.

## 2020-04-15 ENCOUNTER — Telehealth: Payer: Self-pay | Admitting: Emergency Medicine

## 2020-04-15 ENCOUNTER — Encounter: Payer: Self-pay | Admitting: Emergency Medicine

## 2020-04-15 ENCOUNTER — Ambulatory Visit
Admission: RE | Admit: 2020-04-15 | Discharge: 2020-04-15 | Disposition: A | Discharge status: Home / Self Care | Payer: BC Managed Care – PPO | Source: Ambulatory Visit | Attending: Family Medicine | Admitting: Family Medicine

## 2020-04-15 ENCOUNTER — Ambulatory Visit: Payer: BC Managed Care – PPO | Admitting: Physical Therapy

## 2020-04-15 DIAGNOSIS — N281 Cyst of kidney, acquired: Secondary | ICD-10-CM | POA: Diagnosis not present

## 2020-04-15 NOTE — Telephone Encounter (Signed)
Called the pt to get clarification on the letter that she is requesting. LMTCB. Will email her back and let her know we tried calling and that further clarification is needed.

## 2020-04-15 NOTE — Telephone Encounter (Signed)
Spoke with pt and informed her letter from Dr. Lamonte Sakai was ready for pick up in the office and gave pt Dr. Agustina Caroli recommendation for Covid vaccine. Pt stated understanding. Nothing further needed at this time.

## 2020-04-15 NOTE — Telephone Encounter (Signed)
  It has been 90 days, so it is ok for her to get her vaccine now. I would recommend either Pfizer or Moderna  I do agree with writing a letter that states that she would benefit from getting to move around every 1-2 hours. Written and given to [facility]

## 2020-04-15 NOTE — Telephone Encounter (Signed)
Spoke with patient who states that her job has a new policy that they either get fully vaccinated, get tested weekly or have to have proof as to why they can't get vaccinated. Patient was Covid positive in June and admitted to the hospital on 6/21.   1. Patient is asking if you think she is ok to get vaccinated at this time and if you have a preference of which one she should get?   2. She states that you and her have discussed that she needs to get up from her desk every hour to move around due to her shortness of breath. She recently found out that she has degenerative disc disease and bilateral sciatica and wanted to let you know and see if we can provide her a letter stating that she needs to get up.   Beth [name] please advise

## 2020-04-16 ENCOUNTER — Telehealth: Payer: Self-pay | Admitting: Family Medicine

## 2020-04-16 NOTE — Telephone Encounter (Signed)
The cyst in the right kidney is small and appears harmless on ultrasound.  No further evaluation or treatment needed.

## 2020-04-17 ENCOUNTER — Other Ambulatory Visit: Payer: Self-pay

## 2020-04-17 ENCOUNTER — Ambulatory Visit: Payer: BC Managed Care – PPO | Attending: Primary Care

## 2020-04-17 DIAGNOSIS — R262 Difficulty in walking, not elsewhere classified: Secondary | ICD-10-CM

## 2020-04-17 DIAGNOSIS — M5137 Other intervertebral disc degeneration, lumbosacral region: Secondary | ICD-10-CM

## 2020-04-17 DIAGNOSIS — R2689 Other abnormalities of gait and mobility: Secondary | ICD-10-CM | POA: Diagnosis not present

## 2020-04-17 DIAGNOSIS — M545 Low back pain, unspecified: Secondary | ICD-10-CM

## 2020-04-17 DIAGNOSIS — R293 Abnormal posture: Secondary | ICD-10-CM | POA: Diagnosis not present

## 2020-04-17 DIAGNOSIS — M62830 Muscle spasm of back: Secondary | ICD-10-CM

## 2020-04-17 DIAGNOSIS — M6281 Muscle weakness (generalized): Secondary | ICD-10-CM | POA: Insufficient documentation

## 2020-04-17 DIAGNOSIS — M4310 Spondylolisthesis, site unspecified: Secondary | ICD-10-CM | POA: Diagnosis not present

## 2020-04-18 NOTE — Therapy (Signed)
[place], [place], [address] Phone: [phone]   Fax:  [phone]  Physical Therapy Treatment  Patient Details  Name: Beth [name] MRN: [id number] Date of Birth: 21-Jun-1976 Referring Provider (PT): [name], NP   Encounter Date: 04/17/2020   PT End of Session - 04/17/20 1011    Visit Number 2    Number of Visits 13    Date for PT Re-Evaluation 05/08/20    Authorization Type BCBS COMM PPO    PT Start Time 0951    PT Stop Time 1034    PT Time Calculation (min) 43 min    Equipment Utilized During Treatment Other (comment)   [id number]   Activity Tolerance Patient tolerated treatment well    Behavior During Therapy [facility] for tasks assessed/performed           Past Medical History:  Diagnosis Date  . Asthma   . Chronic pancreatitis ([place])   . DDD (degenerative disc disease), lumbosacral   . DJD (degenerative joint disease)   . Hyperlipidemia   . Hypertension   . Hypoglycemia   . Obesity   . Osteoarthritis   . Prolactinoma ([place])   . Sciatica     Past Surgical History:  Procedure Laterality Date  . CHOLECYSTECTOMY    . COLONOSCOPY      There were no vitals filed for this visit.   Subjective Assessment - 04/18/20 1148    Subjective Pt reports she is improving slowlt regarding pain and improved hip flexion ROM. She reports 6/10 LBP when she woke up this AM and it is now currently a 1/10. Pt notes she uses a 4wrw in the community and a SPC at home as needed.    Pertinent History Back issue 2012 received PT and was told the issue appeared to be disc related. Covid, 6/21; obesity    Diagnostic tests MRI scheduled for 10/2. X-rays 9/8: X-rays lumbar spine reveal cholecystectomy surgical clips. She has a grade 1 anterolisthesis of L4 on L5. She has mild to moderate degenerative disc disease at L5-S1. 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    Patient Stated Goals To be active with less pain  s medications    Currently in Pain? Yes    Pain Score 1     Pain Location Back    Pain Orientation Posterior;Lower    Pain Descriptors / Indicators Aching;Tightness;Spasm    Pain Type Chronic pain    Pain Onset More than a month ago    Pain Frequency Intermittent    Aggravating Factors  Satnding, bending, turning    Pain Relieving Factors Meds, good posture, back support, heat, ice, biofreeze, hot shower    Effect of Pain on Daily Activities Significant impact on activity level                             [facility] Adult PT Treatment/Exercise - 04/18/20 0001      Exercises   Exercises Lumbar      Lumbar Exercises: Stretches   Active Hamstring Stretch Right;Left;2 reps;20 seconds    Active Hamstring Stretch Limitations seated    Hip Flexor Stretch Right;Left;2 reps;20 seconds    Hip Flexor Stretch Limitations for quad as well; standing 2 chair; abs engagaed and don't extended low back      Lumbar Exercises: Supine   Pelvic Tilt 10 reps    Pelvic Tilt Limitations 3 sec  Other Supine Lumbar Exercises Mini-crunch reach to ankles,10x, 3 sec, arm beats up/down                  PT Education - 04/18/20 1139    Education Details HEP    Person(s) Educated Patient    Methods Explanation;Demonstration;Tactile cues;Verbal cues;Handout    Comprehension Verbalized understanding;Returned demonstration;Verbal cues required;Tactile cues required            PT Short Term Goals - 03/18/20 0959      PT SHORT TERM GOAL #1   Title Pt wll be ind in a final HEP    Baseline started on eval    Status New    Target Date 04/08/20      PT SHORT TERM GOAL #2   Title Pt will voice understanding of measures to assist in pain reduction and management.    Status New    Target Date 04/08/20             PT Long Term Goals - 03/18/20 1330      PT LONG TERM GOAL #1   Title Improved trunk ROM to at least 75% of normal for all movements    Baseline See flowsheets     Status New    Target Date 05/01/20      PT LONG TERM GOAL #2   Title Pt's will report improved ability to complete usual work and daily activities to a little difficulty    Baseline Extreme difficulty    Status New    Target Date 05/01/20      PT LONG TERM GOAL #3   Title Pt will report improved ability to stand from a chair to no difficulty    Baseline moderate difficulty    Status New    Target Date 05/01/20      PT LONG TERM GOAL #4   Title pt's FOTO survey score will improved to the predicted value of 52% limitation    Baseline 74% limitation    Period Weeks    Status New    Target Date 05/01/20      PT LONG TERM GOAL #5   Title Pt will be ind in a final HEP    Status New    Target Date 05/01/20                 Plan - 04/18/20 1131    Clinical Impression Statement Pt returns to PT 1 month after the initial eval with overall pain level, function, and flexion ROM of the L hip being improved. PT focused on low back/hamstring/hip flexor/quad flexibility; and abdominal strengthening to assist c stability and decompression of the low back.    Personal Factors and Comorbidities Comorbidity 2;Fitness    Comorbidities Deconditioing related to covid; obesity    Examination-Activity Limitations Bed Mobility;Continence;Lift;Stand;Stairs;Squat;Sleep;Sit;Locomotion Level    Stability/Clinical Decision Making Evolving/Moderate complexity    Clinical Decision Making Moderate    Rehab Potential Good    PT Frequency 2x / week    PT Duration 6 weeks    PT Treatment/Interventions ADLs/Self Care Home Management;Cryotherapy;Electrical Stimulation;Ultrasound;Traction;Moist Heat;Iontophoresis 4mg /ml Dexamethasone;Gait training;Stair training;Functional mobility training;Therapeutic activities;Therapeutic exercise;Balance training;Neuromuscular re-education;Manual techniques;Patient/family education;Dry needling;Energy conservation;Taping;Vasopneumatic Device    PT Next Visit Plan Initiate  back strengthening exs to continue to address stability of the low back.    PT Home Exercise Plan [id number]    Consulted and Agree with Plan of Care Patient           Patient will  benefit from skilled therapeutic intervention in order to improve the following deficits and impairments:  Abnormal gait, Decreased range of motion, Difficulty walking, Decreased endurance, Increased muscle spasms, Obesity, Decreased activity tolerance, Pain, Decreased balance, Decreased mobility, Decreased strength, Postural dysfunction  Visit Diagnosis: Acute midline low back pain without sciatica  Muscle weakness (generalized)  Anterolisthesis  Abnormal posture  Difficulty in walking, not elsewhere classified  Other abnormalities of gait and mobility  Muscle spasm of back  DDD (degenerative disc disease), lumbosacral     Problem List Patient Active Problem List   Diagnosis Date Noted  . DJD (degenerative joint disease)   . Sciatica   . Osteoarthritis   . Obstructive sleep apnea 03/11/2020  . Snoring 02/05/2020  . Pneumonia due to COVID-19 virus 12/15/2019  . Acute respiratory failure with hypoxia ([place]) 12/15/2019  . Hypokalemia 12/15/2019  . Asthma without acute exacerbation 12/15/2019  . Chronic pancreatitis ([place])   . Hyperprolactinemia ([place]) 02/15/2018    Gar Ponto MS, PT 04/18/20 12:02 PM  [facility] [address] [place], [place], [address] Phone: [phone]   Fax:  [phone]  Name: Beth [name] MRN: [id number] Date of Birth: 10-Mar-1976

## 2020-04-19 ENCOUNTER — Other Ambulatory Visit: Payer: Self-pay | Admitting: Endocrinology

## 2020-04-20 ENCOUNTER — Encounter: Payer: Self-pay | Admitting: Physical Therapy

## 2020-04-20 ENCOUNTER — Ambulatory Visit: Payer: BC Managed Care – PPO | Admitting: Physical Therapy

## 2020-04-20 ENCOUNTER — Other Ambulatory Visit: Payer: Self-pay

## 2020-04-20 DIAGNOSIS — M6281 Muscle weakness (generalized): Secondary | ICD-10-CM

## 2020-04-20 DIAGNOSIS — R293 Abnormal posture: Secondary | ICD-10-CM | POA: Diagnosis not present

## 2020-04-20 DIAGNOSIS — M545 Low back pain, unspecified: Secondary | ICD-10-CM

## 2020-04-20 DIAGNOSIS — R2689 Other abnormalities of gait and mobility: Secondary | ICD-10-CM | POA: Diagnosis not present

## 2020-04-20 DIAGNOSIS — R262 Difficulty in walking, not elsewhere classified: Secondary | ICD-10-CM | POA: Diagnosis not present

## 2020-04-20 DIAGNOSIS — M62830 Muscle spasm of back: Secondary | ICD-10-CM | POA: Diagnosis not present

## 2020-04-20 DIAGNOSIS — M4310 Spondylolisthesis, site unspecified: Secondary | ICD-10-CM | POA: Diagnosis not present

## 2020-04-20 DIAGNOSIS — M5137 Other intervertebral disc degeneration, lumbosacral region: Secondary | ICD-10-CM | POA: Diagnosis not present

## 2020-04-20 NOTE — Therapy (Signed)
Beth [name], [place], [address] Phone: [phone]   Fax:  [phone]  Physical Therapy Treatment  Patient Details  Name: Beth [name] MRN: [id number] Date of Birth: [birth year] Referring Provider (PT): [name], NP   Encounter Date: 04/20/2020   PT End of Session - 04/20/20 1847    Visit Number 3    Number of Visits 13    Date for PT Re-Evaluation 05/08/20    Authorization Type BCBS COMM PPO    Progress Note Due on Visit 10    PT Start Time 1845    PT Stop Time 1929    PT Time Calculation (min) 44 min    Activity Tolerance Patient tolerated treatment well    Behavior During Therapy [facility] for tasks assessed/performed           Past Medical History:  Diagnosis Date  . Asthma   . Chronic pancreatitis ([place])   . DDD (degenerative disc disease), lumbosacral   . DJD (degenerative joint disease)   . Hyperlipidemia   . Hypertension   . Hypoglycemia   . Obesity   . Osteoarthritis   . Prolactinoma ([place])   . Sciatica     Past Surgical History:  Procedure Laterality Date  . CHOLECYSTECTOMY    . COLONOSCOPY      There were no vitals filed for this visit.   Subjective Assessment - 04/20/20 1847    Subjective Today was the first day back in the office after working at home. Lumbar support in my chair helped a lot. I was a little spasm-y after treatment on Saturday but I was able to get up and move around. I went to the mall with my daughter- walked 4117 steps.    Patient Stated Goals To be active with less pain s medications    Currently in Pain? Yes    Pain Score 2     Pain Location Back    Pain Orientation Lower              [facility] PT Assessment - 04/20/20 0001      Posture/Postural Control   Posture Comments flexed at hips resulting in increased lumbar lordosis for upright posture                         [facility] Adult PT Treatment/Exercise - 04/20/20 0001       Lumbar Exercises: Stretches   Lower Trunk Rotation 5 reps    Other Lumbar Stretch Exercise standing hip flexor stretch- stopped due to incr back pain      Lumbar Exercises: Aerobic   Nustep 5 min L5 UE & LE      Lumbar Exercises: Seated   Other Seated Lumbar Exercises seated pelvic tilt- added hip hinge      Lumbar Exercises: Supine   AB Set Limitations with posterior pelvic tilt, cues for breathing      Lumbar Exercises: Quadruped   Other Quadruped Lumbar Exercises rocking, with ab set                  PT Education - 04/20/20 2057    Education Details work Personal assistant, 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    Status New    Target Date 04/08/20             PT Long Term Goals - 03/18/20 1330      PT LONG TERM GOAL #1   Title Improved trunk ROM to at least 75% of normal for all movements    Baseline See flowsheets    Status New    Target Date 05/01/20      PT LONG TERM GOAL #2   Title Pt's will report improved ability to complete usual work and daily activities to a little difficulty    Baseline Extreme difficulty    Status New    Target Date 05/01/20      PT LONG TERM GOAL #3   Title Pt will report improved ability to stand from a chair to no difficulty    Baseline moderate difficulty    Status New    Target Date 05/01/20      PT LONG TERM GOAL #4   Title pt's FOTO survey score will improved to the predicted value of 52% limitation    Baseline 74% limitation    Period Weeks    Status New    Target Date 05/01/20       PT LONG TERM GOAL #5   Title Pt will be ind in a final HEP    Status New    Target Date 05/01/20                 Plan - 04/20/20 2052    Clinical Impression Statement Lacking full hip extension for upright posture resulting in spasm of bil QL and increased compression in lumber spine. Discussed the importance of workign through her posture to achieve proper posture. Asked her to avoid resistance exercise and weight lifting until her posture is stacked.    PT Treatment/Interventions ADLs/Self Care Home Management;Cryotherapy;Electrical Stimulation;Ultrasound;Traction;Moist Heat;Iontophoresis 4mg /ml Dexamethasone;Gait training;Stair training;Functional mobility training;Therapeutic activities;Therapeutic exercise;Balance training;Neuromuscular re-education;Manual techniques;Patient/family education;Dry needling;Energy conservation;Taping;Vasopneumatic Device    PT Next Visit Plan core activation, hip flexor stretching, glut activation    PT Home Exercise Plan [id number]    Consulted and Agree with Plan of Care Patient           Patient will benefit from skilled therapeutic intervention in order to improve the following deficits and impairments:  Abnormal gait, Decreased range of motion, Difficulty walking, Decreased endurance, Increased muscle spasms, Obesity, Decreased activity tolerance, Pain, Decreased balance, Decreased mobility, Decreased strength, Postural dysfunction  Visit Diagnosis: Acute midline low back pain without sciatica  Muscle weakness (generalized)     Problem List Patient Active Problem List   Diagnosis Date Noted  . DJD (degenerative joint disease)   . Sciatica   . Osteoarthritis   . Obstructive sleep apnea 03/11/2020  . Snoring 02/05/2020  . Pneumonia due to COVID-19 virus 12/15/2019  . Acute respiratory failure with hypoxia ([place]) 12/15/2019  . Hypokalemia 12/15/2019  . Asthma without acute exacerbation 12/15/2019  . Chronic pancreatitis ([place])   .  Hyperprolactinemia ([place]) 02/15/2018    Beth [name] 04/20/20 8:59 PM   [facility]  [place], [place], [address] Phone: [phone]   Fax:  [phone]  Name: Beth [name] MRN: [id number] Date of Birth: 03-17-1976

## 2020-04-24 ENCOUNTER — Ambulatory Visit: Payer: BC Managed Care – PPO

## 2020-04-24 ENCOUNTER — Other Ambulatory Visit: Payer: Self-pay

## 2020-04-24 DIAGNOSIS — R2689 Other abnormalities of gait and mobility: Secondary | ICD-10-CM | POA: Diagnosis not present

## 2020-04-24 DIAGNOSIS — M6281 Muscle weakness (generalized): Secondary | ICD-10-CM | POA: Diagnosis not present

## 2020-04-24 DIAGNOSIS — R293 Abnormal posture: Secondary | ICD-10-CM | POA: Diagnosis not present

## 2020-04-24 DIAGNOSIS — M5137 Other intervertebral disc degeneration, lumbosacral region: Secondary | ICD-10-CM | POA: Diagnosis not present

## 2020-04-24 DIAGNOSIS — R262 Difficulty in walking, not elsewhere classified: Secondary | ICD-10-CM

## 2020-04-24 DIAGNOSIS — M62830 Muscle spasm of back: Secondary | ICD-10-CM | POA: Diagnosis not present

## 2020-04-24 DIAGNOSIS — M545 Low back pain, unspecified: Secondary | ICD-10-CM | POA: Diagnosis not present

## 2020-04-24 DIAGNOSIS — M4310 Spondylolisthesis, site unspecified: Secondary | ICD-10-CM | POA: Diagnosis not present

## 2020-04-24 DIAGNOSIS — Z20822 Contact with and (suspected) exposure to covid-19: Secondary | ICD-10-CM | POA: Diagnosis not present

## 2020-04-24 NOTE — Therapy (Signed)
[place], [place], [address] Phone: [phone]   Fax:  [phone]  Physical Therapy Treatment  Patient Details  Name: Beth [name] MRN: [id number] Date of Birth: 1975/11/13 Referring Provider (PT): [name], NP   Encounter Date: 04/24/2020   PT End of Session - 04/24/20 2023    Visit Number 4    Number of Visits 13    Date for PT Re-Evaluation 05/08/20    Authorization Type BCBS COMM PPO    Progress Note Due on Visit 10    PT Start Time 1201    PT Stop Time 1300    PT Time Calculation (min) 59 min    Equipment Utilized During Treatment Other (comment)   [id number]   Activity Tolerance Patient tolerated treatment well    Behavior During Therapy [facility] for tasks assessed/performed           Past Medical History:  Diagnosis Date  . Asthma   . Chronic pancreatitis ([place])   . DDD (degenerative disc disease), lumbosacral   . DJD (degenerative joint disease)   . Hyperlipidemia   . Hypertension   . Hypoglycemia   . Obesity   . Osteoarthritis   . Prolactinoma ([place])   . Sciatica     Past Surgical History:  Procedure Laterality Date  . CHOLECYSTECTOMY    . COLONOSCOPY      There were no vitals filed for this visit.   Subjective Assessment - 04/24/20 1211    Subjective Pt report she is overall improving gradually. She reports learning her limts with activity and how to manage returning to work with taking standing break from sitting.    Currently in Pain? Yes    Pain Score 1     Pain Location Back    Pain Orientation Lower    Pain Descriptors / Indicators Aching;Tightness;Spasm    Pain Type Chronic pain    Pain Onset More than a month ago    Pain Frequency Intermittent                             [facility] Adult PT Treatment/Exercise - 04/24/20 0001      Lumbar Exercises: Stretches   Hip Flexor Stretch Right;Left;2 reps;20 seconds    Hip Flexor Stretch  Limitations Standing; also c warrior pose hip add stretch      Lumbar Exercises: Standing   Row Both;15 reps    Theraband Level (Row) Level 2 (Red)    Row Limitations 2 sets    Shoulder Extension Both;15 reps    Theraband Level (Shoulder Extension) Level 2 (Red)    Shoulder Extension Limitations 2 sets    Other Standing Lumbar Exercises partial knee bends at sink; 10x                  PT Education - 04/24/20 2019    Education Details Proper computer set up for to reduce back and nech starin. HEP updated for back and LE strengthening Ed, for proper body mechanics for household activties    Person(s) Educated Patient    Methods Explanation;Demonstration;Tactile cues;Verbal cues;Handout    Comprehension Verbalized understanding;Returned demonstration;Verbal cues required;Tactile cues required;Need further instruction            PT Short Term Goals - 03/18/20 0959      PT SHORT TERM GOAL #1   Title Pt wll be ind in a final HEP  Baseline started on eval    Status New    Target Date 04/08/20      PT SHORT TERM GOAL #2   Title Pt will voice understanding of measures to assist in pain reduction and management.    Status New    Target Date 04/08/20             PT Long Term Goals - 03/18/20 1330      PT LONG TERM GOAL #1   Title Improved trunk ROM to at least 75% of normal for all movements    Baseline See flowsheets    Status New    Target Date 05/01/20      PT LONG TERM GOAL #2   Title Pt's will report improved ability to complete usual work and daily activities to a little difficulty    Baseline Extreme difficulty    Status New    Target Date 05/01/20      PT LONG TERM GOAL #3   Title Pt will report improved ability to stand from a chair to no difficulty    Baseline moderate difficulty    Status New    Target Date 05/01/20      PT LONG TERM GOAL #4   Title pt's FOTO survey score will improved to the predicted value of 52% limitation    Baseline 74%  limitation    Period Weeks    Status New    Target Date 05/01/20      PT LONG TERM GOAL #5   Title Pt will be ind in a final HEP    Status New    Target Date 05/01/20                 Plan - 04/24/20 2024    Clinical Impression Statement Ed. for computer set, proper body mechanics for household activites, and strengthening exs for back and LEs. Pt completed core activation prior to all exs today.Pt is improving gradually re: pain and function.    Personal Factors and Comorbidities Comorbidity 2;Fitness    Comorbidities Deconditioing related to covid; obesity    Examination-Activity Limitations Bed Mobility;Continence;Lift;Stand;Stairs;Squat;Sleep;Sit;Locomotion Level    Stability/Clinical Decision Making Evolving/Moderate complexity    Clinical Decision Making Moderate    Rehab Potential Good    PT Frequency 2x / week    PT Duration 6 weeks    PT Treatment/Interventions ADLs/Self Care Home Management;Cryotherapy;Electrical Stimulation;Ultrasound;Traction;Moist Heat;Iontophoresis 4mg /ml Dexamethasone;Gait training;Stair training;Functional mobility training;Therapeutic activities;Therapeutic exercise;Balance training;Neuromuscular re-education;Manual techniques;Patient/family education;Dry needling;Energy conservation;Taping;Vasopneumatic Device    PT Next Visit Plan Progress core strengthening as tolerated    PT Home Exercise Plan [id number]    Consulted and Agree with Plan of Care Patient           Patient will benefit from skilled therapeutic intervention in order to improve the following deficits and impairments:  Abnormal gait, Decreased range of motion, Difficulty walking, Decreased endurance, Increased muscle spasms, Obesity, Decreased activity tolerance, Pain, Decreased balance, Decreased mobility, Decreased strength, Postural dysfunction  Visit Diagnosis: Acute midline low back pain without sciatica  Muscle weakness (generalized)  Anterolisthesis  Abnormal  posture  Difficulty in walking, not elsewhere classified  Other abnormalities of gait and mobility  Muscle spasm of back  DDD (degenerative disc disease), lumbosacral     Problem List Patient Active Problem List   Diagnosis Date Noted  . DJD (degenerative joint disease)   . Sciatica   . Osteoarthritis   . Obstructive sleep apnea 03/11/2020  . Snoring 02/05/2020  .  Pneumonia due to COVID-19 virus 12/15/2019  . Acute respiratory failure with hypoxia ([place]) 12/15/2019  . Hypokalemia 12/15/2019  . Asthma without acute exacerbation 12/15/2019  . Chronic pancreatitis ([place])   . Hyperprolactinemia ([place]) 02/15/2018    Gar Ponto MS, PT 04/24/20 8:44 PM  [facility] [address] [place], [place], [address] Phone: [phone]   Fax:  [phone]  Name: Beth [name] MRN: [id number] Date of Birth: 07/28/1975

## 2020-04-27 ENCOUNTER — Other Ambulatory Visit: Payer: Self-pay

## 2020-04-27 ENCOUNTER — Ambulatory Visit: Payer: BC Managed Care – PPO | Attending: Primary Care

## 2020-04-27 DIAGNOSIS — M5137 Other intervertebral disc degeneration, lumbosacral region: Secondary | ICD-10-CM | POA: Diagnosis not present

## 2020-04-27 DIAGNOSIS — M62830 Muscle spasm of back: Secondary | ICD-10-CM | POA: Insufficient documentation

## 2020-04-27 DIAGNOSIS — R293 Abnormal posture: Secondary | ICD-10-CM | POA: Diagnosis not present

## 2020-04-27 DIAGNOSIS — R2689 Other abnormalities of gait and mobility: Secondary | ICD-10-CM | POA: Diagnosis not present

## 2020-04-27 DIAGNOSIS — M545 Low back pain, unspecified: Secondary | ICD-10-CM | POA: Diagnosis not present

## 2020-04-27 DIAGNOSIS — M4310 Spondylolisthesis, site unspecified: Secondary | ICD-10-CM | POA: Diagnosis not present

## 2020-04-27 DIAGNOSIS — R262 Difficulty in walking, not elsewhere classified: Secondary | ICD-10-CM | POA: Diagnosis not present

## 2020-04-27 DIAGNOSIS — M6281 Muscle weakness (generalized): Secondary | ICD-10-CM

## 2020-04-27 NOTE — Therapy (Signed)
[place], [place], [address] Phone: [phone]   Fax:  [phone]  Physical Therapy Treatment  Patient Details  Name: Beth [name] MRN: [id number] Date of Birth: 09-02-1975 Referring Provider (PT): [name], NP   Encounter Date: 04/27/2020   PT End of Session - 04/27/20 1936    Visit Number 5    Number of Visits 13    Date for PT Re-Evaluation 05/08/20    Authorization Type BCBS COMM PPO    Progress Note Due on Visit 10    PT Start Time 1835    PT Stop Time 1920    PT Time Calculation (min) 45 min    Equipment Utilized During Treatment Other (comment)   RW   Activity Tolerance Patient tolerated treatment well    Behavior During Therapy [facility] for tasks assessed/performed           Past Medical History:  Diagnosis Date  . Asthma   . Chronic pancreatitis ([place])   . DDD (degenerative disc disease), lumbosacral   . DJD (degenerative joint disease)   . Hyperlipidemia   . Hypertension   . Hypoglycemia   . Obesity   . Osteoarthritis   . Prolactinoma ([place])   . Sciatica     Past Surgical History:  Procedure Laterality Date  . CHOLECYSTECTOMY    . COLONOSCOPY      There were no vitals filed for this visit.   Subjective Assessment - 04/27/20 1842    Subjective Pt reports she is continuing to improve. Pt states she complete stretches in the AM to loosen up before getting out of bed. She notes her work is getting her an adjustable bdesk. Pt reports she is tired and her back is bothering her a little moe related to increased activity and sitting at work. pt reports the body mechanics for home activities was helpful.    Diagnostic tests MRI scheduled for 10/2. X-rays 9/8: X-rays lumbar spine reveal cholecystectomy surgical clips. She has a grade 1 anterolisthesis of L4 on L5. She has mild to moderate degenerative disc disease at L5-S1. 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    Patient  Stated Goals To be active with less pain s medications    Currently in Pain? Yes    Pain Score 2     Pain Location Back    Pain Orientation Lower                             [facility] Adult PT Treatment/Exercise - 04/27/20 0001      Lumbar Exercises: Supine   Pelvic Tilt 10 reps    Pelvic Tilt Limitations 3 sec, c pelvic floor activation    Bridge 15 reps    Other Supine Lumbar Exercises Mini-crunch reach to ankles,10x, 3 sec, arm beats up/down; pelvic floor activation      Lumbar Exercises: Sidelying   Hip Abduction 10 reps;2 seconds      Lumbar Exercises: Quadruped   Other Quadruped Lumbar Exercises rocking, with ab set 10x                  PT Education - 04/27/20 1935    Education Details HEP for hip strengthening    Person(s) Educated Patient    Methods Explanation;Demonstration;Tactile cues;Verbal cues;Handout    Comprehension Verbalized understanding;Returned demonstration;Verbal cues required;Tactile cues required  PT Short Term Goals - 03/18/20 0959      PT SHORT TERM GOAL #1   Title Pt wll be ind in a final HEP    Baseline started on eval    Status New    Target Date 04/08/20      PT SHORT TERM GOAL #2   Title Pt will voice understanding of measures to assist in pain reduction and management.    Status New    Target Date 04/08/20             PT Long Term Goals - 03/18/20 1330      PT LONG TERM GOAL #1   Title Improved trunk ROM to at least 75% of normal for all movements    Baseline See flowsheets    Status New    Target Date 05/01/20      PT LONG TERM GOAL #2   Title Pt's will report improved ability to complete usual work and daily activities to a little difficulty    Baseline Extreme difficulty    Status New    Target Date 05/01/20      PT LONG TERM GOAL #3   Title Pt will report improved ability to stand from a chair to no difficulty    Baseline moderate difficulty    Status New    Target Date 05/01/20       PT LONG TERM GOAL #4   Title pt's FOTO survey score will improved to the predicted value of 52% limitation    Baseline 74% limitation    Period Weeks    Status New    Target Date 05/01/20      PT LONG TERM GOAL #5   Title Pt will be ind in a final HEP    Status New    Target Date 05/01/20                 Plan - 04/27/20 1937    Clinical Impression Statement PT focused on hip and core stregthening for low back stability. Pt has decreased hip strength as noted by limited bridging and hip ext excursion. Pt reports body mechanics for home activities and work station computer set up as being helpful. 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    Personal Factors and Comorbidities Comorbidity 2;Fitness    Examination-Activity Limitations Bed Mobility;Continence;Lift;Stand;Stairs;Squat;Sleep;Sit;Locomotion Level    Stability/Clinical Decision Making Evolving/Moderate complexity    Clinical Decision Making Moderate    Rehab Potential Good    PT Duration 6 weeks    PT Treatment/Interventions ADLs/Self Care Home Management;Cryotherapy;Electrical Stimulation;Ultrasound;Traction;Moist Heat;Iontophoresis 4mg /ml Dexamethasone;Gait training;Stair training;Functional mobility training;Therapeutic activities;Therapeutic exercise;Balance training;Neuromuscular re-education;Manual techniques;Patient/family education;Dry needling;Energy conservation;Taping;Vasopneumatic Device    PT Next Visit Plan Progress back, abs, and LE strengthening as tolerated    PT Home Exercise Plan [id number]    Consulted and Agree with Plan of Care Patient           Patient will benefit from skilled therapeutic intervention in order to improve the following deficits and impairments:  Abnormal gait, Decreased range of motion, Difficulty walking, Decreased endurance, Increased muscle spasms, Obesity, Decreased activity tolerance, Pain, Decreased balance, Decreased mobility, Decreased strength, Postural  dysfunction  Visit Diagnosis: Acute midline low back pain without sciatica  Muscle weakness (generalized)  Anterolisthesis  Abnormal posture  Difficulty in walking, not elsewhere classified  Other abnormalities of gait and mobility  DDD (degenerative disc disease), lumbosacral     Problem List Patient Active Problem List  Diagnosis Date Noted  . DJD (degenerative joint disease)   . Sciatica   . Osteoarthritis   . Obstructive sleep apnea 03/11/2020  . Snoring 02/05/2020  . Pneumonia due to COVID-19 virus 12/15/2019  . Acute respiratory failure with hypoxia ([place]) 12/15/2019  . Hypokalemia 12/15/2019  . Asthma without acute exacerbation 12/15/2019  . Chronic pancreatitis ([place])   . Hyperprolactinemia ([place]) 02/15/2018   Gar Ponto MS, PT 04/27/20 7:47 PM  [facility] [address] [place], [place], [address] Phone: [phone]   Fax:  [phone]  Name: Beth [name] MRN: [id number] Date of Birth: 1975-12-13

## 2020-04-29 ENCOUNTER — Ambulatory Visit (INDEPENDENT_AMBULATORY_CARE_PROVIDER_SITE_OTHER): Payer: BC Managed Care – PPO | Admitting: Primary Care

## 2020-04-29 DIAGNOSIS — N390 Urinary tract infection, site not specified: Secondary | ICD-10-CM | POA: Diagnosis not present

## 2020-04-30 DIAGNOSIS — G4733 Obstructive sleep apnea (adult) (pediatric): Secondary | ICD-10-CM | POA: Diagnosis not present

## 2020-05-01 ENCOUNTER — Ambulatory Visit: Payer: BC Managed Care – PPO

## 2020-05-01 ENCOUNTER — Other Ambulatory Visit: Payer: Self-pay

## 2020-05-01 DIAGNOSIS — R293 Abnormal posture: Secondary | ICD-10-CM | POA: Diagnosis not present

## 2020-05-01 DIAGNOSIS — R262 Difficulty in walking, not elsewhere classified: Secondary | ICD-10-CM

## 2020-05-01 DIAGNOSIS — M5137 Other intervertebral disc degeneration, lumbosacral region: Secondary | ICD-10-CM | POA: Diagnosis not present

## 2020-05-01 DIAGNOSIS — Z20822 Contact with and (suspected) exposure to covid-19: Secondary | ICD-10-CM | POA: Diagnosis not present

## 2020-05-01 DIAGNOSIS — M545 Low back pain, unspecified: Secondary | ICD-10-CM | POA: Diagnosis not present

## 2020-05-01 DIAGNOSIS — M6281 Muscle weakness (generalized): Secondary | ICD-10-CM | POA: Diagnosis not present

## 2020-05-01 DIAGNOSIS — M4310 Spondylolisthesis, site unspecified: Secondary | ICD-10-CM | POA: Diagnosis not present

## 2020-05-01 DIAGNOSIS — M62830 Muscle spasm of back: Secondary | ICD-10-CM | POA: Diagnosis not present

## 2020-05-01 DIAGNOSIS — R2689 Other abnormalities of gait and mobility: Secondary | ICD-10-CM | POA: Diagnosis not present

## 2020-05-02 NOTE — Therapy (Signed)
[place], [place], [address] Phone: [phone]   Fax:  [phone]  Physical Therapy Treatment  Patient Details  Name: Beth [name] MRN: [id number] Date of Birth: [birth year] Referring Provider (PT): [name], NP   Encounter Date: 05/01/2020   PT End of Session - 05/02/20 0722    Visit Number 6    Number of Visits 13    Date for PT Re-Evaluation 05/08/20    Authorization Type BCBS COMM PPO    Progress Note Due on Visit 10    PT Start Time 1215    PT Stop Time 1303    PT Time Calculation (min) 48 min    Equipment Utilized During Treatment Other (comment)   RW   Activity Tolerance Patient tolerated treatment well    Behavior During Therapy [facility] for tasks assessed/performed           Past Medical History:  Diagnosis Date  . Asthma   . Chronic pancreatitis ([place])   . DDD (degenerative disc disease), lumbosacral   . DJD (degenerative joint disease)   . Hyperlipidemia   . Hypertension   . Hypoglycemia   . Obesity   . Osteoarthritis   . Prolactinoma ([place])   . Sciatica     Past Surgical History:  Procedure Laterality Date  . CHOLECYSTECTOMY    . COLONOSCOPY      There were no vitals filed for this visit.   Subjective Assessment - 05/02/20 2007    Subjective Pt reports no low back pain at rest, but 2/10 with movement. Pt reports she is walk about 2545ft per day    Pertinent History Back issue 2012 received PT and was told the issue appeared to be disc related. Covid, 6/21; obesity    Limitations Sitting;Lifting;Standing;Walking;House hold activities    Diagnostic tests MRI scheduled for 10/2. X-rays 9/8: X-rays lumbar spine reveal cholecystectomy surgical clips. She has a grade 1 anterolisthesis of L4 on L5. She has mild to moderate degenerative disc disease at L5-S1. 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    Patient Stated Goals To be active with less pain s medications     Currently in Pain? Yes    Pain Score 2     Pain Location Back    Pain Orientation Lower    Pain Descriptors / Indicators Aching;Tightness    Pain Type Chronic pain    Pain Onset More than a month ago    Pain Frequency Intermittent              [facility] PT Assessment - 05/02/20 0001      Ambulation/Gait   Gait Comments 2 MWT 270 ft, pre-walk O2 sat 97%, HR 78. Post walk O2 sat 95%, HR 102      Standardized Balance Assessment   Standardized Balance Assessment Berg Balance Test      Berg Balance Test   Sit to Stand Able to stand without using hands and stabilize independently    Standing Unsupported Able to stand safely 2 minutes    Sitting with Back Unsupported but Feet Supported on Floor or Stool Able to sit safely and securely 2 minutes    Stand to Sit Sits safely with minimal use of hands    Transfers Able to transfer safely, minor use of hands    Standing Unsupported with Eyes Closed Able to stand 10 seconds safely    Standing Unsupported with Feet Together Able to place feet together  independently and stand 1 minute safely    From Standing, Reach Forward with Outstretched Arm Can reach forward >12 cm safely (5")    From Standing Position, Pick up Object from [place] to pick up shoe safely and easily    From Standing Position, Turn to Look Behind Over each Shoulder Looks behind from both sides and weight shifts well    Turn 360 Degrees Able to turn 360 degrees safely one side only in 4 seconds or less    Standing Unsupported, Alternately Place Feet on Step/Stool Able to stand independently and safely and complete 8 steps in 20 seconds    Standing Unsupported, One Foot in Front Able to plae foot ahead of the other independently and hold 30 seconds    Standing on One Leg Able to lift leg independently and hold > 10 seconds    Total Score 53                         [facility] Adult PT Treatment/Exercise - 05/02/20 0001      Lumbar Exercises: Stretches   Active  Hamstring Stretch Right;Left;2 reps;20 seconds    Other Lumbar Stretch Exercise PPT in sitting    Other Lumbar Stretch Exercise Sitting low back stretch 2x, 20 sec      Manual Therapy   Manual Therapy Soft tissue mobilization    Soft tissue mobilization Tennis ball for low back IASTM                  PT Education - 05/02/20 1959    Education Details Use of tennis ball for massage. RPE, pursed lip breathing, and gradual progression walking program to progress activitiy tolerance.    Person(s) Educated Patient    Methods Explanation;Demonstration;Tactile cues;Verbal cues    Comprehension Verbalized understanding;Returned demonstration            PT Short Term Goals - 05/02/20 2002      PT SHORT TERM GOAL #1   Title Pt wll be ind in a final HEP. Achieved    Status Achieved    Target Date 05/01/20      PT SHORT TERM GOAL #2   Title Pt will voice understanding of measures to assist in pain reduction and management. Achieved    Status Achieved    Target Date 05/01/20             PT Long Term Goals - 05/02/20 2024      Additional Long Term Goals   Additional Long Term Goals Yes      PT LONG TERM GOAL #6   Title Increase walking distance with the 36mwt to 360ft    Baseline 223ft    Status New    Target Date 05/08/20                 Plan - 05/02/20 0724    Clinical Impression Statement Pt assessed 6mwt s 4wrw and balance. Berg = 53/56 demonstrating good balance. 55mwt = 270 ft c pt's O2% remaining at a good level with increase in HR and pt rating RPE of 7/10. Reviewed exs and use of a tennis ball for massage to halp manage low back pain, which pt completed correctly and experienced a good result of resolved pain. Additionally, instructed pt in RPE, pursed lip breathing for DOE exertion, and gradual progression of walking time/distance completing 3 to 4x a day to improved tolance to activity. Pt voiced understanding of instruction. Pt  is to try a wean herself  from the RW which she is using for low back support and decreased activity tolerance, not for balance assistance.    Personal Factors and Comorbidities Comorbidity 2;Fitness    Comorbidities Deconditioing related to covid; obesity    Examination-Activity Limitations Bed Mobility;Continence;Lift;Stand;Stairs;Squat;Sleep;Sit;Locomotion Level    Stability/Clinical Decision Making Evolving/Moderate complexity    Clinical Decision Making Moderate    Rehab Potential Good    PT Frequency 2x / week    PT Duration 6 weeks    PT Treatment/Interventions ADLs/Self Care Home Management;Cryotherapy;Electrical Stimulation;Ultrasound;Traction;Moist Heat;Iontophoresis 4mg /ml Dexamethasone;Gait training;Stair training;Functional mobility training;Therapeutic activities;Therapeutic exercise;Balance training;Neuromuscular re-education;Manual techniques;Patient/family education;Dry needling;Energy conservation;Taping;Vasopneumatic Device    PT Next Visit Plan Progress back, abs, and LE strengthening as tolerated. Complete activities to improve endurance.    PT Home Exercise Plan [id number]    Consulted and Agree with Plan of Care Patient           Patient will benefit from skilled therapeutic intervention in order to improve the following deficits and impairments:  Abnormal gait, Decreased range of motion, Difficulty walking, Decreased endurance, Increased muscle spasms, Obesity, Decreased activity tolerance, Pain, Decreased balance, Decreased mobility, Decreased strength, Postural dysfunction  Visit Diagnosis: Acute midline low back pain without sciatica  Muscle weakness (generalized)  Anterolisthesis  Abnormal posture  Difficulty in walking, not elsewhere classified  Other abnormalities of gait and mobility  DDD (degenerative disc disease), lumbosacral  Muscle spasm of back     Problem List Patient Active Problem List   Diagnosis Date Noted  . DJD (degenerative joint disease)   . Sciatica   .  Osteoarthritis   . Obstructive sleep apnea 03/11/2020  . Snoring 02/05/2020  . Pneumonia due to COVID-19 virus 12/15/2019  . Acute respiratory failure with hypoxia ([place]) 12/15/2019  . Hypokalemia 12/15/2019  . Asthma without acute exacerbation 12/15/2019  . Chronic pancreatitis ([place])   . Hyperprolactinemia ([place]) 02/15/2018    Gar Ponto MS, PT 05/02/20 8:29 PM  [facility] [address] [place], [place], [address] Phone: [phone]   Fax:  [phone]  Name: Beth [name] MRN: [id number] Date of Birth: [birth year]

## 2020-05-05 ENCOUNTER — Ambulatory Visit: Payer: BC Managed Care – PPO

## 2020-05-05 ENCOUNTER — Other Ambulatory Visit: Payer: Self-pay

## 2020-05-05 DIAGNOSIS — M6281 Muscle weakness (generalized): Secondary | ICD-10-CM

## 2020-05-05 DIAGNOSIS — M62830 Muscle spasm of back: Secondary | ICD-10-CM | POA: Diagnosis not present

## 2020-05-05 DIAGNOSIS — R2689 Other abnormalities of gait and mobility: Secondary | ICD-10-CM | POA: Diagnosis not present

## 2020-05-05 DIAGNOSIS — M5137 Other intervertebral disc degeneration, lumbosacral region: Secondary | ICD-10-CM

## 2020-05-05 DIAGNOSIS — R293 Abnormal posture: Secondary | ICD-10-CM

## 2020-05-05 DIAGNOSIS — M545 Low back pain, unspecified: Secondary | ICD-10-CM

## 2020-05-05 DIAGNOSIS — R262 Difficulty in walking, not elsewhere classified: Secondary | ICD-10-CM

## 2020-05-05 DIAGNOSIS — M4310 Spondylolisthesis, site unspecified: Secondary | ICD-10-CM | POA: Diagnosis not present

## 2020-05-06 NOTE — Therapy (Signed)
[place], [place], [address] Phone: [phone]   Fax:  [phone]  Physical Therapy Treatment/Re-Cert  Patient Details  Name: Beth [name] MRN: [id number] Date of Birth: 04-May-1976 Referring Provider (PT): [name], NP   Encounter Date: 05/05/2020    Past Medical History:  Diagnosis Date  . Asthma   . Chronic pancreatitis ([place])   . DDD (degenerative disc disease), lumbosacral   . DJD (degenerative joint disease)   . Hyperlipidemia   . Hypertension   . Hypoglycemia   . Obesity   . Osteoarthritis   . Prolactinoma ([place])   . Sciatica     Past Surgical History:  Procedure Laterality Date  . CHOLECYSTECTOMY    . COLONOSCOPY      There were no vitals filed for this visit.   Subjective Assessment - 05/05/20 1849    Subjective Pt reports having a busy day and she is having some stiffness of her low back and sides.    Diagnostic tests MRI scheduled for 10/2. X-rays 9/8: X-rays lumbar spine reveal cholecystectomy surgical clips. She has a grade 1 anterolisthesis of L4 on L5. She has mild to moderate degenerative disc disease at L5-S1. 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    Patient Stated Goals To be active with less pain s medications    Currently in Pain? Yes    Pain Score 2     Pain Location Back    Pain Orientation Lower    Pain Descriptors / Indicators Aching;Tightness    Pain Type Chronic pain    Pain Onset More than a month ago    Pain Frequency Intermittent              [facility] PT Assessment - 05/06/20 0001      AROM   Lumbar Flexion Full    Lumbar Extension 25% limitation    Lumbar - Right Side Bend 25% limitation    Lumbar - Left Side Bend 25% limitation    Lumbar - Right Rotation Full    Lumbar - Left Rotation Full                         [facility] Adult PT Treatment/Exercise - 05/06/20 0001      Exercises   Exercises Lumbar;Knee/Hip       Lumbar Exercises: Stretches   Single Knee to Chest Stretch Right;Left;1 rep;20 seconds    ITB Stretch Right;Left;1 rep;20 seconds    Piriformis Stretch Right;Left;1 rep;20 seconds    Piriformis Stretch Limitations 20 sec    Figure 4 Stretch 1 rep;20 seconds    Figure 4 Stretch Limitations ER      Lumbar Exercises: Aerobic   Recumbent Bike 10 mins; L5; arm and legs. Post NuStep- HR 90, O2 sat 96%      Lumbar Exercises: Supine   Pelvic Tilt 10 reps    Pelvic Tilt Limitations 3 sec, c pelvic floor activation    Dead Bug 10 reps    Dead Bug Limitations 1 set, legs only                  PT Education - 05/06/20 0511    Education Details Reviewed RPE and pursed lip breathing. low abck and hip stretching exs.    Person(s) Educated Patient    Methods Explanation    Comprehension Returned demonstration;Verbalized understanding  PT Short Term Goals - 05/02/20 2002      PT SHORT TERM GOAL #1   Title Pt wll be ind in a final HEP. Achieved    Status Achieved    Target Date 05/01/20      PT SHORT TERM GOAL #2   Title Pt will voice understanding of measures to assist in pain reduction and management. Achieved    Status Achieved    Target Date 05/01/20             PT Long Term Goals - 05/06/20 0520      PT LONG TERM GOAL #1   Title Improved trunk ROM to at least 75% of normal for all movements. Achieved    Status Achieved    Target Date 05/05/20      PT LONG TERM GOAL #2   Title Pt's will report improved ability to complete usual work and daily activities to a little difficulty. On-going- Moderate difficulty    Baseline Extreme difficulty    Status On-going    Target Date 07/10/20      PT LONG TERM GOAL #3   Title Pt will report improved ability to stand from a chair to no difficulty. On-going- a little difficulty    Baseline moderate difficulty    Status On-going    Target Date 07/10/20      PT LONG TERM GOAL #4   Title pt's FOTO survey score will  improved to the predicted value of 52% limitation. On-going    Status On-going    Target Date 07/10/20      PT LONG TERM GOAL #5   Title Pt will be ind in a final HEP. On-going    Status On-going    Target Date 07/10/20      PT LONG TERM GOAL #6   Title Increase walking distance with the 16mwt to 372ft. On-going    Baseline 226ft    Status On-going    Target Date 07/10/20                 Plan - 05/06/20 0512    Clinical Impression Statement Pt completed NuStep for activity tolerance challenge. Pt maintained RPE to 5/10 or less and used pursed lip breathing for recovery. Pt was taught additional exs for low back and hip stretching. After the PT session, pt stated she low back pain and tightness was resolved.    Personal Factors and Comorbidities Comorbidity 2;Fitness    Comorbidities Deconditioning related to covid; obesity    Examination-Activity Limitations Bed Mobility;Continence;Lift;Stand;Stairs;Squat;Sleep;Sit;Locomotion Level    Stability/Clinical Decision Making Evolving/Moderate complexity    Clinical Decision Making Moderate    Rehab Potential Good    PT Frequency 2x / week    PT Duration 6 weeks    PT Treatment/Interventions ADLs/Self Care Home Management;Cryotherapy;Electrical Stimulation;Ultrasound;Traction;Moist Heat;Iontophoresis 4mg /ml Dexamethasone;Gait training;Stair training;Functional mobility training;Therapeutic activities;Therapeutic exercise;Balance training;Neuromuscular re-education;Manual techniques;Patient/family education;Dry needling;Energy conservation;Taping;Vasopneumatic Device    PT Next Visit Plan Focus PT on activity tolerance and strengthening to assist pt in moving away from assitance of the 4wrw.    PT Home Exercise Plan [id number]    Consulted and Agree with Plan of Care Patient           Patient will benefit from skilled therapeutic intervention in order to improve the following deficits and impairments:  Abnormal gait, Decreased range  of motion, Difficulty walking, Decreased endurance, Increased muscle spasms, Obesity, Decreased activity tolerance, Pain, Decreased balance, Decreased mobility, Decreased strength, Postural dysfunction  Visit  Diagnosis: Acute midline low back pain without sciatica  Muscle weakness (generalized)  Anterolisthesis  Abnormal posture  Difficulty in walking, not elsewhere classified  Other abnormalities of gait and mobility  DDD (degenerative disc disease), lumbosacral  Muscle spasm of back     Problem List Patient Active Problem List   Diagnosis Date Noted  . DJD (degenerative joint disease)   . Sciatica   . Osteoarthritis   . Obstructive sleep apnea 03/11/2020  . Snoring 02/05/2020  . Pneumonia due to COVID-19 virus 12/15/2019  . Acute respiratory failure with hypoxia ([place]) 12/15/2019  . Hypokalemia 12/15/2019  . Asthma without acute exacerbation 12/15/2019  . Chronic pancreatitis ([place])   . Hyperprolactinemia ([place]) 02/15/2018    Gar Ponto MS, PT 05/06/20 5:30 AM  [facility] [address] [place], [place], [address] Phone: [phone]   Fax:  [phone]  Name: Beth [name] MRN: [id number] Date of Birth: 22-Dec-1975

## 2020-05-08 DIAGNOSIS — Z20822 Contact with and (suspected) exposure to covid-19: Secondary | ICD-10-CM | POA: Diagnosis not present

## 2020-05-15 ENCOUNTER — Encounter: Payer: Self-pay | Admitting: Rehabilitative and Restorative Service Providers"

## 2020-05-15 ENCOUNTER — Other Ambulatory Visit: Payer: Self-pay

## 2020-05-15 ENCOUNTER — Ambulatory Visit: Payer: BC Managed Care – PPO | Admitting: Rehabilitative and Restorative Service Providers"

## 2020-05-15 DIAGNOSIS — M62830 Muscle spasm of back: Secondary | ICD-10-CM | POA: Diagnosis not present

## 2020-05-15 DIAGNOSIS — R262 Difficulty in walking, not elsewhere classified: Secondary | ICD-10-CM | POA: Diagnosis not present

## 2020-05-15 DIAGNOSIS — M4310 Spondylolisthesis, site unspecified: Secondary | ICD-10-CM

## 2020-05-15 DIAGNOSIS — R2689 Other abnormalities of gait and mobility: Secondary | ICD-10-CM | POA: Diagnosis not present

## 2020-05-15 DIAGNOSIS — Z20822 Contact with and (suspected) exposure to covid-19: Secondary | ICD-10-CM | POA: Diagnosis not present

## 2020-05-15 DIAGNOSIS — M6281 Muscle weakness (generalized): Secondary | ICD-10-CM | POA: Diagnosis not present

## 2020-05-15 DIAGNOSIS — M545 Low back pain, unspecified: Secondary | ICD-10-CM

## 2020-05-15 DIAGNOSIS — M5137 Other intervertebral disc degeneration, lumbosacral region: Secondary | ICD-10-CM | POA: Diagnosis not present

## 2020-05-15 DIAGNOSIS — R293 Abnormal posture: Secondary | ICD-10-CM

## 2020-05-15 NOTE — Therapy (Signed)
[place], [place], [address] Phone: [phone]   Fax:  [phone]  Physical Therapy Treatment  Patient Details  Name: Beth [name] MRN: [id number] Date of Birth: 07/16/1975 Referring Provider (PT): [name], NP   Encounter Date: 05/15/2020   PT End of Session - 05/15/20 0857    Visit Number 8    Number of Visits 13    PT Start Time 0092    PT Stop Time 0947    PT Time Calculation (min) 49 min    Activity Tolerance Patient tolerated treatment well    Behavior During Therapy [facility] for tasks assessed/performed           Past Medical History:  Diagnosis Date  . Asthma   . Chronic pancreatitis ([place])   . DDD (degenerative disc disease), lumbosacral   . DJD (degenerative joint disease)   . Hyperlipidemia   . Hypertension   . Hypoglycemia   . Obesity   . Osteoarthritis   . Prolactinoma ([place])   . Sciatica     Past Surgical History:  Procedure Laterality Date  . CHOLECYSTECTOMY    . COLONOSCOPY      There were no vitals filed for this visit.   Subjective Assessment - 05/15/20 0857    Subjective 1/10 pain; had not done morning exercises this morning b/c I came in here so early.    How long can you sit comfortably? 45 min to 1 hour    How long can you stand comfortably? 10 min    Currently in Pain? Yes    Pain Score 1     Pain Location Back    Pain Orientation Lower    Pain Descriptors / Indicators Aching;Tightness    Pain Type Chronic pain    Pain Onset More than a month ago    Pain Frequency Intermittent    Aggravating Factors  walking    Pain Relieving Factors rest    Multiple Pain Sites No                             [facility] Adult PT Treatment/Exercise - 05/15/20 0001      Lumbar Exercises: Aerobic   Other Aerobic Exercise Gait/aerobic conditioning with PT cueing for fast and slow gait along with breathing techniques with O2 saturation at rest at  97, at 2 min 40 sec 97, at 4 min 48 sec 96, and at 95 after doing 2 bouts of the 6 inch stairs at 6'40". Total gait time 7'40".      Lumbar Exercises: Supine   Other Supine Lumbar Exercises tilt with shoulder depression x 20      Knee/Hip Exercises: Standing   Other Standing Knee Exercises STS from EOB with hands x 10 with concentration on tilt and glute set as well upon standing      Knee/Hip Exercises: Supine   Other Supine Knee/Hip Exercises lower trunk rotation 4x10 sec each side; supine knee to opposite shoulder 2x30 sec each side; tilt/glute set clam shell review for HEP      Knee/Hip Exercises: Sidelying   Other Sidelying Knee/Hip Exercises sidelying hip abduct review x 10 with tilt                  PT Education - 05/15/20 1005    Education Details discussed with pt performing tilt with all transitional movements    Person(s) Educated Patient  Methods Explanation;Demonstration    Comprehension Verbalized understanding;Returned demonstration            PT Short Term Goals - 05/02/20 2002      PT SHORT TERM GOAL #1   Title Pt wll be ind in a final HEP. Achieved    Status Achieved    Target Date 05/01/20      PT SHORT TERM GOAL #2   Title Pt will voice understanding of measures to assist in pain reduction and management. Achieved    Status Achieved    Target Date 05/01/20             PT Long Term Goals - 05/15/20 0934      PT LONG TERM GOAL #2   Title Pt's will report improved ability to complete usual work and daily activities to a little difficulty. On-going- Moderate difficulty    Baseline Extreme difficulty    Status On-going      PT LONG TERM GOAL #3   Title Pt will report improved ability to stand from a chair to no difficulty. On-going- a little difficulty    Baseline moderate difficulty    Status On-going      PT LONG TERM GOAL #4   Title pt's FOTO survey score will improved to the predicted value of 52% limitation. On-going    Status  On-going      PT LONG TERM GOAL #5   Title Pt will be ind in a final HEP. On-going    Status On-going                 Plan - 05/15/20 0932    Clinical Impression Statement Pt presents to PT with overall breathing deconditioning which is hindering her functional mobility. Core weakness and back tightness contribute to back pain especially with static standing. Pt would benefit from further PT for endurance therex and lumbar/core strengthening to assist with improved fxn. Pt needed verbal cues for breathing techniques and to decrease cadence of ambulation.    Rehab Potential Good    PT Frequency 2x / week    PT Duration 6 weeks    PT Treatment/Interventions ADLs/Self Care Home Management;Cryotherapy;Electrical Stimulation;Ultrasound;Traction;Moist Heat;Iontophoresis 4mg /ml Dexamethasone;Gait training;Stair training;Functional mobility training;Therapeutic activities;Therapeutic exercise;Balance training;Neuromuscular re-education;Manual techniques;Patient/family education;Dry needling;Energy conservation;Taping;Vasopneumatic Device    PT Next Visit Plan Focus PT on activity tolerance and strengthening to assist pt in moving away from assistance of the 4wrw. Lumbar/core strengthening    Consulted and Agree with Plan of Care Patient           Patient will benefit from skilled therapeutic intervention in order to improve the following deficits and impairments:  Abnormal gait, Decreased range of motion, Difficulty walking, Decreased endurance, Increased muscle spasms, Obesity, Decreased activity tolerance, Pain, Decreased balance, Decreased mobility, Decreased strength, Postural dysfunction  Visit Diagnosis: Acute midline low back pain without sciatica  Muscle weakness (generalized)  Anterolisthesis  Abnormal posture  Difficulty in walking, not elsewhere classified  Other abnormalities of gait and mobility  DDD (degenerative disc disease), lumbosacral  Muscle spasm of  back     Problem List Patient Active Problem List   Diagnosis Date Noted  . DJD (degenerative joint disease)   . Sciatica   . Osteoarthritis   . Obstructive sleep apnea 03/11/2020  . Snoring 02/05/2020  . Pneumonia due to COVID-19 virus 12/15/2019  . Acute respiratory failure with hypoxia ([place]) 12/15/2019  . Hypokalemia 12/15/2019  . Asthma without acute exacerbation 12/15/2019  . Chronic pancreatitis ([place])   .  Hyperprolactinemia ([place]) 02/15/2018    Tyhesha Dutson, PT 05/15/2020, 10:09 AM  [facility] [address] [place], [place], [address] Phone: [phone]   Fax:  [phone]  Name: Kemari Mares MRN: [id number] Date of Birth: 1975-08-03

## 2020-05-18 ENCOUNTER — Encounter: Payer: Self-pay | Admitting: Rehabilitative and Restorative Service Providers"

## 2020-05-18 ENCOUNTER — Ambulatory Visit: Payer: BC Managed Care – PPO | Admitting: Rehabilitative and Restorative Service Providers"

## 2020-05-18 ENCOUNTER — Other Ambulatory Visit: Payer: Self-pay

## 2020-05-18 DIAGNOSIS — M5137 Other intervertebral disc degeneration, lumbosacral region: Secondary | ICD-10-CM

## 2020-05-18 DIAGNOSIS — R262 Difficulty in walking, not elsewhere classified: Secondary | ICD-10-CM | POA: Diagnosis not present

## 2020-05-18 DIAGNOSIS — M545 Low back pain, unspecified: Secondary | ICD-10-CM | POA: Diagnosis not present

## 2020-05-18 DIAGNOSIS — M6281 Muscle weakness (generalized): Secondary | ICD-10-CM | POA: Diagnosis not present

## 2020-05-18 DIAGNOSIS — R2689 Other abnormalities of gait and mobility: Secondary | ICD-10-CM

## 2020-05-18 DIAGNOSIS — M4310 Spondylolisthesis, site unspecified: Secondary | ICD-10-CM

## 2020-05-18 DIAGNOSIS — M62830 Muscle spasm of back: Secondary | ICD-10-CM | POA: Diagnosis not present

## 2020-05-18 DIAGNOSIS — R293 Abnormal posture: Secondary | ICD-10-CM | POA: Diagnosis not present

## 2020-05-18 NOTE — Therapy (Signed)
[place], [place], [address] Phone: [phone]   Fax:  [phone]  Physical Therapy Treatment  Patient Details  Name: Beth [name] MRN: [id number] Date of Birth: 1976-04-12 Referring Provider (PT): [name], NP   Encounter Date: 05/18/2020   PT End of Session - 05/18/20 1846    Visit Number 9    Number of Visits 13    Date for PT Re-Evaluation 05/08/20    Authorization Type BCBS COMM PPO    Progress Note Due on Visit 10    PT Start Time 0619    PT Stop Time 0712    PT Time Calculation (min) 53 min    Activity Tolerance Patient tolerated treatment well;No increased pain    Behavior During Therapy WFL for tasks assessed/performed           Past Medical History:  Diagnosis Date  . Asthma   . Chronic pancreatitis ([place])   . DDD (degenerative disc disease), lumbosacral   . DJD (degenerative joint disease)   . Hyperlipidemia   . Hypertension   . Hypoglycemia   . Obesity   . Osteoarthritis   . Prolactinoma ([place])   . Sciatica     Past Surgical History:  Procedure Laterality Date  . CHOLECYSTECTOMY    . COLONOSCOPY      There were no vitals filed for this visit.   Subjective Assessment - 05/18/20 1827    Subjective Got my second shot and it's bothering me. Back is 1/10 with  moving.    Currently in Pain? Yes    Pain Score 1     Pain Location Back    Pain Orientation Lower    Pain Descriptors / Indicators Aching;Tightness    Pain Onset More than a month ago    Pain Frequency Intermittent    Multiple Pain Sites No                             [facility] Adult PT Treatment/Exercise - 05/18/20 0001      Lumbar Exercises: Seated   Other Seated Lumbar Exercises seated trunk flexion stretch 3x15 sec, seated EOM tilt with scap retract x 20; seated trunk hinge x 15 with tilt and proper muscle facilitation; seated trunk flexion stretch 1x30 sec; seated trunk  flexion with PT light pressure to L lumbar paraspinals where pt felt tight with pt reporting improvement in tightness      Lumbar Exercises: Supine   Other Supine Lumbar Exercises tilt with SLR x 15; lower trunk rotation x 2 with 15 sec hold; ball squeeze x 15 with 2-3 sec hold; ball squeeze with oblique reach x 15; butterfly stretch 2x30sec; single knee to chest 2x30 sec each; lower trunk rotation x 5 each side      Knee/Hip Exercises: Standing   Other Standing Knee Exercises GT around gym x 5 min with rollator with concentration on cadence control and breathing with 3 bouts of stair training performed for further LE/cardio strengthening,      Knee/Hip Exercises: Seated   Other Seated Knee/Hip Exercises NuStep bil UE/LEs level 2 with concentration on continual movement and breathing; seated EOM with tilt with LAQ x 15 unilat bil                    PT Short Term Goals - 05/02/20 2002      PT SHORT TERM GOAL #1   Title  Pt wll be ind in a final HEP. Achieved    Status Achieved    Target Date 05/01/20      PT SHORT TERM GOAL #2   Title Pt will voice understanding of measures to assist in pain reduction and management. Achieved    Status Achieved    Target Date 05/01/20             PT Long Term Goals - 05/15/20 0934      PT LONG TERM GOAL #2   Title Pt's will report improved ability to complete usual work and daily activities to a little difficulty. On-going- Moderate difficulty    Baseline Extreme difficulty    Status On-going      PT LONG TERM GOAL #3   Title Pt will report improved ability to stand from a chair to no difficulty. On-going- a little difficulty    Baseline moderate difficulty    Status On-going      PT LONG TERM GOAL #4   Title pt's FOTO survey score will improved to the predicted value of 52% limitation. On-going    Status On-going      PT LONG TERM GOAL #5   Title Pt will be ind in a final HEP. On-going    Status On-going                  Plan - 05/18/20 1846    Clinical Impression Statement Pt presents to PT with breathing deconditoining which is hindering her functional mobility. Core weakness and back weakness contribute to her LBP. Pt would benefit from further PT for cardio conditioning and lumbar/core strengthening and lumbar flexibility to assist with improved painfree function.    Rehab Potential Good    PT Frequency 2x / week    PT Duration 6 weeks    PT Treatment/Interventions ADLs/Self Care Home Management;Cryotherapy;Electrical Stimulation;Ultrasound;Traction;Moist Heat;Iontophoresis 4mg /ml Dexamethasone;Gait training;Stair training;Functional mobility training;Therapeutic activities;Therapeutic exercise;Balance training;Neuromuscular re-education;Manual techniques;Patient/family education;Dry needling;Energy conservation;Taping;Vasopneumatic Device    PT Next Visit Plan Focus PT on activity tolerance and strengthening to assist pt in moving away from assistance of the 4wrw. Lumbar/core strengthening    Consulted and Agree with Plan of Care Patient           Patient will benefit from skilled therapeutic intervention in order to improve the following deficits and impairments:  Abnormal gait, Decreased range of motion, Difficulty walking, Decreased endurance, Increased muscle spasms, Obesity, Decreased activity tolerance, Pain, Decreased balance, Decreased mobility, Decreased strength, Postural dysfunction  Visit Diagnosis: Acute midline low back pain without sciatica  Other abnormalities of gait and mobility  DDD (degenerative disc disease), lumbosacral  Muscle weakness (generalized)  Anterolisthesis  Muscle spasm of back  Abnormal posture  Difficulty in walking, not elsewhere classified     Problem List Patient Active Problem List   Diagnosis Date Noted  . DJD (degenerative joint disease)   . Sciatica   . Osteoarthritis   . Obstructive sleep apnea 03/11/2020  . Snoring 02/05/2020   . Pneumonia due to COVID-19 virus 12/15/2019  . Acute respiratory failure with hypoxia ([place]) 12/15/2019  . Hypokalemia 12/15/2019  . Asthma without acute exacerbation 12/15/2019  . Chronic pancreatitis ([place])   . Hyperprolactinemia ([place]) 02/15/2018    Shekira Drummer, PT 05/18/2020, 7:17 PM  [facility] [address] [place], [place], [address] Phone: [phone]   Fax:  [phone]  Name: Beth [name] MRN: [id number] Date of Birth: 11-20-1975

## 2020-05-22 DIAGNOSIS — Z20822 Contact with and (suspected) exposure to covid-19: Secondary | ICD-10-CM | POA: Diagnosis not present

## 2020-05-29 ENCOUNTER — Encounter: Payer: Self-pay | Admitting: Rehabilitative and Restorative Service Providers"

## 2020-05-29 ENCOUNTER — Other Ambulatory Visit: Payer: Self-pay

## 2020-05-29 ENCOUNTER — Ambulatory Visit: Payer: BC Managed Care – PPO | Attending: Primary Care | Admitting: Rehabilitative and Restorative Service Providers"

## 2020-05-29 DIAGNOSIS — R262 Difficulty in walking, not elsewhere classified: Secondary | ICD-10-CM | POA: Diagnosis not present

## 2020-05-29 DIAGNOSIS — M545 Low back pain, unspecified: Secondary | ICD-10-CM | POA: Diagnosis not present

## 2020-05-29 DIAGNOSIS — M4310 Spondylolisthesis, site unspecified: Secondary | ICD-10-CM | POA: Insufficient documentation

## 2020-05-29 DIAGNOSIS — M6281 Muscle weakness (generalized): Secondary | ICD-10-CM | POA: Diagnosis not present

## 2020-05-29 DIAGNOSIS — M62830 Muscle spasm of back: Secondary | ICD-10-CM | POA: Diagnosis not present

## 2020-05-29 DIAGNOSIS — R293 Abnormal posture: Secondary | ICD-10-CM | POA: Insufficient documentation

## 2020-05-29 DIAGNOSIS — M5137 Other intervertebral disc degeneration, lumbosacral region: Secondary | ICD-10-CM | POA: Diagnosis not present

## 2020-05-29 DIAGNOSIS — R2689 Other abnormalities of gait and mobility: Secondary | ICD-10-CM | POA: Insufficient documentation

## 2020-05-29 NOTE — Therapy (Signed)
[place], [place], [address] Phone: [phone]   Fax:  [phone]  Physical Therapy Treatment  Patient Details  Name: Beth [name] MRN: [id number] Date of Birth: 19-Sep-1975 Referring Provider (PT): [name], NP   Encounter Date: 05/29/2020   PT End of Session - 05/29/20 0828    Visit Number 10    Authorization Type BCBS COMM PPO    PT Start Time 0821    PT Stop Time 0906    PT Time Calculation (min) 45 min    Activity Tolerance Patient tolerated treatment well;No increased pain    Behavior During Therapy WFL for tasks assessed/performed           Past Medical History:  Diagnosis Date  . Asthma   . Chronic pancreatitis ([place])   . DDD (degenerative disc disease), lumbosacral   . DJD (degenerative joint disease)   . Hyperlipidemia   . Hypertension   . Hypoglycemia   . Obesity   . Osteoarthritis   . Prolactinoma ([place])   . Sciatica     Past Surgical History:  Procedure Laterality Date  . CHOLECYSTECTOMY    . COLONOSCOPY      There were no vitals filed for this visit.   Subjective Assessment - 05/29/20 0825    Subjective My back pain is a 1/10; tightening my stomach has really helped with the pain but the breathing is about the same. Pt late for appt    How long can you sit comfortably? 45 min to 1 hour    How long can you stand comfortably? 10 min    How long can you walk comfortably? 2000 steps in the course of a day    Patient Stated Goals To be active with less pain s medications    Currently in Pain? Yes    Pain Score 1     Pain Location Back    Pain Orientation Lower    Pain Descriptors / Indicators Tightness    Pain Type Chronic pain    Pain Onset More than a month ago    Pain Frequency Intermittent    Aggravating Factors  walking    Pain Relieving Factors holding stomach tight    Multiple Pain Sites No                             [facility]  Adult PT Treatment/Exercise - 05/29/20 0001      Lumbar Exercises: Aerobic   Other Aerobic Exercise GT with rollator with focus on breathing techniques and for change in direction for further balance challenge and functional strengthening x 2 bouts      Lumbar Exercises: Standing   Other Standing Lumbar Exercises standing at the counter: unilat hip ext x 15 bil 3 lbs; 3 lb hamstring curl unilat bil x 10; hip circles with hip abdct/ext combo x 10 each clockwise/counterclockwise unilat bil with tilt      Lumbar Exercises: Seated   Other Seated Lumbar Exercises NuStep bil UEs/LEs level 4 x 6 min with PT verbal cues for varied cadence to challenge core muscles and cardiovascular system; seated trunk flexion 2x30 sec      Lumbar Exercises: Supine   Other Supine Lumbar Exercises single knee to chest 2x30 sec each LE; lower trunk rotation x 3 each side with 15 sec hold  PT Short Term Goals - 05/29/20 0829      PT SHORT TERM GOAL #1   Title Pt wll be ind in a final HEP. Achieved      PT SHORT TERM GOAL #2   Title Pt will voice understanding of measures to assist in pain reduction and management. Achieved             PT Long Term Goals - 05/29/20 0829      PT LONG TERM GOAL #1   Title Improved trunk ROM to at least 75% of normal for all movements. Achieved      PT LONG TERM GOAL #2   Title Pt's will report improved ability to complete usual work and daily activities to a little difficulty. On-going- Moderate difficulty    Status On-going      PT LONG TERM GOAL #3   Title Pt will report improved ability to stand from a chair to no difficulty. On-going- moderate difficulty    Status On-going      PT LONG TERM GOAL #4   Title pt's FOTO survey score will improved to the predicted value of 52% limitation. On-going    Status On-going      PT LONG TERM GOAL #5   Title Pt will be ind in a final HEP. On-going    Status On-going      PT LONG TERM GOAL #6    Title Increase walking distance to 2500 steps to assist with shopping in community    Baseline 2000 steps    Time 6    Period Weeks    Status New    Target Date 07/10/20                 Plan - 05/29/20 0900    Clinical Impression Statement Pt continues to present to PT with breathing deconditioning which hinders functional mobility. She is demonstrating improvement in LBP with core strengthening and pelvic tilt with functional activities. pt would continue to benefit from PT for lumbar/core/LE/cardiovascular strengthening to assist with improved painfree function with better endurance.    Rehab Potential Good    PT Frequency 2x / week    PT Duration 6 weeks    PT Treatment/Interventions ADLs/Self Care Home Management;Cryotherapy;Electrical Stimulation;Ultrasound;Traction;Moist Heat;Iontophoresis 4mg /ml Dexamethasone;Gait training;Stair training;Functional mobility training;Therapeutic activities;Therapeutic exercise;Balance training;Neuromuscular re-education;Manual techniques;Patient/family education;Dry needling;Energy conservation;Taping;Vasopneumatic Device    PT Next Visit Plan Focus PT on activity tolerance and strengthening to assist pt in moving away from assistance of the 4wrw. Lumbar/core strengthening. Do progress note (all goals have been updated-Epic would not populate smartphrase for progress note)    Consulted and Agree with Plan of Care Patient           Patient will benefit from skilled therapeutic intervention in order to improve the following deficits and impairments:  Abnormal gait, Decreased range of motion, Difficulty walking, Decreased endurance, Increased muscle spasms, Obesity, Decreased activity tolerance, Pain, Decreased balance, Decreased mobility, Decreased strength, Postural dysfunction  Visit Diagnosis: Acute midline low back pain without sciatica  Other abnormalities of gait and mobility  DDD (degenerative disc disease), lumbosacral  Muscle  weakness (generalized)  Anterolisthesis  Muscle spasm of back  Abnormal posture  Difficulty in walking, not elsewhere classified     Problem List Patient Active Problem List   Diagnosis Date Noted  . DJD (degenerative joint disease)   . Sciatica   . Osteoarthritis   . Obstructive sleep apnea 03/11/2020  . Snoring 02/05/2020  . Pneumonia due to  COVID-19 virus 12/15/2019  . Acute respiratory failure with hypoxia ([place]) 12/15/2019  . Hypokalemia 12/15/2019  . Asthma without acute exacerbation 12/15/2019  . Chronic pancreatitis ([place])   . Hyperprolactinemia ([place]) 02/15/2018    Nyaja Dubuque, PT 05/29/2020, 12:47 PM  [facility] [address] [place], [place], [address] Phone: [phone]   Fax:  [phone]  Name: Brylin Stopper MRN: [id number] Date of Birth: 1975-11-18

## 2020-05-30 DIAGNOSIS — G4733 Obstructive sleep apnea (adult) (pediatric): Secondary | ICD-10-CM | POA: Diagnosis not present

## 2020-06-05 ENCOUNTER — Other Ambulatory Visit: Payer: Self-pay

## 2020-06-05 ENCOUNTER — Ambulatory Visit: Payer: BC Managed Care – PPO | Admitting: Rehabilitative and Restorative Service Providers"

## 2020-06-05 ENCOUNTER — Encounter: Payer: Self-pay | Admitting: Rehabilitative and Restorative Service Providers"

## 2020-06-05 DIAGNOSIS — M4310 Spondylolisthesis, site unspecified: Secondary | ICD-10-CM

## 2020-06-05 DIAGNOSIS — M5137 Other intervertebral disc degeneration, lumbosacral region: Secondary | ICD-10-CM | POA: Diagnosis not present

## 2020-06-05 DIAGNOSIS — M62830 Muscle spasm of back: Secondary | ICD-10-CM

## 2020-06-05 DIAGNOSIS — R293 Abnormal posture: Secondary | ICD-10-CM | POA: Diagnosis not present

## 2020-06-05 DIAGNOSIS — R2689 Other abnormalities of gait and mobility: Secondary | ICD-10-CM

## 2020-06-05 DIAGNOSIS — M545 Low back pain, unspecified: Secondary | ICD-10-CM | POA: Diagnosis not present

## 2020-06-05 DIAGNOSIS — M6281 Muscle weakness (generalized): Secondary | ICD-10-CM | POA: Diagnosis not present

## 2020-06-05 DIAGNOSIS — R262 Difficulty in walking, not elsewhere classified: Secondary | ICD-10-CM | POA: Diagnosis not present

## 2020-06-05 NOTE — Therapy (Signed)
[place], [place], [address] Phone: [phone]   Fax:  [phone]  Physical Therapy Treatment Physical Therapy progress notes dates of reporting period: 03/17/20-06/05/20   Patient Details  Name: Beth [name] MRN: [id number] Date of Birth: 1976-05-31 Referring Provider (PT): [name], NP   Encounter Date: 06/05/2020   PT End of Session - 06/05/20 0835    Visit Number 11    Number of Visits 13    Progress Note Due on Visit 10    PT Start Time 0817    PT Stop Time 0901    PT Time Calculation (min) 44 min    Equipment Utilized During Treatment Other (comment)   rollator   Activity Tolerance Patient tolerated treatment well;No increased pain    Behavior During Therapy WFL for tasks assessed/performed           Past Medical History:  Diagnosis Date  . Asthma   . Chronic pancreatitis ([place])   . DDD (degenerative disc disease), lumbosacral   . DJD (degenerative joint disease)   . Hyperlipidemia   . Hypertension   . Hypoglycemia   . Obesity   . Osteoarthritis   . Prolactinoma ([place])   . Sciatica     Past Surgical History:  Procedure Laterality Date  . CHOLECYSTECTOMY    . COLONOSCOPY      There were no vitals filed for this visit.   Subjective Assessment - 06/05/20 0831    Subjective As long as I don't do too much walking around or standing, I do OK.    How long can you sit comfortably? 45 min to 1 hour    How long can you stand comfortably? 10 min    How long can you walk comfortably? 2000 steps in the course of a day    Patient Stated Goals To be active with less pain s medications    Currently in Pain? Yes    Pain Score 1     Pain Location Back    Pain Orientation Lower    Pain Descriptors / Indicators Tightness    Pain Type Chronic pain    Pain Frequency Intermittent    Aggravating Factors  walking, standing    Pain Relieving Factors rest                              [facility] Adult PT Treatment/Exercise - 06/05/20 0001      Lumbar Exercises: Standing   Other Standing Lumbar Exercises gait with rollator throughout the gym with concentration on maintaining tilt and breathing, NuStep level 3 x 6 min bil UE/LE with concentration on breathing and varied speed; squats x 15 at counter; at counter, 3 lb hamstring curl x 20, 3 lb hip abduction x 20; standing hamstring stretch leaning over counter 2x30 sec;standing 3 lb SLR x 15 each side; standing leaning over the counter reaching to the opposite side with the left arm and then maintaining this position and doing modified small range of motion cat/camel x 30 sec which relieved back pressure; standing at parallel bar 3 lb SLR x 15 each side limited ROM      Lumbar Exercises: Seated   Other Seated Lumbar Exercises seated trunk flexion x 30 sec.      Lumbar Exercises: Supine   Other Supine Lumbar Exercises lower trunk rotation 2x30 sec each side  PT Short Term Goals - 05/29/20 0829      PT SHORT TERM GOAL #1   Title Pt wll be ind in a final HEP. Achieved      PT SHORT TERM GOAL #2   Title Pt will voice understanding of measures to assist in pain reduction and management. Achieved             PT Long Term Goals - 06/05/20 0839      PT LONG TERM GOAL #1   Title Improved trunk ROM to at least 75% of normal for all movements. Achieved      PT LONG TERM GOAL #2   Title Pt's will report improved ability to complete usual work and daily activities to a little difficulty. On-going- Moderate difficulty    Status On-going      PT LONG TERM GOAL #3   Title Pt will report improved ability to stand from a chair to no difficulty. On-going- moderate difficulty    Status On-going      PT LONG TERM GOAL #4   Title pt's FOTO survey score will improved to the predicted value of 52% limitation. On-going    Status On-going      PT LONG TERM GOAL #5    Title Pt will be ind in a final HEP. On-going    Status On-going                 Plan - 06/05/20 0836    Clinical Impression Statement Pt continues to present to PT with breathing difficulty with exercises and functional tasks. Overall general strengthening is improving for lumbar/core, and bil LEs. Pt would continue to benefit from PT for lumbar/core/LE cardio strengthening to assist with improved painfree function with better endurance.    Rehab Potential Good    PT Frequency 2x / week    PT Duration 6 weeks    PT Treatment/Interventions ADLs/Self Care Home Management;Cryotherapy;Electrical Stimulation;Ultrasound;Traction;Moist Heat;Iontophoresis 4mg /ml Dexamethasone;Gait training;Stair training;Functional mobility training;Therapeutic activities;Therapeutic exercise;Balance training;Neuromuscular re-education;Manual techniques;Patient/family education;Dry needling;Energy conservation;Taping;Vasopneumatic Device    PT Next Visit Plan Focus PT on activity tolerance and strengthening to assist pt in moving away from assistance of the 4wrw. Lumbar/core strengthening. Do progress note (all goals have been updated-Epic would not populate smartphrase for progress note)    Consulted and Agree with Plan of Care Patient           Patient will benefit from skilled therapeutic intervention in order to improve the following deficits and impairments:  Abnormal gait,Decreased range of motion,Difficulty walking,Decreased endurance,Increased muscle spasms,Obesity,Decreased activity tolerance,Pain,Decreased balance,Decreased mobility,Decreased strength,Postural dysfunction  Visit Diagnosis: Muscle spasm of back  Acute midline low back pain without sciatica  Other abnormalities of gait and mobility  Abnormal posture  Difficulty in walking, not elsewhere classified  DDD (degenerative disc disease), lumbosacral  Muscle weakness (generalized)  Anterolisthesis     Problem List Patient  Active Problem List   Diagnosis Date Noted  . DJD (degenerative joint disease)   . Sciatica   . Osteoarthritis   . Obstructive sleep apnea 03/11/2020  . Snoring 02/05/2020  . Pneumonia due to COVID-19 virus 12/15/2019  . Acute respiratory failure with hypoxia ([place]) 12/15/2019  . Hypokalemia 12/15/2019  . Asthma without acute exacerbation 12/15/2019  . Chronic pancreatitis ([place])   . Hyperprolactinemia ([place]) 02/15/2018    Damien Cisar, PT 06/05/2020, 9:05 AM  [facility] [address] [place], [place], [address] Phone: [phone]   Fax:  [phone]  Name: Beth  [name] MRN: [id number] Date of Birth: 05/15/1976

## 2020-06-11 ENCOUNTER — Encounter (INDEPENDENT_AMBULATORY_CARE_PROVIDER_SITE_OTHER): Payer: Self-pay | Admitting: Primary Care

## 2020-06-11 ENCOUNTER — Ambulatory Visit (INDEPENDENT_AMBULATORY_CARE_PROVIDER_SITE_OTHER): Payer: BC Managed Care – PPO | Admitting: Endocrinology

## 2020-06-11 ENCOUNTER — Other Ambulatory Visit: Payer: Self-pay

## 2020-06-11 ENCOUNTER — Ambulatory Visit (INDEPENDENT_AMBULATORY_CARE_PROVIDER_SITE_OTHER): Payer: BC Managed Care – PPO | Admitting: Primary Care

## 2020-06-11 ENCOUNTER — Encounter: Payer: Self-pay | Admitting: Endocrinology

## 2020-06-11 VITALS — BP 122/80 | HR 86 | Ht 69.0 in | Wt 270.0 lb

## 2020-06-11 VITALS — BP 110/79 | HR 73 | Temp 97.3°F | Ht 69.0 in | Wt 270.0 lb

## 2020-06-11 DIAGNOSIS — B351 Tinea unguium: Secondary | ICD-10-CM | POA: Diagnosis not present

## 2020-06-11 DIAGNOSIS — K219 Gastro-esophageal reflux disease without esophagitis: Secondary | ICD-10-CM | POA: Diagnosis not present

## 2020-06-11 DIAGNOSIS — K5900 Constipation, unspecified: Secondary | ICD-10-CM | POA: Diagnosis not present

## 2020-06-11 DIAGNOSIS — Z0000 Encounter for general adult medical examination without abnormal findings: Secondary | ICD-10-CM | POA: Diagnosis not present

## 2020-06-11 DIAGNOSIS — E221 Hyperprolactinemia: Secondary | ICD-10-CM

## 2020-06-11 DIAGNOSIS — K8689 Other specified diseases of pancreas: Secondary | ICD-10-CM | POA: Diagnosis not present

## 2020-06-11 DIAGNOSIS — Z800 Family history of malignant neoplasm of digestive organs: Secondary | ICD-10-CM | POA: Diagnosis not present

## 2020-06-11 LAB — TSH: TSH: 0.84 u[IU]/mL (ref 0.35–4.50)

## 2020-06-11 LAB — T4, FREE: Free T4: 0.72 ng/dL (ref 0.60–1.60)

## 2020-06-11 MED ORDER — BUTENAFINE HCL 1 % EX CREA: 1.0000 "application " | TOPICAL_CREAM | Freq: Two times a day (BID) | CUTANEOUS | 1 refills | Status: AC

## 2020-06-11 NOTE — Patient Instructions (Signed)
Health Maintenance, Female Adopting a healthy lifestyle and getting preventive care are important in promoting health and wellness. Ask your health care provider about:  The right schedule for you to have regular tests and exams.  Things you can do on your own to prevent diseases and keep yourself healthy. What should I know about diet, weight, and exercise? Eat a healthy diet   Eat a diet that includes plenty of vegetables, fruits, low-fat dairy products, and lean protein.  Do not eat a lot of foods that are high in solid fats, added sugars, or sodium. Maintain a healthy weight Body mass index (BMI) is used to identify weight problems. It estimates body fat based on height and weight. Your health care provider can help determine your BMI and help you achieve or maintain a healthy weight. Get regular exercise Get regular exercise. This is one of the most important things you can do for your health. Most adults should:  Exercise for at least 150 minutes each week. The exercise should increase your heart rate and make you sweat (moderate-intensity exercise).  Do strengthening exercises at least twice a week. This is in addition to the moderate-intensity exercise.  Spend less time sitting. Even light physical activity can be beneficial. Watch cholesterol and blood lipids Have your blood tested for lipids and cholesterol at 44 years of age, then have this test every 5 years. Have your cholesterol levels checked more often if:  Your lipid or cholesterol levels are high.  You are older than 44 years of age.  You are at high risk for heart disease. What should I know about cancer screening? Depending on your health history and family history, you may need to have cancer screening at various ages. This may include screening for:  Breast cancer.  Cervical cancer.  Colorectal cancer.  Skin cancer.  Lung cancer. What should I know about heart disease, diabetes, and high blood  pressure? Blood pressure and heart disease  High blood pressure causes heart disease and increases the risk of stroke. This is more likely to develop in people who have high blood pressure readings, are of African descent, or are overweight.  Have your blood pressure checked: ? Every 3-5 years if you are 18-39 years of age. ? Every year if you are 40 years old or older. Diabetes Have regular diabetes screenings. This checks your fasting blood sugar level. Have the screening done:  Once every three years after age 40 if you are at a normal weight and have a low risk for diabetes.  More often and at a younger age if you are overweight or have a high risk for diabetes. What should I know about preventing infection? Hepatitis B If you have a higher risk for hepatitis B, you should be screened for this virus. Talk with your health care provider to find out if you are at risk for hepatitis B infection. Hepatitis C Testing is recommended for:  Everyone born from 1945 through 1965.  Anyone with known risk factors for hepatitis C. Sexually transmitted infections (STIs)  Get screened for STIs, including gonorrhea and chlamydia, if: ? You are sexually active and are younger than 44 years of age. ? You are older than 44 years of age and your health care provider tells you that you are at risk for this type of infection. ? Your sexual activity has changed since you were last screened, and you are at increased risk for chlamydia or gonorrhea. Ask your health care provider if   you are at risk.  Ask your health care provider about whether you are at high risk for HIV. Your health care provider may recommend a prescription medicine to help prevent HIV infection. If you choose to take medicine to prevent HIV, you should first get tested for HIV. You should then be tested every 3 months for as long as you are taking the medicine. Pregnancy  If you are about to stop having your period (premenopausal) and  you may become pregnant, seek counseling before you get pregnant.  Take 400 to 800 micrograms (mcg) of folic acid every day if you become pregnant.  Ask for birth control (contraception) if you want to prevent pregnancy. Osteoporosis and menopause Osteoporosis is a disease in which the bones lose minerals and strength with aging. This can result in bone fractures. If you are 65 years old or older, or if you are at risk for osteoporosis and fractures, ask your health care provider if you should:  Be screened for bone loss.  Take a calcium or vitamin D supplement to lower your risk of fractures.  Be given hormone replacement therapy (HRT) to treat symptoms of menopause. Follow these instructions at home: Lifestyle  Do not use any products that contain nicotine or tobacco, such as cigarettes, e-cigarettes, and chewing tobacco. If you need help quitting, ask your health care provider.  Do not use street drugs.  Do not share needles.  Ask your health care provider for help if you need support or information about quitting drugs. Alcohol use  Do not drink alcohol if: ? Your health care provider tells you not to drink. ? You are pregnant, may be pregnant, or are planning to become pregnant.  If you drink alcohol: ? Limit how much you use to 0-1 drink a day. ? Limit intake if you are breastfeeding.  Be aware of how much alcohol is in your drink. In the [place] one drink equals one 12 oz bottle of beer (355 mL), one 5 oz glass of wine (148 mL), or one 1 oz glass of hard liquor (44 mL). General instructions  Schedule regular health, dental, and eye exams.  Stay current with your vaccines.  Tell your health care provider if: ? You often feel depressed. ? You have ever been abused or do not feel safe at home. Summary  Adopting a healthy lifestyle and getting preventive care are important in promoting health and wellness.  Follow your health care provider's instructions about healthy  diet, exercising, and getting tested or screened for diseases.  Follow your health care provider's instructions on monitoring your cholesterol and blood pressure. This information is not intended to replace advice given to you by your health care provider. Make sure you discuss any questions you have with your health care provider. Document Revised: 06/05/2018 Document Reviewed: 06/05/2018 Elsevier Patient Education  2020 Elsevier Inc.  

## 2020-06-11 NOTE — Patient Instructions (Addendum)
Blood tests are requested for you today.  We'll let you know about the results.   °If the thyroid is just a tiny bit off, we won't bother with medication now.    °Please come back for a follow-up appointment in 1 year.  ° °

## 2020-06-11 NOTE — Progress Notes (Signed)
Subjective:    Patient ID: Beth [name], female    DOB: 09/09/1975, 44 y.o.   MRN: [id number]  HPI Pt returns for f/u of hyperprolactinemia (dx'ed 2013, when she presented with galactorrhea; she has never had pituitary imaging; she has had no menses since 2012.  She is G4P3; she takes chronic phenergan, for nausea (chronic pancreatitis; she has Mirena IUD).  pt states she feels well in general.   She was found to have suppressed TSH (no h/o thyroid probs; chest CT in 2021 showed normal medistinal thyroid).   Past Medical History:  Diagnosis Date  . Asthma   . Chronic pancreatitis ([place])   . DDD (degenerative disc disease), lumbosacral   . DJD (degenerative joint disease)   . Hyperlipidemia   . Hypertension   . Hypoglycemia   . Obesity   . Osteoarthritis   . Prolactinoma ([place])   . Sciatica     Past Surgical History:  Procedure Laterality Date  . CHOLECYSTECTOMY    . COLONOSCOPY      Social History   Socioeconomic History  . Marital status: Divorced    Spouse name: Not on file  . Number of children: Not on file  . Years of education: Not on file  . Highest education level: Not on file  Occupational History  . Not on file  Tobacco Use  . Smoking status: Never Smoker  . Smokeless tobacco: Never Used  Vaping Use  . Vaping Use: Never used  Substance and Sexual Activity  . Alcohol use: Never  . Drug use: Never  . Sexual activity: Yes  Other Topics Concern  . Not on file  Social History Narrative  . Not on file   Social Determinants of Health   Financial Resource Strain: Not on file  Food Insecurity: Not on file  Transportation Needs: Not on file  Physical Activity: Not on file  Stress: Not on file  Social Connections: Not on file  Intimate Partner Violence: Not on file    Current Outpatient Medications on File Prior to Visit  Medication Sig Dispense Refill  . albuterol (PROAIR HFA) 108 (90 Base) MCG/ACT inhaler Inhale 1 puff into the lungs every 4 (four)  hours as needed for wheezing or shortness of breath. 6.7 g 0  . atorvastatin (LIPITOR) 10 MG tablet Take 1 tablet (10 mg total) by mouth daily. 90 tablet 2  . baclofen (LIORESAL) 10 MG tablet Take 0.5-1 tablets (5-10 mg total) by mouth 3 (three) times daily as needed for muscle spasms. 30 each 3  . benzonatate (TESSALON) 200 MG capsule Take 1 capsule (200 mg total) by mouth 3 (three) times daily as needed for cough. 30 capsule 3  . chlorpheniramine-HYDROcodone (TUSSIONEX PENNKINETIC ER) 10-8 MG/5ML SUER Take 5 mLs by mouth every 12 (twelve) hours as needed for cough. 120 mL 0  . EPINEPHrine 0.3 mg/0.3 mL IJ SOAJ injection Inject 0.3 mg into the muscle once.     . hydrochlorothiazide (HYDRODIURIL) 12.5 MG tablet Take 1 tablet (12.5 mg total) by mouth daily. 90 tablet 1  . levonorgestrel (MIRENA) 20 MCG/24HR IUD 1 each by Intrauterine route once.     . nabumetone (RELAFEN) 500 MG tablet Take 1 tablet (500 mg total) by mouth 2 (two) times daily as needed. 60 tablet 3  . omega-3 fish oil (MAXEPA) 1000 MG CAPS capsule Take 2 capsules by mouth daily.     . Pancrelipase, Lip-Prot-Amyl, (CREON) 24000-76000 units CPEP Take 2 capsules by mouth See admin instructions.  Take 2 capsules three times daily with food and 1 capsule twice daily with snacks.    . pantoprazole (PROTONIX) 20 MG tablet Take 20 mg by mouth daily.    Marland Kitchen tiZANidine (ZANAFLEX) 4 MG tablet Take 1 tablet (4 mg total) by mouth every 8 (eight) hours as needed for muscle spasms. 90 tablet 1  . Zinc Sulfate (ZINC 15 PO) Take 1 tablet by mouth daily.     No current facility-administered medications on file prior to visit.    Allergies  Allergen Reactions  . [place] Swelling  . [place] Grass Hives  . Cedar Swelling  . Cladosporium Cladosporioides Hives  . Dust Mite Extract Hives  . Elm Bark [Ulmus Fulva] Swelling  . Lidocaine Hcl Swelling  . Molds & Smuts Hives  . Mugwort Hives  . Nettle [Urtica Dioica] Hives  . Sorrel-Dock Mix [Sheep  Sorrel-Yellow Dock] Hives  . Timothy Grass Pollen Allergen Hives  . Xylocaine [Lidocaine] Swelling    Family History  Problem Relation Age of Onset  . Other Paternal Aunt        pituitary surgery  . Hypertension Mother   . Diabetes Mother   . Colon cancer Mother   . Arthritis Mother   . Thyroid disease Mother   . Asthma Mother   . Diabetes Father   . Hypertension Father   . Bell's palsy Father     BP 122/80   Pulse 86   Ht 5\' 9"  (1.753 m)   Wt 270 lb (122.5 kg)   SpO2 97%   BMI 39.87 kg/m    Review of Systems     Objective:   Physical Exam VITAL SIGNS:  See vs page GENERAL: no distress NECK: There is no palpable thyroid enlargement.  No thyroid nodule is palpable.  No palpable lymphadenopathy at the anterior neck.    Prolactin=32      Assessment & Plan:  Hyperprolactinemia, uncontrolled. I have sent a prescription to your pharmacy, to increase parlodel Hyperthyroidism: recheck today

## 2020-06-11 NOTE — Progress Notes (Signed)
Beth [name] is a 44 y.o. female presents to office today for annual physical exam examination.   righ Concerns today include: 1. Seen in Urgent care and referred to urology appointment 06/23/20 2. Right toe fungus - hx of big toenail removal    Occupation: Administrator , Marital status: S, Substance use: no  Diet: no , Exercise: walking Last eye exam: 03/21 Last dental exam: 05/21 Last mammogram:  Last pap smear: schedule with GYN next year  Refills needed today: yes  Immunizations needed: Flu Vaccine: no  Tdap Vaccine: yes  - every 26yrs - (<3 lifetime doses or unknown): all wounds -- look up need for Tetanus IG - (>=3 lifetime doses): clean/minor wound if >81yrs from previous; all other wounds if >13yrs from previous Zoster Vaccine: no (those >50yo, once) Pneumonia Vaccine: no (those w/ risk factors) - (<37yr) Both: Immunocompromised, cochlear implant, CSF leak, asplenic, sickle cell, Chronic Renal Failure - (<32yr) PPSV-23 only: Heart dz, lung disease, DM, tobacco abuse, alcoholism, cirrhosis/liver disease. - (>51yr): PPSV13 then PPSV23 in 6-12mths;  - (>85yr): repeat PPSV23 once if pt received prior to 44yo and 82yrs have passed  Past Medical History:  Diagnosis Date   Asthma    Chronic pancreatitis ([place])    DDD (degenerative disc disease), lumbosacral    DJD (degenerative joint disease)    Hyperlipidemia    Hypertension    Hypoglycemia    Obesity    Osteoarthritis    Prolactinoma ([place])    Sciatica    Social History   Socioeconomic History   Marital status: Divorced    Spouse name: Not on file   Number of children: Not on file   Years of education: Not on file   Highest education level: Not on file  Occupational History   Not on file  Tobacco Use   Smoking status: Never Smoker   Smokeless tobacco: Never Used  Vaping Use   Vaping Use: Never used  Substance and Sexual Activity   Alcohol use: Never   Drug use: Never   Sexual activity:  Yes  Other Topics Concern   Not on file  Social History Narrative   Not on file   Social Determinants of Health   Financial Resource Strain: Not on file  Food Insecurity: Not on file  Transportation Needs: Not on file  Physical Activity: Not on file  Stress: Not on file  Social Connections: Not on file  Intimate Partner Violence: Not on file   Past Surgical History:  Procedure Laterality Date   CHOLECYSTECTOMY     COLONOSCOPY     Family History  Problem Relation Age of Onset   Other Paternal Aunt        pituitary surgery   Hypertension Mother    Diabetes Mother    Colon cancer Mother    Arthritis Mother    Thyroid disease Mother    Asthma Mother    Diabetes Father    Hypertension Father    Bell's palsy Father     Current Outpatient Medications:    albuterol (PROAIR HFA) 108 (90 Base) MCG/ACT inhaler, Inhale 1 puff into the lungs every 4 (four) hours as needed for wheezing or shortness of breath., Disp: 6.7 g, Rfl: 0   atorvastatin (LIPITOR) 10 MG tablet, Take 1 tablet (10 mg total) by mouth daily., Disp: 90 tablet, Rfl: 2   baclofen (LIORESAL) 10 MG tablet, Take 0.5-1 tablets (5-10 mg total) by mouth 3 (three) times daily as needed for muscle spasms., Disp:  30 each, Rfl: 3   benzonatate (TESSALON) 200 MG capsule, Take 1 capsule (200 mg total) by mouth 3 (three) times daily as needed for cough., Disp: 30 capsule, Rfl: 3   bromocriptine (PARLODEL) 2.5 MG tablet, TAKE 1/2 TABLET(1.25 MG) BY MOUTH AT BEDTIME, Disp: 45 tablet, Rfl: 0   chlorpheniramine-HYDROcodone (TUSSIONEX PENNKINETIC ER) 10-8 MG/5ML SUER, Take 5 mLs by mouth every 12 (twelve) hours as needed for cough., Disp: 120 mL, Rfl: 0   EPINEPHrine 0.3 mg/0.3 mL IJ SOAJ injection, Inject 0.3 mg into the muscle once. , Disp: , Rfl:    hydrochlorothiazide (HYDRODIURIL) 12.5 MG tablet, Take 1 tablet (12.5 mg total) by mouth daily., Disp: 90 tablet, Rfl: 1   levonorgestrel (MIRENA) 20 MCG/24HR IUD,  1 each by Intrauterine route once. , Disp: , Rfl:    nabumetone (RELAFEN) 500 MG tablet, Take 1 tablet (500 mg total) by mouth 2 (two) times daily as needed., Disp: 60 tablet, Rfl: 3   naftifine (NAFTIN) 1 % cream, Apply 1 application topically daily as needed (fungs infection). , Disp: , Rfl:    omega-3 fish oil (MAXEPA) 1000 MG CAPS capsule, Take 2 capsules by mouth daily. , Disp: , Rfl:    Pancrelipase, Lip-Prot-Amyl, (CREON) 24000-76000 units CPEP, Take 2 capsules by mouth See admin instructions. Take 2 capsules three times daily with food and 1 capsule twice daily with snacks., Disp: , Rfl:    pantoprazole (PROTONIX) 20 MG tablet, Take 20 mg by mouth daily., Disp: , Rfl:    tiZANidine (ZANAFLEX) 4 MG tablet, Take 1 tablet (4 mg total) by mouth every 8 (eight) hours as needed for muscle spasms., Disp: 90 tablet, Rfl: 1   Zinc Sulfate (ZINC 15 PO), Take 1 tablet by mouth daily., Disp: , Rfl:   Allergies  Allergen Reactions   Congo Swelling   [place] Grass Hives   Cedar Swelling   Cladosporium Cladosporioides Hives   Dust Mite Extract Hives   Elm Bark [Ulmus Fulva] Swelling   Lidocaine Hcl Swelling   Molds & Smuts Hives   Mugwort Hives   Nettle [place] Dioica] Hives   Sorrel-Dock Mix [Sheep Sorrel-Yellow Dock] Hives   Timothy Grass Pollen Allergen Hives   Xylocaine [Lidocaine] Swelling     ROS: Review of Systems Pertinent items noted in HPI and remainder of comprehensive ROS otherwise negative.    Physical exam Vitals:   06/11/20 1121  BP: 110/79  Pulse: 73  Temp: (!) 97.3 F (36.3 C)  TempSrc: Temporal  SpO2: 96%  Weight: 270 lb (122.5 kg)  Height: 5\' 9"  (1.753 m)   General: Vital signs reviewed.  Patient is well-developed and well-nourished, in no acute distress and cooperative with exam.  Head: Normocephalic and atraumatic. Eyes: EOMI, conjunctivae normal, no scleral icterus.  Neck: Supple, trachea midline, normal ROM, no JVD, masses, thyromegaly,  or carotid bruit present.  Cardiovascular: RRR, S1 normal, S2 normal, no murmurs, gallops, or rubs. Pulmonary/Chest: Clear to auscultation bilaterally, no wheezes, rales, or rhonchi. Abdominal: Soft, non-tender, non-distended, BS +, no masses, organomegaly, or guarding present.  Musculoskeletal: No joint deformities, erythema, or stiffness, ROM full and nontender. Extremities: No lower extremity edema bilaterally,  pulses symmetric and intact bilaterally. No cyanosis or clubbing. Neurological: A&O x3, Strength is normal and symmetric bilaterally, cranial nerve II-XII are grossly intact, no focal motor deficit, sensory intact to light touch bilaterally.  Skin: Warm, dry and intact. No rashes or erythema. Psychiatric: Normal mood and affect. speech and behavior is normal. Cognition  and memory are normal.   Assessment/ Plan: Irma Newness here for annual physical exam.   No problem-specific Assessment & Plan notes found for this encounter.   Counseled on healthy lifestyle choices, including diet (rich in fruits, vegetables and lean meats and low in salt and simple carbohydrates) and exercise (at least 30 minutes of moderate physical activity daily).  Patient to follow up in 1 year for annual exam or sooner if needed.  The above assessment and management plan was discussed with the patient. The patient verbalized understanding of and has agreed to the management plan. Patient is aware to call the clinic if symptoms persist or worsen. Patient is aware when to return to the clinic for a follow-up visit. Patient educated on when it is appropriate to go to the emergency department.   [name] NP-C [address] [facility] [phone]

## 2020-06-12 ENCOUNTER — Other Ambulatory Visit: Payer: Self-pay | Admitting: Endocrinology

## 2020-06-12 ENCOUNTER — Ambulatory Visit: Payer: BC Managed Care – PPO

## 2020-06-12 DIAGNOSIS — M545 Low back pain, unspecified: Secondary | ICD-10-CM | POA: Diagnosis not present

## 2020-06-12 DIAGNOSIS — M6281 Muscle weakness (generalized): Secondary | ICD-10-CM | POA: Diagnosis not present

## 2020-06-12 DIAGNOSIS — M62830 Muscle spasm of back: Secondary | ICD-10-CM

## 2020-06-12 DIAGNOSIS — M4310 Spondylolisthesis, site unspecified: Secondary | ICD-10-CM

## 2020-06-12 DIAGNOSIS — R262 Difficulty in walking, not elsewhere classified: Secondary | ICD-10-CM | POA: Diagnosis not present

## 2020-06-12 DIAGNOSIS — M5137 Other intervertebral disc degeneration, lumbosacral region: Secondary | ICD-10-CM

## 2020-06-12 DIAGNOSIS — R293 Abnormal posture: Secondary | ICD-10-CM

## 2020-06-12 DIAGNOSIS — R2689 Other abnormalities of gait and mobility: Secondary | ICD-10-CM

## 2020-06-12 LAB — PROLACTIN: Prolactin: 32.1 ng/mL — ABNORMAL HIGH

## 2020-06-12 MED ORDER — BROMOCRIPTINE MESYLATE 2.5 MG PO TABS
2.5000 mg | ORAL_TABLET | Freq: Every day | ORAL | 3 refills | Status: DC
Start: 2020-06-12 — End: 2021-06-30

## 2020-06-13 NOTE — Therapy (Signed)
[place], [place], [address] Phone: [phone]   Fax:  [phone]  Physical Therapy Treatment  Patient Details  Name: Beth [name] MRN: [id number] Date of Birth: 05/31/1976 Referring Provider (PT): [name], NP   Encounter Date: 06/12/2020    Past Medical History:  Diagnosis Date   Asthma    Chronic pancreatitis ([place])    DDD (degenerative disc disease), lumbosacral    DJD (degenerative joint disease)    Hyperlipidemia    Hypertension    Hypoglycemia    Obesity    Osteoarthritis    Prolactinoma ([place])    Sciatica     Past Surgical History:  Procedure Laterality Date   CHOLECYSTECTOMY     COLONOSCOPY      There were no vitals filed for this visit.   Subjective Assessment - 06/13/20 2157    Subjective Pt reports low back pain of a low level c as she moves with her low back out of a neutral position.    Pertinent History Back issue 2012 received PT and was told the issue appeared to be disc related. Covid, 6/21; obesity    Limitations Sitting;Lifting;Standing;Walking;House hold activities    Diagnostic tests MRI scheduled for 10/2. X-rays 9/8: X-rays lumbar spine reveal cholecystectomy surgical clips. She has a grade 1 anterolisthesis of L4 on L5. She has mild to moderate degenerative disc disease at L5-S1. 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    Patient Stated Goals To be active with less pain s medications    Currently in Pain? Yes    Pain Score 1     Pain Location Back    Pain Descriptors / Indicators Aching;Tightness    Pain Type Chronic pain    Pain Onset More than a month ago    Pain Frequency Intermittent    Aggravating Factors  walking. standing    Pain Relieving Factors rest                             [facility] Adult PT Treatment/Exercise - 06/13/20 0001      Ambulation/Gait   Gait Comments 2MWT 473ft, RPE-Somewhat Hard.; pursed  lip breathing for recovery      Lumbar Exercises: Standing   Other Standing Lumbar Exercises Standing c forward hinged hips c core engaaged; alt arm lifts 10x; alt legs lifts 10x; alt arm/leg lifts 10x      Lumbar Exercises: Quadruped   Other Quadruped Lumbar Exercises Arm lifts, mule kicks 10x each    Other Quadruped Lumbar Exercises On physioball, 6 arm lifts, 6 trunk lifts. Pt did not tolerate the pressure of the physioball and exs were discontinued.                  PT Education - 06/13/20 2213    Education Details HEP    Person(s) Educated Patient    Methods Explanation;Demonstration;Tactile cues;Verbal cues;Handout;Other (comment)    Comprehension Verbalized understanding;Returned demonstration;Verbal cues required;Tactile cues required;Need further instruction            PT Short Term Goals - 05/29/20 0829      PT SHORT TERM GOAL #1   Title Pt wll be ind in a final HEP. Achieved      PT SHORT TERM GOAL #2   Title Pt will voice understanding of measures to assist in pain reduction and management. Achieved  PT Long Term Goals - 06/05/20 0839      PT LONG TERM GOAL #1   Title Improved trunk ROM to at least 75% of normal for all movements. Achieved      PT LONG TERM GOAL #2   Title Pt's will report improved ability to complete usual work and daily activities to a little difficulty. On-going- Moderate difficulty    Status On-going      PT LONG TERM GOAL #3   Title Pt will report improved ability to stand from a chair to no difficulty. On-going- moderate difficulty    Status On-going      PT LONG TERM GOAL #4   Title pt's FOTO survey score will improved to the predicted value of 52% limitation. On-going    Status On-going      PT LONG TERM GOAL #5   Title Pt will be ind in a final HEP. On-going    Status On-going                 Plan - 06/13/20 2229    Clinical Impression Statement Pt demonstrated improved walking pace and activity  tolerance with the 2MWT today. Pace improved from 2.25 to 3.5 ft/sce. PT was provided for trunk and core strengthening for stability to the low back. Pt tolerated these exs best in standing. Pt will benefit from PT to address trunk stability, balance and tolerance to activity.    Personal Factors and Comorbidities Comorbidity 2;Fitness    Comorbidities Deconditioning related to covid; obesity    Examination-Activity Limitations Bed Mobility;Continence;Lift;Stand;Stairs;Squat;Sleep;Sit;Locomotion Level    Stability/Clinical Decision Making Evolving/Moderate complexity    Clinical Decision Making Moderate    Rehab Potential Good    PT Frequency 2x / week    PT Duration 6 weeks    PT Treatment/Interventions ADLs/Self Care Home Management;Cryotherapy;Electrical Stimulation;Ultrasound;Traction;Moist Heat;Iontophoresis 4mg /ml Dexamethasone;Gait training;Stair training;Functional mobility training;Therapeutic activities;Therapeutic exercise;Balance training;Neuromuscular re-education;Manual techniques;Patient/family education;Dry needling;Energy conservation;Taping;Vasopneumatic Device    PT Next Visit Plan Focus PT on activity tolerance and strengthening to assist pt in moving away from assistance of the 4wrw. Lumbar/core strengthening. Do progress note (all goals have been updated-Epic would not populate smartphrase for progress note)    PT Home Exercise Plan [id number]    Consulted and Agree with Plan of Care Patient           Patient will benefit from skilled therapeutic intervention in order to improve the following deficits and impairments:  Abnormal gait,Decreased range of motion,Difficulty walking,Decreased endurance,Increased muscle spasms,Obesity,Decreased activity tolerance,Pain,Decreased balance,Decreased mobility,Decreased strength,Postural dysfunction  Visit Diagnosis: Muscle spasm of back  Acute midline low back pain without sciatica  Other abnormalities of gait and  mobility  Abnormal posture  Difficulty in walking, not elsewhere classified  DDD (degenerative disc disease), lumbosacral  Muscle weakness (generalized)  Anterolisthesis     Problem List Patient Active Problem List   Diagnosis Date Noted   DJD (degenerative joint disease)    Sciatica    Osteoarthritis    Obstructive sleep apnea 03/11/2020   Snoring 02/05/2020   Pneumonia due to COVID-19 virus 12/15/2019   Acute respiratory failure with hypoxia ([place]) 12/15/2019   Hypokalemia 12/15/2019   Asthma without acute exacerbation 12/15/2019   Chronic pancreatitis ([place])    Hyperprolactinemia ([place]) 02/15/2018    Gar Ponto MS, PT 06/13/20 10:49 PM  [facility] [address] [place], [place], [address] Phone: [phone]   Fax:  [phone]  Name: Beth [name] MRN: [id number] Date of Birth: Nov 08, 1975

## 2020-06-13 NOTE — Patient Instructions (Signed)
°  Alt arms, legs and arm/legs

## 2020-06-14 ENCOUNTER — Telehealth (INDEPENDENT_AMBULATORY_CARE_PROVIDER_SITE_OTHER): Payer: Self-pay | Admitting: Primary Care

## 2020-06-14 NOTE — Telephone Encounter (Signed)
Sent to PCP ?

## 2020-06-14 NOTE — Telephone Encounter (Signed)
Pt states she was in office last Friday, and Sharyn Lull had told her to call back today to remind her to get someone to find the handi cap parking forms

## 2020-06-15 ENCOUNTER — Ambulatory Visit: Payer: BC Managed Care – PPO

## 2020-06-15 ENCOUNTER — Other Ambulatory Visit: Payer: Self-pay

## 2020-06-15 DIAGNOSIS — M545 Low back pain, unspecified: Secondary | ICD-10-CM | POA: Diagnosis not present

## 2020-06-15 DIAGNOSIS — M6281 Muscle weakness (generalized): Secondary | ICD-10-CM | POA: Diagnosis not present

## 2020-06-15 DIAGNOSIS — M62830 Muscle spasm of back: Secondary | ICD-10-CM | POA: Diagnosis not present

## 2020-06-15 DIAGNOSIS — R293 Abnormal posture: Secondary | ICD-10-CM

## 2020-06-15 DIAGNOSIS — R262 Difficulty in walking, not elsewhere classified: Secondary | ICD-10-CM | POA: Diagnosis not present

## 2020-06-15 DIAGNOSIS — M4310 Spondylolisthesis, site unspecified: Secondary | ICD-10-CM

## 2020-06-15 DIAGNOSIS — R2689 Other abnormalities of gait and mobility: Secondary | ICD-10-CM

## 2020-06-15 DIAGNOSIS — M5137 Other intervertebral disc degeneration, lumbosacral region: Secondary | ICD-10-CM

## 2020-06-15 NOTE — Therapy (Signed)
[place], [place], [address] Phone: [phone]   Fax:  [phone]  Physical Therapy Treatment  Patient Details  Name: Beth [name] MRN: [id number] Date of Birth: [birth year] Referring Provider (PT): [name], NP   Encounter Date: 06/15/2020   PT End of Session - 06/15/20 1835    Visit Number 13    Number of Visits 24    Date for PT Re-Evaluation 07/10/20    Authorization Type BCBS COMM PPO    Progress Note Due on Visit 10    PT Start Time 8341    PT Stop Time 1917    PT Time Calculation (min) 43 min    Equipment Utilized During Treatment Other (comment)   rollar   Activity Tolerance Patient tolerated treatment well;No increased pain    Behavior During Therapy WFL for tasks assessed/performed           Past Medical History:  Diagnosis Date  . Asthma   . Chronic pancreatitis ([place])   . DDD (degenerative disc disease), lumbosacral   . DJD (degenerative joint disease)   . Hyperlipidemia   . Hypertension   . Hypoglycemia   . Obesity   . Osteoarthritis   . Prolactinoma ([place])   . Sciatica     Past Surgical History:  Procedure Laterality Date  . CHOLECYSTECTOMY    . COLONOSCOPY      There were no vitals filed for this visit.   Subjective Assessment - 06/15/20 1839    Subjective Pt reports she went to a wedding Saturday which involved alot standing. Pt states she completed wt shifting and pelvic tilts to manage low back pressure/aching, She also reports taking took pain med prior to going. Pt statesshe wold not have considered trying that a month ago.Pt reports her mobility has been better the past 2 days.    Pertinent History Back issue 2012 received PT and was told the issue appeared to be disc related. Covid, 6/21; obesity    Limitations Sitting;Lifting;Standing;Walking;House hold activities    Diagnostic tests MRI scheduled for 10/2. X-rays 9/8: X-rays lumbar spine reveal  cholecystectomy surgical clips. She has a grade 1 anterolisthesis of L4 on L5. She has mild to moderate degenerative disc disease at L5-S1. 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    Patient Stated Goals To be active with less pain s medications    Currently in Pain? Yes    Pain Score 1     Pain Location Back    Pain Orientation Lower    Pain Descriptors / Indicators Aching;Tightness    Pain Type Chronic pain    Pain Onset More than a month ago    Pain Frequency Intermittent    Multiple Pain Sites No              [facility] PT Assessment - 06/15/20 0001      6 minute walk test results    Aerobic Endurance Distance Walked 890   rollator, post walk O2 sat 99%, HR 99, 2.5 ft/sec, RPE 13                        [facility] Adult PT Treatment/Exercise - 06/15/20 0001      Lumbar Exercises: Standing   Row 15 reps    Theraband Level (Row) Level 3 (Green)    Shoulder Extension 15 reps    Theraband Level (Shoulder Extension) Level 3 (Green)    Other  Standing Lumbar Exercises Standing c forward hinged hips c core engaaged; alt arm lifts 10x; alt legs lifts 10x; alt arm/leg lifts 10x    Other Standing Lumbar Exercises Palloff press 10x each direction      Lumbar Exercises: Seated   Other Seated Lumbar Exercises Seated PPT c UE doenward pressure on _____ aial tall standing                    PT Short Term Goals - 05/29/20 0829      PT SHORT TERM GOAL #1   Title Pt wll be ind in a final HEP. Achieved      PT SHORT TERM GOAL #2   Title Pt will voice understanding of measures to assist in pain reduction and management. Achieved             PT Long Term Goals - 06/05/20 0839      PT LONG TERM GOAL #1   Title Improved trunk ROM to at least 75% of normal for all movements. Achieved      PT LONG TERM GOAL #2   Title Pt's will report improved ability to complete usual work and daily activities to a little difficulty. On-going- Moderate difficulty    Status On-going       PT LONG TERM GOAL #3   Title Pt will report improved ability to stand from a chair to no difficulty. On-going- moderate difficulty    Status On-going      PT LONG TERM GOAL #4   Title pt's FOTO survey score will improved to the predicted value of 52% limitation. On-going    Status On-going      PT LONG TERM GOAL #5   Title Pt will be ind in a final HEP. On-going    Status On-going                 Plan - 06/15/20 1836    Clinical Impression Statement Following a 6 MWT, pt reported a RPE of 13-Somewhat Hard. Pt was DOE. HR was elevated, while O2 sat remained high at 99%. During PT sessions pt has a tendency to become DOE.Continued core strengtheningfor lumbar stabilization. With attending a wedding, pt demonstrated appropriate adaption to manage her symptoms of low back pain and DOE    Personal Factors and Comorbidities Comorbidity 2;Fitness    Comorbidities Deconditioning related to covid; obesity    Examination-Activity Limitations Bed Mobility;Continence;Lift;Stand;Stairs;Squat;Sleep;Sit;Locomotion Level    Stability/Clinical Decision Making Evolving/Moderate complexity    Clinical Decision Making Moderate    Rehab Potential Good    PT Frequency 2x / week    PT Duration 6 weeks    PT Treatment/Interventions ADLs/Self Care Home Management;Cryotherapy;Electrical Stimulation;Ultrasound;Traction;Moist Heat;Iontophoresis 4mg /ml Dexamethasone;Gait training;Stair training;Functional mobility training;Therapeutic activities;Therapeutic exercise;Balance training;Neuromuscular re-education;Manual techniques;Patient/family education;Dry needling;Energy conservation;Taping;Vasopneumatic Device    PT Next Visit Plan Focus PT on activity tolerance and strengthening to assist pt in moving away from assistance of the 4wrw. Lumbar/core strengthening. Progress gait away from rolator as tolerated.    PT Home Exercise Plan [id number]    Consulted and Agree with Plan of Care Patient            Patient will benefit from skilled therapeutic intervention in order to improve the following deficits and impairments:  Abnormal gait,Decreased range of motion,Difficulty walking,Decreased endurance,Increased muscle spasms,Obesity,Decreased activity tolerance,Pain,Decreased balance,Decreased mobility,Decreased strength,Postural dysfunction  Visit Diagnosis: Muscle spasm of back  Acute midline low back pain without sciatica  Other abnormalities of gait and mobility  Abnormal posture  Difficulty in walking, not elsewhere classified  DDD (degenerative disc disease), lumbosacral  Muscle weakness (generalized)  Anterolisthesis     Problem List Patient Active Problem List   Diagnosis Date Noted  . DJD (degenerative joint disease)   . Sciatica   . Osteoarthritis   . Obstructive sleep apnea 03/11/2020  . Snoring 02/05/2020  . Pneumonia due to COVID-19 virus 12/15/2019  . Acute respiratory failure with hypoxia ([place]) 12/15/2019  . Hypokalemia 12/15/2019  . Asthma without acute exacerbation 12/15/2019  . Chronic pancreatitis ([place])   . Hyperprolactinemia ([place]) 02/15/2018    Gar Ponto MS, PT 06/15/20 10:20 PM  [place] [facility] [address] [place], [place], [address] Phone: [phone]   Fax:  [phone]  Name: Beth [name] MRN: [id number] Date of Birth: [birth year]

## 2020-06-16 ENCOUNTER — Encounter: Payer: Self-pay | Admitting: Emergency Medicine

## 2020-06-16 ENCOUNTER — Ambulatory Visit (INDEPENDENT_AMBULATORY_CARE_PROVIDER_SITE_OTHER): Payer: BC Managed Care – PPO | Admitting: Emergency Medicine

## 2020-06-16 ENCOUNTER — Ambulatory Visit (INDEPENDENT_AMBULATORY_CARE_PROVIDER_SITE_OTHER): Payer: BC Managed Care – PPO

## 2020-06-16 VITALS — BP 114/84 | HR 64 | Ht 69.0 in | Wt 273.0 lb

## 2020-06-16 DIAGNOSIS — U099 Post covid-19 condition, unspecified: Secondary | ICD-10-CM

## 2020-06-16 DIAGNOSIS — J45909 Unspecified asthma, uncomplicated: Secondary | ICD-10-CM | POA: Diagnosis not present

## 2020-06-16 DIAGNOSIS — R918 Other nonspecific abnormal finding of lung field: Secondary | ICD-10-CM | POA: Diagnosis not present

## 2020-06-16 DIAGNOSIS — G4733 Obstructive sleep apnea (adult) (pediatric): Secondary | ICD-10-CM

## 2020-06-16 DIAGNOSIS — U071 COVID-19: Secondary | ICD-10-CM | POA: Diagnosis not present

## 2020-06-16 DIAGNOSIS — J90 Pleural effusion, not elsewhere classified: Secondary | ICD-10-CM | POA: Diagnosis not present

## 2020-06-16 DIAGNOSIS — J1282 Pneumonia due to coronavirus disease 2019: Secondary | ICD-10-CM | POA: Diagnosis not present

## 2020-06-16 DIAGNOSIS — J189 Pneumonia, unspecified organism: Secondary | ICD-10-CM | POA: Diagnosis not present

## 2020-06-16 NOTE — Assessment & Plan Note (Signed)
New diagnosis.  She started CPAP since last time.  She has great compliance.  She has benefited significantly.  Better energy, better sleep quality, less daytime sleepiness

## 2020-06-16 NOTE — Progress Notes (Signed)
Subjective:    Patient ID: Beth [name], female    DOB: 04/06/1976, 44 y.o.   MRN: [id number]  HPI 44 year old woman, never smoker, with a history of chronic pancreatitis, childhood asthma that has become active again as an adult when she was pregnant in 2000, seen initially in June 2021 when she was admitted with COVID-19 pneumonia.  Note was made on a CT angio chest 12/15/2019 that she had no evidence for PE but there was marked severe bilateral diffuse infiltrates consistent with a viral pneumonia.  Also mention was 4.4 x 3.1 cm heterogeneous soft tissue mass along the right paratracheal region. Has remained on Dulera, rarely uses albuterol. She snores. Not well-rested in the am, she takes naps. She has fallen asleep accidentally before, evan at a stoplight.   She is significantly better since discharge, still using O2. Has some dyspnea with a lot of speaking, with steady exertion. Has not really increased her activity yet.   CT chest 01/13/2020 reviewed by me, shows large-scale resolution of her bilateral infiltrates with residual peripheral scar and no further groundglass.  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  ROV 03/11/20 --follow-up visit for patient with a history of asthma, never smoker.  She had COVID-19 pneumonia in 11/2019.  I met her a month ago and we did a trial of stopping her Dulera to see if she would miss it.  I had also hoped to get pulmonary function testing but she actually cannot do this until she is 90 days out from her Covid diagnosis. She tolerated stopping Dulera, has not needed any albuterol. Her cough is much better.  She has had snoring and daytime sleepiness so we performed PSG as below.  PSG from 02/29/2020, data reviewed, shows an AHI 19.3/h without any central apneas.  43 hypopneas.  All worse during REM sleep.  No abnormal limb movements.  She was not titrated on CPAP.  ROV 06/16/20  --Beth [name] is 44, follows up today for her asthma and her newly diagnosed obstructive sleep apnea.  At her last visit in September we set her up for an AutoSet CPAP.  She reports that she has used the CPAP and feels better with it. She forgot to wear on one occasion and did not feel rested. Her naps have decreased in frequency, more awake and energetic during the day. She is working with PT. Still some SOB with walking and talking. Has not needed or used albuterol. Occasional cough, some allergy congestion.   Pulmonary function testing was done 03/22/2020 and I have reviewed, shows principally restriction with possible superimposed obstruction, no bronchodilator response.  The lung volumes confirm restriction.  Diffusion capacity is normal.  Airview download is available for review from 11/22-12/21/2021.  This shows great compliance with her CPAP, 97% of the days used more than 4 hours, 1 day she used it for less than 4 hours.  Median pressure is 8 cmH2O.  Minimal leak and great control of events.  MDM: Reviewed cardiology note 04/01/2020 Reviewed internal medicine note 06/11/2020  Review of Systems As per HPI      Objective:   Physical Exam Vitals:   06/16/20 1534  BP: 114/84  Pulse: 64  SpO2: 97%  Weight: 273 lb (123.8 kg)  Height: _0  (1.753 m)   Gen: Pleasant, well-nourished, in no distress,  normal affect  ENT: No lesions,  mouth clear,  oropharynx clear, no postnasal drip  Neck: No JVD, no stridor  Lungs: No use of accessory muscles, no wheeze or crackles.   Cardiovascular: RRR, heart sounds normal, no murmur or gallops, no peripheral edema  Musculoskeletal: No deformities, no cyanosis or clubbing  Neuro: alert, awake, non focal  Skin: Warm, no lesions or rash      Assessment & Plan:  Pneumonia due to COVID-19 virus With residual interstitial infiltrates on her most recent chest x-ray from July.  Plan to repeat her chest x-ray today to look for interval  improvement.  If she has residual interstitial changes then we may decide to repeat her CT chest as well.  Obstructive sleep apnea New diagnosis.  She started CPAP since last time.  She has great compliance.  She has benefited significantly.  Better energy, better sleep quality, less daytime sleepiness  Asthma without acute exacerbation PFT consistent with mixed disease, suspect the restriction is the most significant impact on her dyspnea, due to deconditioning and obesity.  Consider also some residual interstitial change post Covid pneumonitis.  Keep albuterol available to use 2 puffs if needed   Baltazar Apo, MD, PhD 06/16/2020, 3:53 PM [facility] [phone] or if no answer [phone]

## 2020-06-16 NOTE — Assessment & Plan Note (Signed)
With residual interstitial infiltrates on her most recent chest x-ray from July.  Plan to repeat her chest x-ray today to look for interval improvement.  If she has residual interstitial changes then we may decide to repeat her CT chest as well.

## 2020-06-16 NOTE — Patient Instructions (Addendum)
Please continue to wear your CPAP reliably every night. Chest x-ray today Keep albuterol available to use 2 puffs if needed for shortness of breath, chest tightness, wheezing. Follow with Dr Lamonte Sakai in 6 months or sooner if you have any problems

## 2020-06-16 NOTE — Assessment & Plan Note (Addendum)
PFT consistent with mixed disease, suspect the restriction is the most significant impact on her dyspnea, due to deconditioning and obesity.  Consider also some residual interstitial change post Covid pneumonitis.  Keep albuterol available to use 2 puffs if needed

## 2020-06-20 ENCOUNTER — Other Ambulatory Visit: Payer: Self-pay | Admitting: Family Medicine

## 2020-06-23 DIAGNOSIS — R3915 Urgency of urination: Secondary | ICD-10-CM | POA: Diagnosis not present

## 2020-06-23 DIAGNOSIS — R8271 Bacteriuria: Secondary | ICD-10-CM | POA: Diagnosis not present

## 2020-06-23 DIAGNOSIS — N281 Cyst of kidney, acquired: Secondary | ICD-10-CM | POA: Diagnosis not present

## 2020-06-23 DIAGNOSIS — N3946 Mixed incontinence: Secondary | ICD-10-CM | POA: Diagnosis not present

## 2020-06-30 DIAGNOSIS — G4733 Obstructive sleep apnea (adult) (pediatric): Secondary | ICD-10-CM | POA: Diagnosis not present

## 2020-07-03 ENCOUNTER — Ambulatory Visit: Payer: BC Managed Care – PPO

## 2020-07-06 ENCOUNTER — Other Ambulatory Visit: Payer: Self-pay

## 2020-07-06 ENCOUNTER — Ambulatory Visit: Payer: BC Managed Care – PPO | Attending: Primary Care

## 2020-07-06 DIAGNOSIS — R293 Abnormal posture: Secondary | ICD-10-CM | POA: Diagnosis not present

## 2020-07-06 DIAGNOSIS — M6281 Muscle weakness (generalized): Secondary | ICD-10-CM | POA: Diagnosis not present

## 2020-07-06 DIAGNOSIS — R262 Difficulty in walking, not elsewhere classified: Secondary | ICD-10-CM | POA: Diagnosis not present

## 2020-07-06 DIAGNOSIS — M545 Low back pain, unspecified: Secondary | ICD-10-CM | POA: Diagnosis not present

## 2020-07-06 DIAGNOSIS — M5137 Other intervertebral disc degeneration, lumbosacral region: Secondary | ICD-10-CM | POA: Insufficient documentation

## 2020-07-06 DIAGNOSIS — M4310 Spondylolisthesis, site unspecified: Secondary | ICD-10-CM | POA: Insufficient documentation

## 2020-07-06 DIAGNOSIS — M62830 Muscle spasm of back: Secondary | ICD-10-CM | POA: Diagnosis not present

## 2020-07-06 DIAGNOSIS — R2689 Other abnormalities of gait and mobility: Secondary | ICD-10-CM | POA: Diagnosis not present

## 2020-07-07 ENCOUNTER — Ambulatory Visit: Payer: BC Managed Care – PPO

## 2020-07-07 DIAGNOSIS — M545 Low back pain, unspecified: Secondary | ICD-10-CM

## 2020-07-07 DIAGNOSIS — M62830 Muscle spasm of back: Secondary | ICD-10-CM | POA: Diagnosis not present

## 2020-07-07 DIAGNOSIS — M4310 Spondylolisthesis, site unspecified: Secondary | ICD-10-CM

## 2020-07-07 DIAGNOSIS — R293 Abnormal posture: Secondary | ICD-10-CM

## 2020-07-07 DIAGNOSIS — M6281 Muscle weakness (generalized): Secondary | ICD-10-CM | POA: Diagnosis not present

## 2020-07-07 DIAGNOSIS — M5137 Other intervertebral disc degeneration, lumbosacral region: Secondary | ICD-10-CM

## 2020-07-07 DIAGNOSIS — R262 Difficulty in walking, not elsewhere classified: Secondary | ICD-10-CM

## 2020-07-07 DIAGNOSIS — R2689 Other abnormalities of gait and mobility: Secondary | ICD-10-CM | POA: Diagnosis not present

## 2020-07-07 NOTE — Therapy (Signed)
[place], [place], [address] Phone: [phone]   Fax:  [phone]  Physical Therapy Treatment/Re-evaluation  Patient Details  Name: Beth [name] MRN: [id number] Date of Birth: 1976/05/27 Referring Provider (PT): [name], NP   Encounter Date: 07/06/2020   PT End of Session - 07/06/20 1837    Visit Number 14    Number of Visits 24    Date for PT Re-Evaluation 09/04/20    Authorization Type BCBS COMM PPO    Progress Note Due on Visit 10    PT Start Time 1832    PT Stop Time 1915    PT Time Calculation (min) 43 min    Equipment Utilized During Treatment Gait belt   gait belt during gait training   Activity Tolerance Patient tolerated treatment well;No increased pain    Behavior During Therapy WFL for tasks assessed/performed           Past Medical History:  Diagnosis Date  . Asthma   . Chronic pancreatitis ([place])   . DDD (degenerative disc disease), lumbosacral   . DJD (degenerative joint disease)   . Hyperlipidemia   . Hypertension   . Hypoglycemia   . Obesity   . Osteoarthritis   . Prolactinoma ([place])   . Sciatica     Past Surgical History:  Procedure Laterality Date  . CHOLECYSTECTOMY    . COLONOSCOPY      There were no vitals filed for this visit.   Subjective Assessment - 07/06/20 1929    Subjective Pt reports she can tell that she has been slacking with home exercises over the holidays as she has noticed an increase in pain over the past couple weeks.    Pertinent History Back issue 2012 received PT and was told the issue appeared to be disc related. Covid, 6/21; obesity    Limitations Sitting;Lifting;Standing;Walking;House hold activities    How long can you sit comfortably? 45 min to 1 hour    How long can you stand comfortably? 10 min    How long can you walk comfortably? 2000 steps in the course of a day    Diagnostic tests MRI scheduled for 10/2. X-rays 9/8:  X-rays lumbar spine reveal cholecystectomy surgical clips. She has a grade 1 anterolisthesis of L4 on L5. She has mild to moderate degenerative disc disease at L5-S1. 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    Patient Stated Goals To be active with less pain s medications    Currently in Pain? Yes    Pain Score --   0.7/10   Pain Location Back    Pain Orientation Lower    Pain Descriptors / Indicators Aching;Tightness    Pain Onset More than a month ago              [facility] PT Assessment - 07/06/20 0001      Assessment   Medical Diagnosis Acute low back pain; anterolisthesis; DDD    Referring Provider (PT) [name], NP      Observation/Other Assessments   Focus on Therapeutic Outcomes (FOTO)  37% functional status; predicted 48% function      AROM   Lumbar Flexion WFL - fingertips to level of malleoli    Lumbar Extension Minimal limitation with pt reporting "it feels like a stretch but not really pain"    Lumbar - Right Side Bend 1 inch above lateral knee joint line    Lumbar - Left Side Bend  1 inch above lateral knee joint line    Lumbar - Right Rotation WFL    Lumbar - Left Rotation [facility]      Strength   Strength Assessment Site Hip;Knee;Ankle    Right/Left Hip Right;Left    Right Hip Flexion 4+/5    Right Hip ABduction 4+/5    Right Hip ADduction 4+/5    Left Hip Flexion 4-/5    Left Hip ABduction 4+/5    Left Hip ADduction 4+/5    Right/Left Knee Right;Left    Right Knee Flexion 4+/5    Right Knee Extension 4+/5    Left Knee Flexion 4+/5    Left Knee Extension 4-/5    Right/Left Ankle Right;Left    Right Ankle Dorsiflexion 5/5    Right Ankle Plantar Flexion 5/5   modified test in sitting   Left Ankle Dorsiflexion 5/5    Left Ankle Plantar Flexion 5/5   modified test in sitting                        [facility] Adult PT Treatment/Exercise - 07/06/20 0001      Transfers   Five time sit to stand comments  31.96 seconds. O2 97% and HR 106 bpm       Ambulation/Gait   Ambulation/Gait Yes    Ambulation Distance (Feet) 420 Feet    Assistive device None    Gait Pattern Step-through pattern    Gait Comments Seated rest break after 350 feet then ambulated 70 feet.      Self-Care   Self-Care Other Self-Care Comments    Other Self-Care Comments  HEP, heel to toe gait pattern, core activation with activities, long axis distraction, objective findings, POC      Lumbar Exercises: Aerobic   Nustep L5 x 5 min with O2 96% and HR 92 bpm afterwards      Lumbar Exercises: Supine   Pelvic Tilt 15 reps    Dead Bug 15 reps    Dead Bug Limitations 15x each UE/LE      Manual Therapy   Manual Therapy Manual Traction    Manual Traction LLE long axis distraction with gentle oscillations                  PT Education - 07/06/20 1930    Education Details HEP, heel to toe gait pattern, core activation with activities, long axis distraction, objective findings, POC    Person(s) Educated Patient    Methods Explanation;Demonstration;Tactile cues;Verbal cues    Comprehension Verbalized understanding;Returned demonstration;Verbal cues required;Tactile cues required            PT Short Term Goals - 05/29/20 0829      PT SHORT TERM GOAL #1   Title Pt wll be ind in a final HEP. Achieved      PT SHORT TERM GOAL #2   Title Pt will voice understanding of measures to assist in pain reduction and management. Achieved             PT Long Term Goals - 07/06/20 1844      PT LONG TERM GOAL #1   Title Improved trunk ROM to at least 75% of normal for all movements. Achieved      PT LONG TERM GOAL #2   Title Pt's will report improved ability to complete usual work and daily activities to a little difficulty. On-going- Moderate difficulty    Status On-going      PT LONG  TERM GOAL #3   Title Pt will report improved ability to stand from a chair to no difficulty. On-going- still some difficulty but less than before    Status Partially Met       PT LONG TERM GOAL #4   Title pt's FOTO survey score will improved to the predicted value of 52% limitation. On-going    Status On-going      PT LONG TERM GOAL #5   Title Pt will be ind in a final HEP. On-going    Status On-going                 Plan - 07/06/20 1848    Clinical Impression Statement Patient tolerated treatment session well but continues to experience DOE with interventions. She does well reminding herself of core activation and pursed lip breathing when indicated. MMT assessment of BLE reveals weakness of L hip FL and knee EXT with minimal low back pain during resistance that did not linger.  Patient's FOTO score improved to 37% functional status with predicted 48% function. She should benefit from continued skilled PT intervention to allow for increased general strength, progression towards ambulation without an assistive device, and for pt to achieve long term functional goals such as being able to dance again without significant DOE.    Personal Factors and Comorbidities Comorbidity 2;Fitness    Comorbidities Deconditioning related to covid; obesity    Examination-Activity Limitations Bed Mobility;Continence;Lift;Stand;Stairs;Squat;Sleep;Sit;Locomotion Level    Stability/Clinical Decision Making Evolving/Moderate complexity    Rehab Potential Good    PT Frequency 1x / week    PT Duration 8 weeks    PT Treatment/Interventions ADLs/Self Care Home Management;Cryotherapy;Electrical Stimulation;Ultrasound;Traction;Moist Heat;Iontophoresis 65m/ml Dexamethasone;Gait training;Stair training;Functional mobility training;Therapeutic activities;Therapeutic exercise;Balance training;Neuromuscular re-education;Manual techniques;Patient/family education;Dry needling;Energy conservation;Taping;Vasopneumatic Device    PT Next Visit Plan Lumbar/core strengthening. Progress gait away from rollator as tolerated. Side stepping and balance activities with core activation to progress  towards return to dancing    PT Home Exercise Plan [id number]    Consulted and Agree with Plan of Care Patient           Patient will benefit from skilled therapeutic intervention in order to improve the following deficits and impairments:  Abnormal gait,Decreased range of motion,Difficulty walking,Decreased endurance,Increased muscle spasms,Obesity,Decreased activity tolerance,Pain,Decreased balance,Decreased mobility,Decreased strength,Postural dysfunction  Visit Diagnosis: Muscle spasm of back  Acute midline low back pain without sciatica  Other abnormalities of gait and mobility  Abnormal posture  Difficulty in walking, not elsewhere classified  DDD (degenerative disc disease), lumbosacral  Muscle weakness (generalized)  Anterolisthesis     Problem List Patient Active Problem List   Diagnosis Date Noted  . DJD (degenerative joint disease)   . Sciatica   . Osteoarthritis   . Obstructive sleep apnea 03/11/2020  . Snoring 02/05/2020  . Pneumonia due to COVID-19 virus 12/15/2019  . Acute respiratory failure with hypoxia ([place] 12/15/2019  . Hypokalemia 12/15/2019  . Asthma without acute exacerbation 12/15/2019  . Chronic pancreatitis ([place]   . Hyperprolactinemia ([place] 02/15/2018     KHaydee Monica PT, DPT 07/07/20 2:47 AM  [facility] [address]Phone: [phone]  Fax:  [phone] Name: DZoeann MolMRN: [id number] of Birth: [birth year]

## 2020-07-08 NOTE — Therapy (Signed)
[place], [place], [address] Phone: [phone]   Fax:  [phone]  Physical Therapy Treatment  Patient Details  Name: Beth [name] MRN: [id number] Date of Birth: 27-Apr-1976 Referring Provider (PT): [name], NP   Encounter Date: 07/07/2020   PT End of Session - 07/07/20 1836    Visit Number 15    Number of Visits 24    Date for PT Re-Evaluation 09/04/20    Authorization Type BCBS COMM PPO    Progress Note Due on Visit 10    PT Start Time 1779    PT Stop Time 1915    PT Time Calculation (min) 45 min    Equipment Utilized During Treatment Gait belt   gait belt during gait training   Activity Tolerance Patient tolerated treatment well;No increased pain    Behavior During Therapy WFL for tasks assessed/performed           Past Medical History:  Diagnosis Date  . Asthma   . Chronic pancreatitis ([place])   . DDD (degenerative disc disease), lumbosacral   . DJD (degenerative joint disease)   . Hyperlipidemia   . Hypertension   . Hypoglycemia   . Obesity   . Osteoarthritis   . Prolactinoma ([place])   . Sciatica     Past Surgical History:  Procedure Laterality Date  . CHOLECYSTECTOMY    . COLONOSCOPY      There were no vitals filed for this visit.   Subjective Assessment - 07/08/20 0253    Subjective Pt reports having some soreness in the evening following yesterday's session but no increased pain.    Pertinent History Back issue 2012 received PT and was told the issue appeared to be disc related. Covid, 6/21; obesity    Limitations Sitting;Lifting;Standing;Walking;House hold activities    How long can you sit comfortably? 45 min to 1 hour    How long can you stand comfortably? 10 min    How long can you walk comfortably? 2000 steps in the course of a day    Diagnostic tests MRI scheduled for 10/2. X-rays 9/8: X-rays lumbar spine reveal cholecystectomy surgical clips. She has a  grade 1 anterolisthesis of L4 on L5. She has mild to moderate degenerative disc disease at L5-S1. 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    Patient Stated Goals To be active with less pain s medications    Currently in Pain? Yes    Pain Score --   "less than 1/10 when I was walking" referring to walking from lobby to therapy gym   Pain Location Back    Pain Orientation Lower    Pain Descriptors / Indicators Aching;Tightness    Pain Type Chronic pain    Pain Onset More than a month ago              [facility] PT Assessment - 07/07/20 0001      Assessment   Medical Diagnosis Acute low back pain; anterolisthesis; DDD    Referring Provider (PT) [name], NP                         [facility] Adult PT Treatment/Exercise - 07/07/20 0001      Ambulation/Gait   Ambulation/Gait Yes    Ambulation Distance (Feet) 540 Feet    Assistive device None    Gait Pattern Step-through pattern    Gait Comments Brief standing rest break after 440  feet due to spasm in L thoracolumbar paraspinals. Gait belt applied for safety.      Self-Care   Self-Care Other Self-Care Comments    Other Self-Care Comments  Core engagement with activities, body mechanics with bending, reviewed heel to toe gait pattern      Lumbar Exercises: Aerobic   Nustep L5 x 5 min with LE only      Lumbar Exercises: Seated   Other Seated Lumbar Exercises Hip hinge x 20 with cues for body mechanics, neutral spine, and core engagement. PT holding dowel for tactile cue due to pt having shoulder pain.      Lumbar Exercises: Supine   Dead Bug 20 reps    Dead Bug Limitations 10x LE alternating marches then 10x UE/LE - all with cues for core engagement      Lumbar Exercises: Quadruped   Madcat/Old Horse 20 reps    Madcat/Old Horse Limitations cat then to neutral in order to avoid extension past neutral      Knee/Hip Exercises: Stretches   Hip Flexor Stretch Limitations L Thomas stretch 3 x 30 sec      Manual  Therapy   Manual Therapy Manual Traction    Manual Traction LLE long axis distraction with gentle oscillations                  PT Education - 07/08/20 0256    Education Details Core engagement with activities, body mechanics with bending, reviewed heel to toe gait pattern    Person(s) Educated Patient    Methods Explanation;Demonstration;Tactile cues;Verbal cues    Comprehension Verbalized understanding;Returned demonstration;Verbal cues required;Tactile cues required            PT Short Term Goals - 05/29/20 0829      PT SHORT TERM GOAL #1   Title Pt wll be ind in a final HEP. Achieved      PT SHORT TERM GOAL #2   Title Pt will voice understanding of measures to assist in pain reduction and management. Achieved             PT Long Term Goals - 07/06/20 1844      PT LONG TERM GOAL #1   Title Improved trunk ROM to at least 75% of normal for all movements. Achieved      PT LONG TERM GOAL #2   Title Pt's will report improved ability to complete usual work and daily activities to a little difficulty. On-going- Moderate difficulty    Status On-going      PT LONG TERM GOAL #3   Title Pt will report improved ability to stand from a chair to no difficulty. On-going- still some difficulty but less than before    Status Partially Met      PT LONG TERM GOAL #4   Title pt's FOTO survey score will improved to the predicted value of 52% limitation. On-going    Status On-going      PT LONG TERM GOAL #5   Title Pt will be ind in a final HEP. On-going    Status On-going                 Plan - 07/07/20 1836    Clinical Impression Statement Patient tolerated treatment session well with O2 remaining 95-98% throughout session and HR 88-96 bpm during supine and seated exercises. Pt has increased HR and DOE during aerobic activity and ambulation with quick recovery during static standing or seated rest breaks. She experienced minimal spasm  in L lower thoracic and lumbar  paraspinals during gait training that eased with myofascial release as she took a brief standing rest break. Pt was able to ambulate an increased distance without use of rollator (gait belt applied for safety) today and only required standing break compared to seated break for recovery during yesterday's session.    Personal Factors and Comorbidities Comorbidity 2;Fitness    Comorbidities Deconditioning related to covid; obesity    Examination-Activity Limitations Bed Mobility;Continence;Lift;Stand;Stairs;Squat;Sleep;Sit;Locomotion Level    Stability/Clinical Decision Making Evolving/Moderate complexity    Rehab Potential Good    PT Frequency 1x / week    PT Duration 8 weeks    PT Treatment/Interventions ADLs/Self Care Home Management;Cryotherapy;Electrical Stimulation;Ultrasound;Traction;Moist Heat;Iontophoresis 74m/ml Dexamethasone;Gait training;Stair training;Functional mobility training;Therapeutic activities;Therapeutic exercise;Balance training;Neuromuscular re-education;Manual techniques;Patient/family education;Dry needling;Energy conservation;Taping;Vasopneumatic Device    PT Next Visit Plan Lumbar/core strengthening. Progress gait away from rollator as tolerated. Side stepping and balance activities with core activation to progress towards return to dancing (discussed eventual cha cha slide/electric slide to incorporate dynamic balance and dance steps)    PT Home Exercise Plan [id number]    Consulted and Agree with Plan of Care Patient           Patient will benefit from skilled therapeutic intervention in order to improve the following deficits and impairments:  Abnormal gait,Decreased range of motion,Difficulty walking,Decreased endurance,Increased muscle spasms,Obesity,Decreased activity tolerance,Pain,Decreased balance,Decreased mobility,Decreased strength,Postural dysfunction  Visit Diagnosis: Muscle spasm of back  Acute midline low back pain without sciatica  Other abnormalities  of gait and mobility  Abnormal posture  Difficulty in walking, not elsewhere classified  DDD (degenerative disc disease), lumbosacral  Muscle weakness (generalized)  Anterolisthesis     Problem List Patient Active Problem List   Diagnosis Date Noted  . DJD (degenerative joint disease)   . Sciatica   . Osteoarthritis   . Obstructive sleep apnea 03/11/2020  . Snoring 02/05/2020  . Pneumonia due to COVID-19 virus 12/15/2019  . Acute respiratory failure with hypoxia ([place] 12/15/2019  . Hypokalemia 12/15/2019  . Asthma without acute exacerbation 12/15/2019  . Chronic pancreatitis ([place]   . Hyperprolactinemia ([place] 02/15/2018     KHaydee Monica PT, DPT 07/08/20 3:12 AM  [facility]GPoll[address] N[place] [address]Phone: [phone]  Fax:  [phone] Name: DAubriana RaveloMRN: [id number] of Birth: 102-28-1977

## 2020-07-13 ENCOUNTER — Encounter (INDEPENDENT_AMBULATORY_CARE_PROVIDER_SITE_OTHER): Payer: Self-pay | Admitting: Primary Care

## 2020-07-14 ENCOUNTER — Ambulatory Visit: Payer: BC Managed Care – PPO | Admitting: Rehabilitative and Restorative Service Providers"

## 2020-07-15 ENCOUNTER — Other Ambulatory Visit: Payer: Self-pay

## 2020-07-15 ENCOUNTER — Ambulatory Visit: Payer: BC Managed Care – PPO

## 2020-07-15 DIAGNOSIS — M62830 Muscle spasm of back: Secondary | ICD-10-CM

## 2020-07-15 DIAGNOSIS — R2689 Other abnormalities of gait and mobility: Secondary | ICD-10-CM | POA: Diagnosis not present

## 2020-07-15 DIAGNOSIS — M545 Low back pain, unspecified: Secondary | ICD-10-CM

## 2020-07-15 DIAGNOSIS — M6281 Muscle weakness (generalized): Secondary | ICD-10-CM

## 2020-07-15 DIAGNOSIS — R293 Abnormal posture: Secondary | ICD-10-CM

## 2020-07-15 DIAGNOSIS — R262 Difficulty in walking, not elsewhere classified: Secondary | ICD-10-CM

## 2020-07-15 DIAGNOSIS — M4310 Spondylolisthesis, site unspecified: Secondary | ICD-10-CM | POA: Diagnosis not present

## 2020-07-15 DIAGNOSIS — M5137 Other intervertebral disc degeneration, lumbosacral region: Secondary | ICD-10-CM | POA: Diagnosis not present

## 2020-07-15 NOTE — Therapy (Signed)
[place], [place], [address] Phone: [phone]   Fax:  [phone]  Physical Therapy Treatment  Patient Details  Name: Beth [name] MRN: [id number] Date of Birth: 1976/04/15 Referring Provider (PT): [name], NP   Encounter Date: 07/15/2020   PT End of Session - 07/15/20 1937    Visit Number 16    Number of Visits 24    Date for PT Re-Evaluation 09/04/20    Authorization Type BCBS COMM PPO    Progress Note Due on Visit 10    PT Start Time 1835    PT Stop Time 1921    PT Time Calculation (min) 46 min    Equipment Utilized During Treatment Gait belt   gait belt during gait training   Activity Tolerance Patient tolerated treatment well;No increased pain    Behavior During Therapy WFL for tasks assessed/performed           Past Medical History:  Diagnosis Date  . Asthma   . Chronic pancreatitis ([place])   . DDD (degenerative disc disease), lumbosacral   . DJD (degenerative joint disease)   . Hyperlipidemia   . Hypertension   . Hypoglycemia   . Obesity   . Osteoarthritis   . Prolactinoma ([place])   . Sciatica     Past Surgical History:  Procedure Laterality Date  . CHOLECYSTECTOMY    . COLONOSCOPY      There were no vitals filed for this visit.   Subjective Assessment - 07/15/20 1841    Subjective Pt reports having some increased soreness due to being out and about throughout the day at doctor's appointments and the store. She explains that she used her SPC during errands that required less walking and used the rollator if she knew she would be walking more.    Pertinent History Back issue 2012 received PT and was told the issue appeared to be disc related. Covid, 6/21; obesity    Limitations Sitting;Lifting;Standing;Walking;House hold activities    How long can you sit comfortably? 45 min to 1 hour    How long can you stand comfortably? 10 min    How long can you walk  comfortably? 2000 steps in the course of a day    Diagnostic tests MRI scheduled for 10/2. X-rays 9/8: X-rays lumbar spine reveal cholecystectomy surgical clips. She has a grade 1 anterolisthesis of L4 on L5. She has mild to moderate degenerative disc disease at L5-S1. 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    Patient Stated Goals To be active with less pain s medications    Currently in Pain? Yes    Pain Score 3     Pain Location Back    Pain Orientation Lower    Pain Descriptors / Indicators Aching    Pain Onset More than a month ago              [facility] PT Assessment - 07/15/20 0001      Assessment   Medical Diagnosis Acute low back pain; anterolisthesis; DDD    Referring Provider (PT) [name], NP                         [facility] Adult PT Treatment/Exercise - 07/15/20 0001      Ambulation/Gait   Ambulation/Gait Yes    Ambulation Distance (Feet) 360 Feet    Assistive device Straight cane    Gait Pattern Step-through pattern  Gait Comments Gait training to demonstrate and have pt return demonstration of proper sequencing and use of SPC with 2-point gait pattern due to pt using cane instead of rollator at times now.      Self-Care   Self-Care Other Self-Care Comments    Other Self-Care Comments  Education regarding use of SPC during ambulation. Reviewed and updated HEP. Pt mentioned numbness in hands that is worse upon waking up in the mornings that initially occurred primarily along ulnar distribution but now is also in other digits. She described having numbness/tingling in ulnary distribution while having elbows propped on table as well - advised to inform doctor of symptoms and request potential PT referral if appropriate. Education provided regarding nerve roots and ulnar nerve distribution and pathway along ulnar groove - discussed how tension/compression in these areas may be contributing but advised to see doctor for further assessment.      Lumbar  Exercises: Stretches   Standing Side Bend Limitations doorway quadratus lumborum stretch x 1 min    Other Lumbar Stretch Exercise seated forward/lateral flexion QL stretch with green swiss ball to the R to stretch L QL 5 x 10 sec      Lumbar Exercises: Aerobic   Nustep L5 x 5 min LE only      Lumbar Exercises: Quadruped   Madcat/Old Horse 20 reps    Madcat/Old Horse Limitations cat then to neutral in order to avoid extension past neutral    Other Quadruped Lumbar Exercises fire hydrant LLE x 5    Other Quadruped Lumbar Exercises child's pose x 45 sec      Manual Therapy   Manual Therapy Soft tissue mobilization;Myofascial release;Manual Traction    Soft tissue mobilization STM, IASTM, and myofascial release along L lower thoracic and lumbar paraspinals and L QL    Manual Traction LLE long axis distraction with gentle oscillations                  PT Education - 07/15/20 1936    Education Details Education regarding use of SPC during ambulation. Reviewed and updated HEP. Pt mentioned numbness in hands that is worse upon waking up in the mornings that initially occurred primarily along ulnar distribution but now is also in other digits. She described having numbness/tingling in ulnary distribution while having elbows propped on table as well - advised to inform doctor of symptoms and request potential PT referral if appropriate. Education provided regarding nerve roots and ulnar nerve distribution and pathway along ulnar groove - discussed how tension/compression in these areas may be contributing but advised to see doctor for further assessment.    Person(s) Educated Patient    Methods Explanation;Demonstration;Tactile cues;Verbal cues;Handout    Comprehension Verbalized understanding;Returned demonstration            PT Short Term Goals - 05/29/20 0829      PT SHORT TERM GOAL #1   Title Pt wll be ind in a final HEP. Achieved      PT SHORT TERM GOAL #2   Title Pt will voice  understanding of measures to assist in pain reduction and management. Achieved             PT Long Term Goals - 07/06/20 1844      PT LONG TERM GOAL #1   Title Improved trunk ROM to at least 75% of normal for all movements. Achieved      PT LONG TERM GOAL #2   Title Pt's will report improved ability  to complete usual work and daily activities to a little difficulty. On-going- Moderate difficulty    Status On-going      PT LONG TERM GOAL #3   Title Pt will report improved ability to stand from a chair to no difficulty. On-going- still some difficulty but less than before    Status Partially Met      PT LONG TERM GOAL #4   Title pt's FOTO survey score will improved to the predicted value of 52% limitation. On-going    Status On-going      PT LONG TERM GOAL #5   Title Pt will be ind in a final HEP. On-going    Status On-going                 Plan - 07/15/20 1939    Clinical Impression Statement Patient tolerated treatment session well with no adverse effects or increased pain. Focused on relieving "twinge" in left lower back today with stretches and manual techniques. Pt was able to perform gait training using [place] with cues for proper 2-point gait sequencing. She expressed relief and reported that the "twinge" in her low back "feels a lot better" at end of session.    Personal Factors and Comorbidities Comorbidity 2;Fitness    Comorbidities Deconditioning related to covid; obesity    Examination-Activity Limitations Bed Mobility;Continence;Lift;Stand;Stairs;Squat;Sleep;Sit;Locomotion Level    Stability/Clinical Decision Making Evolving/Moderate complexity    Rehab Potential Good    PT Frequency 1x / week    PT Duration 8 weeks    PT Treatment/Interventions ADLs/Self Care Home Management;Cryotherapy;Electrical Stimulation;Ultrasound;Traction;Moist Heat;Iontophoresis 56m/ml Dexamethasone;Gait training;Stair training;Functional mobility training;Therapeutic  activities;Therapeutic exercise;Balance training;Neuromuscular re-education;Manual techniques;Patient/family education;Dry needling;Energy conservation;Taping;Vasopneumatic Device    PT Next Visit Plan Lumbar/core strengthening. Continue gait training with SPC and no AD as tolerated. Side stepping and balance activities with core activation to progress towards return to dancing (discussed eventual cha cha slide/electric slide to incorporate dynamic balance and dance steps)    PT Home Exercise Plan [id number]    Consulted and Agree with Plan of Care Patient           Patient will benefit from skilled therapeutic intervention in order to improve the following deficits and impairments:  Abnormal gait,Decreased range of motion,Difficulty walking,Decreased endurance,Increased muscle spasms,Obesity,Decreased activity tolerance,Pain,Decreased balance,Decreased mobility,Decreased strength,Postural dysfunction  Visit Diagnosis: Muscle spasm of back  Acute midline low back pain without sciatica  Other abnormalities of gait and mobility  Abnormal posture  Difficulty in walking, not elsewhere classified  DDD (degenerative disc disease), lumbosacral  Muscle weakness (generalized)  Anterolisthesis     Problem List Patient Active Problem List   Diagnosis Date Noted  . DJD (degenerative joint disease)   . Sciatica   . Osteoarthritis   . Obstructive sleep apnea 03/11/2020  . Snoring 02/05/2020  . Pneumonia due to COVID-19 virus 12/15/2019  . Acute respiratory failure with hypoxia ([place] 12/15/2019  . Hypokalemia 12/15/2019  . Asthma without acute exacerbation 12/15/2019  . Chronic pancreatitis ([place]   . Hyperprolactinemia ([place] 02/15/2018      KHaydee Monica PT, DPT 07/15/20 7:45 PM  [facility] [address]GBron[address] [place] [address]Phone: [phone]  Fax:  [phone] Name: DLangston SummerfieldMRN: [id number] of Birth:  [birth year]

## 2020-07-16 ENCOUNTER — Telehealth (INDEPENDENT_AMBULATORY_CARE_PROVIDER_SITE_OTHER): Payer: Self-pay | Admitting: Primary Care

## 2020-07-16 NOTE — Telephone Encounter (Signed)
Beth [name] with [facility] calling to let you know pt has enrolled in their case management program . Her case managers name is Beth [name] Her direct phone number is: [phone]

## 2020-07-24 ENCOUNTER — Ambulatory Visit: Payer: BC Managed Care – PPO | Admitting: Rehabilitative and Restorative Service Providers"

## 2020-07-29 ENCOUNTER — Ambulatory Visit: Payer: BC Managed Care – PPO | Attending: Primary Care

## 2020-07-29 ENCOUNTER — Other Ambulatory Visit: Payer: Self-pay

## 2020-07-29 DIAGNOSIS — R293 Abnormal posture: Secondary | ICD-10-CM | POA: Diagnosis not present

## 2020-07-29 DIAGNOSIS — M6281 Muscle weakness (generalized): Secondary | ICD-10-CM | POA: Diagnosis not present

## 2020-07-29 DIAGNOSIS — M5137 Other intervertebral disc degeneration, lumbosacral region: Secondary | ICD-10-CM | POA: Diagnosis not present

## 2020-07-29 DIAGNOSIS — M545 Low back pain, unspecified: Secondary | ICD-10-CM

## 2020-07-29 DIAGNOSIS — R2689 Other abnormalities of gait and mobility: Secondary | ICD-10-CM | POA: Diagnosis not present

## 2020-07-29 DIAGNOSIS — R262 Difficulty in walking, not elsewhere classified: Secondary | ICD-10-CM | POA: Diagnosis not present

## 2020-07-29 DIAGNOSIS — M4310 Spondylolisthesis, site unspecified: Secondary | ICD-10-CM

## 2020-07-29 DIAGNOSIS — M62830 Muscle spasm of back: Secondary | ICD-10-CM

## 2020-07-30 DIAGNOSIS — Z3202 Encounter for pregnancy test, result negative: Secondary | ICD-10-CM | POA: Diagnosis not present

## 2020-07-30 DIAGNOSIS — R82998 Other abnormal findings in urine: Secondary | ICD-10-CM | POA: Diagnosis not present

## 2020-07-30 DIAGNOSIS — M5430 Sciatica, unspecified side: Secondary | ICD-10-CM | POA: Insufficient documentation

## 2020-07-30 DIAGNOSIS — Z30433 Encounter for removal and reinsertion of intrauterine contraceptive device: Secondary | ICD-10-CM | POA: Diagnosis not present

## 2020-07-30 NOTE — Therapy (Signed)
[place], [place], [address] Phone: [phone]   Fax:  [phone]  Physical Therapy Treatment  Patient Details  Name: Beth [name] MRN: [id number] Date of Birth: 04-Sep-1975 Referring Provider (PT): [name], NP   Encounter Date: 07/29/2020   PT End of Session - 07/30/20 1523    Visit Number 17    Number of Visits 24    Date for PT Re-Evaluation 09/04/20    Authorization Type BCBS COMM PPO    Progress Note Due on Visit 10    PT Start Time 0277    PT Stop Time 4128    PT Time Calculation (min) 42 min    Activity Tolerance Patient tolerated treatment well;No increased pain    Behavior During Therapy WFL for tasks assessed/performed           Past Medical History:  Diagnosis Date  . Asthma   . Chronic pancreatitis ([place])   . DDD (degenerative disc disease), lumbosacral   . DJD (degenerative joint disease)   . Hyperlipidemia   . Hypertension   . Hypoglycemia   . Obesity   . Osteoarthritis   . Prolactinoma ([place])   . Sciatica     Past Surgical History:  Procedure Laterality Date  . CHOLECYSTECTOMY    . COLONOSCOPY      There were no vitals filed for this visit.   Subjective Assessment - 07/29/20 1758    Subjective Pt reports she is having a good day, primarily no pain. Only having infrequent pain with her low back with certain movements.    Diagnostic tests MRI scheduled for 10/2. X-rays 9/8: X-rays lumbar spine reveal cholecystectomy surgical clips. She has a grade 1 anterolisthesis of L4 on L5. She has mild to moderate degenerative disc disease at L5-S1. 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    Patient Stated Goals To be active with less pain s medications    Currently in Pain? Yes    Pain Score 2     Pain Location Back    Pain Orientation Lower    Pain Descriptors / Indicators Other (Comment)   pinch   Pain Type Chronic pain    Pain Onset More than a month ago     Pain Frequency Intermittent    Aggravating Factors  certain back movements    Effect of Pain on Daily Activities Significant impact on activity level                             [facility] Adult PT Treatment/Exercise - 07/30/20 0001      Ambulation/Gait   Ambulation Distance (Feet) 370 Feet   Pre walk: O2 sat 98%, HR 88. Post walk: O2 sat 94%, HR 98   Assistive device None    Gait Pattern Step-through pattern      Exercises   Exercises Lumbar;Knee/Hip;Neck      Lumbar Exercises: Aerobic   Nustep L5 x 5 min LE only      Knee/Hip Exercises: Standing   Hip Flexion Right;Left;10 reps;Knee straight    Hip Abduction Right;Left;10 reps;Knee straight    Hip Extension Right;Left;Knee straight      Neck Exercises: Stretches   Upper Trapezius Stretch Right;3 reps;20 seconds    Levator Stretch Right;3 reps;20 seconds                  PT Education - 07/29/20 1819  Education Details Upper trap and levator scap stretches and use of theracane to address upper trap pain    Person(s) Educated Patient    Methods Explanation;Demonstration;Tactile cues;Verbal cues    Comprehension Verbalized understanding;Returned demonstration;Verbal cues required;Tactile cues required            PT Short Term Goals - 05/29/20 0829      PT SHORT TERM GOAL #1   Title Pt wll be ind in a final HEP. Achieved      PT SHORT TERM GOAL #2   Title Pt will voice understanding of measures to assist in pain reduction and management. Achieved             PT Long Term Goals - 07/06/20 1844      PT LONG TERM GOAL #1   Title Improved trunk ROM to at least 75% of normal for all movements. Achieved      PT LONG TERM GOAL #2   Title Pt's will report improved ability to complete usual work and daily activities to a little difficulty. On-going- Moderate difficulty    Status On-going      PT LONG TERM GOAL #3   Title Pt will report improved ability to stand from a chair to no difficulty.  On-going- still some difficulty but less than before    Status Partially Met      PT LONG TERM GOAL #4   Title pt's FOTO survey score will improved to the predicted value of 52% limitation. On-going    Status On-going      PT LONG TERM GOAL #5   Title Pt will be ind in a final HEP. On-going    Status On-going                 Plan - 07/30/20 1524    Clinical Impression Statement PT was completed to address pt's upper trap pain, LE strength and balance, and walking tolerance s the rollator. Pt was provided stretches and recommendation for a theracane to help her addresss the upper trap pain. Standing LE exs were completed for strength and balance. Walking was completed without the rollator. Pt walked with an appropriate gait pattern and balance. Pt's walking tolerance, when walking without the rollator, was the development of low back discomfort    Personal Factors and Comorbidities Comorbidity 2;Fitness    Comorbidities Deconditioning related to covid; obesity    Examination-Activity Limitations Bed Mobility;Continence;Lift;Stand;Stairs;Squat;Sleep;Sit;Locomotion Level    Stability/Clinical Decision Making Evolving/Moderate complexity    Clinical Decision Making Moderate    Rehab Potential Good    PT Frequency 1x / week    PT Duration 8 weeks    PT Treatment/Interventions ADLs/Self Care Home Management;Cryotherapy;Electrical Stimulation;Ultrasound;Traction;Moist Heat;Iontophoresis 24m/ml Dexamethasone;Gait training;Stair training;Functional mobility training;Therapeutic activities;Therapeutic exercise;Balance training;Neuromuscular re-education;Manual techniques;Patient/family education;Dry needling;Energy conservation;Taping;Vasopneumatic Device    PT Next Visit Plan Lumbar/core strengthening. Continue gait training with SPC and no AD as tolerated. Side stepping and balance activities with core activation to progress towards return to dancing (discussed eventual cha cha slide/electric  slide to incorporate dynamic balance and dance steps)    PT Home Exercise Plan [id number]    Consulted and Agree with Plan of Care Patient           Patient will benefit from skilled therapeutic intervention in order to improve the following deficits and impairments:  Abnormal gait,Decreased range of motion,Difficulty walking,Decreased endurance,Increased muscle spasms,Obesity,Decreased activity tolerance,Pain,Decreased balance,Decreased mobility,Decreased strength,Postural dysfunction  Visit Diagnosis: Muscle spasm of back  Acute midline low back pain  without sciatica  Other abnormalities of gait and mobility  Abnormal posture  Difficulty in walking, not elsewhere classified  DDD (degenerative disc disease), lumbosacral  Muscle weakness (generalized)  Anterolisthesis     Problem List Patient Active Problem List   Diagnosis Date Noted  . DJD (degenerative joint disease)   . Sciatica   . Osteoarthritis   . Obstructive sleep apnea 03/11/2020  . Snoring 02/05/2020  . Pneumonia due to COVID-19 virus 12/15/2019  . Acute respiratory failure with hypoxia ([facility]) 12/15/2019  . Hypokalemia 12/15/2019  . Asthma without acute exacerbation 12/15/2019  . Chronic pancreatitis ([facility])   . Hyperprolactinemia ([facility]) 02/15/2018    Allen Ralls MS, PT 07/30/20 7:21 PM  [facility] [address] [place], [place], [address] Phone: [phone]   Fax:  [phone]  Name: Shaterrica Rachel MRN: [id number] Date of Birth: 01/28/1976   

## 2020-07-30 NOTE — Patient Instructions (Signed)
Use of theracane for R upper trap muscle tightness. Upper trap stretch 20 sec. Levator sacp stretch 20 sec

## 2020-07-31 DIAGNOSIS — G4733 Obstructive sleep apnea (adult) (pediatric): Secondary | ICD-10-CM | POA: Diagnosis not present

## 2020-08-03 ENCOUNTER — Other Ambulatory Visit: Payer: Self-pay

## 2020-08-03 ENCOUNTER — Ambulatory Visit: Payer: BC Managed Care – PPO

## 2020-08-03 DIAGNOSIS — M545 Low back pain, unspecified: Secondary | ICD-10-CM

## 2020-08-03 DIAGNOSIS — M5137 Other intervertebral disc degeneration, lumbosacral region: Secondary | ICD-10-CM

## 2020-08-03 DIAGNOSIS — M6281 Muscle weakness (generalized): Secondary | ICD-10-CM | POA: Diagnosis not present

## 2020-08-03 DIAGNOSIS — M62830 Muscle spasm of back: Secondary | ICD-10-CM | POA: Diagnosis not present

## 2020-08-03 DIAGNOSIS — R262 Difficulty in walking, not elsewhere classified: Secondary | ICD-10-CM

## 2020-08-03 DIAGNOSIS — M4310 Spondylolisthesis, site unspecified: Secondary | ICD-10-CM

## 2020-08-03 DIAGNOSIS — R2689 Other abnormalities of gait and mobility: Secondary | ICD-10-CM | POA: Diagnosis not present

## 2020-08-03 DIAGNOSIS — R293 Abnormal posture: Secondary | ICD-10-CM | POA: Diagnosis not present

## 2020-08-03 NOTE — Therapy (Signed)
[place], [place], [address] Phone: [phone]   Fax:  [phone]  Physical Therapy Treatment  Patient Details  Name: Beth [name] MRN: [id number] Date of Birth: 14-Feb-1976 Referring Provider (PT): [name], NP   Encounter Date: 08/03/2020   PT End of Session - 08/03/20 1803    Visit Number 18    Number of Visits 24    Date for PT Re-Evaluation 09/04/20    Authorization Type BCBS COMM PPO    PT Start Time 2409    PT Stop Time 1835    PT Time Calculation (min) 45 min    Activity Tolerance Patient tolerated treatment well;No increased pain    Behavior During Therapy WFL for tasks assessed/performed           Past Medical History:  Diagnosis Date  . Asthma   . Chronic pancreatitis ([place])   . DDD (degenerative disc disease), lumbosacral   . DJD (degenerative joint disease)   . Hyperlipidemia   . Hypertension   . Hypoglycemia   . Obesity   . Osteoarthritis   . Prolactinoma ([place])   . Sciatica     Past Surgical History:  Procedure Laterality Date  . CHOLECYSTECTOMY    . COLONOSCOPY      There were no vitals filed for this visit.   Subjective Assessment - 08/03/20 1756    Subjective Pt reports trying to stand for an hour at work today, which require wt shifting to tolerate. Pt was pleased with how she did. Pt reports recommendations for positioning while sleeping and stretches have been helpful for her R shoulder pain.    Pertinent History Back issue 2012 received PT and was told the issue appeared to be disc related. Covid, 6/21; obesity    Diagnostic tests MRI scheduled for 10/2. X-rays 9/8: X-rays lumbar spine reveal cholecystectomy surgical clips. She has a grade 1 anterolisthesis of L4 on L5. She has mild to moderate degenerative disc disease at L5-S1. 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    Patient Stated Goals To be active with less pain s medications    Currently in  Pain? Yes    Pain Score 1     Pain Location Back    Pain Orientation Left;Lateral    Pain Descriptors / Indicators Tightness;Other (Comment)   twinge   Pain Type Chronic pain    Pain Onset More than a month ago    Pain Frequency Intermittent                             [facility] Adult PT Treatment/Exercise - 08/03/20 0001      Ambulation/Gait   Ambulation Distance (Feet) 555 Feet   Pre:O2 sat 98%, HR 84; Post: O2 sat 98%, HR 108. RPE Somewhat hard   Assistive device None    Gait Pattern Step-through pattern   appropriate upright postue     Exercises   Exercises Lumbar;Knee/Hip;Neck      Lumbar Exercises: Stretches   Active Hamstring Stretch Right;Left;2 reps;20 seconds    Active Hamstring Stretch Limitations seated      Lumbar Exercises: Aerobic   Nustep L5 x 5 min, UE/LE      Lumbar Exercises: Seated   Other Seated Lumbar Exercises Shoulder ER with scap retractions 10x3 c red Tband      Lumbar Exercises: Supine   Pelvic Tilt 10 reps    Pelvic  Tilt Limitations 3 sec      Knee/Hip Exercises: Standing   Hip Abduction Right;Left;10 reps;Knee straight;2 sets    Hip Extension Right;Left;Knee straight;2 sets    Extension Limitations planargrade      Manual Therapy   Manual Therapy Joint mobilization    Joint Mobilization Grade 3 LAD to the L and R hips                  PT Education - 08/03/20 1818    Education Details Bilat shoulder ER c red Tband    Person(s) Educated Patient    Methods Explanation;Demonstration;Tactile cues;Verbal cues;Handout    Comprehension Verbalized understanding;Returned demonstration;Verbal cues required;Tactile cues required            PT Short Term Goals - 05/29/20 0829      PT SHORT TERM GOAL #1   Title Pt wll be ind in a final HEP. Achieved      PT SHORT TERM GOAL #2   Title Pt will voice understanding of measures to assist in pain reduction and management. Achieved             PT Long Term Goals -  07/06/20 1844      PT LONG TERM GOAL #1   Title Improved trunk ROM to at least 75% of normal for all movements. Achieved      PT LONG TERM GOAL #2   Title Pt's will report improved ability to complete usual work and daily activities to a little difficulty. On-going- Moderate difficulty    Status On-going      PT LONG TERM GOAL #3   Title Pt will report improved ability to stand from a chair to no difficulty. On-going- still some difficulty but less than before    Status Partially Met      PT LONG TERM GOAL #4   Title pt's FOTO survey score will improved to the predicted value of 52% limitation. On-going    Status On-going      PT LONG TERM GOAL #5   Title Pt will be ind in a final HEP. On-going    Status On-going                 Plan - 08/03/20 2011    Clinical Impression Statement PT focused on activity tolerance for ambulation without an AD re: both CV and trunk/hip tolerance. Additionally, CKC hip strengthening was completed. Today, pt demonstrated improved both CV and trunk/hip tolerance walking with an AD. Distance today increased to 555 from 347f and was limited by exertion level instead of the development of low back pain. Additionally. pt walked with an appropriate gait pattern with good step length and an upright trunk. Pt will benefit from skilled PT to address both CV and MSK issues to optimize her functional abilities.    Personal Factors and Comorbidities Comorbidity 2;Fitness    Comorbidities Deconditioning related to covid; obesity    Examination-Activity Limitations Bed Mobility;Continence;Lift;Stand;Stairs;Squat;Sleep;Sit;Locomotion Level    Stability/Clinical Decision Making Evolving/Moderate complexity    Clinical Decision Making Moderate    Rehab Potential Good    PT Duration 8 weeks    PT Treatment/Interventions ADLs/Self Care Home Management;Cryotherapy;Electrical Stimulation;Ultrasound;Traction;Moist Heat;Iontophoresis 461mml Dexamethasone;Gait  training;Stair training;Functional mobility training;Therapeutic activities;Therapeutic exercise;Balance training;Neuromuscular re-education;Manual techniques;Patient/family education;Dry needling;Energy conservation;Taping;Vasopneumatic Device    PT Next Visit Plan Lumbar/core strengthening. Continue gait training with SPC and no AD as tolerated. Side stepping and balance activities with core activation to progress towards return to dancing (discussed eventual cha  cha slide/electric slide to incorporate dynamic balance and dance steps)    PT Home Exercise Plan [id number]    Consulted and Agree with Plan of Care Patient           Patient will benefit from skilled therapeutic intervention in order to improve the following deficits and impairments:  Abnormal gait,Decreased range of motion,Difficulty walking,Decreased endurance,Increased muscle spasms,Obesity,Decreased activity tolerance,Pain,Decreased balance,Decreased mobility,Decreased strength,Postural dysfunction  Visit Diagnosis: Muscle spasm of back  Acute midline low back pain without sciatica  Other abnormalities of gait and mobility  Difficulty in walking, not elsewhere classified  DDD (degenerative disc disease), lumbosacral  Muscle weakness (generalized)  Anterolisthesis     Problem List Patient Active Problem List   Diagnosis Date Noted  . DJD (degenerative joint disease)   . Sciatica   . Osteoarthritis   . Obstructive sleep apnea 03/11/2020  . Snoring 02/05/2020  . Pneumonia due to COVID-19 virus 12/15/2019  . Acute respiratory failure with hypoxia ([place]) 12/15/2019  . Hypokalemia 12/15/2019  . Asthma without acute exacerbation 12/15/2019  . Chronic pancreatitis ([place])   . Hyperprolactinemia ([place]) 02/15/2018   Gar Ponto MS, PT 08/03/20 8:21 PM  [facility] [address] [place], [place], [address] Phone: [phone]   Fax:  [phone]  Name: Taneshia Lorence MRN: [id number] Date of Birth: 23-Jan-1976

## 2020-08-10 ENCOUNTER — Ambulatory Visit: Payer: BC Managed Care – PPO

## 2020-08-10 ENCOUNTER — Other Ambulatory Visit: Payer: Self-pay

## 2020-08-10 DIAGNOSIS — R293 Abnormal posture: Secondary | ICD-10-CM | POA: Diagnosis not present

## 2020-08-10 DIAGNOSIS — M5137 Other intervertebral disc degeneration, lumbosacral region: Secondary | ICD-10-CM

## 2020-08-10 DIAGNOSIS — M545 Low back pain, unspecified: Secondary | ICD-10-CM

## 2020-08-10 DIAGNOSIS — M62830 Muscle spasm of back: Secondary | ICD-10-CM | POA: Diagnosis not present

## 2020-08-10 DIAGNOSIS — M6281 Muscle weakness (generalized): Secondary | ICD-10-CM | POA: Diagnosis not present

## 2020-08-10 DIAGNOSIS — R2689 Other abnormalities of gait and mobility: Secondary | ICD-10-CM

## 2020-08-10 DIAGNOSIS — R262 Difficulty in walking, not elsewhere classified: Secondary | ICD-10-CM | POA: Diagnosis not present

## 2020-08-10 DIAGNOSIS — M4310 Spondylolisthesis, site unspecified: Secondary | ICD-10-CM | POA: Diagnosis not present

## 2020-08-11 DIAGNOSIS — R3915 Urgency of urination: Secondary | ICD-10-CM | POA: Diagnosis not present

## 2020-08-11 DIAGNOSIS — N3946 Mixed incontinence: Secondary | ICD-10-CM | POA: Diagnosis not present

## 2020-08-11 NOTE — Therapy (Signed)
[place], [place], [address] Phone: [phone]   Fax:  [phone]  Physical Therapy Treatment  Patient Details  Name: Beth [name] MRN: [id number] Date of Birth: [birth year] Referring Provider (PT): [name], NP   Encounter Date: 08/10/2020   PT End of Session - 08/11/20 0714    Visit Number 19    Number of Visits 24    Date for PT Re-Evaluation 09/04/20    Authorization Type BCBS COMM PPO    Progress Note Due on Visit 20    PT Start Time 1750    PT Stop Time 8502    PT Time Calculation (min) 43 min    Activity Tolerance Patient tolerated treatment well;No increased pain    Behavior During Therapy WFL for tasks assessed/performed           Past Medical History:  Diagnosis Date  . Asthma   . Chronic pancreatitis ([place])   . DDD (degenerative disc disease), lumbosacral   . DJD (degenerative joint disease)   . Hyperlipidemia   . Hypertension   . Hypoglycemia   . Obesity   . Osteoarthritis   . Prolactinoma ([place])   . Sciatica     Past Surgical History:  Procedure Laterality Date  . CHOLECYSTECTOMY    . COLONOSCOPY      There were no vitals filed for this visit.   Subjective Assessment - 08/10/20 1751    Subjective I'm doing about the same, but I'm fatigued today after a stressful day at work. pt's reports ehr R shoulder pain is better.    Pertinent History Back issue 2012 received PT and was told the issue appeared to be disc related. Covid, 6/21; obesity    Limitations Sitting;Lifting;Standing;Walking;House hold activities    Diagnostic tests MRI scheduled for 10/2. X-rays 9/8: X-rays lumbar spine reveal cholecystectomy surgical clips. She has a grade 1 anterolisthesis of L4 on L5. She has mild to moderate degenerative disc disease at L5-S1. 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    Patient Stated Goals To be active with less pain s medications    Currently in Pain? Yes     Pain Score 1     Pain Location Back    Pain Orientation Left;Lower;Lateral    Pain Descriptors / Indicators Tightness    Pain Type Chronic pain    Pain Onset More than a month ago    Pain Frequency Intermittent    Aggravating Factors  Certain abck movements    Pain Relieving Factors rest    Effect of Pain on Daily Activities Siginifcant                             [facility] Adult PT Treatment/Exercise - 08/11/20 0001      Exercises   Exercises Lumbar;Knee/Hip;Neck      Lumbar Exercises: Aerobic   Nustep L6x 5 min, UE/LE      Lumbar Exercises: Supine   Pelvic Tilt 10 reps    Pelvic Tilt Limitations 3 sec    Bridge 10 reps    Bridge Limitations c PPT      Knee/Hip Exercises: Supine   Hip Adduction Isometric Both;10 reps    Hip Adduction Isometric Limitations ball squeeze    Other Supine Knee/Hip Exercises Clams c green Tband 10x    Other Supine Knee/Hip Exercises Marching 10x each LE      Knee/Hip Exercises: Prone  Other Prone Exercises Quad/hip flexor stretch L and R 3x 20 sec      Manual Therapy   Manual Therapy Joint mobilization    Joint Mobilization Grade 3 LAD to the L and R hips                    PT Short Term Goals - 05/29/20 0829      PT SHORT TERM GOAL #1   Title Pt wll be ind in a final HEP. Achieved      PT SHORT TERM GOAL #2   Title Pt will voice understanding of measures to assist in pain reduction and management. Achieved             PT Long Term Goals - 07/06/20 1844      PT LONG TERM GOAL #1   Title Improved trunk ROM to at least 75% of normal for all movements. Achieved      PT LONG TERM GOAL #2   Title Pt's will report improved ability to complete usual work and daily activities to a little difficulty. On-going- Moderate difficulty    Status On-going      PT LONG TERM GOAL #3   Title Pt will report improved ability to stand from a chair to no difficulty. On-going- still some difficulty but less than before     Status Partially Met      PT LONG TERM GOAL #4   Title pt's FOTO survey score will improved to the predicted value of 52% limitation. On-going    Status On-going      PT LONG TERM GOAL #5   Title Pt will be ind in a final HEP. On-going    Status On-going                 Plan - 08/11/20 0717    Clinical Impression Statement Pt declined walking today due to fatigue from a stressful day at work. PT completed prone quad/hip flexor stretch which did not seem to provided much stretch and impacted her ease of breathing in prone. PT focused of  lumbopelvic strengthening. Pt's increased fatigue today also affected her ability to complete these strengthening exs, with pt completing at a slower pace. PT will complete more activity tolerance based ther ex as indicated to next PT session.    Personal Factors and Comorbidities Comorbidity 2;Fitness    Comorbidities Deconditioning related to covid; obesity    Examination-Activity Limitations Bed Mobility;Continence;Lift;Stand;Stairs;Squat;Sleep;Sit;Locomotion Level    Stability/Clinical Decision Making Evolving/Moderate complexity    Clinical Decision Making Moderate    Rehab Potential Good    PT Frequency 1x / week    PT Duration 8 weeks    PT Treatment/Interventions ADLs/Self Care Home Management;Cryotherapy;Electrical Stimulation;Ultrasound;Traction;Moist Heat;Iontophoresis 9m/ml Dexamethasone;Gait training;Stair training;Functional mobility training;Therapeutic activities;Therapeutic exercise;Balance training;Neuromuscular re-education;Manual techniques;Patient/family education;Dry needling;Energy conservation;Taping;Vasopneumatic Device    PT Next Visit Plan Complete 20 day re-assessment. Lumbar/core strengthening. Continue gait training with SPC and no AD as tolerated. Side stepping and balance activities with core activation to progress towards return to dancing (discussed eventual cha cha slide/electric slide to incorporate dynamic balance  and dance steps)    PT Home Exercise Plan [id number]    Consulted and Agree with Plan of Care Patient           Patient will benefit from skilled therapeutic intervention in order to improve the following deficits and impairments:  Abnormal gait,Decreased range of motion,Difficulty walking,Decreased endurance,Increased muscle spasms,Obesity,Decreased activity tolerance,Pain,Decreased balance,Decreased mobility,Decreased strength,Postural dysfunction  Visit  Diagnosis: Muscle spasm of back  Acute midline low back pain without sciatica  Other abnormalities of gait and mobility  Difficulty in walking, not elsewhere classified  DDD (degenerative disc disease), lumbosacral  Muscle weakness (generalized)  Anterolisthesis  Abnormal posture     Problem List Patient Active Problem List   Diagnosis Date Noted  . DJD (degenerative joint disease)   . Sciatica   . Osteoarthritis   . Obstructive sleep apnea 03/11/2020  . Snoring 02/05/2020  . Pneumonia due to COVID-19 virus 12/15/2019  . Acute respiratory failure with hypoxia ([place]) 12/15/2019  . Hypokalemia 12/15/2019  . Asthma without acute exacerbation 12/15/2019  . Chronic pancreatitis ([place])   . Hyperprolactinemia ([place]) 02/15/2018    Gar Ponto MS, PT 08/11/20 7:34 AM  [facility] [address] [place], [place], [address] Phone: [phone]   Fax:  [phone]  Name: Beth [name] MRN: [id number] Date of Birth: January 02, 1976

## 2020-08-17 ENCOUNTER — Ambulatory Visit: Payer: BC Managed Care – PPO

## 2020-08-17 ENCOUNTER — Other Ambulatory Visit: Payer: Self-pay

## 2020-08-17 DIAGNOSIS — M5137 Other intervertebral disc degeneration, lumbosacral region: Secondary | ICD-10-CM

## 2020-08-17 DIAGNOSIS — R262 Difficulty in walking, not elsewhere classified: Secondary | ICD-10-CM

## 2020-08-17 DIAGNOSIS — M4310 Spondylolisthesis, site unspecified: Secondary | ICD-10-CM

## 2020-08-17 DIAGNOSIS — R293 Abnormal posture: Secondary | ICD-10-CM

## 2020-08-17 DIAGNOSIS — R2689 Other abnormalities of gait and mobility: Secondary | ICD-10-CM

## 2020-08-17 DIAGNOSIS — M62830 Muscle spasm of back: Secondary | ICD-10-CM | POA: Diagnosis not present

## 2020-08-17 DIAGNOSIS — M6281 Muscle weakness (generalized): Secondary | ICD-10-CM | POA: Diagnosis not present

## 2020-08-17 DIAGNOSIS — M545 Low back pain, unspecified: Secondary | ICD-10-CM | POA: Diagnosis not present

## 2020-08-18 NOTE — Therapy (Signed)
Beth [name], [place], [address] Phone: [phone]   Fax:  [phone]  Physical Therapy Treatment/Progress Note  Patient Details  Name: Beth [name] MRN: [id number] Date of Birth: Aug 02, 1975 Referring Provider (PT): [name], NP   Progress Note Reporting Period 06/05/20 to 08/17/20  See note below for Objective Data and Assessment of Progress/Goals.    Encounter Date: 08/17/2020   PT End of Session - 08/17/20 1756    Visit Number 20    Number of Visits 24    Date for PT Re-Evaluation 09/04/20    Authorization Type BCBS COMM PPO    Progress Note Due on Visit 20    PT Start Time 9937    PT Stop Time 1834    PT Time Calculation (min) 42 min           Past Medical History:  Diagnosis Date  . Asthma   . Chronic pancreatitis ([place])   . DDD (degenerative disc disease), lumbosacral   . DJD (degenerative joint disease)   . Hyperlipidemia   . Hypertension   . Hypoglycemia   . Obesity   . Osteoarthritis   . Prolactinoma ([place])   . Sciatica     Past Surgical History:  Procedure Laterality Date  . CHOLECYSTECTOMY    . COLONOSCOPY      There were no vitals filed for this visit.   Subjective Assessment - 08/18/20 0803    Subjective Pt reports she is doing much better than last week.    Patient Stated Goals To be active with less pain s medications    Currently in Pain? Yes    Pain Score 1     Pain Location Back    Pain Orientation Left;Lower;Lateral    Pain Descriptors / Indicators Tightness    Pain Type Chronic pain    Pain Onset More than a month ago              [facility] PT Assessment - 08/18/20 0001      AROM   Lumbar Flexion Full    Lumbar Extension 25% limitation    Lumbar - Right Side Bend 25% limitation    Lumbar - Left Side Bend 25% limitation    Lumbar - Right Rotation Full    Lumbar - Left Rotation Full                         [facility] Adult PT  Treatment/Exercise - 08/18/20 0001      Ambulation/Gait   Gait Comments 2MWT 426f c RW      Exercises   Exercises Lumbar;Knee/Hip;Neck      Lumbar Exercises: Aerobic   Nustep L6x 5 min, UE/LE      Lumbar Exercises: Standing   Row Both;15 reps    Theraband Level (Row) Level 3 (Green)    Shoulder Extension Right;Left;15 reps    Theraband Level (Shoulder Extension) Level 3 (Green)    Other Standing Lumbar Exercises Elbow flexion ; 10x2; Green Tband; Palloff press 10x each direction    Other Standing Lumbar Exercises Shoulder flexion to 90 10x2; 2 lbs each UE      Knee/Hip Exercises: Standing   Hip Extension Right;Left;2 sets;10 reps    Extension Limitations plantargrade                  PT Education - 08/18/20 0804    Education Details HEP for UE and posterior cahin strengthening  Person(s) Educated Patient    Methods Explanation;Demonstration;Tactile cues;Verbal cues    Comprehension Verbalized understanding;Returned demonstration;Verbal cues required;Tactile cues required;Need further instruction            PT Short Term Goals - 05/29/20 0829      PT SHORT TERM GOAL #1   Title Pt wll be ind in a final HEP. Achieved      PT SHORT TERM GOAL #2   Title Pt will voice understanding of measures to assist in pain reduction and management. Achieved             PT Long Term Goals - 07/06/20 1844      PT LONG TERM GOAL #1   Title Improved trunk ROM to at least 75% of normal for all movements. Achieved      PT LONG TERM GOAL #2   Title Pt's will report improved ability to complete usual work and daily activities to a little difficulty. On-going- Moderate difficulty    Status On-going      PT LONG TERM GOAL #3   Title Pt will report improved ability to stand from a chair to no difficulty. On-going- still some difficulty but less than before    Status Partially Met      PT LONG TERM GOAL #4   Title pt's FOTO survey score will improved to the predicted value  of 52% limitation. On-going    Status On-going      PT LONG TERM GOAL #5   Title Pt will be ind in a final HEP. On-going    Status On-going                 Plan - 08/18/20 0805    Clinical Impression Statement PT was provided for activity tolerance and UE/posterior chain strengthening. With the 2MWT, Pt demonstrated increased pace and distance of 435f c RW in comparison to the last test in mid-December and much improved vs the initial test of 270 ft c RW. Pt tolerated to focus of posterior chain strength without adverse effects. Re-assessment of pt's trunk ROM found trunk mobility to continued to be improved.    Personal Factors and Comorbidities Comorbidity 2;Fitness    Comorbidities Deconditioning related to covid; obesity    Examination-Activity Limitations Bed Mobility;Continence;Lift;Stand;Stairs;Squat;Sleep;Sit;Locomotion Level    Stability/Clinical Decision Making Evolving/Moderate complexity    Rehab Potential Good    PT Frequency 1x / week    PT Duration 8 weeks    PT Treatment/Interventions ADLs/Self Care Home Management;Cryotherapy;Electrical Stimulation;Ultrasound;Traction;Moist Heat;Iontophoresis 425mml Dexamethasone;Gait training;Stair training;Functional mobility training;Therapeutic activities;Therapeutic exercise;Balance training;Neuromuscular re-education;Manual techniques;Patient/family education;Dry needling;Energy conservation;Taping;Vasopneumatic Device    PT Next Visit Plan Will continue to address lumbopelvic/thoracic/core/LE strengtheing and flexibility as well as giat tolerance without the use the RW. Will establish final HEP with 2 visits remaining.    PT Home Exercise Plan [id number]           Patient will benefit from skilled therapeutic intervention in order to improve the following deficits and impairments:  Abnormal gait,Decreased range of motion,Difficulty walking,Decreased endurance,Increased muscle spasms,Obesity,Decreased activity  tolerance,Pain,Decreased balance,Decreased mobility,Decreased strength,Postural dysfunction  Visit Diagnosis: Muscle spasm of back  Acute midline low back pain without sciatica  Other abnormalities of gait and mobility  Difficulty in walking, not elsewhere classified  DDD (degenerative disc disease), lumbosacral  Muscle weakness (generalized)  Anterolisthesis  Abnormal posture     Problem List Patient Active Problem List   Diagnosis Date Noted  . DJD (degenerative joint disease)   . Sciatica   .  Osteoarthritis   . Obstructive sleep apnea 03/11/2020  . Snoring 02/05/2020  . Pneumonia due to COVID-19 virus 12/15/2019  . Acute respiratory failure with hypoxia ([place]) 12/15/2019  . Hypokalemia 12/15/2019  . Asthma without acute exacerbation 12/15/2019  . Chronic pancreatitis ([place])   . Hyperprolactinemia ([place]) 02/15/2018    Gar Ponto MS, PT 08/18/20 8:22 AM  [facility] [address] [place], [place], [address] Phone: [phone]   Fax:  [phone]  Name: Beth [name] MRN: [id number] Date of Birth: [birth year]

## 2020-08-18 NOTE — Patient Instructions (Signed)
Palloff press and  Bicep curls c Tband

## 2020-08-20 DIAGNOSIS — Z01419 Encounter for gynecological examination (general) (routine) without abnormal findings: Secondary | ICD-10-CM | POA: Diagnosis not present

## 2020-08-20 DIAGNOSIS — Z124 Encounter for screening for malignant neoplasm of cervix: Secondary | ICD-10-CM | POA: Diagnosis not present

## 2020-08-20 DIAGNOSIS — M1990 Unspecified osteoarthritis, unspecified site: Secondary | ICD-10-CM | POA: Insufficient documentation

## 2020-08-20 DIAGNOSIS — Z1239 Encounter for other screening for malignant neoplasm of breast: Secondary | ICD-10-CM | POA: Diagnosis not present

## 2020-08-24 ENCOUNTER — Other Ambulatory Visit: Payer: Self-pay

## 2020-08-24 ENCOUNTER — Ambulatory Visit: Payer: BC Managed Care – PPO | Attending: Primary Care

## 2020-08-24 DIAGNOSIS — M4310 Spondylolisthesis, site unspecified: Secondary | ICD-10-CM | POA: Insufficient documentation

## 2020-08-24 DIAGNOSIS — R293 Abnormal posture: Secondary | ICD-10-CM

## 2020-08-24 DIAGNOSIS — M62830 Muscle spasm of back: Secondary | ICD-10-CM

## 2020-08-24 DIAGNOSIS — R2689 Other abnormalities of gait and mobility: Secondary | ICD-10-CM | POA: Diagnosis not present

## 2020-08-24 DIAGNOSIS — R262 Difficulty in walking, not elsewhere classified: Secondary | ICD-10-CM | POA: Insufficient documentation

## 2020-08-24 DIAGNOSIS — M5137 Other intervertebral disc degeneration, lumbosacral region: Secondary | ICD-10-CM | POA: Insufficient documentation

## 2020-08-24 DIAGNOSIS — M545 Low back pain, unspecified: Secondary | ICD-10-CM | POA: Diagnosis not present

## 2020-08-24 DIAGNOSIS — M6281 Muscle weakness (generalized): Secondary | ICD-10-CM

## 2020-08-24 NOTE — Therapy (Signed)
[place], [place], [address] Phone: [phone]   Fax:  [phone]  Physical Therapy Treatment  Patient Details  Name: Beth [name] MRN: [id number] Date of Birth: [birth year] Referring Provider (PT): [name], NP   Encounter Date: 08/24/2020   PT End of Session - 08/24/20 2004    Visit Number 21    Number of Visits 24    Date for PT Re-Evaluation 09/04/20    Authorization Type BCBS COMM PPO    PT Start Time 6553    PT Stop Time 1835    PT Time Calculation (min) 45 min    Activity Tolerance Patient tolerated treatment well;No increased pain    Behavior During Therapy WFL for tasks assessed/performed           Past Medical History:  Diagnosis Date  . Asthma   . Chronic pancreatitis ([place])   . DDD (degenerative disc disease), lumbosacral   . DJD (degenerative joint disease)   . Hyperlipidemia   . Hypertension   . Hypoglycemia   . Obesity   . Osteoarthritis   . Prolactinoma ([place])   . Sciatica     Past Surgical History:  Procedure Laterality Date  . CHOLECYSTECTOMY    . COLONOSCOPY      There were no vitals filed for this visit.   Subjective Assessment - 08/24/20 1803    Subjective R shoulder is doing better, a twinge with certain movements. Recommendations for sleeping positions and exercises have been very helpful.    Diagnostic tests MRI scheduled for 10/2. X-rays 9/8: X-rays lumbar spine reveal cholecystectomy surgical clips. She has a grade 1 anterolisthesis of L4 on L5. She has mild to moderate degenerative disc disease at L5-S1. 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    Patient Stated Goals To be active with less pain s medications    Currently in Pain? Yes    Pain Score 1     Pain Location Back    Pain Orientation Left;Lower    Pain Descriptors / Indicators Tightness    Pain Type Chronic pain    Pain Onset More than a month ago    Pain Frequency Intermittent               [facility] PT Assessment - 08/24/20 0001      Ambulation/Gait   Gait Comments walking for distance s rollator 1040 ft. Distance was limited due to [place] Adult PT Treatment/Exercise - 08/24/20 0001      Lumbar Exercises: Standing   Row Both;10 reps    Theraband Level (Row) Level 3 (Green)    Shoulder Extension Both;10 reps    Theraband Level (Shoulder Extension) Level 3 (Green)    Other Standing Lumbar Exercises Elbow flexion ; 10x2; Green Tband; Palloff press 10x each direction    Other Standing Lumbar Exercises Shoulder IR and ER 10x2 c green tband; Shoulder flexion to 90 10x2; 2 lbs each UE                  PT Education - 08/24/20 2003    Education Details HEP.    Person(s) Educated Patient    Methods Explanation;Demonstration;Tactile cues;Verbal cues;Handout    Comprehension Verbalized understanding;Returned demonstration;Verbal cues required;Tactile cues required            PT  Short Term Goals - 05/29/20 0829      PT SHORT TERM GOAL #1   Title Pt wll be ind in a final HEP. Achieved      PT SHORT TERM GOAL #2   Title Pt will voice understanding of measures to assist in pain reduction and management. Achieved             PT Long Term Goals - 07/06/20 1844      PT LONG TERM GOAL #1   Title Improved trunk ROM to at least 75% of normal for all movements. Achieved      PT LONG TERM GOAL #2   Title Pt's will report improved ability to complete usual work and daily activities to a little difficulty. On-going- Moderate difficulty    Status On-going      PT LONG TERM GOAL #3   Title Pt will report improved ability to stand from a chair to no difficulty. On-going- still some difficulty but less than before    Status Partially Met      PT LONG TERM GOAL #4   Title pt's FOTO survey score will improved to the predicted value of 52% limitation. On-going    Status On-going      PT LONG TERM GOAL #5   Title Pt  will be ind in a final HEP. On-going    Status On-going                 Plan - 08/24/20 2005    Clinical Impression Statement PT was completed for activity tolerance, shoulder/posterior chain strengthening, and development/education with HEP. Pt walked her longest distance in a single attempt without the rollator. Pt demonstrated proper posture and management of pacing re: her back and CV exertion level. Distance was limited by DOE. Pt completed shoulder and posterior chain strengthening exs with proper technique.    Personal Factors and Comorbidities Comorbidity 2;Fitness    Comorbidities Deconditioning related to covid; obesity    Examination-Activity Limitations Bed Mobility;Continence;Lift;Stand;Stairs;Squat;Sleep;Sit;Locomotion Level    Stability/Clinical Decision Making Evolving/Moderate complexity    Clinical Decision Making Moderate    Rehab Potential Good    PT Frequency 1x / week    PT Duration 8 weeks    PT Treatment/Interventions ADLs/Self Care Home Management;Cryotherapy;Electrical Stimulation;Ultrasound;Traction;Moist Heat;Iontophoresis 23m/ml Dexamethasone;Gait training;Stair training;Functional mobility training;Therapeutic activities;Therapeutic exercise;Balance training;Neuromuscular re-education;Manual techniques;Patient/family education;Dry needling;Energy conservation;Taping;Vasopneumatic Device    PT Next Visit Plan Will continue to address lumbopelvic/thoracic/core/LE strengtheing and flexibility as well as giat tolerance without the use the RW. Continue to establish final HEP. Anticipate DC the next visit    PT Home Exercise Plan [id number]    Consulted and Agree with Plan of Care Patient           Patient will benefit from skilled therapeutic intervention in order to improve the following deficits and impairments:  Abnormal gait,Decreased range of motion,Difficulty walking,Decreased endurance,Increased muscle spasms,Obesity,Decreased activity  tolerance,Pain,Decreased balance,Decreased mobility,Decreased strength,Postural dysfunction  Visit Diagnosis: Muscle spasm of back  Acute midline low back pain without sciatica  Other abnormalities of gait and mobility  Difficulty in walking, not elsewhere classified  DDD (degenerative disc disease), lumbosacral  Muscle weakness (generalized)  Anterolisthesis  Abnormal posture     Problem List Patient Active Problem List   Diagnosis Date Noted  . DJD (degenerative joint disease)   . Sciatica   . Osteoarthritis   . Obstructive sleep apnea 03/11/2020  . Snoring 02/05/2020  . Pneumonia due to COVID-19 virus 12/15/2019  . Acute respiratory  failure with hypoxia ([place]) 12/15/2019  . Hypokalemia 12/15/2019  . Asthma without acute exacerbation 12/15/2019  . Chronic pancreatitis ([place])   . Hyperprolactinemia ([place]) 02/15/2018    Gar Ponto MS, PT 08/24/20 8:17 PM  [facility] [address] [place], [place], [address] Phone: [phone]   Fax:  [phone]  Name: Ellianna Ruest MRN: [id number] Date of Birth: 11-Dec-1975

## 2020-08-31 ENCOUNTER — Other Ambulatory Visit: Payer: Self-pay

## 2020-08-31 ENCOUNTER — Ambulatory Visit: Payer: BC Managed Care – PPO

## 2020-08-31 DIAGNOSIS — M6281 Muscle weakness (generalized): Secondary | ICD-10-CM | POA: Diagnosis not present

## 2020-08-31 DIAGNOSIS — R262 Difficulty in walking, not elsewhere classified: Secondary | ICD-10-CM | POA: Diagnosis not present

## 2020-08-31 DIAGNOSIS — R2689 Other abnormalities of gait and mobility: Secondary | ICD-10-CM | POA: Diagnosis not present

## 2020-08-31 DIAGNOSIS — M545 Low back pain, unspecified: Secondary | ICD-10-CM

## 2020-08-31 DIAGNOSIS — M4310 Spondylolisthesis, site unspecified: Secondary | ICD-10-CM

## 2020-08-31 DIAGNOSIS — R293 Abnormal posture: Secondary | ICD-10-CM

## 2020-08-31 DIAGNOSIS — M5137 Other intervertebral disc degeneration, lumbosacral region: Secondary | ICD-10-CM | POA: Diagnosis not present

## 2020-08-31 DIAGNOSIS — M62830 Muscle spasm of back: Secondary | ICD-10-CM

## 2020-09-01 NOTE — Therapy (Signed)
[place], [place], [address] Phone: [phone]   Fax:  [phone]  Physical Therapy Treatment/Discharge  Patient Details  Name: Beth [name] MRN: [id number] Date of Birth: [birth year] Referring Provider (PT): [name], NP   Encounter Date: 08/31/2020   PT End of Session - 09/01/20 0656    Visit Number 22    Number of Visits 24    Date for PT Re-Evaluation 09/04/20    Authorization Type BCBS COMM PPO    PT Start Time 2297    PT Stop Time 1834    PT Time Calculation (min) 49 min    Equipment Utilized During Treatment --   rollator   Activity Tolerance Patient tolerated treatment well;No increased pain    Behavior During Therapy WFL for tasks assessed/performed           Past Medical History:  Diagnosis Date  . Asthma   . Chronic pancreatitis ([place])   . DDD (degenerative disc disease), lumbosacral   . DJD (degenerative joint disease)   . Hyperlipidemia   . Hypertension   . Hypoglycemia   . Obesity   . Osteoarthritis   . Prolactinoma ([place])   . Sciatica     Past Surgical History:  Procedure Laterality Date  . CHOLECYSTECTOMY    . COLONOSCOPY      There were no vitals filed for this visit.   Subjective Assessment - 08/31/20 1753    Subjective Pt reports she is doing well. Low back pain and function are about the same. Pt reports she did go to [place] this past weekend and attend a basketball game. Pt reports walking 7215 steps that day.    Pertinent History Back issue 2012 received PT and was told the issue appeared to be disc related. Covid, 6/21; obesity    Limitations Sitting;Lifting;Standing;Walking;House hold activities    How long can you walk comfortably? 2000 steps in the course of a day    Diagnostic tests MRI scheduled for 10/2. X-rays 9/8: X-rays lumbar spine reveal cholecystectomy surgical clips. She has a grade 1 anterolisthesis of L4 on L5. She has mild to  moderate degenerative disc disease at L5-S1. 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    Patient Stated Goals To be active with less pain s medications    Currently in Pain? Yes    Pain Score 1     Pain Location Back    Pain Orientation Left;Lower    Pain Descriptors / Indicators Tightness    Pain Type Chronic pain    Pain Onset More than a month ago    Pain Frequency Intermittent    Aggravating Factors  Certain back movements    Pain Relieving Factors Rest    Effect of Pain on Daily Activities Significant    Multiple Pain Sites No              [facility] PT Assessment - 09/01/20 0001      Observation/Other Assessments   Focus on Therapeutic Outcomes (FOTO)  34% functional ability                         [facility] Adult PT Treatment/Exercise - 09/01/20 0001      Lumbar Exercises: Aerobic   Nustep L6x 5 min, UE/LE      Lumbar Exercises: Standing   Row Both;15 reps    Theraband Level (Row) Level 3 (Green)    Shoulder Extension Both;15  reps    Theraband Level (Shoulder Extension) Level 3 (Green)    Other Standing Lumbar Exercises Elbow flexion ; 15; Green Tband; Palloff press 15x each direction    Other Standing Lumbar Exercises Shoulder IR and ER 15x c green tband; Shoulder flexion to 90 15; 2 lbs each UE      Manual Therapy   Manual Therapy Joint mobilization    Joint Mobilization Grade 3 LAD to the L and R hips                  PT Education - 09/01/20 0656    Education Details Final HEP    Person(s) Educated Patient    Methods Explanation;Demonstration;Tactile cues;Verbal cues;Handout    Comprehension Verbalized understanding;Returned demonstration            PT Short Term Goals - 05/29/20 0829      PT SHORT TERM GOAL #1   Title Pt wll be ind in a final HEP. Achieved      PT SHORT TERM GOAL #2   Title Pt will voice understanding of measures to assist in pain reduction and management. Achieved             PT Long Term Goals - 09/01/20  4562      PT LONG TERM GOAL #1   Title Improved trunk ROM to at least 75% of normal for all movements. Achieved    Baseline See flowsheets    Status Achieved    Target Date 05/05/20      PT LONG TERM GOAL #2   Title Pt's will report improved ability to complete usual work and daily activities to a little difficulty. On-going- Moderate difficulty. 08/31/20: Partially met- Pt is able to complete work activities c little difficuly, while some daily, household care activites, are moderately difficult.    Baseline Extreme difficulty    Status Partially Met    Target Date 08/31/20      PT LONG TERM GOAL #3   Title Pt will report improved ability to stand from a chair to no difficulty. On-going- still some difficulty but less than before. Partially Met: 08/31/20: No difficulty from a standard chair c armrest. Moderate difficulty from a soft chair.    Baseline moderate difficulty    Status Partially Met    Target Date 08/31/20      PT LONG TERM GOAL #4   Title pt's FOTO survey score will improved to the predicted value of 52% limitation. On-going 08/31/20: 08/31/20; Not met-66% limitation    Baseline 74% limitation    Status Not Met    Target Date 08/31/20      PT LONG TERM GOAL #5   Title Pt will be ind in a final HEP. On-going. 08/31/20: Met    Status Achieved    Target Date 08/31/20      PT LONG TERM GOAL #6   Title Increase walking distance to 2500 steps to assist with shopping in community. 08/31/20:Met- Pt walks on a normal day 2100 steps, but is able to walk 2500+ steps as needed.    Baseline 2000 steps    Status Achieved                 Plan - 09/01/20 0658    Clinical Impression Statement PT is DCed from [place] good progress over her initial presentation. low back pain is consistently a 1/10 and pt manages with a HEP. Activity tolerance and function has improved Re: pace, tolerance, and walking  intermittently s a rollator. Pt is still experiencing DOE and elevated HR with  activity. Pt has learned to manage her tolerance per pacing and pursed lip breathing. Pt's tolerance is still being impacted by her bout c covid. As a reflection of her progres, last weekend, the pt was able to make a trip to [place] and attend a basketball game using her rollator for assistance. The day required a lot of walking and the pt completed 7215 steps. Pt is DCed from PT with her goals partially met. Pt is Ind in a HEP to maintain/progress her achieved LOF.    Personal Factors and Comorbidities Comorbidity 2;Fitness    Comorbidities Deconditioning related to covid; obesity    Examination-Activity Limitations Bed Mobility;Continence;Lift;Stand;Stairs;Squat;Sleep;Sit;Locomotion Level    Stability/Clinical Decision Making Evolving/Moderate complexity    Clinical Decision Making Moderate    Rehab Potential Good    PT Frequency 1x / week    PT Duration 8 weeks    PT Treatment/Interventions ADLs/Self Care Home Management;Cryotherapy;Electrical Stimulation;Ultrasound;Traction;Moist Heat;Iontophoresis 25m/ml Dexamethasone;Gait training;Stair training;Functional mobility training;Therapeutic activities;Therapeutic exercise;Balance training;Neuromuscular re-education;Manual techniques;Patient/family education;Dry needling;Energy conservation;Taping;Vasopneumatic Device    PT Home Exercise Plan [id number]    Consulted and Agree with Plan of Care Patient           Patient will benefit from skilled therapeutic intervention in order to improve the following deficits and impairments:  Abnormal gait,Decreased range of motion,Difficulty walking,Decreased endurance,Increased muscle spasms,Obesity,Decreased activity tolerance,Pain,Decreased balance,Decreased mobility,Decreased strength,Postural dysfunction  Visit Diagnosis: Acute midline low back pain without sciatica  Muscle spasm of back  Other abnormalities of gait and mobility  Difficulty in walking, not elsewhere classified  DDD (degenerative  disc disease), lumbosacral  Muscle weakness (generalized)  Anterolisthesis  Abnormal posture     Problem List Patient Active Problem List   Diagnosis Date Noted  . DJD (degenerative joint disease)   . Sciatica   . Osteoarthritis   . Obstructive sleep apnea 03/11/2020  . Snoring 02/05/2020  . Pneumonia due to COVID-19 virus 12/15/2019  . Acute respiratory failure with hypoxia ([place] 12/15/2019  . Hypokalemia 12/15/2019  . Asthma without acute exacerbation 12/15/2019  . [place] ([place]   . [place]prolactinemia ([place] 02/15/2018    AGar PontoMS, PT 09/01/20 7:30 AM  [facility] [address]Phone: [phone]  Fax:  [phone] Name: Beth LevitanMRN: [id number] of Birth: [birth year]  PHYSICAL THERAPY DISCHARGE SUMMARY  Visits from Start of Care: 22  Current functional level related to goals / functional outcomes: See above   Remaining deficits: See above   Education / Equipment: HEP  Plan: Patient agrees to discharge.  Patient goals were partially met. Patient is being discharged due to being pleased with the current functional level.  ?????

## 2020-09-16 DIAGNOSIS — Z1231 Encounter for screening mammogram for malignant neoplasm of breast: Secondary | ICD-10-CM | POA: Diagnosis not present

## 2020-10-01 DIAGNOSIS — A599 Trichomoniasis, unspecified: Secondary | ICD-10-CM | POA: Diagnosis not present

## 2020-11-02 ENCOUNTER — Other Ambulatory Visit (INDEPENDENT_AMBULATORY_CARE_PROVIDER_SITE_OTHER): Payer: Self-pay | Admitting: Primary Care

## 2020-11-02 ENCOUNTER — Other Ambulatory Visit: Payer: Self-pay | Admitting: Family Medicine

## 2020-11-02 DIAGNOSIS — I10 Essential (primary) hypertension: Secondary | ICD-10-CM

## 2020-11-02 NOTE — Telephone Encounter (Signed)
Pt will need to be seen for further refills. Requested Prescriptions  Pending Prescriptions Disp Refills  . hydrochlorothiazide (HYDRODIURIL) 12.5 MG tablet [Pharmacy Med Name: HYDROCHLOROTHIAZIDE 12.5MG  TABLETS] 90 tablet 0    Sig: TAKE 1 TABLET(12.5 MG) BY MOUTH DAILY     Cardiovascular: Diuretics - Thiazide Passed - 11/02/2020  7:19 PM      Passed - Ca in normal range and within 360 days    Calcium  Date Value Ref Range Status  01/02/2020 9.1 8.7 - 10.2 mg/dL Final         Passed - Cr in normal range and within 360 days    Creatinine, Ser  Date Value Ref Range Status  01/02/2020 0.84 0.57 - 1.00 mg/dL Final         Passed - K in normal range and within 360 days    Potassium  Date Value Ref Range Status  01/02/2020 4.0 3.5 - 5.2 mmol/L Final         Passed - Na in normal range and within 360 days    Sodium  Date Value Ref Range Status  01/02/2020 140 134 - 144 mmol/L Final         Passed - Last BP in normal range    BP Readings from Last 1 Encounters:  06/16/20 114/84         Passed - Valid encounter within last 6 months    Recent Outpatient Visits          4 months ago Onychomycosis   [place] [name], NP   9 months ago Tachycardia   [place] [name], NP   10 months ago Pneumonia due to COVID-19 virus   [place], Michelle P, NP      Future Appointments            In 1 month [place], Rose Fill[place], MD  Pulmonary Care   In 5 months [place], [place], DO [facility]

## 2020-11-10 DIAGNOSIS — N3941 Urge incontinence: Secondary | ICD-10-CM | POA: Diagnosis not present

## 2020-11-10 DIAGNOSIS — N3281 Overactive bladder: Secondary | ICD-10-CM | POA: Diagnosis not present

## 2020-11-23 DIAGNOSIS — Z20822 Contact with and (suspected) exposure to covid-19: Secondary | ICD-10-CM | POA: Diagnosis not present

## 2020-11-25 DIAGNOSIS — J0100 Acute maxillary sinusitis, unspecified: Secondary | ICD-10-CM | POA: Diagnosis not present

## 2020-12-21 DIAGNOSIS — M6289 Other specified disorders of muscle: Secondary | ICD-10-CM | POA: Diagnosis not present

## 2020-12-21 DIAGNOSIS — M6281 Muscle weakness (generalized): Secondary | ICD-10-CM | POA: Diagnosis not present

## 2020-12-21 DIAGNOSIS — M62838 Other muscle spasm: Secondary | ICD-10-CM | POA: Diagnosis not present

## 2020-12-21 DIAGNOSIS — N3946 Mixed incontinence: Secondary | ICD-10-CM | POA: Diagnosis not present

## 2020-12-22 ENCOUNTER — Ambulatory Visit: Payer: BC Managed Care – PPO | Admitting: Emergency Medicine

## 2020-12-22 ENCOUNTER — Encounter: Payer: Self-pay | Admitting: Emergency Medicine

## 2020-12-22 ENCOUNTER — Other Ambulatory Visit: Payer: Self-pay

## 2020-12-22 DIAGNOSIS — U071 COVID-19: Secondary | ICD-10-CM | POA: Diagnosis not present

## 2020-12-22 DIAGNOSIS — J45909 Unspecified asthma, uncomplicated: Secondary | ICD-10-CM | POA: Diagnosis not present

## 2020-12-22 DIAGNOSIS — J1282 Pneumonia due to coronavirus disease 2019: Secondary | ICD-10-CM

## 2020-12-22 DIAGNOSIS — G4733 Obstructive sleep apnea (adult) (pediatric): Secondary | ICD-10-CM

## 2020-12-22 NOTE — Assessment & Plan Note (Signed)
Overall stable currently.  She did have a flare in May with more cough, more dyspnea.  She was treated with steroids and antibiotics.  Currently functional capacity is a bit better than it was at her last visit.  Plan to continue albuterol as needed.  Hold off on scheduled BD therapy or ICS for now.

## 2020-12-22 NOTE — Assessment & Plan Note (Signed)
Tolerating CPAP.  Wearing it reliably with good compliance.  Getting a good clinical benefit with less daytime sleepiness and fewer nocturnal awakenings as long she is using it.  Plan to continue same

## 2020-12-22 NOTE — Patient Instructions (Addendum)
Please keep your albuterol available use 2 puffs when needed for shortness of breath, chest tightness, wheezing. Continue to work on your exercise and conditioning as you have been doing. Continue your CPAP every night We will repeat your chest x-ray at your next office visit Follow with Dr Lamonte Sakai in 6 months or sooner if you have any problems

## 2020-12-22 NOTE — Assessment & Plan Note (Signed)
With some associated residual right basilar scar on most recent chest x-ray.  No progression.  I do not think we need to do a CT right now.  I will repeat her chest x-ray at her next office visit which would be about 1 year interval.

## 2020-12-22 NOTE — Progress Notes (Signed)
Subjective:    Patient ID: Beth [name], female    DOB: 25-Mar-1976, 45 y.o.   MRN: [id number]  HPI  ROV 06/16/20 --Beth [name] is 45, follows up today for her asthma and her newly diagnosed obstructive sleep apnea.  At her last visit in September we set her up for an AutoSet CPAP.  She reports that she has used the CPAP and feels better with it. She forgot to wear on one occasion and did not feel rested. Her naps have decreased in frequency, more awake and energetic during the day. She is working with PT. Still some SOB with walking and talking. Has not needed or used albuterol. Occasional cough, some allergy congestion.   Pulmonary function testing was done 03/22/2020 and I have reviewed, shows principally restriction with possible superimposed obstruction, no bronchodilator response.  The lung volumes confirm restriction.  Diffusion capacity is normal.  Airview download is available for review from 11/22-12/21/2021.  This shows great compliance with her CPAP, 97% of the days used more than 4 hours, 1 day she used it for less than 4 hours.  Median pressure is 8 cmH2O.  Minimal leak and great control of events.  ROV 12/22/20 --this is a follow-up visit for 45 year old woman with a history of asthma and obstructive sleep apnea.  Also with some residual mild interstitial infiltrates on chest x-ray following COVID-19 infection. She reports today that she is a bit better, having less SOB but still struggles with stairs. She has completed PT. She is using albuterol a few times a week.  She reports that she was treated with abx, steroids, used her tessalon, albuterol for nasal congestion, cough, chest rattling in May. Still w some cough and clear mucous, some nasal congestion. She is using her CPAP every night. She believes that she is benefiting - has less daytime sleepiness. She sleeps through the night on most nights.    Review of Systems As per HPI     Objective:   Physical Exam Vitals:    12/22/20 1622  BP: 118/74  Pulse: 83  Temp: 98.5 F (36.9 C)  TempSrc: Temporal  SpO2: 98%  Weight: 277 lb 12.8 oz (126 kg)  Height: 5\' 9"  (1.753 m)   Gen: Pleasant, obese woman, in no distress,  normal affect  ENT: No lesions,  mouth clear,  oropharynx clear, no postnasal drip  Neck: No JVD, no stridor  Lungs: No use of accessory muscles, no wheeze or crackles.   Cardiovascular: RRR, heart sounds normal, no murmur or gallops, no peripheral edema  Musculoskeletal: No deformities, no cyanosis or clubbing  Neuro: alert, awake, non focal  Skin: Warm, no lesions or rash      Assessment & Plan:  Asthma without acute exacerbation Overall stable currently.  She did have a flare in May with more cough, more dyspnea.  She was treated with steroids and antibiotics.  Currently functional capacity is a bit better than it was at her last visit.  Plan to continue albuterol as needed.  Hold off on scheduled BD therapy or ICS for now.  Pneumonia due to COVID-19 virus With some associated residual right basilar scar on most recent chest x-ray.  No progression.  I do not think we need to do a CT right now.  I will repeat her chest x-ray at her next office visit which would be about 1 year interval.  Obstructive sleep apnea Tolerating CPAP.  Wearing it reliably with good compliance.  Getting a good clinical benefit  with less daytime sleepiness and fewer nocturnal awakenings as long she is using it.  Plan to continue same   Baltazar Apo, MD, PhD 12/22/2020, 4:43 PM [facility] [phone] or if no answer [phone]

## 2021-01-13 DIAGNOSIS — N3281 Overactive bladder: Secondary | ICD-10-CM | POA: Diagnosis not present

## 2021-01-13 DIAGNOSIS — R3915 Urgency of urination: Secondary | ICD-10-CM | POA: Diagnosis not present

## 2021-01-13 DIAGNOSIS — N3946 Mixed incontinence: Secondary | ICD-10-CM | POA: Diagnosis not present

## 2021-01-13 DIAGNOSIS — N3941 Urge incontinence: Secondary | ICD-10-CM | POA: Diagnosis not present

## 2021-01-20 DIAGNOSIS — R3915 Urgency of urination: Secondary | ICD-10-CM | POA: Diagnosis not present

## 2021-01-20 DIAGNOSIS — N3946 Mixed incontinence: Secondary | ICD-10-CM | POA: Diagnosis not present

## 2021-01-20 DIAGNOSIS — N3941 Urge incontinence: Secondary | ICD-10-CM | POA: Diagnosis not present

## 2021-01-20 DIAGNOSIS — N3281 Overactive bladder: Secondary | ICD-10-CM | POA: Diagnosis not present

## 2021-02-01 ENCOUNTER — Other Ambulatory Visit (INDEPENDENT_AMBULATORY_CARE_PROVIDER_SITE_OTHER): Payer: Self-pay | Admitting: Primary Care

## 2021-02-01 DIAGNOSIS — I10 Essential (primary) hypertension: Secondary | ICD-10-CM

## 2021-02-01 NOTE — Telephone Encounter (Signed)
Requested medication (s) are due for refill today: yes  Requested medication (s) are on the active medication list:yes  Last refill:  01/01/2021  Future visit scheduled:no  Notes to clinic:  message sent to patient to contact office for appt   Failed Protocol: Ca in normal range and within 360 days   Cr in normal range and within 360 days   K in normal range and within 360 days   Na in normal range and within 360 days   Valid encounter within last 6 months     Requested Prescriptions  Pending Prescriptions Disp Refills   hydrochlorothiazide (HYDRODIURIL) 12.5 MG tablet [Pharmacy Med Name: HYDROCHLOROTHIAZIDE 12.'5MG'$  TABLETS] 90 tablet 0    Sig: TAKE 1 TABLET(12.5 MG) BY MOUTH DAILY      Cardiovascular: Diuretics - Thiazide Failed - 02/01/2021  8:04 AM      Failed - Ca in normal range and within 360 days    Calcium  Date Value Ref Range Status  01/02/2020 9.1 8.7 - 10.2 mg/dL Final          Failed - Cr in normal range and within 360 days    Creatinine, Ser  Date Value Ref Range Status  01/02/2020 0.84 0.57 - 1.00 mg/dL Final          Failed - K in normal range and within 360 days    Potassium  Date Value Ref Range Status  01/02/2020 4.0 3.5 - 5.2 mmol/L Final          Failed - Na in normal range and within 360 days    Sodium  Date Value Ref Range Status  01/02/2020 140 134 - 144 mmol/L Final          Failed - Valid encounter within last 6 months    Recent Outpatient Visits           7 months ago Onychomycosis   [place], Michelle P, NP   1 year ago Tachycardia   [place] [name], NP   1 year ago Pneumonia due to COVID-19 virus   [place], [place], NP       Future Appointments             In 1 month [place] [facility], [facility]             Passed - Last BP in normal range    BP Readings from Last 1 Encounters:  12/22/20  118/74

## 2021-02-01 NOTE — Telephone Encounter (Signed)
Sent to PCP to refill if appropriate.  

## 2021-02-02 DIAGNOSIS — G4733 Obstructive sleep apnea (adult) (pediatric): Secondary | ICD-10-CM | POA: Diagnosis not present

## 2021-02-23 ENCOUNTER — Other Ambulatory Visit: Payer: Self-pay

## 2021-02-23 ENCOUNTER — Ambulatory Visit (INDEPENDENT_AMBULATORY_CARE_PROVIDER_SITE_OTHER): Payer: BC Managed Care – PPO | Admitting: Primary Care

## 2021-02-23 ENCOUNTER — Encounter (INDEPENDENT_AMBULATORY_CARE_PROVIDER_SITE_OTHER): Payer: Self-pay | Admitting: Primary Care

## 2021-02-23 VITALS — BP 101/69 | HR 67 | Temp 97.5°F | Ht 69.0 in | Wt 273.6 lb

## 2021-02-23 DIAGNOSIS — I10 Essential (primary) hypertension: Secondary | ICD-10-CM | POA: Diagnosis not present

## 2021-02-23 DIAGNOSIS — Z6831 Body mass index (BMI) 31.0-31.9, adult: Secondary | ICD-10-CM

## 2021-02-23 DIAGNOSIS — R5381 Other malaise: Secondary | ICD-10-CM | POA: Diagnosis not present

## 2021-02-23 NOTE — Progress Notes (Signed)
[place]   [name] is a 45 y.o. female presents for hypertension evaluation, Denies shortness of breath, headaches, chest pain or lower extremity edema, sudden onset, vision changes, unilateral weakness, dizziness, paresthesias   Patient reports adherence with medications.  Dietary habits include: healthy diet  Exercise habits include:walking , increasing steps Family / Social history: Yes maternal grandparents MI   Past Medical History:  Diagnosis Date   Asthma    Chronic pancreatitis ([place])    DDD (degenerative disc disease), lumbosacral    DJD (degenerative joint disease)    Hyperlipidemia    Hypertension    Hypoglycemia    Obesity    Osteoarthritis    Prolactinoma ([place])    Sciatica    Past Surgical History:  Procedure Laterality Date   CHOLECYSTECTOMY     COLONOSCOPY     Allergies  Allergen Reactions   Bahia Swelling   [place] Grass Hives   Cedar Swelling   Cladosporium Cladosporioides Hives   Dust Mite Extract Hives   Elm Bark [Ulmus Fulva] Swelling   Lidocaine Hcl Swelling   Molds & Smuts Hives   Mugwort Hives   Nettle [place] Dioica] Hives   Sorrel-Dock Mix [Sheep Sorrel-Yellow Dock] Hives   Timothy Grass Pollen Allergen Hives   Xylocaine [Lidocaine] Swelling   Current Outpatient Medications on File Prior to Visit  Medication Sig Dispense Refill   albuterol (PROAIR HFA) 108 (90 Base) MCG/ACT inhaler Inhale 1 puff into the lungs every 4 (four) hours as needed for wheezing or shortness of breath. 6.7 g 0   Ascorbic Acid (VITAMIN C) 1000 MG tablet Take 1,000 mg by mouth daily.     atorvastatin (LIPITOR) 10 MG tablet Take 1 tablet (10 mg total) by mouth daily. 90 tablet 2   baclofen (LIORESAL) 10 MG tablet Take 0.5-1 tablets (5-10 mg total) by mouth 3 (three) times daily as needed for muscle spasms. 30 each 3   benzonatate (TESSALON) 200 MG capsule Take 1 capsule (200 mg total) by mouth 3 (three) times daily as needed for cough.  30 capsule 3   bromocriptine (PARLODEL) 2.5 MG tablet Take 1 tablet (2.5 mg total) by mouth daily. 90 tablet 3   Butenafine HCl (MENTAX) 1 % cream Apply 1 application topically 2 (two) times daily. 24 g 1   Cholecalciferol (VITAMIN D3 PO) Take 1 capsule by mouth daily.     Docusate Sodium (COLACE PO) Take 1 capsule by mouth daily.     EPINEPHrine 0.3 mg/0.3 mL IJ SOAJ injection Inject 0.3 mg into the muscle once.     hydrochlorothiazide (HYDRODIURIL) 12.5 MG tablet TAKE 1 TABLET(12.5 MG) BY MOUTH DAILY 90 tablet 0   levonorgestrel (MIRENA) 20 MCG/24HR IUD 1 each by Intrauterine route once.      nabumetone (RELAFEN) 500 MG tablet TAKE 1 TABLET(500 MG) BY MOUTH TWICE DAILY AS NEEDED 60 tablet 3   omega-3 fish oil (MAXEPA) 1000 MG CAPS capsule Take 2 capsules by mouth daily.      Pancrelipase, Lip-Prot-Amyl, (CREON) 24000-76000 units CPEP Take 2 capsules by mouth See admin instructions. Take 2 capsules three times daily with food and 1 capsule twice daily with snacks.     Psyllium (METAMUCIL PO) Take 1 capsule by mouth daily.     tiZANidine (ZANAFLEX) 4 MG tablet Take 1 tablet (4 mg total) by mouth every 8 (eight) hours as needed for muscle spasms. 90 tablet 1   Trospium Chloride 60 MG CP24 Take 1 capsule by mouth daily.  Zinc Sulfate (ZINC 15 PO) Take 1 tablet by mouth daily.     pantoprazole (PROTONIX) 20 MG tablet Take 20 mg by mouth daily.     pantoprazole (PROTONIX) 40 MG tablet Take 40 mg by mouth daily.     No current facility-administered medications on file prior to visit.   Social History   Socioeconomic History   Marital status: Divorced    Spouse name: Not on file   Number of children: Not on file   Years of education: Not on file   Highest education level: Not on file  Occupational History   Not on file  Tobacco Use   Smoking status: Never   Smokeless tobacco: Never  Vaping Use   Vaping Use: Never used  Substance and Sexual Activity   Alcohol use: Never   Drug use:  Never   Sexual activity: Yes  Other Topics Concern   Not on file  Social History Narrative   Not on file   Social Determinants of Health   Financial Resource Strain: Not on file  Food Insecurity: Not on file  Transportation Needs: Not on file  Physical Activity: Not on file  Stress: Not on file  Social Connections: Not on file  Intimate Partner Violence: Not on file   Family History  Problem Relation Age of Onset   Other Paternal Aunt        pituitary surgery   Hypertension Mother    Diabetes Mother    Colon cancer Mother    Arthritis Mother    Thyroid disease Mother    Asthma Mother    Diabetes Father    Hypertension Father    Bell's palsy Father      OBJECTIVE:  Vitals:   02/23/21 1549  BP: 101/69  Pulse: 67  Temp: (!) 97.5 F (36.4 C)  TempSrc: Temporal  SpO2: 96%  Weight: 273 lb 9.6 oz (124.1 kg)  Height: '5\' 9"'$  (1.753 m)    Physical Exam General: Vital signs reviewed.  Patient is well-developed and well-nourished, morbid obese in no acute distress and cooperative with exam.  Head: Normocephalic and atraumatic. Eyes: EOMI, conjunctivae normal, no scleral icterus.  Neck: Supple, trachea midline, normal ROM, no JVD, masses, thyromegaly, or carotid bruit present.  Cardiovascular: RRR, S1 normal, S2 normal, no murmurs, gallops, or rubs. Pulmonary/Chest: Clear to auscultation bilaterally, no wheezes, rales, or rhonchi. Abdominal: Soft, non-tender, non-distended, BS +, no masses, organomegaly, or guarding present.  Musculoskeletal: No joint deformities, erythema, or stiffness, ROM full and nontender. Extremities: No lower extremity edema bilaterally,  pulses symmetric and intact bilaterally. No cyanosis or clubbing. Neurological: A&O x3, Strength is normal and symmetric bilaterally, cranial nerve II-XII are grossly intact, no focal motor deficit, sensory intact to light touch bilaterally.  Skin: Warm, dry and intact. No rashes or erythema. Psychiatric: Normal  mood and affect. speech and behavior is normal. Cognition and memory are normal.    Review of Systems  Constitutional:  Positive for malaise/fatigue.  Respiratory:  Positive for shortness of breath.        With exertion  On CPAP  Gastrointestinal:  Positive for constipation.       Medication induce  Musculoskeletal:        Bilateral weakness unstable gait use Rolator and cane    Neurological:  Positive for weakness.       Lower extremities   Psychiatric/Behavioral:  The patient has insomnia.        On CPAP   All other systems  reviewed and are negative.  Last 3 Office BP readings: BP Readings from Last 3 Encounters:  02/23/21 101/69  12/22/20 118/74  06/16/20 114/84    BMET    Component Value Date/Time   NA 140 01/02/2020 0955   K 4.0 01/02/2020 0955   CL 102 01/02/2020 0955   CO2 25 01/02/2020 0955   GLUCOSE 115 (H) 01/02/2020 0955   GLUCOSE 224 (H) 12/19/2019 0349   BUN 9 01/02/2020 0955   CREATININE 0.84 01/02/2020 0955   CALCIUM 9.1 01/02/2020 0955   GFRNONAA 85 01/02/2020 0955   GFRAA 98 01/02/2020 0955    Renal function: CrCl cannot be calculated (Patient's most recent lab result is older than the maximum 21 days allowed.).  Clinical ASCVD: Yes  The 10-year ASCVD risk score Mikey Bussing DC Jr., et al., 2013) is: 1.5%   Values used to calculate the score:     Age: 54 years     Sex: Female     Is Non-Hispanic African American: Yes     Diabetic: No     Tobacco smoker: No     Systolic Blood Pressure: 99991111 mmHg     Is BP treated: Yes     HDL Cholesterol: 35 mg/dL     Total Cholesterol: 190 mg/dL  ASCVD risk factors include- [place]   ASSESSMENT & PLAN:   Beth [name] was seen today for blood pressure check.  Diagnoses and all orders for this visit:  Essential hypertension  -Counseled on lifestyle modifications for blood pressure control including reduced dietary sodium, increased exercise, weight reduction and adequate sleep. Also, educated patient about the risk  for cardiovascular events, stroke and heart attack. Also counseled patient about the importance of medication adherence. If you participate in smoking, it is important to stop using tobacco as this will increase the risks associated with uncontrolled blood pressure.   Goal BP:  For patients younger than 60: Goal BP < 130/80. For patients 60 and older: Goal BP < 140/90. For patients with diabetes: Goal BP < 130/80. Your most recent BP: 101/69  Minimize salt intake. Minimize alcohol intake  Physical deconditioning Gait instability uses a rollator but each visit she shows PCP what she can do new she is progressing slowly but very determine to have increase functional ability   Morbid obesity ([place]) Morbid Obesity is > 40 indicating an excess in caloric intake or underlining conditions. Due to respiratory flare ups she has been off and on steroids which can also cause weight gain. This may lead to other co-morbidities. Lifestyle modifications of diet and exercise may reduce obesity.   -     Amb ref to [facility]   This note has been created with Surveyor, quantity. Any transcriptional errors are unintentional.   [name], NP 02/23/2021, 4:03 PM

## 2021-02-23 NOTE — Patient Instructions (Signed)
Managing Your Hypertension Hypertension, also called high blood pressure, is when the force of the blood pressing against the walls of the arteries is too strong. Arteries are blood vessels that carry blood from your heart throughout your body. Hypertension forces the heart to work harder to pump blood and may cause the arteries tobecome narrow or stiff. Understanding blood pressure readings Your personal target blood pressure may vary depending on your medical conditions, your age, and other factors. A blood pressure reading includes a higher number over a lower number. Ideally, your blood pressure should be below 120/80. You should know that: The first, or top, number is called the systolic pressure. It is a measure of the pressure in your arteries as your heart beats. The second, or bottom number, is called the diastolic pressure. It is a measure of the pressure in your arteries as the heart relaxes. Blood pressure is classified into four stages. Based on your blood pressure reading, your health care provider may use the following stages to determine what type of treatment you need, if any. Systolic pressure and diastolicpressure are measured in a unit called mmHg. Normal Systolic pressure: below 120. Diastolic pressure: below 80. Elevated Systolic pressure: 120-129. Diastolic pressure: below 80. Hypertension stage 1 Systolic pressure: 130-139. Diastolic pressure: 80-89. Hypertension stage 2 Systolic pressure: 140 or above. Diastolic pressure: 90 or above. How can this condition affect me? Managing your hypertension is an important responsibility. Over time, hypertension can damage the arteries and decrease blood flow to important parts of the body, including the brain, heart, and kidneys. Having untreated or uncontrolled hypertension can lead to: A heart attack. A stroke. A weakened blood vessel (aneurysm). Heart failure. Kidney damage. Eye damage. Metabolic syndrome. Memory and  concentration problems. Vascular dementia. What actions can I take to manage this condition? Hypertension can be managed by making lifestyle changes and possibly by taking medicines. Your health care provider will help you make a plan to bring yourblood pressure within a normal range. Nutrition  Eat a diet that is high in fiber and potassium, and low in salt (sodium), added sugar, and fat. An example eating plan is called the Dietary Approaches to Stop Hypertension (DASH) diet. To eat this way: Eat plenty of fresh fruits and vegetables. Try to fill one-half of your plate at each meal with fruits and vegetables. Eat whole grains, such as whole-wheat pasta, brown rice, or whole-grain bread. Fill about one-fourth of your plate with whole grains. Eat low-fat dairy products. Avoid fatty cuts of meat, processed or cured meats, and poultry with skin. Fill about one-fourth of your plate with lean proteins such as fish, chicken without skin, beans, eggs, and tofu. Avoid pre-made and processed foods. These tend to be higher in sodium, added sugar, and fat. Reduce your daily sodium intake. Most people with hypertension should eat less than 1,500 mg of sodium a day.  Lifestyle  Work with your health care provider to maintain a healthy body weight or to lose weight. Ask what an ideal weight is for you. Get at least 30 minutes of exercise that causes your heart to beat faster (aerobic exercise) most days of the week. Activities may include walking, swimming, or biking. Include exercise to strengthen your muscles (resistance exercise), such as weight lifting, as part of your weekly exercise routine. Try to do these types of exercises for 30 minutes at least 3 days a week. Do not use any products that contain nicotine or tobacco, such as cigarettes, e-cigarettes, and chewing   tobacco. If you need help quitting, ask your health care provider. Control any long-term (chronic) conditions you have, such as high  cholesterol or diabetes. Identify your sources of stress and find ways to manage stress. This may include meditation, deep breathing, or making time for fun activities.  Alcohol use Do not drink alcohol if: Your health care provider tells you not to drink. You are pregnant, may be pregnant, or are planning to become pregnant. If you drink alcohol: Limit how much you use to: 0-1 drink a day for women. 0-2 drinks a day for men. Be aware of how much alcohol is in your drink. In the [place] one drink equals one 12 oz bottle of beer (355 mL), one 5 oz glass of wine (148 mL), or one 1 oz glass of hard liquor (44 mL). Medicines Your health care provider may prescribe medicine if lifestyle changes are not enough to get your blood pressure under control and if: Your systolic blood pressure is 130 or higher. Your diastolic blood pressure is 80 or higher. Take medicines only as told by your health care provider. Follow the directions carefully. Blood pressure medicines must be taken as told by your health care provider. The medicine does not work as well when you skip doses. Skippingdoses also puts you at risk for problems. Monitoring Before you monitor your blood pressure: Do not smoke, drink caffeinated beverages, or exercise within 30 minutes before taking a measurement. Use the bathroom and empty your bladder (urinate). Sit quietly for at least 5 minutes before taking measurements. Monitor your blood pressure at home as told by your health care provider. To do this: Sit with your back straight and supported. Place your feet flat on the floor. Do not cross your legs. Support your arm on a flat surface, such as a table. Make sure your upper arm is at heart level. Each time you measure, take two or three readings one minute apart and record the results. You may also need to have your blood pressure checked regularly by your healthcare provider. General information Talk with your health care  provider about your diet, exercise habits, and other lifestyle factors that may be contributing to hypertension. Review all the medicines you take with your health care provider because there may be side effects or interactions. Keep all visits as told by your health care provider. Your health care provider can help you create and adjust your plan for managing your high blood pressure. Where to find more information National Heart, Lung, and Blood Institute: www.nhlbi.nih.gov American Heart Association: www.heart.org Contact a health care provider if: You think you are having a reaction to medicines you have taken. You have repeated (recurrent) headaches. You feel dizzy. You have swelling in your ankles. You have trouble with your vision. Get help right away if: You develop a severe headache or confusion. You have unusual weakness or numbness, or you feel faint. You have severe pain in your chest or abdomen. You vomit repeatedly. You have trouble breathing. These symptoms may represent a serious problem that is an emergency. Do not wait to see if the symptoms will go away. Get medical help right away. Call your local emergency services (911 in the [place] Do not drive yourself to the hospital. Summary Hypertension is when the force of blood pumping through your arteries is too strong. If this condition is not controlled, it may put you at risk for serious complications. Your personal target blood pressure may vary depending on your medical conditions,   your age, and other factors. For most people, a normal blood pressure is less than 120/80. Hypertension is managed by lifestyle changes, medicines, or both. Lifestyle changes to help manage hypertension include losing weight, eating a healthy, low-sodium diet, exercising more, stopping smoking, and limiting alcohol. This information is not intended to replace advice given to you by your health care provider. Make sure you discuss any questions  you have with your healthcare provider. Document Revised: 07/18/2019 Document Reviewed: 05/13/2019 Elsevier Patient Education  2022 [employer]  

## 2021-03-02 ENCOUNTER — Other Ambulatory Visit (INDEPENDENT_AMBULATORY_CARE_PROVIDER_SITE_OTHER): Payer: Self-pay | Admitting: Primary Care

## 2021-03-02 NOTE — Telephone Encounter (Signed)
Sent to PCP. Patient needs lipids

## 2021-03-17 DIAGNOSIS — N3941 Urge incontinence: Secondary | ICD-10-CM | POA: Diagnosis not present

## 2021-03-17 DIAGNOSIS — R3915 Urgency of urination: Secondary | ICD-10-CM | POA: Diagnosis not present

## 2021-03-17 DIAGNOSIS — N3281 Overactive bladder: Secondary | ICD-10-CM | POA: Diagnosis not present

## 2021-04-01 ENCOUNTER — Ambulatory Visit: Payer: BC Managed Care – PPO | Admitting: Cardiology

## 2021-04-01 ENCOUNTER — Other Ambulatory Visit: Payer: Self-pay

## 2021-04-01 ENCOUNTER — Encounter: Payer: Self-pay | Admitting: Cardiology

## 2021-04-01 VITALS — BP 123/85 | HR 64 | Temp 98.5°F | Resp 17 | Ht 69.0 in | Wt 277.2 lb

## 2021-04-01 DIAGNOSIS — Z6841 Body Mass Index (BMI) 40.0 and over, adult: Secondary | ICD-10-CM

## 2021-04-01 DIAGNOSIS — E782 Mixed hyperlipidemia: Secondary | ICD-10-CM | POA: Diagnosis not present

## 2021-04-01 DIAGNOSIS — U071 COVID-19: Secondary | ICD-10-CM | POA: Diagnosis not present

## 2021-04-01 DIAGNOSIS — I479 Paroxysmal tachycardia, unspecified: Secondary | ICD-10-CM

## 2021-04-01 DIAGNOSIS — R0609 Other forms of dyspnea: Secondary | ICD-10-CM | POA: Diagnosis not present

## 2021-04-01 DIAGNOSIS — J1282 Pneumonia due to coronavirus disease 2019: Secondary | ICD-10-CM

## 2021-04-01 DIAGNOSIS — I10 Essential (primary) hypertension: Secondary | ICD-10-CM

## 2021-04-01 NOTE — Progress Notes (Signed)
ID:  Beth [name], DOB [birth year], MRN [id number]  PCP:  [name], NP  Cardiologist:  Rex Kras, DO, [facility] (established care 02/19/2020)  Date: 04/01/21 Last Office Visit: 04/01/2020   Chief Complaint  Patient presents with   REEVALUATION OF SYMPTOMS   Tachycardia, paroxysmal ([place])    HPI  Beth [name] is a 45 y.o. female who presents to the office with a chief complaint of " 1 year follow-up for tachycardia management." Patient's past medical history and cardiovascular risk factors include: History of COVID-19 infection, hyperlipidemia, hypertension, obesity due to excess calories.  Patient is referred to the office at the request of her primary care provider for evaluation of tachycardia with effort related activities.   In June 2021 patient was diagnosed with COVID-19 infection with prolonged sequelae of pneumonia and hypoxia.  Initially patient was wheelchair-bound and requiring nasal cannula oxygen and would have tachycardia with rates between 120-130 bpm with minimal movement.  She was referred to cardiology for further evaluation and management.  In the past patient was recommended to undergo PFTs, sleep study, TSH levels and extended Holter monitor.  She was noted to have sleep apnea which is now being corrected with CPAP machine.  Extended Holter monitor noted for underlying rhythm to be sinus without any significant dysrhythmias.  And patient was noted to have hyperthyroidism which is now being corrected by her primary care provider.  In the past echocardiogram noted preserved LVEF with normal diastolic filling pattern and stress test was overall a low risk study.  She now presents for 1 year follow-up.  Over the last 1 year patient states that her tachycardia is relatively stable.  More pronounced with effort related activities and she tries not to overexert herself.  Unfortunately due to this she has increased sedentary lifestyle which has resulted in 13  pounds of weight gain since last office visit.  Her TSH is not back to normal and her shortness of breath remains chronic and stable.  She has an appointment with her PCP in December 2022 and she plans to have new blood work to evaluate her TSH as well as her lipid profile.  No hospitalizations or urgent care visits for cardiovascular symptoms since last office encounter.  FUNCTIONAL STATUS: No structured exercise program or daily routine.   ALLERGIES: Allergies  Allergen Reactions   [place] Swelling   [place] Grass Hives   Cedar Swelling   Cladosporium Cladosporioides Hives   Dust Mite Extract Hives   Elm Bark [Ulmus Fulva] Swelling   Lidocaine Hcl Swelling   Molds & Smuts Hives   Mugwort Hives   Nettle [place] Dioica] Hives   Sorrel-Dock Mix [Sheep Sorrel-Yellow Dock] Hives   Timothy Grass Pollen Allergen Hives   Xylocaine [Lidocaine] Swelling    MEDICATION LIST PRIOR TO VISIT: Current Meds  Medication Sig   albuterol (PROAIR HFA) 108 (90 Base) MCG/ACT inhaler Inhale 1 puff into the lungs every 4 (four) hours as needed for wheezing or shortness of breath.   Ascorbic Acid (VITAMIN C) 1000 MG tablet Take 1,000 mg by mouth daily.   atorvastatin (LIPITOR) 10 MG tablet Take 1 tablet (10 mg total) by mouth daily.   baclofen (LIORESAL) 10 MG tablet Take 0.5-1 tablets (5-10 mg total) by mouth 3 (three) times daily as needed for muscle spasms.   benzonatate (TESSALON) 200 MG capsule Take 1 capsule (200 mg total) by mouth 3 (three) times daily as needed for cough.   bromocriptine (PARLODEL) 2.5 MG tablet Take 1  tablet (2.5 mg total) by mouth daily.   Butenafine HCl (MENTAX) 1 % cream Apply 1 application topically 2 (two) times daily.   Cholecalciferol (VITAMIN D3 PO) Take 1 capsule by mouth daily.   Docusate Sodium (COLACE PO) Take 1 capsule by mouth daily.   EPINEPHrine 0.3 mg/0.3 mL IJ SOAJ injection Inject 0.3 mg into the muscle once.   levonorgestrel (MIRENA) 20 MCG/24HR IUD 1 each  by Intrauterine route once.    nabumetone (RELAFEN) 500 MG tablet TAKE 1 TABLET(500 MG) BY MOUTH TWICE DAILY AS NEEDED   omega-3 fish oil (MAXEPA) 1000 MG CAPS capsule Take 2 capsules by mouth daily.    Pancrelipase, Lip-Prot-Amyl, (CREON) 24000-76000 units CPEP Take 2 capsules by mouth See admin instructions. Take 2 capsules three times daily with food and 1 capsule twice daily with snacks.   pantoprazole (PROTONIX) 20 MG tablet Take 20 mg by mouth daily.   Psyllium (METAMUCIL PO) Take 1 capsule by mouth daily.   Trospium Chloride 60 MG CP24 Take 1 capsule by mouth daily.   Zinc Sulfate (ZINC 15 PO) Take 1 tablet by mouth daily.     PAST MEDICAL HISTORY: Past Medical History:  Diagnosis Date   Asthma    Chronic pancreatitis (HCC)    DDD (degenerative disc disease), lumbosacral    DJD (degenerative joint disease)    Hyperlipidemia    Hypertension    Hypoglycemia    Obesity    Osteoarthritis    Prolactinoma ([place])    Sciatica     PAST SURGICAL HISTORY: Past Surgical History:  Procedure Laterality Date   CHOLECYSTECTOMY     COLONOSCOPY      FAMILY HISTORY: The patient family history includes Arthritis in her mother; Asthma in her mother; Bell's palsy in her father; Colon cancer in her mother; Diabetes in her father and mother; Hypertension in her father and mother; Other in her paternal aunt; Thyroid disease in her mother.  SOCIAL HISTORY:  The patient  reports that she has never smoked. She has never used smokeless tobacco. She reports that she does not drink alcohol and does not use drugs.  REVIEW OF SYSTEMS: Review of Systems  Constitutional: Negative for chills and fever.  HENT:  Negative for hoarse voice and nosebleeds.   Eyes:  Negative for discharge, double vision and pain.  Cardiovascular:  Negative for chest pain, claudication, dyspnea on exertion, leg swelling, near-syncope, orthopnea, palpitations, paroxysmal nocturnal dyspnea and syncope.  Respiratory:  Positive  for shortness of breath (improving). Negative for hemoptysis.   Musculoskeletal:  Negative for muscle cramps and myalgias.  Gastrointestinal:  Negative for abdominal pain, constipation, diarrhea, hematemesis, hematochezia, melena, nausea and vomiting.  Neurological:  Negative for dizziness and light-headedness.   PHYSICAL EXAM: Vitals with BMI 04/01/2021 02/23/2021 12/22/2020  Height 5\' 9"  5\' 9"  5\' 9"   Weight 277 lbs 3 oz 273 lbs 10 oz 277 lbs 13 oz  BMI 40.92 26.37 85.88  Systolic 502 774 128  Diastolic 85 69 74  Pulse 64 67 83    CONSTITUTIONAL: Appears older than stated age, ambulates with cane or 4 wheel walker, hemodynamically stable, no acute distress.   SKIN: Skin is warm and dry. No rash noted. No cyanosis. No pallor. No jaundice HEAD: Normocephalic and atraumatic.  EYES: No scleral icterus MOUTH/THROAT: Moist oral membranes.  NECK: No JVD present. No thyromegaly noted. No carotid bruits  LYMPHATIC: No visible cervical adenopathy.  CHEST Normal respiratory effort. No intercostal retractions  LUNGS: Clear to auscultation bilaterally.  no stridor. No wheezes. No rales.  CARDIOVASCULAR: Regular rate and rhythm, positive S1-S2, no murmurs rubs or gallops appreciated. ABDOMINAL: Obese, soft, nontender, nondistended, positive bowel sounds all 4 quadrants. No apparent ascites.  EXTREMITIES: No peripheral edema  HEMATOLOGIC: No significant bruising NEUROLOGIC: Oriented to person, place, and time. Nonfocal. Normal muscle tone.  PSYCHIATRIC: Normal mood and affect. Normal behavior. Cooperative  CARDIAC DATABASE: EKG: 04/01/2021: Normal sinus rhythm, 64 bpm, without underlying ischemia or injury pattern.  Echocardiogram: 11/29/2009 performed at [facility] heart center: Patient was provided the records. LVEF greater than 55%.  Trace MR, trace TR, trileaflet aortic valve, trace PR, no pericardial effusion.  03/12/2020:  Left ventricle cavity is normal in size and wall thickness. Normal  global wall motion. Normal LV systolic function with EF 68%. Normal diastolic filling pattern.  Mild (Grade I) mitral regurgitation.  Mild tricuspid regurgitation.  No evidence of pulmonary hypertension.   Stress Testing: Lexiscan Sestamibi stress test 03/03/2020:  Lexiscan nuclear stress test performed using 1-day protocol.  Normal myocardial perfusion. Stress LVEF 78%.  Low risk study.   Heart Catheterization: None  7 day extended Holter monitor:  Dominant rhythm normal sinus.  Heart rate 52-139 bpm.  Average heart rate 83 bpm.  No atrial fibrillation, supraventricular tachycardia, ventricular tachycardia, high grade AV block, sinus pause greater than or equal to 3 seconds in duration.  Total ventricular ectopic burden <1%.  Total supraventricular ectopic burden <1%.  Patient triggered events: 1.  Underlying rhythm sinus tachycardia without dysrhythmias.   LABORATORY DATA: CBC Latest Ref Rng & Units 01/02/2020 12/19/2019 12/18/2019  WBC 3.4 - 10.8 x10E3/uL 7.4 14.4(H) 16.1(H)  Hemoglobin 11.1 - 15.9 g/dL 12.2 10.7(L) 10.5(L)  Hematocrit 34.0 - 46.6 % 37.4 31.9(L) 31.1(L)  Platelets 150 - 450 x10E3/uL 228 480(H) 445(H)    CMP Latest Ref Rng & Units 01/02/2020 12/19/2019 12/18/2019  Glucose 65 - 99 mg/dL 115(H) 224(H) 169(H)  BUN 6 - 24 mg/dL 9 10 10   Creatinine 0.57 - 1.00 mg/dL 0.84 0.82 0.81  Sodium 134 - 144 mmol/L 140 138 139  Potassium 3.5 - 5.2 mmol/L 4.0 4.2 3.8  Chloride 96 - 106 mmol/L 102 102 104  CO2 20 - 29 mmol/L 25 26 25   Calcium 8.7 - 10.2 mg/dL 9.1 8.4(L) 8.4(L)  Total Protein 6.0 - 8.5 g/dL 6.0 5.7(L) 5.9(L)  Total Bilirubin 0.0 - 1.2 mg/dL 0.2 0.5 0.5  Alkaline Phos 48 - 121 IU/L 94 60 64  AST 0 - 40 IU/L 15 20 19   ALT 0 - 32 IU/L 30 27 26     Lipid Panel     Component Value Date/Time   CHOL 190 02/24/2020 1405   TRIG 158 (H) 02/24/2020 1405   HDL 35 (L) 02/24/2020 1405   CHOLHDL 5.4 (H) 02/24/2020 1405   LDLCALC 127 (H) 02/24/2020 1405   LABVLDL 28  02/24/2020 1405    No components found for: NTPROBNP No results for input(s): PROBNP in the last 8760 hours. Recent Labs    06/11/20 0854  TSH 0.84    BMP No results for input(s): NA, K, CL, CO2, GLUCOSE, BUN, CREATININE, CALCIUM, GFRNONAA, GFRAA in the last 8760 hours.   HEMOGLOBIN A1C No results found for: HGBA1C, MPG  IMPRESSION:    ICD-10-CM   1. Tachycardia, paroxysmal (HCC)  I47.9 EKG 12-Lead    2. History of pneumonia due to COVID-19 virus  U07.1    J12.82     3. Dyspnea on exertion  R06.09  4. Mixed hyperlipidemia  E78.2     5. Benign hypertension  I10     6. Class 3 severe obesity due to excess calories with serious comorbidity and body mass index (BMI) of 40.0 to 44.9 in adult [facility])  E66.01    Z68.41        RECOMMENDATIONS: Beth [name] is a 45 y.o. female whose past medical history and cardiac risk factors include: History of COVID-19 infection, hyperlipidemia, hypertension, obesity due to excess calories.  Tachycardia, paroxysmal (HCC) Chronic and stable. Patient has undergone an ischemic evaluation as outlined above. Patient is encouraged to increase her physical activity gradually as she tolerates it to help improve her overall physical endurance and to help facilitate weight loss to reduce the chance of adding comorbid conditions later on. Patient understands that the tachycardia component may be physiological and as long as that she does not experience of lightheadedness, dizziness, near-syncope or syncopal event she should not be alarmed.  If such symptoms surface she is more than welcome to call us back for reevaluation or if the office is close to go to the ER for further evaluation and management.  Dyspnea on exertion Improving. Chronic and stable. Most likely secondary to her prolonged COVID-19 infection and sequelae of hypoxia and pneumonia, deconditioning, obesity, etc. Encouraged her to increase her physical activity as tolerated as  discussed above.  Mixed hyperlipidemia Currently on atorvastatin.   She denies myalgia or other side effects. Currently managed by primary care provider.  Benign hypertension Office blood pressures are very well controlled. Occasions reconciled. Educated on the importance of low-salt diet. Continue to monitor.  Class 3 severe obesity due to excess calories with serious comorbidity and body mass index (BMI) of 40.0 to 44.9 in adult [facility]) Patient has gained approximately 13 pounds over the last 1 year.  Most likely secondary to inactivity with concerns of tachycardia. Body mass index is 40.94 kg/m. I reviewed with the patient the importance of diet, regular physical activity/exercise, weight loss.   Patient is educated on increasing physical activity gradually as tolerated.  With the goal of moderate intensity exercise for 30 minutes a day 5 days a week.  I would recommend that she follow-up in 1 year for reevaluation of symptoms or on as needed basis.  FINAL MEDICATION LIST END OF ENCOUNTER: No orders of the defined types were placed in this encounter.    Current Outpatient Medications:    albuterol (PROAIR HFA) 108 (90 Base) MCG/ACT inhaler, Inhale 1 puff into the lungs every 4 (four) hours as needed for wheezing or shortness of breath., Disp: 6.7 g, Rfl: 0   Ascorbic Acid (VITAMIN C) 1000 MG tablet, Take 1,000 mg by mouth daily., Disp: , Rfl:    atorvastatin (LIPITOR) 10 MG tablet, Take 1 tablet (10 mg total) by mouth daily., Disp: 90 tablet, Rfl: 2   baclofen (LIORESAL) 10 MG tablet, Take 0.5-1 tablets (5-10 mg total) by mouth 3 (three) times daily as needed for muscle spasms., Disp: 30 each, Rfl: 3   benzonatate (TESSALON) 200 MG capsule, Take 1 capsule (200 mg total) by mouth 3 (three) times daily as needed for cough., Disp: 30 capsule, Rfl: 3   bromocriptine (PARLODEL) 2.5 MG tablet, Take 1 tablet (2.5 mg total) by mouth daily., Disp: 90 tablet, Rfl: 3   Butenafine HCl (MENTAX)  1 % cream, Apply 1 application topically 2 (two) times daily., Disp: 24 g, Rfl: 1   Cholecalciferol (VITAMIN D3 PO), Take 1 capsule by mouth  daily., Disp: , Rfl:    Docusate Sodium (COLACE PO), Take 1 capsule by mouth daily., Disp: , Rfl:    EPINEPHrine 0.3 mg/0.3 mL IJ SOAJ injection, Inject 0.3 mg into the muscle once., Disp: , Rfl:    levonorgestrel (MIRENA) 20 MCG/24HR IUD, 1 each by Intrauterine route once. , Disp: , Rfl:    nabumetone (RELAFEN) 500 MG tablet, TAKE 1 TABLET(500 MG) BY MOUTH TWICE DAILY AS NEEDED, Disp: 60 tablet, Rfl: 3   omega-3 fish oil (MAXEPA) 1000 MG CAPS capsule, Take 2 capsules by mouth daily. , Disp: , Rfl:    Pancrelipase, Lip-Prot-Amyl, (CREON) 24000-76000 units CPEP, Take 2 capsules by mouth See admin instructions. Take 2 capsules three times daily with food and 1 capsule twice daily with snacks., Disp: , Rfl:    pantoprazole (PROTONIX) 20 MG tablet, Take 20 mg by mouth daily., Disp: , Rfl:    Psyllium (METAMUCIL PO), Take 1 capsule by mouth daily., Disp: , Rfl:    Trospium Chloride 60 MG CP24, Take 1 capsule by mouth daily., Disp: , Rfl:    Zinc Sulfate (ZINC 15 PO), Take 1 tablet by mouth daily., Disp: , Rfl:   Orders Placed This Encounter  Procedures   EKG 12-Lead   There are no Patient Instructions on file for this visit.   --Continue cardiac medications as reconciled in final medication list. --Return in about 62 weeks (around 06/09/2022) for Follow up. Or sooner if needed. --Continue follow-up with your primary care physician regarding the management of your other chronic comorbid conditions.  Patient's questions and concerns were addressed to her satisfaction. She voices understanding of the instructions provided during this encounter.   This note was created using a voice recognition software as a result there may be grammatical errors inadvertently enclosed that do not reflect the nature of this encounter. Every attempt is made to correct such  errors.  Total time spent: 22 minutes.  Rex Kras, [place], [facility]  Pager: [phone] Office: [phone]

## 2021-05-03 DIAGNOSIS — G4733 Obstructive sleep apnea (adult) (pediatric): Secondary | ICD-10-CM | POA: Diagnosis not present

## 2021-05-17 ENCOUNTER — Encounter: Payer: BC Managed Care – PPO | Attending: Primary Care | Admitting: Dietician

## 2021-06-02 DIAGNOSIS — G4733 Obstructive sleep apnea (adult) (pediatric): Secondary | ICD-10-CM | POA: Diagnosis not present

## 2021-06-13 ENCOUNTER — Other Ambulatory Visit: Payer: Self-pay

## 2021-06-13 ENCOUNTER — Ambulatory Visit (INDEPENDENT_AMBULATORY_CARE_PROVIDER_SITE_OTHER): Payer: BC Managed Care – PPO | Admitting: Primary Care

## 2021-06-13 ENCOUNTER — Encounter (INDEPENDENT_AMBULATORY_CARE_PROVIDER_SITE_OTHER): Payer: Self-pay | Admitting: Primary Care

## 2021-06-13 VITALS — BP 114/80 | HR 74 | Temp 97.5°F | Ht 69.0 in | Wt 276.0 lb

## 2021-06-13 DIAGNOSIS — Z0001 Encounter for general adult medical examination with abnormal findings: Secondary | ICD-10-CM | POA: Diagnosis not present

## 2021-06-13 DIAGNOSIS — R7989 Other specified abnormal findings of blood chemistry: Secondary | ICD-10-CM

## 2021-06-13 DIAGNOSIS — M159 Polyosteoarthritis, unspecified: Secondary | ICD-10-CM | POA: Diagnosis not present

## 2021-06-13 DIAGNOSIS — I10 Essential (primary) hypertension: Secondary | ICD-10-CM | POA: Diagnosis not present

## 2021-06-13 DIAGNOSIS — E876 Hypokalemia: Secondary | ICD-10-CM | POA: Diagnosis not present

## 2021-06-13 DIAGNOSIS — Z6841 Body Mass Index (BMI) 40.0 and over, adult: Secondary | ICD-10-CM

## 2021-06-13 DIAGNOSIS — Z0000 Encounter for general adult medical examination without abnormal findings: Secondary | ICD-10-CM

## 2021-06-13 NOTE — Progress Notes (Signed)
Beth [name] is a 45 y.o. female presents to office today for annual physical exam examination.    Concerns today include: 1. No complaints or concern   Occupation: Administrator, Marital status: D, Substance use: No Diet: yes , Exercise: yes Last eye exam: 4/22 Last dental exam: 11/22 Last colonoscopy: 2020- [facility] Last mammogram: 3/21 Last pap smear: 3/22 - [facility]  Refills needed today: yes Immunizations needed: Flu Vaccine: no  Tdap Vaccine: no  - every 43yr - (<3 lifetime doses or unknown): all wounds -- look up need for Tetanus IG - (>=3 lifetime doses): clean/minor wound if >131yrfrom previous; all other wounds if >5y45yrrom previous Zoster Vaccine: no (those >45yo, once) Pneumonia Vaccine: no (those w/ risk factors) - (<65y46yrth: Immunocompromised, cochlear implant, CSF leak, asplenic, sickle cell, Chronic Renal Failure - (<39yr44yrV-23 only: Heart dz, lung disease, DM, tobacco abuse, alcoholism, cirrhosis/liver disease. - (>39yr)93yrV13 then PPSV23 in 6-12mths;  - (>39yr):28yrat PPSV23 once if pt received prior to 45yo an60yors ha108yrassed  Past Medical History:  Diagnosis Date   Asthma    Chronic pancreatitis (HCC)    [place] (degenerative disc disease), lumbosacral    DJD (degenerative joint disease)    Hyperlipidemia    Hypertension    Hypoglycemia    Obesity    Osteoarthritis    Prolactinoma (HCC)    Veblenatica    Social History   Socioeconomic History   Marital status: Divorced    Spouse name: Not on file   Number of children: Not on file   Years of education: Not on file   Highest education level: Not on file  Occupational History   Not on file  Tobacco Use   Smoking status: Never   Smokeless tobacco: Never  Vaping Use   Vaping Use: Never used  Substance and Sexual Activity   Alcohol use: Never   Drug use: Never   Sexual activity: Yes  Other Topics Concern   Not on file  Social History Narrative   Not on file   Social  Determinants of Health   Financial Resource Strain: Not on file  Food Insecurity: Not on file  Transportation Needs: Not on file  Physical Activity: Not on file  Stress: Not on file  Social Connections: Not on file  Intimate Partner Violence: Not on file   Past Surgical History:  Procedure Laterality Date   CHOLECYSTECTOMY     COLONOSCOPY     Family History  Problem Relation Age of Onset   Other Paternal Aunt        pituitary surgery   Hypertension Mother    Diabetes Mother    Colon cancer Mother    Arthritis Mother    Thyroid disease Mother    Asthma Mother    Diabetes Father    Hypertension Father    Bell's palsy Father     Current Outpatient Medications:    albuterol (PROAIR HFA) 108 (90 Base) MCG/ACT inhaler, Inhale 1 puff into the lungs every 4 (four) hours as needed for wheezing or shortness of breath., Disp: 6.7 g, Rfl: 0   Ascorbic Acid (VITAMIN C) 1000 MG tablet, Take 1,000 mg by mouth daily., Disp: , Rfl:    atorvastatin (LIPITOR) 10 MG tablet, Take 1 tablet (10 mg total) by mouth daily., Disp: 90 tablet, Rfl: 2   baclofen (LIORESAL) 10 MG tablet, Take 0.5-1 tablets (5-10 mg total) by mouth 3 (three) times daily as needed for muscle spasms.,  Disp: 30 each, Rfl: 3   benzonatate (TESSALON) 200 MG capsule, Take 1 capsule (200 mg total) by mouth 3 (three) times daily as needed for cough., Disp: 30 capsule, Rfl: 3   bromocriptine (PARLODEL) 2.5 MG tablet, Take 1 tablet (2.5 mg total) by mouth daily., Disp: 90 tablet, Rfl: 3   Butenafine HCl (MENTAX) 1 % cream, Apply 1 application topically 2 (two) times daily., Disp: 24 g, Rfl: 1   Cholecalciferol (VITAMIN D3 PO), Take 1 capsule by mouth daily., Disp: , Rfl:    Docusate Sodium (COLACE PO), Take 1 capsule by mouth daily., Disp: , Rfl:    EPINEPHrine 0.3 mg/0.3 mL IJ SOAJ injection, Inject 0.3 mg into the muscle once., Disp: , Rfl:    levonorgestrel (MIRENA) 20 MCG/24HR IUD, 1 each by Intrauterine route once. , Disp: ,  Rfl:    nabumetone (RELAFEN) 500 MG tablet, TAKE 1 TABLET(500 MG) BY MOUTH TWICE DAILY AS NEEDED, Disp: 60 tablet, Rfl: 3   omega-3 fish oil (MAXEPA) 1000 MG CAPS capsule, Take 2 capsules by mouth daily. , Disp: , Rfl:    Pancrelipase, Lip-Prot-Amyl, (CREON) 24000-76000 units CPEP, Take 2 capsules by mouth See admin instructions. Take 2 capsules three times daily with food and 1 capsule twice daily with snacks., Disp: , Rfl:    pantoprazole (PROTONIX) 20 MG tablet, Take 20 mg by mouth daily., Disp: , Rfl:    Psyllium (METAMUCIL PO), Take 1 capsule by mouth daily., Disp: , Rfl:    Trospium Chloride 60 MG CP24, Take 1 capsule by mouth daily., Disp: , Rfl:    Zinc Sulfate (ZINC 15 PO), Take 1 tablet by mouth daily., Disp: , Rfl:   Allergies  Allergen Reactions   [place] Swelling   [place] Grass Hives   Cedar Swelling   Cladosporium Cladosporioides Hives   Dust Mite Extract Hives   Elm Bark [Ulmus Fulva] Swelling   Lidocaine Hcl Swelling   Molds & Smuts Hives   Mugwort Hives   Nettle [place] Dioica] Hives   Sorrel-Dock Mix [Sheep Sorrel-Yellow Dock] Hives   Timothy Grass Pollen Allergen Hives   Xylocaine [Lidocaine] Swelling     ROS: Review of Systems Pertinent items noted in HPI and remainder of comprehensive ROS otherwise negative.    Physical exam BP 114/80 (BP Location: Right Arm, Patient Position: Sitting, Cuff Size: Large)    Pulse 74    Temp (!) 97.5 F (36.4 C) (Temporal)    Ht '5\' 9"'  (1.753 m)    Wt 276 lb (125.2 kg)    LMP  (LMP Unknown)    SpO2 96%    BMI 40.76 kg/m  General appearance: alert, cooperative, and appears stated age Head: Normocephalic, without obvious abnormality, atraumatic Ears: normal TM's and external ear canals both ears Nose: Nares normal. Septum midline. Mucosa normal. No drainage or sinus tenderness. Neck: no adenopathy, no carotid bruit, no JVD, supple, symmetrical, trachea midline, and thyroid not enlarged, symmetric, no  tenderness/mass/nodules Lungs: clear to auscultation bilaterally Heart: regular rate and rhythm, S1, S2 normal, no murmur, click, rub or gallop Abdomen: soft, non-tender; bowel sounds normal; no masses,  no organomegaly Extremities: extremities normal, atraumatic, no cyanosis or edema Pulses: 2+ and symmetric Skin: Skin color, texture, turgor normal. No rashes or lesions Lymph nodes: Cervical, supraclavicular, and axillary nodes normal. Neurologic: Alert and oriented X 3, normal strength and tone. Normal symmetric reflexes. Normal coordination and gait    Assessment/ Plan: Beth [name] here for annual physical exam.  Beth [name] was seen today for annual exam.  Diagnoses and all orders for this visit:  Annual physical exam  Osteoarthritis of multiple joints, unspecified osteoarthritis type -     CBC with Differential  Hypokalemia -     CMP14+EGFR  Essential hypertension Counseled on blood pressure goal of less than 130/80, low-sodium, DASH diet, medication compliance, 150 minutes of moderate intensity exercise per week. Discussed medication compliance, adverse effects. (Followed by cardiology) -     CMP14+EGFR -     CBC with Differential  Low TSH level Forward labs to endocrinology  -     TSH + free T4  Morbid obesity ([place]) Morbid Obesity is > 40  indicating an excess in caloric intake or underlining conditions. This may lead to other co-morbidities. Lifestyle modifications of diet and exercise may reduce obesity.  Scheduled for nutritionist next year.  -     Lipid Panel -     CMP14+EGFR -     CBC with Differential -     TSH + free T4     Counseled on healthy lifestyle choices, including diet (rich in fruits, vegetables and lean meats and low in salt and simple carbohydrates) and exercise (at least 30 minutes of moderate physical activity daily).  Patient to follow up in 1 year for annual exam or sooner if needed.  The above assessment and management plan was discussed  with the patient. The patient verbalized understanding of and has agreed to the management plan. Patient is aware to call the clinic if symptoms persist or worsen. Patient is aware when to return to the clinic for a follow-up visit. Patient educated on when it is appropriate to go to the emergency department.   [name] NP-C [address] [place] [phone]

## 2021-06-14 LAB — CMP14+EGFR
ALT: 14 IU/L (ref 0–32)
AST: 18 IU/L (ref 0–40)
Albumin/Globulin Ratio: 1.6 (ref 1.2–2.2)
Albumin: 4.1 g/dL (ref 3.8–4.8)
Alkaline Phosphatase: 104 IU/L (ref 44–121)
BUN/Creatinine Ratio: 21 (ref 9–23)
BUN: 18 mg/dL (ref 6–24)
Bilirubin Total: <0.2 mg/dL (ref 0.0–1.2)
CO2: 22 mmol/L (ref 20–29)
Calcium: 9.2 mg/dL (ref 8.7–10.2)
Chloride: 107 mmol/L — ABNORMAL HIGH (ref 96–106)
Creatinine, Ser: 0.85 mg/dL (ref 0.57–1.00)
Globulin, Total: 2.5 g/dL (ref 1.5–4.5)
Glucose: 97 mg/dL (ref 70–99)
Potassium: 4.1 mmol/L (ref 3.5–5.2)
Sodium: 144 mmol/L (ref 134–144)
Total Protein: 6.6 g/dL (ref 6.0–8.5)
eGFR: 86 mL/min/{1.73_m2} (ref >59)

## 2021-06-14 LAB — LIPID PANEL
Chol/HDL Ratio: 6.4 ratio — ABNORMAL HIGH (ref 0.0–4.4)
Cholesterol, Total: 229 mg/dL — ABNORMAL HIGH (ref 100–199)
HDL: 36 mg/dL — ABNORMAL LOW (ref >39)
LDL Chol Calc (NIH): 169 mg/dL — ABNORMAL HIGH (ref 0–99)
Triglycerides: 133 mg/dL (ref 0–149)
VLDL Cholesterol Cal: 24 mg/dL (ref 5–40)

## 2021-06-14 LAB — CBC WITH DIFFERENTIAL/PLATELET
Basophils Absolute: 0 10*3/uL (ref 0.0–0.2)
Basos: 1 %
EOS (ABSOLUTE): 0.2 10*3/uL (ref 0.0–0.4)
Eos: 4 %
Hematocrit: 36.2 % (ref 34.0–46.6)
Hemoglobin: 12.5 g/dL (ref 11.1–15.9)
Immature Grans (Abs): 0 10*3/uL (ref 0.0–0.1)
Immature Granulocytes: 0 %
Lymphocytes Absolute: 2.9 10*3/uL (ref 0.7–3.1)
Lymphs: 46 %
MCH: 27.8 pg (ref 26.6–33.0)
MCHC: 34.5 g/dL (ref 31.5–35.7)
MCV: 80 fL (ref 79–97)
Monocytes Absolute: 0.6 10*3/uL (ref 0.1–0.9)
Monocytes: 9 %
Neutrophils Absolute: 2.5 10*3/uL (ref 1.4–7.0)
Neutrophils: 40 %
Platelets: 250 10*3/uL (ref 150–450)
RBC: 4.5 x10E6/uL (ref 3.77–5.28)
RDW: 14.4 % (ref 11.7–15.4)
WBC: 6.2 10*3/uL (ref 3.4–10.8)

## 2021-06-14 LAB — TSH+FREE T4
Free T4: 0.99 ng/dL (ref 0.82–1.77)
TSH: 1.03 u[IU]/mL (ref 0.450–4.500)

## 2021-06-15 ENCOUNTER — Other Ambulatory Visit (INDEPENDENT_AMBULATORY_CARE_PROVIDER_SITE_OTHER): Payer: Self-pay | Admitting: Primary Care

## 2021-06-15 MED ORDER — ATORVASTATIN CALCIUM 40 MG PO TABS: 40.0000 mg | ORAL_TABLET | Freq: Every day | ORAL | 1 refills | Status: DC

## 2021-06-17 ENCOUNTER — Ambulatory Visit: Payer: BC Managed Care – PPO | Admitting: Endocrinology

## 2021-06-22 ENCOUNTER — Ambulatory Visit: Payer: BC Managed Care – PPO | Admitting: Emergency Medicine

## 2021-06-22 ENCOUNTER — Encounter: Payer: Self-pay | Admitting: Emergency Medicine

## 2021-06-22 ENCOUNTER — Other Ambulatory Visit: Payer: Self-pay

## 2021-06-22 DIAGNOSIS — J45909 Unspecified asthma, uncomplicated: Secondary | ICD-10-CM

## 2021-06-22 DIAGNOSIS — G4733 Obstructive sleep apnea (adult) (pediatric): Secondary | ICD-10-CM | POA: Diagnosis not present

## 2021-06-22 MED ORDER — FLUTICASONE PROPIONATE 50 MCG/ACT NA SUSP: 2.0000 | Freq: Every day | NASAL | 3 refills | Status: AC

## 2021-06-22 NOTE — Progress Notes (Signed)
° °  Subjective:    Patient ID: Beth [name], female    DOB: [birth year], 45 y.o.   MRN: [id number]  HPI  ROV 12/22/20 --this is a follow-up visit for 45 year old woman with a history of asthma and obstructive sleep apnea.  Also with some residual mild interstitial infiltrates on chest x-ray following COVID-19 infection. She reports today that she is a bit better, having less SOB but still struggles with stairs. She has completed PT. She is using albuterol a few times a week.  She reports that she was treated with abx, steroids, used her tessalon, albuterol for nasal congestion, cough, chest rattling in May. Still w some cough and clear mucous, some nasal congestion. She is using her CPAP every night. She believes that she is benefiting - has less daytime sleepiness. She sleeps through the night on most nights.    ROV 06/22/21 --45 year old woman who follows up today for asthma and OSA on CPAP.  Spirometry 03/22/2020 mixed mild obstruction and mild restriction. Has remained limited since she had COVID, believes that her SOB is better.  Not on scheduled inhaled medication, has albuterol and uses for congestion, certain exposures with wheeze, some exertion. Uses a few times a week.  Protonix 20 mg daily. CPAP compliance report available from 11/28 through 06/21/2021, shows under percent compliance with 97% of those nights greater than 4 hours.  AutoSet mode 5-20 cm water.  Median pressure 8.5, 95 percentile 11.5 with minimal leak and good control of events.  She reports that she is having some increased nasal congestion, seasonal. More noticeable on the CPAP. Confirms that she is benefiting from CPAP, more rested in the am, better energy. Less napping   Review of Systems As per HPI     Objective:   Physical Exam Vitals:   06/22/21 0851  BP: 116/74  Pulse: 78  Temp: 98.4 F (36.9 C)  TempSrc: Oral  SpO2: 97%  Weight: 276 lb 6.4 oz (125.4 kg)  Height: 5\' 9"  (1.753 m)   Gen: Pleasant, obese  woman, in no distress,  normal affect  ENT: No lesions,  mouth clear,  oropharynx clear, no postnasal drip  Neck: No JVD, no stridor  Lungs: No use of accessory muscles, no wheeze or crackles.   Cardiovascular: RRR, heart sounds normal, no murmur or gallops, no peripheral edema  Musculoskeletal: No deformities, no cyanosis or clubbing  Neuro: alert, awake, non focal  Skin: Warm, no lesions or rash      Assessment & Plan:  Asthma without acute exacerbation Overall doing well.  We will try to control her seasonal allergies a little bit better to prevent asthma flaring, allow her to wear her CPAP more comfortably.  Keep your albuterol available to use 2 puffs when you needed for shortness of breath, chest tightness, wheezing. Try starting fluticasone nasal spray, 2 sprays each nostril once daily.  If you are going to use this in the evening take it about 1 hour before bedtime. You also try using over-the-counter allergy medication such as loratadine 10 mg once daily  Follow with Dr. Lamonte Sakai in 12 months or sooner if you have any problems.   Obstructive sleep apnea Continue to wear your CPAP every night as you have been doing.   Baltazar Apo, MD, PhD 06/22/2021, 9:16 AM [facility] [phone] or if no answer [phone]

## 2021-06-22 NOTE — Assessment & Plan Note (Addendum)
Overall doing well.  We will try to control her seasonal allergies a little bit better to prevent asthma flaring, allow her to wear her CPAP more comfortably.  Keep your albuterol available to use 2 puffs when you needed for shortness of breath, chest tightness, wheezing. Try starting fluticasone nasal spray, 2 sprays each nostril once daily.  If you are going to use this in the evening take it about 1 hour before bedtime. You also try using over-the-counter allergy medication such as loratadine 10 mg once daily  Follow with Dr. Lamonte Sakai in 12 months or sooner if you have any problems.

## 2021-06-22 NOTE — Patient Instructions (Signed)
Keep your albuterol available to use 2 puffs when you needed for shortness of breath, chest tightness, wheezing. Try starting fluticasone nasal spray, 2 sprays each nostril once daily.  If you are going to use this in the evening take it about 1 hour before bedtime. You also try using over-the-counter allergy medication such as loratadine 10 mg once daily  Continue to wear your CPAP every night as you have been doing. Follow with Dr. Lamonte Sakai in 12 months or sooner if you have any problems.

## 2021-06-22 NOTE — Assessment & Plan Note (Signed)
Continue to wear your CPAP every night as you have been doing.

## 2021-06-28 ENCOUNTER — Ambulatory Visit: Payer: BC Managed Care – PPO | Admitting: Endocrinology

## 2021-06-30 ENCOUNTER — Other Ambulatory Visit: Payer: Self-pay | Admitting: Endocrinology

## 2021-07-03 DIAGNOSIS — G4733 Obstructive sleep apnea (adult) (pediatric): Secondary | ICD-10-CM | POA: Diagnosis not present

## 2021-07-13 ENCOUNTER — Encounter: Payer: Self-pay | Admitting: Dietician

## 2021-07-13 ENCOUNTER — Encounter: Payer: BC Managed Care – PPO | Attending: Primary Care | Admitting: Dietician

## 2021-07-13 ENCOUNTER — Other Ambulatory Visit: Payer: Self-pay

## 2021-07-13 VITALS — Ht 69.0 in | Wt 276.6 lb

## 2021-07-13 DIAGNOSIS — E669 Obesity, unspecified: Secondary | ICD-10-CM | POA: Diagnosis not present

## 2021-07-13 NOTE — Patient Instructions (Signed)
Take your Vitamin D with your last meal in the evening.  Give yourself 5 minutes after waking up before doing anything to assess how you feel. Pay attention to how your body feels, starting at your toes and moving all the way up your body to the top of your head.  Begin to have breakfast in the morning, and work on trying to make it a balance of protein and healthy carbs.  Begin to look for whole grain carbs like whole grain pastas, "Low carb" tortillas, and whole wheat breads/crackers/cereals.  Consider Fairlife brand milk for a low fat or free, high protein option.  Use a combination of these three strategies to effectively lower your consumption of saturated fats. 1) Consume a smaller portion when having foods high in saturated fats 2) Consume foods high in saturated fat less frequently 3) Find a reduced or low fat version of that food.

## 2021-07-13 NOTE — Progress Notes (Signed)
Medical Nutrition Therapy  Appointment Start time:  9476  Appointment End time:  30  Primary concerns today: Reducing Cholesterol, Weight Loss  Referral diagnosis: E66.01 - Morbid Obesity Preferred learning style: No preference indicated Learning readiness: Ready   NUTRITION ASSESSMENT   Anthropometrics  Ht: 5'9" Wt: 276.6 lbs Body mass index is 40.85 kg/m.  Clinical Medical Hx: HTN, OSA, Chronic Pancreatitis, Hypokalemia, Osteoarthritis Medications: Creon, Lipitor, Protonix Labs: TC - 229, HDL - 36, LDL - 169 Notable Signs/Symptoms: N/A  Lifestyle & Dietary Hx Pt reports history of COVID that hospitalized them in 2021, and required supplemental oxygen for 3 months. Pt reports lingering tachycardia and fatigue.  Pt has history of pancreatitis, that led to weight loss starting in their teenage years. Pt is currently taking Creon and reports substantial improvement in symptoms. If not taking Creon, pt notices symptoms immediately. Pt uses a walking cane due to sciatic, degenerative disc disease, and osteoarthritis. Pt reports slowly increasing their ambulation, has a goal of getting up to 30 minutes of walking a day. Pt is currently at about 3,000 steps per day Pt reports history of hypoglycemia, symptoms include fatigue, shakiness, anxiety, mental fog. Pt states they will eat chocolate or a honey bun to raise their sugar back up, orange juice does not work. Pt is currently following a low fat diet due to chronic pancreatitis, eating less fried foods and red meat. Pt is choosing lean red meats.  Pt reports their daughter cooks 3 days a week, and pt will cook the other 4 days. Tuesday are Taco day, and Thursday is pasta day at home. Pt normally does not eat breakfast, pt reports being  too busy in the morning.   Estimated daily fluid intake: 94 oz Supplements: Vitamin D, Metamucil, Fish Oil, Zinc Sleep: OSA, uses CPAP, gets more quality sleep than before, Takes less naps  now. Stress / self-care: Fluctuates, elevated at times due to conflicts amongst daughters. Work stress is mild. Current average weekly physical activity: ADLs  24-Hr Dietary Recall First Meal: No breakfast Snack: 24 oz water, cup of coffee w/ cream and sugar Second Meal: McDouble, Large fries, Large orange Fanta Snack: none Third Meal: Sauteed Shrimp, roasted potatoes, bottle water Snack: bottled water Beverages: coffee, water, soda   NUTRITION DIAGNOSIS  NB-1.1 Food and nutrition-related knowledge deficit As related to hyperlipidemia.  As evidenced by total serum cholesterol of 229 mg/dL, serum LDL of 169, and dietary recall high in full fat dairy.   NUTRITION INTERVENTION  Nutrition education (E-1) on the following topics:  Educate pt on the difference between LDL and HDL cholesterol.  Educate pt on the factors that can increase and decrease HDL cholesterol, including exercise to increase HDL, and tobacco use to decrease HDL.  Educate pt on factors that can elevate LDL cholesterol, including high dietary intake of saturated fats. Educate pt on identifying sources of saturated fats, and how to make alternative food choices to lower saturated fat intake. Educate pt on the role of soluble fiber in binding to cholesterol in the GI tract an eliminating it from the body. Educate pt on dietary sources of soluble fiber.  Educate pt on the potential dietary causes of hypertriglyceridemia, including concentrated sugars and sugar sweetened beverages. Educate on the role of elevated LDL,total cholesterol, and triglycerides on cardiovascular health. Educate pt on the role of physical activity in lowering LDL and increasing HDL cholesterol.   Handouts Provided Include  Food to Lower Your Cholesterol Soluble vs. Insoluble Food List Proteins  food list  Learning Style & Readiness for Change Teaching method utilized: Visual & Auditory  Demonstrated degree of understanding via: Teach Back  Barriers  to learning/adherence to lifestyle change: None  Goals Established by Pt Take your Vitamin D with your last meal in the evening. Give yourself 5 minutes after waking up before doing anything to assess how you feel. Pay attention to how your body feels, starting at your toes and moving all the way up your body to the top of your head. Begin to have breakfast in the morning, and work on trying to make it a balance of protein and healthy carbs. Begin to look for whole grain carbs like whole grain pastas, "Low carb" tortillas, and whole wheat breads/crackers/cereals. Consider Fairlife brand milk for a low fat or free, high protein option. Use a combination of these three strategies to effectively lower your consumption of saturated fats. 1) Consume a smaller portion when having foods high in saturated fats 2) Consume foods high in saturated fat less frequently 3) Find a reduced or low fat version of that food.   MONITORING & EVALUATION Dietary intake, weekly physical activity, and saturated fat intake in 3 months.  Next Steps  Patient is to follow up with RD.

## 2021-07-15 ENCOUNTER — Other Ambulatory Visit: Payer: Self-pay

## 2021-07-15 ENCOUNTER — Ambulatory Visit: Payer: BC Managed Care – PPO | Admitting: Endocrinology

## 2021-07-15 VITALS — BP 140/82 | HR 70 | Ht 69.0 in | Wt 278.8 lb

## 2021-07-15 DIAGNOSIS — K861 Other chronic pancreatitis: Secondary | ICD-10-CM

## 2021-07-15 DIAGNOSIS — E221 Hyperprolactinemia: Secondary | ICD-10-CM | POA: Diagnosis not present

## 2021-07-15 DIAGNOSIS — E785 Hyperlipidemia, unspecified: Secondary | ICD-10-CM

## 2021-07-15 NOTE — Patient Instructions (Signed)
Blood tests are requested for you today.  We'll let you know about the results.   If the thyroid is just a tiny bit off, we won't bother with medication now.    Please come back for a follow-up appointment in 1 year.

## 2021-07-15 NOTE — Progress Notes (Signed)
Subjective:    Patient ID: Beth [name], female    DOB: 10-09-1975, 46 y.o.   MRN: [id number]  HPI Pt returns for f/u of hyperprolactinemia (dx'ed 2013, when she presented with galactorrhea; she has never had pituitary imaging; she has had no menses since 2012.  She is G4P3; she takes chronic phenergan, for nausea (chronic pancreatitis; she has Mirena IUD).  pt states she feels well in general.   She was found to have suppressed TSH (no h/o thyroid probs; chest CT in 2021 showed normal medistinal thyroid; she has never taken medication for this).   Past Medical History:  Diagnosis Date   Asthma    Chronic pancreatitis (HCC)    DDD (degenerative disc disease), lumbosacral    DJD (degenerative joint disease)    Hyperlipidemia    Hypertension    Hypoglycemia    Obesity    Osteoarthritis    Prolactinoma ([place])    Sciatica     Past Surgical History:  Procedure Laterality Date   CHOLECYSTECTOMY     COLONOSCOPY      Social History   Socioeconomic History   Marital status: Divorced    Spouse name: Not on file   Number of children: Not on file   Years of education: Not on file   Highest education level: Not on file  Occupational History   Not on file  Tobacco Use   Smoking status: Never   Smokeless tobacco: Never  Vaping Use   Vaping Use: Never used  Substance and Sexual Activity   Alcohol use: Never   Drug use: Never   Sexual activity: Yes  Other Topics Concern   Not on file  Social History Narrative   Not on file   Social Determinants of Health   Financial Resource Strain: Not on file  Food Insecurity: Not on file  Transportation Needs: Not on file  Physical Activity: Not on file  Stress: Not on file  Social Connections: Not on file  Intimate Partner Violence: Not on file    Current Outpatient Medications on File Prior to Visit  Medication Sig Dispense Refill   albuterol (PROAIR HFA) 108 (90 Base) MCG/ACT inhaler Inhale 1 puff into the lungs every 4  (four) hours as needed for wheezing or shortness of breath. 6.7 g 0   Ascorbic Acid (VITAMIN C) 1000 MG tablet Take 1,000 mg by mouth daily.     atorvastatin (LIPITOR) 40 MG tablet Take 1 tablet (40 mg total) by mouth daily. 90 tablet 1   baclofen (LIORESAL) 10 MG tablet Take 0.5-1 tablets (5-10 mg total) by mouth 3 (three) times daily as needed for muscle spasms. 30 each 3   benzonatate (TESSALON) 200 MG capsule Take 1 capsule (200 mg total) by mouth 3 (three) times daily as needed for cough. 30 capsule 3   Butenafine HCl (MENTAX) 1 % cream Apply 1 application topically 2 (two) times daily. 24 g 1   Cholecalciferol (VITAMIN D3 PO) Take 1 capsule by mouth daily.     Docusate Sodium (COLACE PO) Take 1 capsule by mouth daily.     EPINEPHrine 0.3 mg/0.3 mL IJ SOAJ injection Inject 0.3 mg into the muscle once.     fluticasone (FLONASE) 50 MCG/ACT nasal spray Place 2 sprays into both nostrils daily. 16 g 3   levonorgestrel (MIRENA) 20 MCG/24HR IUD 1 each by Intrauterine route once.      nabumetone (RELAFEN) 500 MG tablet TAKE 1 TABLET(500 MG) BY MOUTH TWICE DAILY AS NEEDED  60 tablet 3   omega-3 fish oil (MAXEPA) 1000 MG CAPS capsule Take 2 capsules by mouth daily.      Pancrelipase, Lip-Prot-Amyl, (CREON) 24000-76000 units CPEP Take 2 capsules by mouth See admin instructions. Take 2 capsules three times daily with food and 1 capsule twice daily with snacks.     pantoprazole (PROTONIX) 20 MG tablet Take 20 mg by mouth daily.     Psyllium (METAMUCIL PO) Take 1 capsule by mouth daily.     Trospium Chloride 60 MG CP24 Take 1 capsule by mouth daily.     Zinc Sulfate (ZINC 15 PO) Take 1 tablet by mouth daily.     No current facility-administered medications on file prior to visit.    Allergies  Allergen Reactions   Bahia Swelling   [place] Grass Hives   Cedar Swelling   Cladosporium Cladosporioides Hives   Dust Mite Extract Hives   Elm Bark [Ulmus Fulva] Swelling   Lidocaine Hcl Swelling   Molds  & Smuts Hives   Mugwort Hives   Nettle [place] Dioica] Hives   Sorrel-Dock Mix [Sheep Sorrel-Yellow Dock] Hives   Timothy Grass Pollen Allergen Hives   Xylocaine [Lidocaine] Swelling    Family History  Problem Relation Age of Onset   Other Paternal Aunt        pituitary surgery   Hypertension Mother    Diabetes Mother    Colon cancer Mother    Arthritis Mother    Thyroid disease Mother    Asthma Mother    Diabetes Father    Hypertension Father    Bell's palsy Father     BP 140/82    Pulse 70    Ht 5\' 9"  (1.753 m)    Wt 278 lb 12.8 oz (126.5 kg)    SpO2 97%    BMI 41.17 kg/m    Review of Systems Nausea is minimal.  Denies abd pain.      Objective:   Physical Exam VITAL SIGNS:  See vs page GENERAL: no distress NECK: There is no palpable thyroid enlargement.  No thyroid nodule is palpable.  No palpable lymphadenopathy at the anterior neck.    Prolactin=40    Assessment & Plan:  Hyperprolactinemia: uncontrolled.  I have sent a prescription to your pharmacy, to increase parlodel.  Pt also requests other labs.

## 2021-07-16 LAB — LIPID PANEL
Cholesterol: 214 mg/dL — ABNORMAL HIGH (ref <200)
HDL: 35 mg/dL — ABNORMAL LOW (ref >=50)
LDL Cholesterol (Calc): 150 mg/dL (calc) — ABNORMAL HIGH
Non-HDL Cholesterol (Calc): 179 mg/dL (calc) — ABNORMAL HIGH (ref <130)
Total CHOL/HDL Ratio: 6.1 (calc) — ABNORMAL HIGH (ref <5.0)
Triglycerides: 156 mg/dL — ABNORMAL HIGH (ref <150)

## 2021-07-16 LAB — LIPASE: Lipase: 28 U/L (ref 7–60)

## 2021-07-16 LAB — PROLACTIN: Prolactin: 40 ng/mL — ABNORMAL HIGH

## 2021-07-16 LAB — AMYLASE: Amylase: 130 U/L — ABNORMAL HIGH (ref 21–101)

## 2021-07-16 MED ORDER — BROMOCRIPTINE MESYLATE 5 MG PO CAPS: 5.0000 mg | ORAL_CAPSULE | Freq: Every day | ORAL | 3 refills | Status: DC

## 2021-08-02 DIAGNOSIS — G4733 Obstructive sleep apnea (adult) (pediatric): Secondary | ICD-10-CM | POA: Diagnosis not present

## 2021-08-03 DIAGNOSIS — G4733 Obstructive sleep apnea (adult) (pediatric): Secondary | ICD-10-CM | POA: Diagnosis not present

## 2021-08-26 DIAGNOSIS — Z113 Encounter for screening for infections with a predominantly sexual mode of transmission: Secondary | ICD-10-CM | POA: Diagnosis not present

## 2021-08-26 DIAGNOSIS — Z1239 Encounter for other screening for malignant neoplasm of breast: Secondary | ICD-10-CM | POA: Diagnosis not present

## 2021-08-26 DIAGNOSIS — N898 Other specified noninflammatory disorders of vagina: Secondary | ICD-10-CM | POA: Diagnosis not present

## 2021-08-26 DIAGNOSIS — Z01419 Encounter for gynecological examination (general) (routine) without abnormal findings: Secondary | ICD-10-CM | POA: Diagnosis not present

## 2021-08-26 DIAGNOSIS — B009 Herpesviral infection, unspecified: Secondary | ICD-10-CM | POA: Diagnosis not present

## 2021-08-26 DIAGNOSIS — Z1231 Encounter for screening mammogram for malignant neoplasm of breast: Secondary | ICD-10-CM | POA: Diagnosis not present

## 2021-08-26 DIAGNOSIS — B0082 Herpes simplex myelitis: Secondary | ICD-10-CM | POA: Diagnosis not present

## 2021-08-30 DIAGNOSIS — G4733 Obstructive sleep apnea (adult) (pediatric): Secondary | ICD-10-CM | POA: Diagnosis not present

## 2021-09-22 DIAGNOSIS — Z1231 Encounter for screening mammogram for malignant neoplasm of breast: Secondary | ICD-10-CM | POA: Diagnosis not present

## 2021-09-30 DIAGNOSIS — G4733 Obstructive sleep apnea (adult) (pediatric): Secondary | ICD-10-CM | POA: Diagnosis not present

## 2021-10-07 DIAGNOSIS — H5340 Unspecified visual field defects: Secondary | ICD-10-CM | POA: Diagnosis not present

## 2021-10-12 ENCOUNTER — Encounter: Payer: Self-pay | Admitting: Dietician

## 2021-10-12 ENCOUNTER — Encounter: Payer: BC Managed Care – PPO | Attending: Primary Care | Admitting: Dietician

## 2021-10-12 VITALS — Ht 69.0 in | Wt 284.5 lb

## 2021-10-12 DIAGNOSIS — Z6841 Body Mass Index (BMI) 40.0 and over, adult: Secondary | ICD-10-CM | POA: Diagnosis not present

## 2021-10-12 DIAGNOSIS — Z713 Dietary counseling and surveillance: Secondary | ICD-10-CM | POA: Diagnosis not present

## 2021-10-12 DIAGNOSIS — E669 Obesity, unspecified: Secondary | ICD-10-CM

## 2021-10-12 NOTE — Patient Instructions (Addendum)
Continue to work towards hitting your goal of 3,000 steps a day. Consider moving up 1,000 steps in a month each month through the summer. ? ?Move your dietary pattern to eating about every 3 hours. Have a small snack between meal times and cut meal sizes down slightly. ? ?Choose lower calorie snacks like rice cakes, fruit/nut/cheese snack packs. ? ?Consider making breakfast burritos with "low-carb" tortillas, egg beaters, low-fat cheese, and lots of vegetables. ? ?Use your wrist weights at home and do seated arm exercises either at work or at home. Consider putting a resistance band around your legs and do thigh exercises. ?

## 2021-10-12 NOTE — Progress Notes (Signed)
Medical Nutrition Therapy  ?Appointment Start time:  1650  Appointment End time:  7867 ? ?Primary concerns today: Reducing Cholesterol, Weight Loss  ?Referral diagnosis: E66.01 - Morbid Obesity ?Preferred learning style: No preference indicated ?Learning readiness: Ready ? ? ?NUTRITION ASSESSMENT  ? ?Anthropometrics  ?Ht: 5'9" ?Wt: 284.5 lbs ?Body mass index is 42.01 kg/m?. ? ?Clinical ?Medical Hx: HTN, OSA, Chronic Pancreatitis, Hypokalemia, Osteoarthritis ?Medications: Creon, Lipitor, Protonix ?Labs: TC - 229, HDL - 36, LDL - 169 ?NEW: (07/15/2021) TC - 214, HDL - 35, LDL - 150 ?Notable Signs/Symptoms: N/A ? ?Lifestyle & Dietary Hx ?Pt reports that their blood pressure was elevated last Friday at a doctor visit, and was still elevated when they got home later that day. BP reading this past week have been around 125-140/80-85.  ?Pt reports eating breakfast earlier has caused them to be hungry around 10:00 am, and also hungrier earlier in the evening. Breakfast has been a piece of fruit or a granola bar. ?Pt reports doing better with dairy, mostly cutting back on their cheese consumption and being more aware of the dairy that thy consume. Pt daughter has been trying to convince them to meal prep. ?Pt has been eating frozen steam in bag vegetables ?Pt reports lingering fatigue still, less frequent symptoms of hypoglycemia.  ? ? ?Estimated daily fluid intake: 94 oz ?Supplements: Vitamin D, Metamucil, Fish Oil, Zinc ?Sleep: OSA, uses CPAP, gets more quality sleep than before, Takes less naps now. ?Stress / self-care: Fluctuates, elevated at times due to conflicts amongst daughters. Work stress is mild. ?Current average weekly physical activity: ADLs ? ?24-Hr Dietary Recall ?First Meal: Banana, 2 cups decaf coffee w/sugar and creamer ?Snack: Snickers and Doritos ?Second Meal:Large Mcdonald's fries  ?Snack: none ?Third Meal: Grilled tilapia, roasted potatoes ?Snack: bottled water ?Beverages: coffee, water ? ? ?NUTRITION  DIAGNOSIS  ?NB-1.1 Food and nutrition-related knowledge deficit As related to hyperlipidemia.  As evidenced by total serum cholesterol of 229 mg/dL, serum LDL of 169, and dietary recall high in full fat dairy. ? ? ?NUTRITION INTERVENTION  ?Nutrition education (E-1) on the following topics:  ?Educate pt on the difference between LDL and HDL cholesterol.  ?Educate pt on the factors that can increase and decrease HDL cholesterol, including exercise to increase HDL, and tobacco use to decrease HDL.  ?Educate pt on factors that can elevate LDL cholesterol, including high dietary intake of saturated fats. Educate pt on identifying sources of saturated fats, and how to make alternative food choices to lower saturated fat intake. Educate pt on the role of soluble fiber in binding to cholesterol in the GI tract an eliminating it from the body. Educate pt on dietary sources of soluble fiber.  ?Educate pt on the potential dietary causes of hypertriglyceridemia, including concentrated sugars and sugar sweetened beverages. ?Educate on the role of elevated LDL,total cholesterol, and triglycerides on cardiovascular health. Educate pt on the role of physical activity in lowering LDL and increasing HDL cholesterol. ? ? ?Handouts Provided Include  ?Food to Lower Your Cholesterol ?Soluble vs. Insoluble Food List ?Proteins food list ? ?Learning Style & Readiness for Change ?Teaching method utilized: Visual & Auditory  ?Demonstrated degree of understanding via: Teach Back  ?Barriers to learning/adherence to lifestyle change: None ? ?Goals Established by Pt ?Continue to work towards hitting your goal of 3,000 steps a day. Consider moving up 1,000 steps in a month each month through the summer. ?Move your dietary pattern to eating about every 3 hours. Have a small snack between  meal times and cut meal sizes down slightly. ?Choose lower calorie snacks like rice cakes, fruit/nut/cheese snack packs. ?Consider making breakfast burritos with  "low-carb" tortillas, egg beaters, low-fat cheese, and lots of vegetables. ?Use your wrist weights at home and do seated arm exercises either at work or at home. Consider putting a resistance band around your legs and do thigh exercises. ? ?MONITORING & EVALUATION ?Dietary intake, weekly physical activity, and saturated fat intake in 3 months. ? ?Next Steps  ?Patient is to call [facility] follow up with RD. ? ?

## 2021-10-21 ENCOUNTER — Ambulatory Visit: Payer: BC Managed Care – PPO | Admitting: Endocrinology

## 2021-10-21 ENCOUNTER — Encounter: Payer: Self-pay | Admitting: Endocrinology

## 2021-10-21 VITALS — BP 126/84 | HR 69 | Ht 69.0 in | Wt 285.6 lb

## 2021-10-21 DIAGNOSIS — R7989 Other specified abnormal findings of blood chemistry: Secondary | ICD-10-CM | POA: Diagnosis not present

## 2021-10-21 DIAGNOSIS — D352 Benign neoplasm of pituitary gland: Secondary | ICD-10-CM | POA: Diagnosis not present

## 2021-10-21 DIAGNOSIS — E221 Hyperprolactinemia: Secondary | ICD-10-CM | POA: Diagnosis not present

## 2021-10-21 NOTE — Patient Instructions (Addendum)
Blood tests are requested for you today.  We'll let you know about the results.   ?If the prolactin is still high, we'll change the bromocriptine to a different medication.    ?Let's check the MRI.  you will receive a phone call, about a day and time for an appointment.  If this does not show a cause for the abnormal vision test, the next step is to see a neurology specialist.   ?You should have an endocrinology follow-up appointment in 6 months.   ?

## 2021-10-21 NOTE — Progress Notes (Signed)
? ?Subjective:  ? ? Patient ID: Beth [name], female    DOB: 19-Jan-1976, 46 y.o.   MRN: [id number] ? ?HPI ?Pt returns for f/u of hyperprolactinemia (dx'ed 2013, when she presented with galactorrhea; she has never had pituitary imaging; she has had no menses since 2012.  She is G4P3; she takes chronic phenergan, for nausea (chronic pancreatitis; she has Mirena IUD).  pt brings eye dr report showing bilat inf VF defect.  Pt says she cannot take the 5 mg parlodel, due to nausea (she takes 2.5 mg on most days) ?She was found to have suppressed TSH (no h/o thyroid probs; chest CT in 2021 showed normal mediastinal thyroid; she has never taken medication for this).   ?Past Medical History:  ?Diagnosis Date  ? Asthma   ? Chronic pancreatitis ([place])   ? DDD (degenerative disc disease), lumbosacral   ? DJD (degenerative joint disease)   ? Hyperlipidemia   ? Hypertension   ? Hypoglycemia   ? Obesity   ? Osteoarthritis   ? Prolactinoma ([place])   ? Sciatica   ? ? ?Past Surgical History:  ?Procedure Laterality Date  ? CHOLECYSTECTOMY    ? COLONOSCOPY    ? ? ?Social History  ? ?Socioeconomic History  ? Marital status: Divorced  ?  Spouse name: Not on file  ? Number of children: Not on file  ? Years of education: Not on file  ? Highest education level: Not on file  ?Occupational History  ? Not on file  ?Tobacco Use  ? Smoking status: Never  ? Smokeless tobacco: Never  ?Vaping Use  ? Vaping Use: Never used  ?Substance and Sexual Activity  ? Alcohol use: Never  ? Drug use: Never  ? Sexual activity: Yes  ?Other Topics Concern  ? Not on file  ?Social History Narrative  ? Not on file  ? ?Social Determinants of Health  ? ?Financial Resource Strain: Not on file  ?Food Insecurity: Not on file  ?Transportation Needs: Not on file  ?Physical Activity: Not on file  ?Stress: Not on file  ?Social Connections: Not on file  ?Intimate Partner Violence: Not on file  ? ? ?Current Outpatient Medications on File Prior to Visit  ?Medication Sig Dispense  Refill  ? albuterol (PROAIR HFA) 108 (90 Base) MCG/ACT inhaler Inhale 1 puff into the lungs every 4 (four) hours as needed for wheezing or shortness of breath. 6.7 g 0  ? Ascorbic Acid (VITAMIN C) 1000 MG tablet Take 1,000 mg by mouth daily.    ? atorvastatin (LIPITOR) 40 MG tablet Take 1 tablet (40 mg total) by mouth daily. 90 tablet 1  ? baclofen (LIORESAL) 10 MG tablet Take 0.5-1 tablets (5-10 mg total) by mouth 3 (three) times daily as needed for muscle spasms. 30 each 3  ? benzonatate (TESSALON) 200 MG capsule Take 1 capsule (200 mg total) by mouth 3 (three) times daily as needed for cough. 30 capsule 3  ? bromocriptine (PARLODEL) 5 MG capsule Take 1 capsule (5 mg total) by mouth daily. 90 capsule 3  ? Butenafine HCl (MENTAX) 1 % cream Apply 1 application topically 2 (two) times daily. 24 g 1  ? Cholecalciferol (VITAMIN D3 PO) Take 1 capsule by mouth daily.    ? Docusate Sodium (COLACE PO) Take 1 capsule by mouth daily.    ? EPINEPHrine 0.3 mg/0.3 mL IJ SOAJ injection Inject 0.3 mg into the muscle once.    ? fluticasone (FLONASE) 50 MCG/ACT nasal spray Place 2 sprays  into both nostrils daily. 16 g 3  ? levonorgestrel (MIRENA) 20 MCG/24HR IUD 1 each by Intrauterine route once.     ? nabumetone (RELAFEN) 500 MG tablet TAKE 1 TABLET(500 MG) BY MOUTH TWICE DAILY AS NEEDED 60 tablet 3  ? omega-3 fish oil (MAXEPA) 1000 MG CAPS capsule Take 2 capsules by mouth daily.     ? Pancrelipase, Lip-Prot-Amyl, (CREON) 24000-76000 units CPEP Take 2 capsules by mouth See admin instructions. Take 2 capsules three times daily with food and 1 capsule twice daily with snacks.    ? pantoprazole (PROTONIX) 20 MG tablet Take 20 mg by mouth daily.    ? Psyllium (METAMUCIL PO) Take 1 capsule by mouth daily.    ? Trospium Chloride 60 MG CP24 Take 1 capsule by mouth daily.    ? Zinc Sulfate (ZINC 15 PO) Take 1 tablet by mouth daily.    ? ?No current facility-administered medications on file prior to visit.  ? ? ?Allergies  ?Allergen  Reactions  ? [place] Swelling  ? [place] Grass Hives  ? Cedar Swelling  ? Cladosporium Cladosporioides Hives  ? Dust Mite Extract Hives  ? Elm Bark [Ulmus Fulva] Swelling  ? Lidocaine Hcl Swelling  ? Molds & Smuts Hives  ? Mugwort Hives  ? Nettle [Nettle (Urtica Dioica)] Hives  ? Sorrel-Dock Mix [Sheep Sorrel-Yellow Dock] Hives  ? Jalene Mullet Pollen Allergen Hives  ? Xylocaine [Lidocaine] Swelling  ? ? ?Family History  ?Problem Relation Age of Onset  ? Other Paternal Aunt   ?     pituitary surgery  ? Hypertension Mother   ? Diabetes Mother   ? Colon cancer Mother   ? Arthritis Mother   ? Thyroid disease Mother   ? Asthma Mother   ? Diabetes Father   ? Hypertension Father   ? Bell's palsy Father   ? ? ?BP 126/84 (BP Location: Left Arm, Patient Position: Sitting, Cuff Size: Normal)   Pulse 69   Ht '5\' 9"'$  (1.753 m)   Wt 285 lb 9.6 oz (129.5 kg)   SpO2 97%   BMI 42.18 kg/m?  ? ? ?Review of Systems ?Denies HA ?   ?Objective:  ? Physical Exam ?VITAL SIGNS:  See vs page.   ?GENERAL: no distress. ? ? ?Prolactin=2 ?   ?Assessment & Plan:  ?Hyperprolactinemia: well-controlled.  ?Nausea, due to bromocriptine.  If necessary, we'll change to cabergoline. ?Low TSH: recheck today ?VF defect: check MRI ?

## 2021-10-22 LAB — BASIC METABOLIC PANEL
BUN: 16 mg/dL (ref 7–25)
CO2: 26 mmol/L (ref 20–32)
Calcium: 9.5 mg/dL (ref 8.6–10.2)
Chloride: 108 mmol/L (ref 98–110)
Creat: 0.86 mg/dL (ref 0.50–0.99)
Glucose, Bld: 91 mg/dL (ref 65–99)
Potassium: 3.8 mmol/L (ref 3.5–5.3)
Sodium: 142 mmol/L (ref 135–146)

## 2021-10-22 LAB — T4, FREE: Free T4: 1 ng/dL (ref 0.8–1.8)

## 2021-10-22 LAB — TSH: TSH: 0.71 mIU/L

## 2021-10-22 LAB — PROLACTIN: Prolactin: 1.8 ng/mL — ABNORMAL LOW

## 2021-10-31 DIAGNOSIS — G4733 Obstructive sleep apnea (adult) (pediatric): Secondary | ICD-10-CM | POA: Diagnosis not present

## 2021-11-12 ENCOUNTER — Other Ambulatory Visit: Payer: BC Managed Care – PPO

## 2021-11-12 ENCOUNTER — Ambulatory Visit
Admission: RE | Admit: 2021-11-12 | Discharge: 2021-11-12 | Disposition: A | Discharge status: Home / Self Care | Payer: BC Managed Care – PPO | Source: Ambulatory Visit | Attending: Endocrinology | Admitting: Endocrinology

## 2021-11-12 DIAGNOSIS — D496 Neoplasm of unspecified behavior of brain: Secondary | ICD-10-CM | POA: Diagnosis not present

## 2021-11-12 DIAGNOSIS — H547 Unspecified visual loss: Secondary | ICD-10-CM | POA: Diagnosis not present

## 2021-11-12 DIAGNOSIS — E221 Hyperprolactinemia: Secondary | ICD-10-CM

## 2021-11-12 DIAGNOSIS — R220 Localized swelling, mass and lump, head: Secondary | ICD-10-CM | POA: Diagnosis not present

## 2021-11-12 MED ORDER — GADOBENATE DIMEGLUMINE 529 MG/ML IV SOLN
10.0000 mL | Freq: Once | INTRAVENOUS | Status: AC | PRN
  Administered 2021-11-12: 10 mL via INTRAVENOUS

## 2021-11-14 ENCOUNTER — Telehealth: Payer: Self-pay

## 2021-11-14 ENCOUNTER — Other Ambulatory Visit: Payer: Self-pay | Admitting: Internal Medicine

## 2021-11-14 DIAGNOSIS — D352 Benign neoplasm of pituitary gland: Secondary | ICD-10-CM

## 2021-11-14 NOTE — Telephone Encounter (Signed)
Very large 4.1 x 3.4 by 4.1 cm sellar/suprasellar mass with significant mass effect on the optic chiasm, cavernous sinus invasion, and extension into the sphenoid sinus, probably a pituitary macroadenoma. Recommend neurosurgical consultation. A   These results will be called to the ordering clinician or representative by the Radiologist Assistant, and communication documented in the PACS or [employer].

## 2021-11-14 NOTE — Telephone Encounter (Signed)
Patient has been notified and come in the tomorrow for labs.

## 2021-11-14 NOTE — Telephone Encounter (Signed)
Beth [name], please let her know that there is a pituitary tumor compressing on her optic nerves.  Therefore, we need her to see neurosurgery right away.  I also would like to get pituitary labs, also please ask her to come by our clinic tomorrow morning for these.  Labs are in.  In the meantime, I will put the referral to neurosurgery (urgent).  Please let us know if she did not hear from them in the next 2 to 3 days.

## 2021-11-15 ENCOUNTER — Other Ambulatory Visit (INDEPENDENT_AMBULATORY_CARE_PROVIDER_SITE_OTHER): Payer: BC Managed Care – PPO

## 2021-11-15 ENCOUNTER — Other Ambulatory Visit: Payer: Self-pay | Admitting: Internal Medicine

## 2021-11-15 DIAGNOSIS — D352 Benign neoplasm of pituitary gland: Secondary | ICD-10-CM

## 2021-11-15 LAB — FOLLICLE STIMULATING HORMONE: FSH: 5.8 m[IU]/mL

## 2021-11-15 LAB — LUTEINIZING HORMONE: LH: 2.28 m[IU]/mL

## 2021-11-15 LAB — CORTISOL: Cortisol, Plasma: 6 ug/dL

## 2021-11-15 LAB — T3, FREE: T3, Free: 2.6 pg/mL (ref 2.3–4.2)

## 2021-11-15 LAB — TSH: TSH: 0.73 u[IU]/mL (ref 0.35–5.50)

## 2021-11-15 LAB — T4, FREE: Free T4: 0.7 ng/dL (ref 0.60–1.60)

## 2021-11-18 ENCOUNTER — Encounter: Payer: Self-pay | Admitting: Internal Medicine

## 2021-11-18 DIAGNOSIS — Z6838 Body mass index (BMI) 38.0-38.9, adult: Secondary | ICD-10-CM | POA: Diagnosis not present

## 2021-11-18 DIAGNOSIS — D352 Benign neoplasm of pituitary gland: Secondary | ICD-10-CM | POA: Diagnosis not present

## 2021-11-25 LAB — ACTH: C206 ACTH: 22 pg/mL (ref 6–50)

## 2021-11-25 LAB — PROLACTIN W/DILUTION: PROLACTIN,UNDILUTED: 2.8 ng/mL (ref 2.0–30.0)

## 2021-11-25 LAB — INSULIN-LIKE GROWTH FACTOR
IGF-I, LC/MS: 124 ng/mL (ref 52–328)
Z-Score (Female): -0.3 SD (ref ?–2.0)

## 2021-12-01 DIAGNOSIS — G4733 Obstructive sleep apnea (adult) (pediatric): Secondary | ICD-10-CM | POA: Diagnosis not present

## 2021-12-01 DIAGNOSIS — K8689 Other specified diseases of pancreas: Secondary | ICD-10-CM | POA: Diagnosis not present

## 2021-12-01 DIAGNOSIS — K219 Gastro-esophageal reflux disease without esophagitis: Secondary | ICD-10-CM | POA: Diagnosis not present

## 2021-12-01 DIAGNOSIS — Z86010 Personal history of colonic polyps: Secondary | ICD-10-CM | POA: Diagnosis not present

## 2021-12-01 DIAGNOSIS — K5900 Constipation, unspecified: Secondary | ICD-10-CM | POA: Diagnosis not present

## 2021-12-09 DIAGNOSIS — J342 Deviated nasal septum: Secondary | ICD-10-CM | POA: Diagnosis not present

## 2021-12-09 DIAGNOSIS — J3489 Other specified disorders of nose and nasal sinuses: Secondary | ICD-10-CM | POA: Diagnosis not present

## 2021-12-09 DIAGNOSIS — R220 Localized swelling, mass and lump, head: Secondary | ICD-10-CM | POA: Diagnosis not present

## 2021-12-09 DIAGNOSIS — D352 Benign neoplasm of pituitary gland: Secondary | ICD-10-CM | POA: Diagnosis not present

## 2021-12-12 ENCOUNTER — Other Ambulatory Visit: Payer: Self-pay | Admitting: Neurological Surgery

## 2021-12-12 DIAGNOSIS — D352 Benign neoplasm of pituitary gland: Secondary | ICD-10-CM | POA: Diagnosis not present

## 2021-12-12 DIAGNOSIS — J342 Deviated nasal septum: Secondary | ICD-10-CM | POA: Diagnosis not present

## 2021-12-16 ENCOUNTER — Encounter (INDEPENDENT_AMBULATORY_CARE_PROVIDER_SITE_OTHER): Payer: Self-pay | Admitting: Primary Care

## 2021-12-16 ENCOUNTER — Ambulatory Visit (INDEPENDENT_AMBULATORY_CARE_PROVIDER_SITE_OTHER): Payer: BC Managed Care – PPO | Admitting: Primary Care

## 2021-12-16 VITALS — BP 133/82 | HR 73 | Resp 16 | Ht 69.02 in | Wt 285.0 lb

## 2021-12-16 DIAGNOSIS — N912 Amenorrhea, unspecified: Secondary | ICD-10-CM

## 2021-12-16 DIAGNOSIS — E876 Hypokalemia: Secondary | ICD-10-CM | POA: Diagnosis not present

## 2021-12-16 DIAGNOSIS — E785 Hyperlipidemia, unspecified: Secondary | ICD-10-CM | POA: Diagnosis not present

## 2021-12-18 LAB — CMP14+EGFR
ALT: 17 IU/L (ref 0–32)
AST: 17 IU/L (ref 0–40)
Albumin/Globulin Ratio: 1.5 (ref 1.2–2.2)
Albumin: 4.1 g/dL (ref 3.8–4.8)
Alkaline Phosphatase: 114 IU/L (ref 44–121)
BUN/Creatinine Ratio: 16 (ref 9–23)
BUN: 11 mg/dL (ref 6–24)
Bilirubin Total: <0.2 mg/dL (ref 0.0–1.2)
CO2: 21 mmol/L (ref 20–29)
Calcium: 9.4 mg/dL (ref 8.7–10.2)
Chloride: 103 mmol/L (ref 96–106)
Creatinine, Ser: 0.7 mg/dL (ref 0.57–1.00)
Globulin, Total: 2.7 g/dL (ref 1.5–4.5)
Glucose: 86 mg/dL (ref 70–99)
Potassium: 4.4 mmol/L (ref 3.5–5.2)
Sodium: 141 mmol/L (ref 134–144)
Total Protein: 6.8 g/dL (ref 6.0–8.5)
eGFR: 109 mL/min/{1.73_m2} (ref >59)

## 2021-12-18 LAB — CBC WITH DIFFERENTIAL/PLATELET
Basophils Absolute: 0 10*3/uL (ref 0.0–0.2)
Basos: 0 %
EOS (ABSOLUTE): 0.2 10*3/uL (ref 0.0–0.4)
Eos: 4 %
Hematocrit: 36.5 % (ref 34.0–46.6)
Hemoglobin: 12.5 g/dL (ref 11.1–15.9)
Immature Grans (Abs): 0 10*3/uL (ref 0.0–0.1)
Immature Granulocytes: 0 %
Lymphocytes Absolute: 2.3 10*3/uL (ref 0.7–3.1)
Lymphs: 47 %
MCH: 28.5 pg (ref 26.6–33.0)
MCHC: 34.2 g/dL (ref 31.5–35.7)
MCV: 83 fL (ref 79–97)
Monocytes Absolute: 0.5 10*3/uL (ref 0.1–0.9)
Monocytes: 10 %
Neutrophils Absolute: 1.9 10*3/uL (ref 1.4–7.0)
Neutrophils: 39 %
Platelets: 272 10*3/uL (ref 150–450)
RBC: 4.39 x10E6/uL (ref 3.77–5.28)
RDW: 14.3 % (ref 11.7–15.4)
WBC: 4.8 10*3/uL (ref 3.4–10.8)

## 2021-12-18 LAB — LIPID PANEL
Chol/HDL Ratio: 8.5 ratio — ABNORMAL HIGH (ref 0.0–4.4)
Cholesterol, Total: 204 mg/dL — ABNORMAL HIGH (ref 100–199)
HDL: 24 mg/dL — ABNORMAL LOW (ref >39)
LDL Chol Calc (NIH): 65 mg/dL (ref 0–99)
Triglycerides: 750 mg/dL (ref 0–149)
VLDL Cholesterol Cal: 115 mg/dL — ABNORMAL HIGH (ref 5–40)

## 2021-12-21 ENCOUNTER — Other Ambulatory Visit (INDEPENDENT_AMBULATORY_CARE_PROVIDER_SITE_OTHER): Payer: Self-pay | Admitting: Primary Care

## 2021-12-21 DIAGNOSIS — E785 Hyperlipidemia, unspecified: Secondary | ICD-10-CM

## 2021-12-21 MED ORDER — ROSUVASTATIN CALCIUM 40 MG PO TABS: 40.0000 mg | ORAL_TABLET | Freq: Every day | ORAL | 3 refills | Status: DC

## 2021-12-21 MED ORDER — FENOFIBRATE 145 MG PO TABS: 145.0000 mg | ORAL_TABLET | Freq: Every day | ORAL | 1 refills | Status: DC

## 2021-12-22 ENCOUNTER — Encounter (INDEPENDENT_AMBULATORY_CARE_PROVIDER_SITE_OTHER): Payer: Self-pay | Admitting: Primary Care

## 2021-12-26 NOTE — Pre-Procedure Instructions (Signed)
Surgical Instructions    Your procedure is scheduled on Wednesday, Ju;y 12th.  Report to [facility] Main Entrance "A" at 06:30 A.M., then check in with the Admitting office.  Call this number if you have problems the morning of surgery:  [phone]   If you have any questions prior to your surgery date call [phone]: Open Monday-Friday 8am-4pm    Remember:  Do not eat after midnight the night before your surgery  You may drink clear liquids until 05:30 AM the morning of your surgery.   Clear liquids allowed are: Water, Non-Citrus Juices (without pulp), Carbonated Beverages, Clear Tea, Black Coffee Only (NO MILK, CREAM OR POWDERED CREAMER of any kind), and Gatorade.    Take these medicines the morning of surgery with A SIP OF WATER  bromocriptine (PARLODEL)  fenofibrate (TRICOR)  fluticasone (FLONASE) pantoprazole (PROTONIX)  rosuvastatin (CRESTOR)   If needed: baclofen (LIORESAL) benzonatate (TESSALON)     As of today, STOP taking any Aspirin (unless otherwise instructed by your surgeon) Aleve, Naproxen, Ibuprofen, Motrin, Advil, Goody's, BC's, all herbal medications, fish oil, and all vitamins.                     Do NOT Smoke (Tobacco/Vaping) for 24 hours prior to your procedure.  If you use a CPAP at night, you may bring your mask/headgear for your overnight stay.   Contacts, glasses, piercing's, hearing aid's, dentures or partials may not be worn into surgery, please bring cases for these belongings.    For patients admitted to the hospital, discharge time will be determined by your treatment team.   Patients discharged the day of surgery will not be allowed to drive home, and someone needs to stay with them for 24 hours.  SURGICAL WAITING ROOM VISITATION Patients having surgery or a procedure may have two support people in the waiting room. These visitors may be switched out with other visitors if needed. Children under the age of 85 must have an adult  accompany them who is not the patient. If the patient needs to stay at the hospital during part of their recovery, the visitor guidelines for inpatient rooms apply.  Please refer to the [facility] website for the visitor guidelines for Inpatients (after your surgery is over and you are in a regular room).    Special instructions:   [facility]- Preparing For Surgery  Before surgery, you can play an important role. Because skin is not sterile, your skin needs to be as free of germs as possible. You can reduce the number of germs on your skin by washing with CHG (chlorahexidine gluconate) Soap before surgery.  CHG is an antiseptic cleaner which kills germs and bonds with the skin to continue killing germs even after washing.    Oral Hygiene is also important to reduce your risk of infection.  Remember - BRUSH YOUR TEETH THE MORNING OF SURGERY WITH YOUR REGULAR TOOTHPASTE  Please do not use if you have an allergy to CHG or antibacterial soaps. If your skin becomes reddened/irritated stop using the CHG.  Do not shave (including legs and underarms) for at least 48 hours prior to first CHG shower. It is OK to shave your face.  Please follow these instructions carefully.   Shower the NIGHT BEFORE SURGERY and the MORNING OF SURGERY  If you chose to wash your hair, wash your hair first as usual with your normal shampoo.  After you shampoo, rinse your hair and body thoroughly to remove the  shampoo.  Use CHG Soap as you would any other liquid soap. You can apply CHG directly to the skin and wash gently with a scrungie or a clean washcloth.   Apply the CHG Soap to your body ONLY FROM THE NECK DOWN.  Do not use on open wounds or open sores. Avoid contact with your eyes, ears, mouth and genitals (private parts). Wash Face and genitals (private parts)  with your normal soap.   Wash thoroughly, paying special attention to the area where your surgery will be performed.  Thoroughly rinse your body with  warm water from the neck down.  DO NOT shower/wash with your normal soap after using and rinsing off the CHG Soap.  Pat yourself dry with a CLEAN TOWEL.  Wear CLEAN PAJAMAS to bed the night before surgery  Place CLEAN SHEETS on your bed the night before your surgery  DO NOT SLEEP WITH PETS.   Day of Surgery: Take a shower with CHG soap. Do not wear jewelry or makeup Do not wear lotions, powders, perfumes, or deodorant. Do not shave 48 hours prior to surgery.   Do not bring valuables to the hospital.  [facility] is not responsible for any belongings or valuables. Do not wear nail polish, gel polish, artificial nails, or any other type of covering on natural nails (fingers and toes) If you have artificial nails or gel coating that need to be removed by a nail salon, please have this removed prior to surgery. Artificial nails or gel coating may interfere with anesthesia's ability to adequately monitor your vital signs. Wear Clean/Comfortable clothing the morning of surgery Remember to brush your teeth WITH YOUR REGULAR TOOTHPASTE.   Please read over the following fact sheets that you were given.    If you received a COVID test during your pre-op visit  it is requested that you wear a mask when out in public, stay away from anyone that may not be feeling well and notify your surgeon if you develop symptoms. If you have been in contact with anyone that has tested positive in the last 10 days please notify you surgeon.

## 2021-12-28 ENCOUNTER — Encounter (HOSPITAL_COMMUNITY): Payer: Self-pay

## 2021-12-28 ENCOUNTER — Other Ambulatory Visit: Payer: Self-pay

## 2021-12-28 ENCOUNTER — Encounter (HOSPITAL_COMMUNITY)
Admission: RE | Admit: 2021-12-28 | Discharge: 2021-12-28 | Disposition: A | Discharge status: Home / Self Care | Payer: BC Managed Care – PPO | Source: Ambulatory Visit | Attending: Neurological Surgery | Admitting: Neurological Surgery

## 2021-12-28 VITALS — BP 119/75 | HR 83 | Temp 97.9°F | Resp 17 | Ht 69.0 in | Wt 284.0 lb

## 2021-12-28 DIAGNOSIS — J1282 Pneumonia due to coronavirus disease 2019: Secondary | ICD-10-CM | POA: Diagnosis not present

## 2021-12-28 DIAGNOSIS — D352 Benign neoplasm of pituitary gland: Secondary | ICD-10-CM | POA: Insufficient documentation

## 2021-12-28 DIAGNOSIS — I081 Rheumatic disorders of both mitral and tricuspid valves: Secondary | ICD-10-CM | POA: Diagnosis not present

## 2021-12-28 DIAGNOSIS — E669 Obesity, unspecified: Secondary | ICD-10-CM | POA: Diagnosis not present

## 2021-12-28 DIAGNOSIS — Z8616 Personal history of COVID-19: Secondary | ICD-10-CM | POA: Diagnosis not present

## 2021-12-28 DIAGNOSIS — I10 Essential (primary) hypertension: Secondary | ICD-10-CM | POA: Diagnosis not present

## 2021-12-28 DIAGNOSIS — G4733 Obstructive sleep apnea (adult) (pediatric): Secondary | ICD-10-CM | POA: Insufficient documentation

## 2021-12-28 DIAGNOSIS — Z01818 Encounter for other preprocedural examination: Secondary | ICD-10-CM

## 2021-12-28 HISTORY — DX: Headache, unspecified: R51.9

## 2021-12-28 HISTORY — DX: Sleep apnea, unspecified: G47.30

## 2021-12-28 LAB — BASIC METABOLIC PANEL
Anion gap: 7 (ref 5–15)
BUN: 10 mg/dL (ref 6–20)
CO2: 28 mmol/L (ref 22–32)
Calcium: 9.5 mg/dL (ref 8.9–10.3)
Chloride: 106 mmol/L (ref 98–111)
Creatinine, Ser: 0.89 mg/dL (ref 0.44–1.00)
GFR, Estimated: >60 mL/min (ref >60)
Glucose, Bld: 97 mg/dL (ref 70–99)
Potassium: 3.8 mmol/L (ref 3.5–5.1)
Sodium: 141 mmol/L (ref 135–145)

## 2021-12-28 LAB — CBC
HCT: 42.4 % (ref 36.0–46.0)
Hemoglobin: 12.7 g/dL (ref 12.0–15.0)
MCH: 27.4 pg (ref 26.0–34.0)
MCHC: 30 g/dL (ref 30.0–36.0)
MCV: 91.6 fL (ref 80.0–100.0)
Platelets: 234 10*3/uL (ref 150–400)
RBC: 4.63 MIL/uL (ref 3.87–5.11)
RDW: 14.3 % (ref 11.5–15.5)
WBC: 5.8 10*3/uL (ref 4.0–10.5)
nRBC: 0 % (ref 0.0–0.2)

## 2021-12-28 LAB — TYPE AND SCREEN
ABO/RH(D): O POS
Antibody Screen: NEGATIVE

## 2021-12-28 NOTE — Progress Notes (Addendum)
PCP - [name] NP Cardiologist - Shaune Pollack MD  PPM/ICD - denies Device Orders -  Rep Notified -   Chest x-ray -  EKG - 04/01/21 Stress Test - 03/03/20 ECHO - 11/29/09 Cardiac Cath - none  Sleep Study - 03/04/20 CPAP - yes  Fasting Blood Sugar - na Checks Blood Sugar _____ times a day  Blood Thinner Instructions:na Aspirin Instructions:na  ERAS Protcol -clear liquids until 0530 PRE-SURGERY Ensure or G2- no  COVID TEST- yes 12/28/21-complex ENT   Anesthesia review: yes-cardiac history-paroxysmal tachycardia  Patient denies shortness of breath, fever, cough and chest pain at PAT appointment   All instructions explained to the patient, with a verbal understanding of the material. Patient agrees to go over the instructions while at home for a better understanding. Patient also instructed to wear a mask when out in public after being tested for COVID-19. The opportunity to ask questions was provided.

## 2021-12-29 LAB — SARS CORONAVIRUS 2 (TAT 6-24 HRS): SARS Coronavirus 2: NEGATIVE

## 2021-12-29 NOTE — Anesthesia Preprocedure Evaluation (Addendum)
Anesthesia Evaluation  Patient identified by MRN, date of birth, ID band Patient awake    Reviewed: Allergy & Precautions, NPO status , Patient's Chart, lab work & pertinent test results  History of Anesthesia Complications Negative for: history of anesthetic complications  Airway Mallampati: II  TM Distance: >3 FB Neck ROM: Full    Dental  (+) Dental Advisory Given, Teeth Intact   Pulmonary asthma , sleep apnea and Continuous Positive Airway Pressure Ventilation , COPD,  COPD inhaler,    breath sounds clear to auscultation       Cardiovascular hypertension,  Rhythm:Regular Rate:Normal  '21 ECHO: EF 68%, normal LVF, normal RVF, Grade 1 DD, mild MR  '21 Stress: EF 78%, normal perfusion   Neuro/Psych  Headaches, 4cm sellar mass: pituitary adenoma    GI/Hepatic Neg liver ROS, GERD  Controlled,  Endo/Other  Morbid obesityPituitary adenoma prolactinoma  Renal/GU negative Renal ROS     Musculoskeletal  (+) Arthritis ,   Abdominal (+) + obese,   Peds  Hematology negative hematology ROS (+)   Anesthesia Other Findings   Reproductive/Obstetrics                           Anesthesia Physical Anesthesia Plan  ASA: 3  Anesthesia Plan: General   Post-op Pain Management: Tylenol PO (pre-op)*   Induction: Intravenous  PONV Risk Score and Plan: 3 and Ondansetron, Dexamethasone and Treatment may vary due to age or medical condition  Airway Management Planned: Oral ETT  Additional Equipment: Arterial line  Intra-op Plan:   Post-operative Plan: Extubation in OR  Informed Consent: I have reviewed the patients History and Physical, chart, labs and discussed the procedure including the risks, benefits and alternatives for the proposed anesthesia with the patient or authorized representative who has indicated his/her understanding and acceptance.     Dental advisory given  Plan Discussed with:  CRNA and Surgeon  Anesthesia Plan Comments: (PAT note written 12/29/2021 by Myra Gianotti, PA-C. )      Anesthesia Quick Evaluation

## 2021-12-29 NOTE — Progress Notes (Addendum)
Anesthesia Chart Review:  Case: [id number] Date/Time: 01/04/22 0815   Procedures:      Endonasal endoscopic resection of pituitary tumor - RM 20     ENDOSCOPIC SINUS SURGERY TRANSNASAL PITUITARY REMOVAL WITH NASAL NASOSETTAL FLAP   Anesthesia type: General   Pre-op diagnosis: Pituitary adenoma   Location: [facility] OR ROOM 21 / [facility]   Surgeons: Judith Part, MD; Skotnicki, Meghan A, DO       DISCUSSION: Patient is a 46 year old female scheduled for the above procedure. Known hyperprolactinemia since 2013 diagnosed by GYN, and unclear if had imaging at that time although notes indicate a diagnosis of prolactinoma. Notes also suggest that promethazine for nausea due to chronic pancreatitis may have also contributed. She has been managed on Parlodel. She has been followed by endocrinologist Dr. Loanne Drilling since 01/2018. At 10/21/21 follow-up she reported recent eye exam showing bilateral inferior visual field defect, so brain MRI ordered showing a very large 4 cm sellar/supraseller mass, likely pituitary macroadenoma. She was referred urgently to neurosurgery and then to ENT with the above surgery recommended.   Other history includes never smoker, HTN, HLD, OSA (uses CPAP), asthma, class 3 severe obesity, paroxsymal tachycardia (post COVID 2021), hyperprolactinemia (diagnosed ~ 2013, treated with Parlodel), COVID-19 PNA (11/2019), chronic pancreatitis (on Creon), cholecystectomy.  She has had routine follow-up with her cardiologist and pulmonologist within the past year. Notes reviewed in [facility].   She had a routine negative COVID-19 test on 12/28/21. Anesthesia team to evaluate on the day of surgery. She has an IUD and urine pregnancy test on the day of surgery planned.    VS: BP 119/75   Pulse 83   Temp 36.6 C (Oral)   Resp 17   Ht '5\' 9"'$  (1.753 m)   Wt 128.8 kg   SpO2 100%   BMI 41.94 kg/m    PROVIDERS: [name], NP is PCP Renato Shin, MD is endocrinologist Baltazar Apo, MD is  pulmonologist. Last visit 06/22/21 with one year follow-up recommended.  Rex Kras, DO is cardiologist. Last visit 04/01/21 with one year follow-up planned. Jamse Arn, MD is GYN   LABS: Labs reviewed: Acceptable for surgery. LFTs normal 12/16/21. Normal Thyroid Panel 11/15/21. (all labs ordered are listed, but only abnormal results are displayed)  Labs Reviewed  SARS CORONAVIRUS 2 (TAT 6-24 HRS)  BASIC METABOLIC PANEL  CBC  TYPE AND SCREEN    OTHER: PFTs 03/22/20: FVC 2.68 (74%), post 2.78 (76%). FEV1 2.37 (80%), post 2.57 (87%). DLCO unc 20.60 (80%), cor 19.90 (78%). No obstruction. No bronchodilator responsiveness. Mild restriction. Normal diffusion capacity.  Sleep Study 02/29/20: IMPRESSIONS - Moderate obstructive sleep apnea with an overall AHI of 19.3 and SpO2 low of 80%.  She had a significant REM effect to her sleep apnea. - The patient snored with loud snoring volume.    RECOMMENDATIONS - Additional therapies include weight loss, CPAP therapy, oral appliance, or surgical assessment.    IMAGES: CT Paranasal Sinuses 12/09/21 ([facility]): IMPRESSION:  1. 4.1 cm sellar/suprasellar mass more fully delineated on the prior  brain MRI of 11/12/2021. As before, the mass remodels/erodes  portions of the sella turcica and extends into the bilateral  sphenoid sinuses.  2. Mild mucosal thickening within the frontoethmoidal recesses and  ethmoid air cells, bilaterally. The bilateral frontal sinus drainage  pathways are partially opacified.  3. Mild mucosal thickening versus small mucous retention cyst along  the floor of the right maxillary sinus. The right maxillary sinus  ostium  is obstructed by mucosal thickening and/or secretions.  4. Mild leftward deviation of the nasal septum, inferiorly, with  slight leftward bony spurring.  5. Mild mucosal thickening within the bilateral nasal passages.   MRI Brain 11/12/21: IMPRESSION: Very large 4.1 x 3.4 by 4.1 cm  sellar/suprasellar mass with significant mass effect on the optic chiasm, cavernous sinus invasion, and extension into the sphenoid sinus, probably a pituitary macroadenoma. Recommend neurosurgical consultation.  CT Chest 01/13/20: IMPRESSION: - Right paratracheal mass represents azygos continuation of the inferior vena cava, a normal anatomic variant. - Involving changes of COVID-19 pneumonia with extensive bibasilar reticulation.    EKG: 04/01/2021: Normal sinus rhythm, 64 bpm, without underlying ischemia or injury pattern.   CV: Lexiscan Sestamibi stress test 03/03/2020: Lexiscan nuclear stress test performed using 1-day protocol. Normal myocardial perfusion. Stress LVEF 78%. Low risk study.   Echocardiogram 03/12/2020:  Left ventricle cavity is normal in size and wall thickness. Normal global  wall motion. Normal LV systolic function with EF 68%. Normal diastolic  filling pattern.  Mild (Grade I) mitral regurgitation.  Mild tricuspid regurgitation.  No evidence of pulmonary hypertension.  7 day extended Holter monitor 02/19/20-02/26/20: Dominant rhythm normal sinus. Heart rate 52-139 bpm.  Average heart rate 83 bpm. No atrial fibrillation, supraventricular tachycardia, ventricular tachycardia, high grade AV block, sinus pause greater than or equal to 3 seconds in duration. Total ventricular ectopic burden <1%. Total supraventricular ectopic burden <1%. Patient triggered events: 1.  Underlying rhythm sinus tachycardia without dysrhythmias.    Past Medical History:  Diagnosis Date   Asthma    Chronic pancreatitis ([place])    DDD (degenerative disc disease), lumbosacral    DJD (degenerative joint disease)    Headache    Hyperlipidemia    Hypertension    Hypoglycemia    Obesity    Osteoarthritis    Prolactinoma ([place])    Sciatica    Sleep apnea     Past Surgical History:  Procedure Laterality Date   CHOLECYSTECTOMY     COLONOSCOPY      MEDICATIONS:  Ascorbic Acid  (VITAMIN C) 1000 MG tablet   baclofen (LIORESAL) 10 MG tablet   benzonatate (TESSALON) 200 MG capsule   bromocriptine (PARLODEL) 5 MG capsule   Butenafine HCl (MENTAX) 1 % cream   Cholecalciferol (VITAMIN D3 PO)   Docusate Sodium (COLACE PO)   EPINEPHrine 0.3 mg/0.3 mL IJ SOAJ injection   fenofibrate (TRICOR) 145 MG tablet   fluticasone (FLONASE) 50 MCG/ACT nasal spray   levonorgestrel (MIRENA) 20 MCG/24HR IUD   Menthol, Topical Analgesic, (BIOFREEZE) 10 % CREA   NON FORMULARY   omega-3 fish oil (MAXEPA) 1000 MG CAPS capsule   Pancrelipase, Lip-Prot-Amyl, (CREON) 24000-76000 units CPEP   pantoprazole (PROTONIX) 20 MG tablet   psyllium (REGULOID) 0.52 g capsule   rosuvastatin (CRESTOR) 40 MG tablet   Trospium Chloride 60 MG CP24   Zinc Sulfate (ZINC 15 PO)   No current facility-administered medications for this encounter.    Myra Gianotti, PA-C Surgical Short Stay/Anesthesiology [facility] Phone [phone] [facility] Phone [phone] 12/29/2021 4:13 PM

## 2021-12-30 ENCOUNTER — Other Ambulatory Visit (INDEPENDENT_AMBULATORY_CARE_PROVIDER_SITE_OTHER): Payer: Self-pay | Admitting: Primary Care

## 2021-12-30 MED ORDER — OMEGA-3 FISH OIL 1000 MG PO CAPS
1.0000 | ORAL_CAPSULE | Freq: Two times a day (BID) | ORAL | 1 refills | Status: DC
Start: 2021-12-30 — End: 2022-06-22

## 2021-12-30 MED ORDER — FENOFIBRATE 48 MG PO TABS: 48.0000 mg | ORAL_TABLET | Freq: Every day | ORAL | 1 refills | Status: DC

## 2021-12-31 DIAGNOSIS — G4733 Obstructive sleep apnea (adult) (pediatric): Secondary | ICD-10-CM | POA: Diagnosis not present

## 2022-01-03 ENCOUNTER — Encounter (HOSPITAL_COMMUNITY): Payer: Self-pay | Admitting: Neurological Surgery

## 2022-01-04 ENCOUNTER — Other Ambulatory Visit: Payer: Self-pay

## 2022-01-04 ENCOUNTER — Inpatient Hospital Stay (HOSPITAL_COMMUNITY): Payer: BC Managed Care – PPO | Admitting: Vascular Surgery

## 2022-01-04 ENCOUNTER — Inpatient Hospital Stay (HOSPITAL_COMMUNITY)
Admission: RE | Disposition: A | Discharge status: Home / Self Care | Payer: Self-pay | Source: Home / Self Care | Attending: Neurological Surgery

## 2022-01-04 ENCOUNTER — Inpatient Hospital Stay (HOSPITAL_COMMUNITY)
Admission: RE | Admit: 2022-01-04 | Discharge: 2022-01-13 | DRG: 614 | Disposition: A | Discharge status: Home / Self Care | Payer: BC Managed Care – PPO | Attending: Neurological Surgery | Admitting: Neurological Surgery

## 2022-01-04 ENCOUNTER — Encounter (HOSPITAL_COMMUNITY): Payer: Self-pay | Admitting: Neurological Surgery

## 2022-01-04 ENCOUNTER — Inpatient Hospital Stay (HOSPITAL_COMMUNITY): Payer: BC Managed Care – PPO

## 2022-01-04 DIAGNOSIS — Z8249 Family history of ischemic heart disease and other diseases of the circulatory system: Secondary | ICD-10-CM | POA: Diagnosis not present

## 2022-01-04 DIAGNOSIS — J45909 Unspecified asthma, uncomplicated: Secondary | ICD-10-CM | POA: Diagnosis present

## 2022-01-04 DIAGNOSIS — Z833 Family history of diabetes mellitus: Secondary | ICD-10-CM | POA: Diagnosis not present

## 2022-01-04 DIAGNOSIS — G4730 Sleep apnea, unspecified: Secondary | ICD-10-CM | POA: Diagnosis not present

## 2022-01-04 DIAGNOSIS — Z6841 Body Mass Index (BMI) 40.0 and over, adult: Secondary | ICD-10-CM

## 2022-01-04 DIAGNOSIS — D352 Benign neoplasm of pituitary gland: Principal | ICD-10-CM | POA: Diagnosis present

## 2022-01-04 DIAGNOSIS — J449 Chronic obstructive pulmonary disease, unspecified: Secondary | ICD-10-CM | POA: Diagnosis not present

## 2022-01-04 DIAGNOSIS — G9608 Other cranial cerebrospinal fluid leak: Secondary | ICD-10-CM | POA: Diagnosis not present

## 2022-01-04 DIAGNOSIS — I10 Essential (primary) hypertension: Secondary | ICD-10-CM | POA: Diagnosis present

## 2022-01-04 DIAGNOSIS — E221 Hyperprolactinemia: Secondary | ICD-10-CM | POA: Diagnosis not present

## 2022-01-04 DIAGNOSIS — Z825 Family history of asthma and other chronic lower respiratory diseases: Secondary | ICD-10-CM

## 2022-01-04 DIAGNOSIS — K5900 Constipation, unspecified: Secondary | ICD-10-CM | POA: Diagnosis not present

## 2022-01-04 DIAGNOSIS — Z83438 Family history of other disorder of lipoprotein metabolism and other lipidemia: Secondary | ICD-10-CM

## 2022-01-04 DIAGNOSIS — E785 Hyperlipidemia, unspecified: Secondary | ICD-10-CM | POA: Diagnosis present

## 2022-01-04 DIAGNOSIS — Z01818 Encounter for other preprocedural examination: Secondary | ICD-10-CM

## 2022-01-04 DIAGNOSIS — M1990 Unspecified osteoarthritis, unspecified site: Secondary | ICD-10-CM | POA: Diagnosis not present

## 2022-01-04 DIAGNOSIS — E893 Postprocedural hypopituitarism: Principal | ICD-10-CM

## 2022-01-04 DIAGNOSIS — E232 Diabetes insipidus: Secondary | ICD-10-CM | POA: Diagnosis not present

## 2022-01-04 DIAGNOSIS — E669 Obesity, unspecified: Secondary | ICD-10-CM | POA: Diagnosis present

## 2022-01-04 HISTORY — PX: NASAL SINUS SURGERY: SHX719

## 2022-01-04 HISTORY — PX: CRANIOTOMY: SHX93

## 2022-01-04 LAB — CBC
HCT: 35.2 % — ABNORMAL LOW (ref 36.0–46.0)
Hemoglobin: 11.9 g/dL — ABNORMAL LOW (ref 12.0–15.0)
MCH: 27.7 pg (ref 26.0–34.0)
MCHC: 33.8 g/dL (ref 30.0–36.0)
MCV: 82.1 fL (ref 80.0–100.0)
Platelets: 294 10*3/uL (ref 150–400)
RBC: 4.29 MIL/uL (ref 3.87–5.11)
RDW: 14.2 % (ref 11.5–15.5)
WBC: 6.8 10*3/uL (ref 4.0–10.5)
nRBC: 0 % (ref 0.0–0.2)

## 2022-01-04 LAB — POCT PREGNANCY, URINE: Preg Test, Ur: NEGATIVE

## 2022-01-04 LAB — CREATININE, SERUM
Creatinine, Ser: 0.99 mg/dL (ref 0.44–1.00)
GFR, Estimated: >60 mL/min (ref >60)

## 2022-01-04 SURGERY — CRANIOTOMY HYPOPHYSECTOMY TRANSNASAL APPROACH
Anesthesia: General

## 2022-01-04 MED ORDER — PROPOFOL 10 MG/ML IV BOLUS
INTRAVENOUS | Status: AC
  Filled 2022-01-04: qty 20

## 2022-01-04 MED ORDER — MIDAZOLAM HCL 2 MG/2ML IJ SOLN
INTRAMUSCULAR | Status: AC
  Filled 2022-01-04: qty 2

## 2022-01-04 MED ORDER — ONDANSETRON HCL 4 MG PO TABS: 4.0000 mg | ORAL_TABLET | ORAL | Status: DC | PRN

## 2022-01-04 MED ORDER — LABETALOL HCL 5 MG/ML IV SOLN
10.0000 mg | INTRAVENOUS | Status: DC | PRN
  Administered 2022-01-04: 20 mg via INTRAVENOUS
  Filled 2022-01-04 (×2): qty 4

## 2022-01-04 MED ORDER — DOCUSATE SODIUM 100 MG PO CAPS
100.0000 mg | ORAL_CAPSULE | Freq: Two times a day (BID) | ORAL | Status: DC
  Administered 2022-01-05 – 2022-01-13 (×17): 100 mg via ORAL
  Filled 2022-01-04 (×18): qty 1

## 2022-01-04 MED ORDER — 0.9 % SODIUM CHLORIDE (POUR BTL) OPTIME
TOPICAL | Status: DC | PRN
  Administered 2022-01-04: 1000 mL

## 2022-01-04 MED ORDER — OXYMETAZOLINE HCL 0.05 % NA SOLN
NASAL | Status: AC
  Filled 2022-01-04: qty 30

## 2022-01-04 MED ORDER — TRIAMCINOLONE ACETONIDE 40 MG/ML IJ SUSP
INTRAMUSCULAR | Status: AC
  Filled 2022-01-04: qty 5

## 2022-01-04 MED ORDER — DEXAMETHASONE SODIUM PHOSPHATE 10 MG/ML IJ SOLN
INTRAMUSCULAR | Status: AC
  Filled 2022-01-04: qty 1

## 2022-01-04 MED ORDER — SODIUM CHLORIDE 0.9 % IR SOLN
Status: DC | PRN
  Administered 2022-01-04: 2000 mL

## 2022-01-04 MED ORDER — ONDANSETRON HCL 4 MG/2ML IJ SOLN
INTRAMUSCULAR | Status: AC
  Filled 2022-01-04: qty 2

## 2022-01-04 MED ORDER — SODIUM CHLORIDE 0.9 % IV SOLN
0.0500 ug/kg/min | INTRAVENOUS | Status: AC
  Filled 2022-01-04: qty 5000

## 2022-01-04 MED ORDER — CEFAZOLIN SODIUM-DEXTROSE 2-4 GM/100ML-% IV SOLN
2.0000 g | Freq: Three times a day (TID) | INTRAVENOUS | Status: AC
  Administered 2022-01-04 – 2022-01-05 (×2): 2 g via INTRAVENOUS
  Filled 2022-01-04 (×2): qty 100

## 2022-01-04 MED ORDER — SODIUM CHLORIDE 0.9 % IV SOLN: INTRAVENOUS | Status: DC | PRN

## 2022-01-04 MED ORDER — ACETAMINOPHEN 325 MG PO TABS
650.0000 mg | ORAL_TABLET | ORAL | Status: DC | PRN
  Administered 2022-01-08 – 2022-01-12 (×5): 650 mg via ORAL
  Filled 2022-01-04 (×5): qty 2

## 2022-01-04 MED ORDER — POLYETHYLENE GLYCOL 3350 17 G PO PACK
17.0000 g | PACK | Freq: Every day | ORAL | Status: DC | PRN
  Administered 2022-01-08 – 2022-01-11 (×4): 17 g via ORAL
  Filled 2022-01-04 (×5): qty 1

## 2022-01-04 MED ORDER — TROSPIUM CHLORIDE ER 60 MG PO CP24: 1.0000 | ORAL_CAPSULE | Freq: Every day | ORAL | Status: DC | PRN

## 2022-01-04 MED ORDER — LACTATED RINGERS IV SOLN: INTRAVENOUS | Status: DC

## 2022-01-04 MED ORDER — HEMOSTATIC AGENTS (NO CHARGE) OPTIME
TOPICAL | Status: DC | PRN
  Administered 2022-01-04: 1 via TOPICAL

## 2022-01-04 MED ORDER — MEPERIDINE HCL 25 MG/ML IJ SOLN: 6.2500 mg | INTRAMUSCULAR | Status: DC | PRN

## 2022-01-04 MED ORDER — OXYCODONE HCL 5 MG/5ML PO SOLN: 5.0000 mg | Freq: Once | ORAL | Status: DC | PRN

## 2022-01-04 MED ORDER — CEFAZOLIN IN SODIUM CHLORIDE 3-0.9 GM/100ML-% IV SOLN
INTRAVENOUS | Status: AC
  Filled 2022-01-04: qty 100

## 2022-01-04 MED ORDER — BUTENAFINE HCL 1 % EX CREA: 1.0000 "application " | TOPICAL_CREAM | Freq: Two times a day (BID) | CUTANEOUS | Status: DC | PRN

## 2022-01-04 MED ORDER — BACLOFEN 5 MG HALF TABLET: 5.0000 mg | ORAL_TABLET | Freq: Three times a day (TID) | ORAL | Status: DC | PRN

## 2022-01-04 MED ORDER — HEPARIN SODIUM (PORCINE) 5000 UNIT/ML IJ SOLN
5000.0000 [IU] | Freq: Three times a day (TID) | INTRAMUSCULAR | Status: DC
  Administered 2022-01-06 – 2022-01-13 (×22): 5000 [IU] via SUBCUTANEOUS
  Filled 2022-01-04 (×22): qty 1

## 2022-01-04 MED ORDER — THROMBIN (RECOMBINANT) 5000 UNITS EX SOLR
CUTANEOUS | Status: DC | PRN
  Administered 2022-01-04: 10 mL via TOPICAL

## 2022-01-04 MED ORDER — THROMBIN 5000 UNITS EX SOLR
OROMUCOSAL | Status: DC | PRN
  Administered 2022-01-04: 5 mL via TOPICAL

## 2022-01-04 MED ORDER — CHLORHEXIDINE GLUCONATE CLOTH 2 % EX PADS
6.0000 | MEDICATED_PAD | Freq: Every day | CUTANEOUS | Status: DC
  Administered 2022-01-04 – 2022-01-12 (×8): 6 via TOPICAL

## 2022-01-04 MED ORDER — LABETALOL HCL 5 MG/ML IV SOLN
INTRAVENOUS | Status: AC
  Filled 2022-01-04: qty 4

## 2022-01-04 MED ORDER — HYDROMORPHONE HCL 1 MG/ML IJ SOLN
0.5000 mg | INTRAMUSCULAR | Status: DC | PRN
  Administered 2022-01-04 – 2022-01-11 (×9): 0.5 mg via INTRAVENOUS
  Filled 2022-01-04 (×9): qty 1

## 2022-01-04 MED ORDER — OXYCODONE HCL 5 MG PO TABS: 5.0000 mg | ORAL_TABLET | Freq: Once | ORAL | Status: DC | PRN

## 2022-01-04 MED ORDER — PHENYLEPHRINE 80 MCG/ML (10ML) SYRINGE FOR IV PUSH (FOR BLOOD PRESSURE SUPPORT)
PREFILLED_SYRINGE | INTRAVENOUS | Status: DC | PRN
  Administered 2022-01-04: 160 ug via INTRAVENOUS
  Administered 2022-01-04: 80 ug via INTRAVENOUS

## 2022-01-04 MED ORDER — OXYMETAZOLINE HCL 0.05 % NA SOLN
NASAL | Status: DC | PRN
  Administered 2022-01-04: 2 via NASAL

## 2022-01-04 MED ORDER — ROCURONIUM BROMIDE 10 MG/ML (PF) SYRINGE
PREFILLED_SYRINGE | INTRAVENOUS | Status: AC
Start: 2022-01-04 — End: ?
  Filled 2022-01-04: qty 10

## 2022-01-04 MED ORDER — MUPIROCIN 2 % EX OINT
TOPICAL_OINTMENT | CUTANEOUS | Status: AC
  Filled 2022-01-04: qty 22

## 2022-01-04 MED ORDER — LIDOCAINE-EPINEPHRINE 1 %-1:100000 IJ SOLN
INTRAMUSCULAR | Status: AC
Start: 2022-01-04 — End: ?
  Filled 2022-01-04: qty 1

## 2022-01-04 MED ORDER — DEXAMETHASONE SODIUM PHOSPHATE 10 MG/ML IJ SOLN
INTRAMUSCULAR | Status: DC | PRN
  Administered 2022-01-04: 10 mg via INTRAVENOUS

## 2022-01-04 MED ORDER — ROCURONIUM BROMIDE 10 MG/ML (PF) SYRINGE
PREFILLED_SYRINGE | INTRAVENOUS | Status: DC | PRN
  Administered 2022-01-04: 30 mg via INTRAVENOUS
  Administered 2022-01-04: 10 mg via INTRAVENOUS
  Administered 2022-01-04: 70 mg via INTRAVENOUS
  Administered 2022-01-04: 10 mg via INTRAVENOUS
  Administered 2022-01-04: 30 mg via INTRAVENOUS

## 2022-01-04 MED ORDER — FENTANYL CITRATE (PF) 250 MCG/5ML IJ SOLN
INTRAMUSCULAR | Status: AC
  Filled 2022-01-04: qty 5

## 2022-01-04 MED ORDER — PSYLLIUM 0.52 G PO CAPS: 0.5200 g | ORAL_CAPSULE | Freq: Every day | ORAL | Status: DC

## 2022-01-04 MED ORDER — HYDROMORPHONE HCL 1 MG/ML IJ SOLN: 0.2500 mg | INTRAMUSCULAR | Status: DC | PRN

## 2022-01-04 MED ORDER — CEFAZOLIN IN SODIUM CHLORIDE 3-0.9 GM/100ML-% IV SOLN
3.0000 g | INTRAVENOUS | Status: AC
  Administered 2022-01-04 (×2): 3 g via INTRAVENOUS

## 2022-01-04 MED ORDER — SODIUM CHLORIDE 0.9 % IV SOLN
0.1500 ug/kg/min | INTRAVENOUS | Status: AC
  Administered 2022-01-04: .15 ug/kg/min via INTRAVENOUS
  Filled 2022-01-04: qty 2000

## 2022-01-04 MED ORDER — ROSUVASTATIN CALCIUM 20 MG PO TABS
40.0000 mg | ORAL_TABLET | Freq: Every day | ORAL | Status: DC
  Administered 2022-01-04 – 2022-01-13 (×10): 40 mg via ORAL
  Filled 2022-01-04 (×11): qty 2

## 2022-01-04 MED ORDER — PROPOFOL 10 MG/ML IV BOLUS
INTRAVENOUS | Status: DC | PRN
  Administered 2022-01-04: 120 mg via INTRAVENOUS
  Administered 2022-01-04: 50 mg via INTRAVENOUS

## 2022-01-04 MED ORDER — CHLORHEXIDINE GLUCONATE 0.12 % MT SOLN: 15.0000 mL | Freq: Once | OROMUCOSAL | Status: AC

## 2022-01-04 MED ORDER — CHLORHEXIDINE GLUCONATE CLOTH 2 % EX PADS: 6.0000 | MEDICATED_PAD | Freq: Once | CUTANEOUS | Status: DC

## 2022-01-04 MED ORDER — PHENYLEPHRINE HCL-NACL 20-0.9 MG/250ML-% IV SOLN
INTRAVENOUS | Status: DC | PRN
  Administered 2022-01-04: 40 ug/min via INTRAVENOUS

## 2022-01-04 MED ORDER — FENTANYL CITRATE (PF) 250 MCG/5ML IJ SOLN
INTRAMUSCULAR | Status: DC | PRN
  Administered 2022-01-04: 250 ug via INTRAVENOUS

## 2022-01-04 MED ORDER — HYDROCODONE-ACETAMINOPHEN 5-325 MG PO TABS
1.0000 | ORAL_TABLET | ORAL | Status: DC | PRN
  Administered 2022-01-04 – 2022-01-13 (×29): 1 via ORAL
  Filled 2022-01-04 (×30): qty 1

## 2022-01-04 MED ORDER — PANCRELIPASE (LIP-PROT-AMYL) 12000-38000 UNITS PO CPEP
72000.0000 [IU] | ORAL_CAPSULE | Freq: Three times a day (TID) | ORAL | Status: DC
  Administered 2022-01-05 – 2022-01-13 (×26): 72000 [IU] via ORAL
  Filled 2022-01-04 (×8): qty 2
  Filled 2022-01-04: qty 6
  Filled 2022-01-04: qty 2
  Filled 2022-01-04: qty 6
  Filled 2022-01-04 (×7): qty 2
  Filled 2022-01-04: qty 6
  Filled 2022-01-04 (×8): qty 2

## 2022-01-04 MED ORDER — CHLORHEXIDINE GLUCONATE 0.12 % MT SOLN
OROMUCOSAL | Status: AC
  Administered 2022-01-04: 15 mL via OROMUCOSAL
  Filled 2022-01-04: qty 15

## 2022-01-04 MED ORDER — LIDOCAINE-EPINEPHRINE 1 %-1:100000 IJ SOLN
INTRAMUSCULAR | Status: DC | PRN
  Administered 2022-01-04: 10 mL

## 2022-01-04 MED ORDER — ROCURONIUM BROMIDE 10 MG/ML (PF) SYRINGE
PREFILLED_SYRINGE | INTRAVENOUS | Status: AC
  Filled 2022-01-04: qty 10

## 2022-01-04 MED ORDER — PROMETHAZINE HCL 25 MG/ML IJ SOLN: 6.2500 mg | INTRAMUSCULAR | Status: DC | PRN

## 2022-01-04 MED ORDER — SUGAMMADEX SODIUM 200 MG/2ML IV SOLN
INTRAVENOUS | Status: DC | PRN
  Administered 2022-01-04: 400 mg via INTRAVENOUS

## 2022-01-04 MED ORDER — PANTOPRAZOLE SODIUM 20 MG PO TBEC
20.0000 mg | DELAYED_RELEASE_TABLET | Freq: Every day | ORAL | Status: DC
  Administered 2022-01-04 – 2022-01-13 (×10): 20 mg via ORAL
  Filled 2022-01-04 (×10): qty 1

## 2022-01-04 MED ORDER — CEFAZOLIN SODIUM 1 G IJ SOLR
INTRAMUSCULAR | Status: AC
  Filled 2022-01-04: qty 10

## 2022-01-04 MED ORDER — ORAL CARE MOUTH RINSE: 15.0000 mL | Freq: Once | OROMUCOSAL | Status: AC

## 2022-01-04 MED ORDER — PROMETHAZINE HCL 25 MG PO TABS: 12.5000 mg | ORAL_TABLET | ORAL | Status: DC | PRN

## 2022-01-04 MED ORDER — FAMOTIDINE IN NACL 20-0.9 MG/50ML-% IV SOLN
20.0000 mg | Freq: Two times a day (BID) | INTRAVENOUS | Status: DC
Start: 2022-01-04 — End: 2022-01-05
  Administered 2022-01-04 – 2022-01-05 (×2): 20 mg via INTRAVENOUS
  Filled 2022-01-04 (×3): qty 50

## 2022-01-04 MED ORDER — ACETAMINOPHEN 650 MG RE SUPP: 650.0000 mg | RECTAL | Status: DC | PRN

## 2022-01-04 MED ORDER — MIDAZOLAM HCL 2 MG/2ML IJ SOLN
INTRAMUSCULAR | Status: DC | PRN
  Administered 2022-01-04: 1 mg via INTRAVENOUS

## 2022-01-04 MED ORDER — BROMOCRIPTINE MESYLATE 2.5 MG PO TABS
5.0000 mg | ORAL_TABLET | Freq: Every day | ORAL | Status: DC
  Administered 2022-01-05 – 2022-01-11 (×7): 2.5 mg via ORAL
  Administered 2022-01-12 – 2022-01-13 (×2): 5 mg via ORAL
  Filled 2022-01-04 (×9): qty 2

## 2022-01-04 MED ORDER — ONDANSETRON HCL 4 MG/2ML IJ SOLN
4.0000 mg | INTRAMUSCULAR | Status: DC | PRN
  Administered 2022-01-04 – 2022-01-09 (×3): 4 mg via INTRAVENOUS
  Filled 2022-01-04 (×3): qty 2

## 2022-01-04 MED ORDER — ONDANSETRON HCL 4 MG/2ML IJ SOLN
INTRAMUSCULAR | Status: DC | PRN
  Administered 2022-01-04 (×2): 4 mg via INTRAVENOUS

## 2022-01-04 MED ORDER — LABETALOL HCL 5 MG/ML IV SOLN
INTRAVENOUS | Status: DC | PRN
  Administered 2022-01-04 (×2): 5 mg via INTRAVENOUS

## 2022-01-04 MED ORDER — ACETAMINOPHEN 500 MG PO TABS: 1000.0000 mg | ORAL_TABLET | Freq: Once | ORAL | Status: AC

## 2022-01-04 MED ORDER — OXYMETAZOLINE HCL 0.05 % NA SOLN
NASAL | Status: DC | PRN
  Administered 2022-01-04: 1 via TOPICAL

## 2022-01-04 MED ORDER — MIDAZOLAM HCL 2 MG/2ML IJ SOLN: 0.5000 mg | Freq: Once | INTRAMUSCULAR | Status: DC | PRN

## 2022-01-04 MED ORDER — THROMBIN 5000 UNITS EX SOLR
CUTANEOUS | Status: AC
  Filled 2022-01-04: qty 15000

## 2022-01-04 MED ORDER — ACETAMINOPHEN 500 MG PO TABS
ORAL_TABLET | ORAL | Status: AC
  Administered 2022-01-04: 1000 mg via ORAL
  Filled 2022-01-04: qty 2

## 2022-01-04 SURGICAL SUPPLY — 111 items
APPLICATOR ARISTA FLEXITIP XL (MISCELLANEOUS) ×1 IMPLANT
ATTRACTOMAT 16X20 MAGNETIC DRP (DRAPES) ×1 IMPLANT
BAG COUNTER SPONGE SURGICOUNT (BAG) ×3 IMPLANT
BAND RUBBER #18 3X1/16 STRL (MISCELLANEOUS) ×2 IMPLANT
BENZOIN TINCTURE PRP APPL 2/3 (GAUZE/BANDAGES/DRESSINGS) ×2 IMPLANT
BLADE RAD40 ROTATE 4M 4 5PK (BLADE) IMPLANT
BLADE RAD60 ROTATE M4 4 5PK (BLADE) IMPLANT
BLADE ROTATE TRICUT 4X13 M4 (BLADE) ×1 IMPLANT
BLADE SURG 10 STRL SS (BLADE) ×2 IMPLANT
BLADE SURG 11 STRL SS (BLADE) ×3 IMPLANT
BLADE SURG 15 STRL LF DISP TIS (BLADE) ×2 IMPLANT
BLADE SURG 15 STRL SS (BLADE) ×2
BLADE TRICUT ROTATE M4 4 5PK (BLADE) ×1 IMPLANT
BUR TAPER CHOANAL ATRESIA 30K (BURR) ×1 IMPLANT
CABLE BIPOLOR RESECTION CORD (MISCELLANEOUS) ×2 IMPLANT
CANISTER SUC SOCK COL 7 IN (MISCELLANEOUS) IMPLANT
CANISTER SUCT 3000ML PPV (MISCELLANEOUS) ×4 IMPLANT
CATH LUMBAR HERMETIC 14G (CATHETERS) ×1 IMPLANT
CATHETER LUMBAR HERMETIC 14G (CATHETERS)
COAGULATOR SUCT SWTCH 10FR 6 (ELECTROSURGICAL) IMPLANT
COVER BACK TABLE 60X90IN (DRAPES) IMPLANT
COVER LIGHT HANDLE STERIS (MISCELLANEOUS) IMPLANT
COVER MAYO STAND STRL (DRAPES) ×3 IMPLANT
DRAPE HALF SHEET 40X57 (DRAPES) ×4 IMPLANT
DRAPE INCISE IOBAN 66X45 STRL (DRAPES) ×2 IMPLANT
DRAPE MICROSCOPE LEICA (MISCELLANEOUS) IMPLANT
DRAPE SURG 17X23 STRL (DRAPES) ×5 IMPLANT
DRESSING MEROCEL 8CM (GAUZE/BANDAGES/DRESSINGS) IMPLANT
DRESSING NASAL POPE 10X1.5X2.5 (GAUZE/BANDAGES/DRESSINGS) IMPLANT
DRSG MEROCEL 8CM (GAUZE/BANDAGES/DRESSINGS)
DRSG NASAL POPE 10X1.5X2.5 (GAUZE/BANDAGES/DRESSINGS)
DRSG NASOPORE 8CM (GAUZE/BANDAGES/DRESSINGS) IMPLANT
DRSG TELFA 3X8 NADH (GAUZE/BANDAGES/DRESSINGS) IMPLANT
DURAPREP 26ML APPLICATOR (WOUND CARE) ×2 IMPLANT
ELECT NDL TIP 2.8 STRL (NEEDLE) ×1 IMPLANT
ELECT NEEDLE TIP 2.8 STRL (NEEDLE) ×2 IMPLANT
ELECT REM PT RETURN 9FT ADLT (ELECTROSURGICAL) ×2
ELECTRODE NDL INSULATED 6.5 (ELECTROSURGICAL) ×1 IMPLANT
ELECTRODE REM PT RTRN 9FT ADLT (ELECTROSURGICAL) ×1 IMPLANT
GAUZE PACKING FOLDED 2  STR (GAUZE/BANDAGES/DRESSINGS) ×1
GAUZE PACKING FOLDED 2 STR (GAUZE/BANDAGES/DRESSINGS) ×1 IMPLANT
GAUZE SPONGE 2X2 8PLY STRL LF (GAUZE/BANDAGES/DRESSINGS) ×1 IMPLANT
GAUZE SPONGE 4X4 12PLY STRL (GAUZE/BANDAGES/DRESSINGS) ×2 IMPLANT
GLOVE BIO SURGEON STRL SZ 6.5 (GLOVE) ×2 IMPLANT
GLOVE BIO SURGEON STRL SZ7.5 (GLOVE) ×2 IMPLANT
GLOVE BIOGEL PI IND STRL 7.5 (GLOVE) ×1 IMPLANT
GLOVE BIOGEL PI INDICATOR 7.5 (GLOVE) ×1
GLOVE ECLIPSE 7.5 STRL STRAW (GLOVE) ×2 IMPLANT
GLOVE EXAM NITRILE LRG STRL (GLOVE) IMPLANT
GLOVE EXAM NITRILE XL STR (GLOVE) IMPLANT
GLOVE EXAM NITRILE XS STR PU (GLOVE) IMPLANT
GLOVE SURG LTX SZ7.5 (GLOVE) ×2 IMPLANT
GOWN STRL REUS W/ TWL LRG LVL3 (GOWN DISPOSABLE) ×2 IMPLANT
GOWN STRL REUS W/ TWL XL LVL3 (GOWN DISPOSABLE) IMPLANT
GOWN STRL REUS W/TWL 2XL LVL3 (GOWN DISPOSABLE) ×2 IMPLANT
GOWN STRL REUS W/TWL LRG LVL3 (GOWN DISPOSABLE) ×2
GOWN STRL REUS W/TWL XL LVL3 (GOWN DISPOSABLE)
HEMOSTAT ARISTA ABSORB 3G PWDR (HEMOSTASIS) ×1 IMPLANT
HEMOSTAT POWDER KIT SURGIFOAM (HEMOSTASIS) ×2 IMPLANT
HEMOSTAT SURGICEL 2X14 (HEMOSTASIS) IMPLANT
KIT BASIN OR (CUSTOM PROCEDURE TRAY) ×3 IMPLANT
KIT DRAIN CSF ACCUDRAIN (MISCELLANEOUS) ×1 IMPLANT
KIT TURNOVER KIT B (KITS) ×3 IMPLANT
KNIFE ARACHNOID DISP AM-21-S (BLADE) IMPLANT
NDL HYPO 18GX1.5 BLUNT FILL (NEEDLE) ×1 IMPLANT
NDL HYPO 25GX1X1/2 BEV (NEEDLE) IMPLANT
NDL HYPO 25X1 1.5 SAFETY (NEEDLE) ×1 IMPLANT
NDL PRECISIONGLIDE 27X1.5 (NEEDLE) ×1 IMPLANT
NDL SPNL 22GX3.5 QUINCKE BK (NEEDLE) ×1 IMPLANT
NDL SPNL 25GX3.5 QUINCKE BL (NEEDLE) ×1 IMPLANT
NEEDLE HYPO 18GX1.5 BLUNT FILL (NEEDLE) ×2 IMPLANT
NEEDLE HYPO 25GX1X1/2 BEV (NEEDLE) IMPLANT
NEEDLE HYPO 25X1 1.5 SAFETY (NEEDLE) ×2 IMPLANT
NEEDLE PRECISIONGLIDE 27X1.5 (NEEDLE) ×2 IMPLANT
NEEDLE SPNL 22GX3.5 QUINCKE BK (NEEDLE) ×2 IMPLANT
NEEDLE SPNL 25GX3.5 QUINCKE BL (NEEDLE) ×2 IMPLANT
NS IRRIG 1000ML POUR BTL (IV SOLUTION) ×3 IMPLANT
PAD ARMBOARD 7.5X6 YLW CONV (MISCELLANEOUS) ×6 IMPLANT
PAD DRESSING TELFA 3X8 NADH (GAUZE/BANDAGES/DRESSINGS) IMPLANT
PATTIES SURGICAL .25X.25 (GAUZE/BANDAGES/DRESSINGS) IMPLANT
PATTIES SURGICAL .5 X.5 (GAUZE/BANDAGES/DRESSINGS) ×2 IMPLANT
PATTIES SURGICAL .5 X3 (DISPOSABLE) ×4 IMPLANT
PENCIL BUTTON HOLSTER BLD 10FT (ELECTRODE) ×3 IMPLANT
POSITIONER HEAD DONUT 9IN (MISCELLANEOUS) IMPLANT
PROBE FOR NEUROSURGERY (MISCELLANEOUS) IMPLANT
SEALANT ADHERUS EXTEND TIP (MISCELLANEOUS) ×1 IMPLANT
SHEATH ENDOSCRUB 0 DEG (SHEATH) ×1 IMPLANT
SHEATH ENDOSCRUB 30 DEG (SHEATH) IMPLANT
SHEATH ENDOSCRUB 45 DEG (SHEATH) IMPLANT
SOL ANTI FOG 6CC (MISCELLANEOUS) ×1 IMPLANT
SOLUTION ANTI FOG 6CC (MISCELLANEOUS) ×1
SPLINT NASAL POSISEP X .6X2 (GAUZE/BANDAGES/DRESSINGS) ×1 IMPLANT
SPONGE GAUZE 2X2 STER 10/PKG (GAUZE/BANDAGES/DRESSINGS) ×1
SPONGE SURGIFOAM ABS GEL SZ50 (HEMOSTASIS) ×1 IMPLANT
SPONGE T-LAP 4X18 ~~LOC~~+RFID (SPONGE) ×1 IMPLANT
STAPLER SKIN PROX WIDE 3.9 (STAPLE) ×1 IMPLANT
STRIP CLOSURE SKIN 1/2X4 (GAUZE/BANDAGES/DRESSINGS) ×2 IMPLANT
SUT BONE WAX W31G (SUTURE) ×2 IMPLANT
SUT ETHILON 3 0 PS 1 (SUTURE) IMPLANT
SWAB COLLECTION DEVICE MRSA (MISCELLANEOUS) IMPLANT
SYR 50ML SLIP (SYRINGE) IMPLANT
SYR CONTROL 10ML LL (SYRINGE) ×2 IMPLANT
TOWEL GREEN STERILE (TOWEL DISPOSABLE) ×2 IMPLANT
TOWEL GREEN STERILE FF (TOWEL DISPOSABLE) ×4 IMPLANT
TRACKER ENT INSTRUMENT (MISCELLANEOUS) ×2 IMPLANT
TRACKER ENT PATIENT (MISCELLANEOUS) ×1 IMPLANT
TRAY ENT MC OR (CUSTOM PROCEDURE TRAY) ×3 IMPLANT
TRAY FOLEY MTR SLVR 16FR STAT (SET/KITS/TRAYS/PACK) ×2 IMPLANT
TUBE CONNECTING 12X1/4 (SUCTIONS) ×2 IMPLANT
TUBING STRAIGHTSHOT EPS 5PK (TUBING) ×1 IMPLANT
WATER STERILE IRR 1000ML POUR (IV SOLUTION) ×3 IMPLANT

## 2022-01-04 NOTE — H&P (Signed)
Surgical H&P Update  HPI: 46 y.o. with a history of mild hyperprolactinemia. Workup diagnosed a very large sellar mass with optic chiasm compression, PRL elevation was minimal and inconsistent with prolactinoma, as well as no response to DA agonists. No changes in health or vision since they were last seen. Still having the above and wishes to proceed with surgery.  PMHx:  Past Medical History:  Diagnosis Date   Asthma    Chronic pancreatitis ([place])    DDD (degenerative disc disease), lumbosacral    DJD (degenerative joint disease)    Headache    Hyperlipidemia    Hypertension    Hypoglycemia    Obesity    Osteoarthritis    Prolactinoma ([place])    Sciatica    Sleep apnea    FamHx:  Family History  Problem Relation Age of Onset   Other Paternal 53        pituitary surgery   Hypertension Mother    Diabetes Mother    Colon cancer Mother    Arthritis Mother    Thyroid disease Mother    Asthma Mother    Diabetes Father    Hypertension Father    Bell's palsy Father    SocHx:  reports that she has never smoked. She has never been exposed to tobacco smoke. She has never used smokeless tobacco. She reports that she does not drink alcohol and does not use drugs.  Physical Exam: Aox3, PERRL, VF with +bitemporal hemianopsia, FS & SS, strength 5/5x4  Assesment/Plan: 46 y.o. woman with large pituitary adenoma and visual field defects, here for endoscopic endonasal resection. Risks, benefits, and alternatives discussed and the patient would like to continue with surgery.  -OR today -4N ICU post-op  Judith Part, MD 01/04/22 8:23 AM

## 2022-01-04 NOTE — Op Note (Signed)
PATIENT: Olivea Piscitelli  DAY OF SURGERY: 01/04/22   PRE-OPERATIVE DIAGNOSIS:  Pituitary adenoma   POST-OPERATIVE DIAGNOSIS:  Pituitary adenoma   PROCEDURE:  Endonasal endoscopic transphenoidal resection of pituitary tumor   SURGEON:  Surgeon(s) and Role:    Judith Part, MD - Co-surgeon    Skotnicki, Meghan, MD - Co-surgeon   ANESTHESIA: ETGA   BRIEF HISTORY: This is a 46 year old woman who had previously been on dopamine agonist therapy without a formal diagnosis of a pituitary adenoma, she had visual loss and was diagnosed with a large pituitary adenoma. I therefore recommended resection. This was discussed with the patient as well as risks, benefits, and alternatives and wished to proceed with surgical treatment.   OPERATIVE DETAIL: Dr. Fredric Dine performed the nasal portion of the approach and is dictated separately.   For the neurosurgical portion of the case, Dr. Fredric Dine and I used a combination of visual anatomic landmarks and frameless stereotaxy to confirm orientation and anatomy. The dura of the sella was then opened sharply. Tumor was immediately encountered and samples were taken and sent to pathology for analysis. The tumor was, as expected, quite large and unfortunately did have a somewhat more fibrous aspect to it, presumably 2/2 the DA agonist therapy she had been on for years. With Dr. Dorathy Kinsman assistance while holding the endoscope, the tumor was then resected in a deliberate fashion with bimanual technique using a combination of ring curettes, suction, and penfield #4. It was dissected laterally, then posteriorly. Given the size and consistency, this proved quite difficult and took some time to do safely. Ringed curettes were used to remove the tumor to allow the diaphragma to descend into the field. At the end of resection, there was pulsatile diaphragma in the field. There was no evidence of CSF leakage. There was an impressively large skull base defect due to the  size of the tumor. Dr. Fredric Dine had already prepared a flap and the reconstruction was excellent after NSF placement followed by Adheris. Dr. Fredric Dine then took over to complete the remainder fo the reconstruction portion of the procedure.   EBL:  172m   DRAINS: none   SPECIMENS: Pituitary tumor   TJudith Part MD 01/04/22 2:00 PM

## 2022-01-04 NOTE — Transfer of Care (Signed)
Immediate Anesthesia Transfer of Care Note  Patient: Beth [name]  Procedure(s) Performed: Endonasal endoscopic resection of pituitary tumor ENDOSCOPIC SINUS SURGERY TRANSNASAL PITUITARY REMOVAL WITH NASAL NASOSETTAL FLAP  Patient Location: PACU  Anesthesia Type:General  Level of Consciousness: awake, drowsy and patient cooperative  Airway & Oxygen Therapy: Patient Spontanous Breathing and Patient connected to face mask oxygen  Post-op Assessment: Report given to RN, Post -op Vital signs reviewed and stable and Patient moving all extremities X 4  Post vital signs: Reviewed and stable  Last Vitals:  Vitals Value Taken Time  BP 128/63 01/04/22 1418  Temp    Pulse 83 01/04/22 1426  Resp 16 01/04/22 1426  SpO2 100 % 01/04/22 1426  Vitals shown include unvalidated device data.  Last Pain:  Vitals:   01/04/22 0707  TempSrc:   PainSc: 0-No pain         Complications: No notable events documented.

## 2022-01-04 NOTE — Anesthesia Postprocedure Evaluation (Signed)
Anesthesia Post Note  Patient: Beth [name]  Procedure(s) Performed: Endonasal endoscopic resection of pituitary tumor ENDOSCOPIC SINUS SURGERY TRANSNASAL PITUITARY REMOVAL WITH NASAL NASOSETTAL FLAP     Patient location during evaluation: PACU Anesthesia Type: General Level of consciousness: awake and alert, patient cooperative and oriented Pain management: pain level controlled Vital Signs Assessment: post-procedure vital signs reviewed and stable Respiratory status: spontaneous breathing, nonlabored ventilation and respiratory function stable Cardiovascular status: blood pressure returned to baseline and stable Postop Assessment: no apparent nausea or vomiting Anesthetic complications: no   No notable events documented.  Last Vitals:  Vitals:   01/04/22 1445 01/04/22 1600  BP: 131/79 114/69  Pulse: 74 77  Resp: 17 15  Temp:    SpO2: 95% 99%                   Carlis Burnsworth,E. Ulysses Alper

## 2022-01-04 NOTE — Progress Notes (Signed)
Alerted NP Viona Gilmore that patient had small amount of drainage from nose.  Orders were given to continue to monitor.

## 2022-01-04 NOTE — Anesthesia Procedure Notes (Signed)
Procedure Name: Intubation Date/Time: 01/04/2022 9:00 AM  Performed by: Inda Coke, CRNAPre-anesthesia Checklist: Patient identified, Emergency Drugs available, Suction available, Timeout performed and Patient being monitored Patient Re-evaluated:Patient Re-evaluated prior to induction Oxygen Delivery Method: Circle system utilized Preoxygenation: Pre-oxygenation with 100% oxygen Induction Type: IV induction Ventilation: Mask ventilation without difficulty Laryngoscope Size: Mac and 3 Grade View: Grade I Tube type: Oral Tube size: 7.0 mm Airway Equipment and Method: Stylet Placement Confirmation: ETT inserted through vocal cords under direct vision, positive ETCO2, CO2 detector and breath sounds checked- equal and bilateral Secured at: 22 cm Tube secured with: Tape Dental Injury: Teeth and Oropharynx as per pre-operative assessment

## 2022-01-04 NOTE — Anesthesia Procedure Notes (Signed)
Arterial Line Insertion Start/End7/05/2022 8:05 AM, 01/04/2022 8:25 AM Performed by: Lance Coon, CRNA, CRNA  Patient location: Pre-op. Preanesthetic checklist: patient identified, IV checked, site marked, risks and benefits discussed, surgical consent, monitors and equipment checked, pre-op evaluation, timeout performed and anesthesia consent Lidocaine 1% used for infiltration Left, radial was placed Catheter size: 20 G Hand hygiene performed  and maximum sterile barriers used  Allen's test indicative of satisfactory collateral circulation Attempts: 3 Procedure performed using ultrasound guided technique. Ultrasound Notes:anatomy identified, needle tip was noted to be adjacent to the nerve/plexus identified and no ultrasound evidence of intravascular and/or intraneural injection Following insertion, dressing applied and Biopatch. Post procedure assessment: normal  Patient tolerated the procedure well with no immediate complications.

## 2022-01-04 NOTE — Progress Notes (Signed)
  Transition of Care [facility]) Screening Note   Patient Details  Name: Beth [name] Date of Birth: [birth year]   Transition of Care [facility]) CM/SW Contact:    Ella Bodo, RN Phone Number: 01/04/2022, 4:37 PM    Transition of Care Department [facility]) has reviewed patient and no TOC needs have been identified at this time. We will continue to monitor patient advancement through interdisciplinary progression rounds. If new patient transition needs arise, please place a TOC consult.  Reinaldo Raddle, RN, BSN  Trauma/Neuro ICU Case Manager [phone]

## 2022-01-04 NOTE — Op Note (Signed)
OPERATIVE NOTE  Beth [name] Date/Time of Admission: 01/04/2022  6:30 AM  CSN: [id number];WNU:[id number] Attending Provider: Judith Part, MD Room/Bed: MCPO/NONE DOB: [birth year] Age: 45 y.o.   Pre-Op Diagnosis: Pituitary adenoma  Post-Op Diagnosis: Pituitary adenoma  Procedure: Procedure(s): Endonasal endoscopic resection of pituitary tumor ENDOSCOPIC SINUS SURGERY WITH TRANSPHENOIDAL PITUITARY REMOVAL WITH NASOSEPTAL FLAP  Anesthesia: General  Surgeon(s): Bryne Lindon A Lorisa Scheid, DO Emelda Brothers, MD  Staff: Circulator: Rozell Searing, RN; Deary, Ivin Booty, RN; Leda Gauze, RN Scrub Person: Georg Ruddle T Vendor Representative : Janene Harvey  Implants: * No implants in log *  Specimens: ID Type Source Tests Collected by Time Destination  1 : pituitary tumor Tissue PATH Other SURGICAL PATHOLOGY Judith Part, MD [id number]     Complications: None  EBL: 50 ML  Condition: stable  Operative Findings:  Pituitary macroadenoma  Description of Operation: Once operative consent was obtained and the site and surgery were confirmed with the patient and the operating room team, the patient was brought back to the operating room and general endotracheal anesthesia was obtained. The patient was then turned over to the ENT service, at which time the image-guided system was attached and noted to be in good calibration. Lidocaine 1% with 1:100,000 epinephrine was injected into the nasal septum bilaterally, inferior turbinates bilaterally, the middle turbinates bilaterally, and the axilla between the medial turbinate and the lateral nasal wall. Afrin-soaked pledgets were placed into the nasal cavity, and the patient was prepped and draped in sterile fashion. Attention was first turned to the right nasal vestibule.  The inferior and middle turbinate were gently lateralized using a Cottle elevator until the superior turbinate was  identified.  This was also lateralized until the natural sinus ostium was appreciated.  This was serially dilated using a mushroom punch and an up-biting Kerrison.  A posterior septectomy was then performed using a combination of a Cottle elevator, Tru-Cut forceps and Kerrisons.  A right-sided nasal septal flap was then delineated using electrocautery and elevated using the suction elevator.  Once it was completely freed from the underlying septum, it was gently tucked into the nasopharynx. The left middle turbinate and superior turbinate were then identified and gently lateralized until the natural sphenoid os was identified and serially dilated using the sphenoid dilator, mushroom punch and up-biting Kerrisons.  With the bilateral sphenoid cavities in view, the remaining bone of the sphenoid face was removed using Kerrison forceps.  A diamond bur drill was also used to complete the bony resection and to remove the intrasinus septum until the bone overlying the pituitary adenoma was completely visualized.  This bone was removed using Kerrison forceps until the tumor was completely visualized. Dr. Zada Finders then scrubbed in and completely resected the tumor, please see his operative note for details regarding the repair.  There was no evidence of CSF leak following tumor removal. The nasal septal flap was then retrieved from the nasopharynx and rotated to cover the defect completely.  This was reinforced with Surgicel and Adheris was used to completely cover the flap and Surgicel.  Posisep nasal packing was placed in bilateral nasal cavities in the sphenoid defect. An orogastric tube was placed and the stomach cavity was suctioned to reduce postoperative nausea. The patient was turned over to anesthesia service and was extubated in the operating room and transferred to the PACU in stable condition.   Jason Coop, [place] [facility]  01/04/2022

## 2022-01-04 NOTE — Progress Notes (Signed)
Patient arrived to 4N19 with no possessions.  When asked patient stated that her daughters had all of her possessions.

## 2022-01-05 ENCOUNTER — Encounter (HOSPITAL_COMMUNITY): Payer: Self-pay | Admitting: Neurological Surgery

## 2022-01-05 LAB — RENAL FUNCTION PANEL
Albumin: 3.3 g/dL — ABNORMAL LOW (ref 3.5–5.0)
Anion gap: 6 (ref 5–15)
BUN: 9 mg/dL (ref 6–20)
CO2: 22 mmol/L (ref 22–32)
Calcium: 8.8 mg/dL — ABNORMAL LOW (ref 8.9–10.3)
Chloride: 119 mmol/L — ABNORMAL HIGH (ref 98–111)
Creatinine, Ser: 1.24 mg/dL — ABNORMAL HIGH (ref 0.44–1.00)
GFR, Estimated: 55 mL/min — ABNORMAL LOW (ref >60)
Glucose, Bld: 182 mg/dL — ABNORMAL HIGH (ref 70–99)
Phosphorus: 1.3 mg/dL — ABNORMAL LOW (ref 2.5–4.6)
Potassium: 3.4 mmol/L — ABNORMAL LOW (ref 3.5–5.1)
Sodium: 147 mmol/L — ABNORMAL HIGH (ref 135–145)

## 2022-01-05 LAB — CBC
HCT: 34.2 % — ABNORMAL LOW (ref 36.0–46.0)
Hemoglobin: 11.4 g/dL — ABNORMAL LOW (ref 12.0–15.0)
MCH: 27.6 pg (ref 26.0–34.0)
MCHC: 33.3 g/dL (ref 30.0–36.0)
MCV: 82.8 fL (ref 80.0–100.0)
Platelets: 306 10*3/uL (ref 150–400)
RBC: 4.13 MIL/uL (ref 3.87–5.11)
RDW: 14.6 % (ref 11.5–15.5)
WBC: 11.8 10*3/uL — ABNORMAL HIGH (ref 4.0–10.5)
nRBC: 0 % (ref 0.0–0.2)

## 2022-01-05 LAB — SODIUM
Sodium: 149 mmol/L — ABNORMAL HIGH (ref 135–145)
Sodium: 142 mmol/L (ref 135–145)

## 2022-01-05 LAB — BASIC METABOLIC PANEL
Anion gap: 8 (ref 5–15)
BUN: 9 mg/dL (ref 6–20)
CO2: 23 mmol/L (ref 22–32)
Calcium: 9 mg/dL (ref 8.9–10.3)
Chloride: 116 mmol/L — ABNORMAL HIGH (ref 98–111)
Creatinine, Ser: 1.1 mg/dL — ABNORMAL HIGH (ref 0.44–1.00)
GFR, Estimated: >60 mL/min (ref >60)
Glucose, Bld: 143 mg/dL — ABNORMAL HIGH (ref 70–99)
Potassium: 3.7 mmol/L (ref 3.5–5.1)
Sodium: 147 mmol/L — ABNORMAL HIGH (ref 135–145)

## 2022-01-05 LAB — SODIUM, URINE, RANDOM: Sodium, Ur: <10 mmol/L

## 2022-01-05 LAB — CREATININE, URINE, RANDOM: Creatinine, Urine: 38 mg/dL

## 2022-01-05 MED ORDER — SODIUM CHLORIDE 0.45 % IV BOLUS
1000.0000 mL | Freq: Once | INTRAVENOUS | Status: AC
  Administered 2022-01-05: 1000 mL via INTRAVENOUS

## 2022-01-05 MED ORDER — DESMOPRESSIN ACETATE 4 MCG/ML IJ SOLN
0.5000 ug | Freq: Two times a day (BID) | INTRAMUSCULAR | Status: DC
Start: 2022-01-05 — End: 2022-01-09
  Administered 2022-01-05 – 2022-01-08 (×7): 0.52 ug via INTRAVENOUS
  Filled 2022-01-05 (×6): qty 1

## 2022-01-05 MED ORDER — SODIUM CHLORIDE 0.45 % IV SOLN: INTRAVENOUS | Status: DC

## 2022-01-05 MED ORDER — ORAL CARE MOUTH RINSE
15.0000 mL | OROMUCOSAL | Status: DC | PRN
Start: 2022-01-05 — End: 2022-01-13

## 2022-01-05 MED ORDER — OXYMETAZOLINE HCL 0.05 % NA SOLN: 1.0000 | Freq: Two times a day (BID) | NASAL | Status: AC | PRN

## 2022-01-05 MED ORDER — CEFAZOLIN SODIUM-DEXTROSE 2-4 GM/100ML-% IV SOLN
2.0000 g | Freq: Three times a day (TID) | INTRAVENOUS | Status: DC
Start: 2022-01-05 — End: 2022-01-13
  Administered 2022-01-05 – 2022-01-13 (×23): 2 g via INTRAVENOUS
  Filled 2022-01-05 (×23): qty 100

## 2022-01-05 MED ORDER — DESMOPRESSIN ACETATE 4 MCG/ML IJ SOLN
1.0000 ug | Freq: Two times a day (BID) | INTRAMUSCULAR | Status: DC
  Administered 2022-01-05: 1 ug via INTRAVENOUS
  Filled 2022-01-05 (×2): qty 1

## 2022-01-05 MED ORDER — SALINE SPRAY 0.65 % NA SOLN
2.0000 | NASAL | Status: DC
  Administered 2022-01-05 – 2022-01-13 (×34): 2 via NASAL
  Filled 2022-01-05: qty 44

## 2022-01-05 MED ORDER — CEFAZOLIN IN SODIUM CHLORIDE 3-0.9 GM/100ML-% IV SOLN: 3.0000 g | INTRAVENOUS | Status: DC

## 2022-01-05 MED FILL — Thrombin For Soln 5000 Unit: CUTANEOUS | Qty: 2 | Status: AC

## 2022-01-05 NOTE — Progress Notes (Signed)
Neurosurgery Service Progress Note  Subjective: No acute events overnight, +polyuria, +polydypsia, some red-tinged drainage overnight from the nares. But notably worse when she sits forward or bears down, +halo clearing when I tested it, no headaches, actually feels overall quite good, vision is subjectively improved from preop  Objective: Vitals:   01/05/22 0500 01/05/22 0600 01/05/22 0700 01/05/22 0800  BP: (!) 124/58 124/61 (!) 112/58 121/62  Pulse: 61 66 68 64  Resp: (!) 23 (!) 22 (!) 22 (!) 24  Temp:    98.5 F (36.9 C)  TempSrc:    Oral  SpO2: 94% 94% 91% 96%  Weight:      Height:        Physical Exam: Aox3, PERRL, EOMI, FS, visual fields with some bitemporal hemianopsia on confrontation, minimal occasional nasal drainage when leaning forwards  Assessment & Plan: 46 y.o. woman s/p endonasal endoscopic rsxn of pituitary tumor, post-op DI, some nasal drainage that is concerning for an occult CSF leak as none was seen in the OR.  -DI: start DDAVP, q6h Na until stabilizes, started 1/2 NS to replete as she's 4L neg, some soft diastolics but no tachycardia and asymptomatic -get urine/serum labs and spec grav to confirm the diagnosis, , thirst mechanism intact, encouraged her to drink, vitals stable, no sign of other deficiencies at this time -keep in the unit -will see if the nasal drainage persits, if so then may require lumbar drain placement tomorrow   Judith Part  01/05/22 10:21 AM

## 2022-01-05 NOTE — Progress Notes (Signed)
*   DDAVP 1 mcg IV BID for acute neurogenic DI   * Baseline Na: 147  * Na after DDAVP: 149 > 142, 47mq drop within ~6 hrs   Plan: Discussed with Dr. EEllene Route reduce DDAVP to 0.5 mcg IV BID for tonight and continue to monitor Na levels  Lilyanah Celestin D. DMina Marble PharmD, BCPS, BDooling7/13/2023, 10:26 PM

## 2022-01-05 NOTE — Progress Notes (Signed)
PT Cancellation Note  Patient Details Name: Beth [name] MRN: [id number] DOB: 1976/01/16   Cancelled Treatment:    Reason Eval/Treat Not Completed: Medical issues which prohibited therapy (nasal drainage with csf leak, hold per RN/MD)   Beth [name] 01/05/2022, 12:52 PM Beth [name], [place] Office: [phone]

## 2022-01-05 NOTE — Progress Notes (Signed)
Patient began to have moderate drainage from nose when OT got her up from the bed to the chair.  Alerted Dr. Zada Finders and was ordered to cancel PT.  Dressing was changed on nose to control drainage.

## 2022-01-05 NOTE — Evaluation (Signed)
Occupational Therapy Evaluation Patient Details Name: Beth [name] MRN: [id number] DOB: 1975-11-28 Today's Date: 01/05/2022   History of Present Illness 46 y.o. female admitted 7/12 for endoscopic transnasal pituitary tumor removal. 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 States her adult child can assist at [employer].  Once pt stood to transfer into chair, noted increased drainage from nose. Sat in chair and reclined head, drainage stopped and nsg notified. No increase in headache/dizziness. Pt states vision is "better" but not at baseline. Given B lower peripheral field cut, recommend follow up with her eye doctor for a full field visual assessment. It pt continues to have reduced vision, recommend follow up with OT at a neuro outpt center to address compensatory strategies to increase independence with IADL tasks and facilitate return to work. Pt verbalized understanding.      Recommendations for follow up therapy are one component of a multi-disciplinary discharge planning process, led by the attending physician.  Recommendations may be updated based on patient status, additional functional criteria and insurance authorization.   Follow Up Recommendations  Outpatient OT (pending improvement in vision)    Assistance Recommended at Discharge Intermittent Supervision/Assistance  Patient can return home with the following Assistance with cooking/housework;Assist for transportation    Functional Status Assessment  Patient has had a recent decline in their functional status and demonstrates the ability to make significant improvements in function in a reasonable and predictable amount of time.  Equipment Recommendations  None recommended by OT    Recommendations for Other  Services       Precautions / Restrictions Precautions Precaution Comments: CSF leak      Mobility Bed Mobility                    Transfers Overall transfer level: Needs assistance Equipment used: Rolling walker (2 wheels) Transfers: Sit to/from Stand, Bed to chair/wheelchair/BSC Sit to Stand: Min guard Stand pivot transfers: Min guard                Balance Overall balance assessment: Needs assistance   Sitting balance-Leahy Scale: Good       Standing balance-Leahy Scale: Fair                             ADL either performed or assessed with clinical judgement   ADL Overall ADL's : Needs assistance/impaired                                     Functional mobility during ADLs: Min guard General ADL Comments: ASSISTANCE REQUIRED DUE TO IMMOBILITY OF BEING ABLET O MOVE HEAD WITHOUT DICOMFORT; csf LEAK; WILL FURTHER ASSESS     Vision Baseline Vision/History: 1 Wears glasses Vision Assessment?: Vision impaired- to be further tested in functional context;Yes Eye Alignment: Within Functional Limits Ocular Range of Motion: Within Functional Limits Alignment/Gaze Preference: Within Defined Limits Tracking/Visual Pursuits: Able to track stimulus in all quads without difficulty Saccades: Within functional limits Visual Fields: Other (comment) (?slow to identify targets, especially in lower field) Additional Comments: reports vision has improved since surgery; "blurred" at times; not at baseline; is using her phone and ipad; complains  of "eye fatigue" adn eyes "jumping"     Perception Perception Comments: wfl   Praxis      Pertinent Vitals/Pain Pain Assessment Pain Assessment: 0-10 Pain Score: 1  Pain Location: behind eyes Pain Descriptors / Indicators: Discomfort Pain Intervention(s): Limited activity within patient's tolerance     Hand Dominance Right   Extremity/Trunk Assessment Upper Extremity Assessment Upper  Extremity Assessment: Overall WFL for tasks assessed   Lower Extremity Assessment Lower Extremity Assessment: Defer to PT evaluation   Cervical / Trunk Assessment Cervical / Trunk Assessment: Normal   Communication Communication Communication: No difficulties (decreased hearing R ear)   Cognition Arousal/Alertness: Awake/alert Behavior During Therapy: WFL for tasks assessed/performed Overall Cognitive Status: Within Functional Limits for tasks assessed                                       General Comments       Exercises     Shoulder Instructions      Home Living Family/patient expects to be discharged to:: Private residence Living Arrangements: Children (107 yo who can help as needed) Available Help at Discharge: Family;Available PRN/intermittently Type of Home: Apartment Home Access: Stairs to enter Entrance Stairs-Number of Steps: 3 flights Entrance Stairs-Rails: Left Home Layout: One level     Bathroom Shower/Tub: Teacher, early years/pre: Standard Bathroom Accessibility: No   Home Equipment: Rollator (4 wheels);Cane - single point;Shower seat;Grab bars - tub/shower;BSC/3in1 (collapsible)          Prior Functioning/Environment Prior Level of Function : Independent/Modified Independent             Mobility Comments: has been using AD since having Covid in 2021; 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 Problem List: Decreased strength;Decreased activity tolerance;Impaired balance (sitting and/or standing);Impaired vision/perception      OT Treatment/Interventions: Self-care/ADL training;Energy conservation;DME and/or AE instruction;Therapeutic activities;Visual/perceptual remediation/compensation;Patient/family education    OT Goals(Current goals can be found in the care plan section) Acute Rehab OT Goals Patient Stated Goal: for vision to improve OT Goal Formulation: With patient Time For Goal Achievement:  01/19/22 Potential to Achieve Goals: Good  OT Frequency: Min 2X/week    Co-evaluation              AM-PAC OT "6 Clicks" Daily Activity     Outcome Measure Help from another person eating meals?: None Help from another person taking care of personal grooming?: A Little Help from another person toileting, which includes using toliet, bedpan, or urinal?: A Little Help from another person bathing (including washing, rinsing, drying)?: A Lot Help from another person to put on and taking off regular upper body clothing?: A Little Help from another person to put on and taking off regular lower body clothing?: A Lot 6 Click Score: 17   End of Session Equipment Utilized During Treatment: Rolling walker (2 wheels) Nurse Communication: Mobility status;Other (comment) (?csf leak)  Activity Tolerance: Patient tolerated treatment well Patient left: in chair;with call bell/phone within reach  OT Visit Diagnosis: Unsteadiness on feet (R26.81);Low vision, both eyes (H54.2)                Time: 1610-9604 OT Time Calculation (min): 34 min Charges:  OT General Charges $OT Visit: 1 Visit OT Evaluation $OT Eval Moderate Complexity: 1 Mod OT Treatments $Self Care/Home Management : 8-22 mins  Deidre Ala  Tarin Johndrow, OT/L   Acute OT Clinical Specialist Acute Rehabilitation Services Pager [phone] Office [phone]   [facility] 01/05/2022, 1:54 PM

## 2022-01-06 LAB — RENAL FUNCTION PANEL
Albumin: 2.8 g/dL — ABNORMAL LOW (ref 3.5–5.0)
Anion gap: 8 (ref 5–15)
BUN: 10 mg/dL (ref 6–20)
CO2: 23 mmol/L (ref 22–32)
Calcium: 7.9 mg/dL — ABNORMAL LOW (ref 8.9–10.3)
Chloride: 109 mmol/L (ref 98–111)
Creatinine, Ser: 0.97 mg/dL (ref 0.44–1.00)
GFR, Estimated: >60 mL/min (ref >60)
Glucose, Bld: 92 mg/dL (ref 70–99)
Phosphorus: 2.1 mg/dL — ABNORMAL LOW (ref 2.5–4.6)
Potassium: 3.2 mmol/L — ABNORMAL LOW (ref 3.5–5.1)
Sodium: 140 mmol/L (ref 135–145)

## 2022-01-06 LAB — SODIUM
Sodium: 141 mmol/L (ref 135–145)
Sodium: 141 mmol/L (ref 135–145)
Sodium: 138 mmol/L (ref 135–145)
Sodium: 145 mmol/L (ref 135–145)

## 2022-01-06 MED ORDER — POTASSIUM CHLORIDE CRYS ER 20 MEQ PO TBCR
40.0000 meq | EXTENDED_RELEASE_TABLET | Freq: Once | ORAL | Status: AC
  Administered 2022-01-06: 40 meq via ORAL
  Filled 2022-01-06: qty 2

## 2022-01-06 MED ORDER — FENTANYL CITRATE PF 50 MCG/ML IJ SOSY
50.0000 ug | PREFILLED_SYRINGE | Freq: Once | INTRAMUSCULAR | Status: AC
  Administered 2022-01-06: 50 ug via INTRAVENOUS
  Filled 2022-01-06 (×2): qty 1

## 2022-01-06 MED ORDER — LIDOCAINE HCL (PF) 1 % IJ SOLN
INTRAMUSCULAR | Status: AC
  Administered 2022-01-06: 5 mL
  Filled 2022-01-06: qty 5

## 2022-01-06 NOTE — Progress Notes (Signed)
Neurosurgery Service Progress Note  Subjective: No acute events overnight, cont'd nasal drainage overnight, some mild headaches this morning, vision stable, polydypsia slowed down, slightly positivev past 24h  Objective: Vitals:   01/06/22 0500 01/06/22 0559 01/06/22 0600 01/06/22 0700  BP: 110/71 117/75 117/75 115/67  Pulse: (!) 53 62 (!) 56 67  Resp: '16 18 17 20  '$ Temp:      TempSrc:      SpO2: 96% 99% 99% 97%  Weight:      Height:        Physical Exam: Aox3, PERRL, EOMI, FS, visual fields with some bitemporal hemianopsia on confrontation, minimal occasional nasal drainage when leaning forwards  Assessment & Plan: 46 y.o. woman s/p endonasal endoscopic rsxn of pituitary tumor, post-op DI, some nasal drainage that is concerning for an occult CSF leak as none was seen in the OR.  -DI: Na normalized, BP better, d/c 1/2NS and see if she can maintain homeostasis with intact thirst mechanism, will repeat the RFP to make sure her Cr comes back to normal, cont DDAVP for now and trend Na, if stable x24h we can go to bid blood draws -drainage much more convincing this morning of CSF, will place a lumbar drain this afternoon, drain x3d, if she continues to leak then take back to explore next week, which I discussed with her and her family this morning. Once in place, drain 10cc/hr -keep in the unit   Judith Part  01/06/22 9:11 AM

## 2022-01-06 NOTE — Procedures (Signed)
PREOP DIAGNOSIS:  1. Anterior skull base CSF Leak   POSTOP DIAGNOSIS: Same  PROCEDURE: Placement of Lumbar Drain  SURGEON: Emelda Brothers, MD  ASSISTANT: None  ANESTHESIA: IV sedation with local  EBL: Minimal  SPECIMENS: none  DRAINS: Lumbar drain  COMPLICATIONS: none  CONDITION: stable  HISTORY: Beth [name] is a 46 y.o. female woman in whom I performed an endonasal endoscopic resection of a very large pituitary tumor. No intra-op evidence of CSF leakage but post-operatively, she developed clear rhinorrhea consistent with a CSF leak. I therefore recommended LD placement.  PROCEDURE IN DETAIL: The patient was positioned in the lateral decubitus position. The area was prepped and draped in a sterile fashion. The intercristal plane and spinous processes were palpated and the localized interspace was injected with local anesthesia in the midline. A 14ga Tuohy needle was inserted and stylet removed with flow of clear CSF. The intrathecal catheter was threaded without resistance and advanced with good flow out of the catheter. It was held in place while the Tuohy needle was removed, a strain loop was created, and it was secured with an adhesive dressing with continued good flow.

## 2022-01-06 NOTE — Progress Notes (Signed)
Upon assessment after shift change, gauze under nose was changed and noticed to have minimal clear to bloody drainage. Around 2230, patient asked to have gauze changed again. The rolled up gauze was soaked thoroughly upon assessment. Dr. Ellene Route was notified about the increase in drainage. Verbal order for pharmacy consult for ancef to be restarted and to keep changing the gauze as it becomes saturated.

## 2022-01-06 NOTE — Progress Notes (Addendum)
Urine output has increased per the patient. There is 600 ml on shift change and within the last hour (per the pt).   Urine specific gravity completed : USG = 1.003  Osterguard to be at bedside soon for lumbar drain procedure.

## 2022-01-07 LAB — SODIUM
Sodium: 144 mmol/L (ref 135–145)
Sodium: 142 mmol/L (ref 135–145)
Sodium: 139 mmol/L (ref 135–145)

## 2022-01-07 LAB — BASIC METABOLIC PANEL
Anion gap: 5 (ref 5–15)
BUN: 11 mg/dL (ref 6–20)
CO2: 27 mmol/L (ref 22–32)
Calcium: 8.8 mg/dL — ABNORMAL LOW (ref 8.9–10.3)
Chloride: 109 mmol/L (ref 98–111)
Creatinine, Ser: 0.9 mg/dL (ref 0.44–1.00)
GFR, Estimated: >60 mL/min (ref >60)
Glucose, Bld: 114 mg/dL — ABNORMAL HIGH (ref 70–99)
Potassium: 4.2 mmol/L (ref 3.5–5.1)
Sodium: 141 mmol/L (ref 135–145)

## 2022-01-07 MED ORDER — SODIUM CHLORIDE 0.9% FLUSH
10.0000 mL | Freq: Two times a day (BID) | INTRAVENOUS | Status: DC
  Administered 2022-01-07: 10 mL
  Administered 2022-01-07: 20 mL
  Administered 2022-01-08 – 2022-01-13 (×11): 10 mL

## 2022-01-07 MED ORDER — SODIUM CHLORIDE 0.9% FLUSH: 10.0000 mL | INTRAVENOUS | Status: DC | PRN

## 2022-01-07 MED ORDER — SIMETHICONE 80 MG PO CHEW
80.0000 mg | CHEWABLE_TABLET | Freq: Four times a day (QID) | ORAL | Status: DC | PRN
Start: 2022-01-07 — End: 2022-01-13
  Administered 2022-01-07 – 2022-01-10 (×5): 80 mg via ORAL
  Filled 2022-01-07 (×5): qty 1

## 2022-01-07 NOTE — Progress Notes (Signed)
Subjective: Patient reports no headache, mild dampness of the bandage, no numbness tingling or weakness, feels like her vision is okay  Objective: Vital signs in last 24 hours: Temp:  [97.9 F (36.6 C)-99.8 F (37.7 C)] 99.8 F (37.7 C) (07/15 0400) Pulse Rate:  [50-82] 55 (07/15 0600) Resp:  [14-28] 15 (07/15 0600) BP: (89-144)/(58-86) 144/86 (07/15 0600) SpO2:  [93 %-99 %] 98 % (07/15 0600)  Intake/Output from previous day: 07/14 0701 - 07/15 0700 In: 905.3 [P.O.:480; I.V.:225.3; IV Piggyback:200] Out: 6915 [Urine:6800; Drains:115] Intake/Output this shift: Total I/O In: -  Out: 912 [Urine:900; Drains:12]  Neurologic: Grossly normal  Lab Results: Lab Results  Component Value Date   WBC 11.8 (H) 01/05/2022   HGB 11.4 (L) 01/05/2022   HCT 34.2 (L) 01/05/2022   MCV 82.8 01/05/2022   PLT 306 01/05/2022   No results found for: "INR", "PROTIME" BMET Lab Results  Component Value Date   NA 141 01/07/2022   K 4.2 01/07/2022   CL 109 01/07/2022   CO2 27 01/07/2022   GLUCOSE 114 (H) 01/07/2022   BUN 11 01/07/2022   CREATININE 0.90 01/07/2022   CALCIUM 8.8 (L) 01/07/2022    Studies/Results: No results found.  Assessment/Plan: Continue lumbar drain for postoperative CSF leak after transfer neurosurgery.  She appears to be doing okay.  Sodium is 141.  Estimated body mass index is 42.09 kg/m as calculated from the following:   Height as of this encounter: '5\' 9"'$  (1.753 m).   Weight as of this encounter: 129.3 kg.    LOS: 3 days    Eustace Moore 01/07/2022, 8:25 AM

## 2022-01-07 NOTE — Progress Notes (Signed)

## 2022-01-08 LAB — SODIUM
Sodium: 140 mmol/L (ref 135–145)
Sodium: 139 mmol/L (ref 135–145)
Sodium: 135 mmol/L (ref 135–145)

## 2022-01-08 NOTE — Progress Notes (Signed)
Subjective: Patient reports mild headaches, drainage down the back of her throat. She does think that her bandage is a little damp today.   Objective: Vital signs in last 24 hours: Temp:  [99 F (37.2 C)-99.2 F (37.3 C)] 99.2 F (37.3 C) (07/15 1600) Pulse Rate:  [52-68] 59 (07/16 0600) Resp:  [15-23] 15 (07/16 0600) BP: (118-139)/(65-92) 126/85 (07/16 0600) SpO2:  [95 %-99 %] 95 % (07/16 0600)  Intake/Output from previous day: 07/15 0701 - 07/16 0700 In: 597.6 [P.O.:480; IV Piggyback:117.6] Out: 4794 [Urine:4575; Drains:219] Intake/Output this shift: No intake/output data recorded.  Neurologic: Grossly normal  Lab Results: Lab Results  Component Value Date   WBC 11.8 (H) 01/05/2022   HGB 11.4 (L) 01/05/2022   HCT 34.2 (L) 01/05/2022   MCV 82.8 01/05/2022   PLT 306 01/05/2022   No results found for: "INR", "PROTIME" BMET Lab Results  Component Value Date   NA 140 01/08/2022   K 4.2 01/07/2022   CL 109 01/07/2022   CO2 27 01/07/2022   GLUCOSE 114 (H) 01/07/2022   BUN 11 01/07/2022   CREATININE 0.90 01/07/2022   CALCIUM 8.8 (L) 01/07/2022    Studies/Results: No results found.  Assessment/Plan: S/p transphenoidal hypophysectomy, doing well, drain putting out about 52m/hr.    LOS: 4 days    KOcie CornfieldMSelect Specialty Hospital - [facility]7/16/2023, 7:56 AM

## 2022-01-09 LAB — TSH: TSH: 0.026 u[IU]/mL — ABNORMAL LOW (ref 0.350–4.500)

## 2022-01-09 LAB — CORTISOL: Cortisol, Plasma: 18.7 ug/dL

## 2022-01-09 LAB — T4, FREE: Free T4: 1 ng/dL (ref 0.61–1.12)

## 2022-01-09 LAB — SODIUM
Sodium: 138 mmol/L (ref 135–145)
Sodium: 141 mmol/L (ref 135–145)
Sodium: 142 mmol/L (ref 135–145)

## 2022-01-09 MED ORDER — BISACODYL 10 MG RE SUPP
10.0000 mg | Freq: Every day | RECTAL | Status: DC | PRN
  Administered 2022-01-09 – 2022-01-11 (×2): 10 mg via RECTAL
  Filled 2022-01-09 (×2): qty 1

## 2022-01-09 NOTE — Progress Notes (Addendum)
Neurosurgery Service Progress Note  Subjective: No acute events overnight, no drainage from the nares yesterday or today, leaned forward this morning with Valsalva without drainage  Objective: Vitals:   01/09/22 0400 01/09/22 0500 01/09/22 0600 01/09/22 0700  BP: 130/84 138/85 135/87 140/73  Pulse: 61 69 63 63  Resp: '20 19 16 19  '$ Temp: 99.1 F (37.3 C)     TempSrc: Axillary     SpO2: 97% 98% 98% 98%  Weight:      Height:        Physical Exam: Aox3, PERRL, EOMI, FS, visual fields stable  Assessment & Plan: 46 y.o. woman s/p endonasal endoscopic rsxn of pituitary tumor, post-op DI, some nasal drainage that is concerning for an occult CSF leak as none was seen in the OR.  -DI: Na creeping down, intact thirst, will d/c DDAVP and see if she can maintain normonatremia -continue LD x1 more day to make sure she has 72h w/o drainage, will clamp tomorrow morning if no drainage overnight -constipation, started miralax, got a suppository just now, if no improvement then enema tomorrow -will add random cortisol thyroid studies to her next Na draw -keep in the unit   Judith Part  01/09/22 7:47 AM

## 2022-01-09 NOTE — Progress Notes (Addendum)
Alerted MD Ostergard that patient was producing 218ms of urine and hour.  MD was also alerted that lumbar drain was not putting out fluid for the past hour and that it had slowed down in previous hours.  RN received verbal order to continue to measure and assess UO.  Orders were given to clamp lumbar drain.  RN will continue to assess and act accordingly.

## 2022-01-10 LAB — SODIUM
Sodium: 141 mmol/L (ref 135–145)
Sodium: 141 mmol/L (ref 135–145)

## 2022-01-10 NOTE — Progress Notes (Signed)
Neurosurgery Service Progress Note  Subjective: No acute events overnight, no drainage from the nares except right after she uses saline spray, drain clamped yesterday at 14:00, sat up with valsalva today without any drainage  Objective: Vitals:   01/10/22 0300 01/10/22 0400 01/10/22 0500 01/10/22 0600  BP: 119/78 118/67  119/80  Pulse: 69 61 63 (!) 57  Resp: 17 15 (!) 21 20  Temp:  98.1 F (36.7 C)    TempSrc:  Oral    SpO2: 97% 97% 99% 100%  Weight:      Height:        Physical Exam: Aox3, PERRL, EOMI, FS, visual fields stable  Assessment & Plan: 46 y.o. woman s/p endonasal endoscopic rsxn of pituitary tumor, post-op DI, started DDAVP with resolution, DDAVP stopped 7/17. CSF leak POD1 s/p LD placement, drained x3d then clamped.   -DI: polyuria yesterday afternoon, Na trending up to but UOP decreased to 0.73m/kg/hr overnight, suspect auto-regulating and diuresing, Na 142, baseline in the past hovers around 138-144 so higher end of normal, hold DDAVP, will restart if Na goes above 145 -LD clamped yesterday, will d/c this afternoon if she continues to be leak-free -constipation, resolved -cortisol / FT4 WNL, TSH low but with normal FT4 no short term concerns and will allow some time  -keep in the unit   TJudith Part 01/10/22 7:57 AM

## 2022-01-10 NOTE — Discharge Summary (Signed)
Discharge Summary  Date of Admission: 01/04/2022  Date of Discharge: 01/13/22  Attending Physician: Emelda Brothers, MD  Hospital Course: Patient was admitted following an uncomplicated endoscopic endonasal transphenoidal resection of a large pituitary tumor. Post-operatively, vision was subjectively improved. On POD1, she was noted to have rhinorrhea consistent with CSF leakage, even though no intra-op leak was observed. She also went into diabetes insipidus on POD1. Her DI was managed with DDAVP and fluids and her DDAVP was able to be discontinued on 7/18. Her CSF leak required bedside lumbar drain placement, which was drained for 3 days with resolution of the leakage, then clamped without return of drainage, LD was then removed on 7/19 after successful clamp trial. Her post-operative course was otherwise uncomplicated and the patient was discharged home with home PT/OT on 01/13/22. They will follow up in clinic with me in clinic in 2 weeks. Final pathology resulted as pituitary adenoma without notable or concerning features.  Neurologic exam at discharge:  Strength 5/5 x4 and SILTx4, visual fields mildly improved from preop  Discharge diagnosis: Pituitary adenoma  Judith Part, MD 01/10/22 8:30 PM

## 2022-01-11 LAB — SODIUM
Sodium: 141 mmol/L (ref 135–145)
Sodium: 141 mmol/L (ref 135–145)

## 2022-01-11 MED ORDER — LIDOCAINE-EPINEPHRINE (PF) 2 %-1:200000 IJ SOLN
INTRAMUSCULAR | Status: AC
  Filled 2022-01-11: qty 20

## 2022-01-11 MED ORDER — LIDOCAINE HCL (PF) 1 % IJ SOLN
INTRAMUSCULAR | Status: AC
  Administered 2022-01-11: 30 mL
  Filled 2022-01-11: qty 30

## 2022-01-11 MED ORDER — LIDOCAINE HCL (PF) 1 % IJ SOLN
INTRAMUSCULAR | Status: DC
Start: 2022-01-11 — End: 2022-01-11
  Filled 2022-01-11: qty 30

## 2022-01-11 NOTE — Progress Notes (Signed)
Neurosurgery Service Progress Note  Subjective: No acute events overnight, no drainage from the nares except some formed clots last night, still feels constipated  Objective: Vitals:   01/11/22 0600 01/11/22 0725 01/11/22 0800 01/11/22 0801  BP: 116/71 125/83 116/68 116/68  Pulse: 61 74 77 72  Resp: '17 17 14 14  '$ Temp:   98.6 F (37 C)   TempSrc:   Axillary   SpO2: 96% 96% 94% 97%  Weight:      Height:        Physical Exam: Aox3, PERRL, EOMI, FS, visual fields stable  Assessment & Plan: 46 y.o. woman s/p endonasal endoscopic rsxn of pituitary tumor, post-op DI, started DDAVP with resolution, DDAVP stopped 7/17. CSF leak POD1 s/p LD placement, drained x3d then clamped.   -DI: off DDAVP with stable Na at 141, in her baseline range, will continue to monitor, DI likely resolved unless triple phasing -LD clamped x48h w/o CSF drainage, d/c'd this morning -will transfer out of the unit today, start PT/OT -d/c foley   Judith Part  01/11/22 8:59 AM

## 2022-01-11 NOTE — Progress Notes (Signed)
After repositioning patient began to have bloody drainage from both nostrils. Patient sat up and a new Gauze dressing was applied to both nares until bleeding stopped. Will continue to monitor.   Donalda Ewings, RN

## 2022-01-12 LAB — SODIUM: Sodium: 140 mmol/L (ref 135–145)

## 2022-01-12 NOTE — Progress Notes (Signed)
Neurosurgery Service Progress Note  Subjective: No acute events overnight, no drainage from the nares, much brighter this morning  Objective: Vitals:   01/12/22 0907 01/12/22 1019 01/12/22 1104 01/12/22 1200  BP: 129/68   110/66  Pulse: 84   86  Resp: '20 20 20 '$ (!) 21  Temp: 97.8 F (36.6 C)   98.8 F (37.1 C)  TempSrc: Oral   Oral  SpO2: 97%   97%  Weight:      Height:        Physical Exam: Aox3, PERRL, EOMI, FS, visual fields stable  Assessment & Plan: 46 y.o. woman s/p endonasal endoscopic rsxn of pituitary tumor, post-op DI, started DDAVP with resolution, DDAVP stopped 7/17. CSF leak POD1 s/p LD placement, drained x3d then clamped.   -DI: off DDAVP and self-regulating well, can go to daily checks -PT/OT recs   Judith Part  01/12/22 2:06 PM

## 2022-01-12 NOTE — Evaluation (Signed)
Physical Therapy Evaluation Patient Details Name: Beth [name] MRN: [id number] DOB: [birth year] Today's Date: 01/12/2022  History of Present Illness  46 y.o. female admitted 7/12 for endoscopic transnasal pituitary tumor removal. 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 Post-op CSF leak and LD placed 7/14 and d/c 7/19.  Clinical Impression  Patient presents with decreased mobility after on bedrest x 5 days.  She normally mobilizes with her cane, but has had to use a rollator a lot since having Covid in 2021.  She was able to mobilize in the room today with the RW and minguard A.  She fatigues quickly and has some pressure on the R and back of her head with upright activity.  She is eager to improve and should progress, though will have to complete stair training as lives on the third floor of her apartment building having 3 flights to enter, though can rest on landings.  PT will continue to follow acutely.        Recommendations for follow up therapy are one component of a multi-disciplinary discharge planning process, led by the attending physician.  Recommendations may be updated based on patient status, additional functional criteria and insurance authorization.  Follow Up Recommendations Home health PT      Assistance Recommended at Discharge Intermittent Supervision/Assistance  Patient can return home with the following  A little help with walking and/or transfers;A little help with bathing/dressing/bathroom;Assist for transportation;Help with stairs or ramp for entrance;Assistance with cooking/housework;Direct supervision/assist for medications management    Equipment Recommendations None recommended by PT  Recommendations for Other Services       Functional Status Assessment Patient has had a recent decline in their functional status and demonstrates the ability to make significant improvements in function in a reasonable and  predictable amount of time.     Precautions / Restrictions Precautions Precautions: Fall      Mobility  Bed Mobility Overal bed mobility: Modified Independent                  Transfers Overall transfer level: Needs assistance Equipment used: Rolling walker (2 wheels) Transfers: Sit to/from Stand, Bed to chair/wheelchair/BSC Sit to Stand: Min guard   Step pivot transfers: Min guard       General transfer comment: up to stand to RW then stepped to [facility]    Ambulation/Gait Ambulation/Gait assistance: Min guard Gait Distance (Feet): 60 Feet Assistive device: Rolling walker (2 wheels) Gait Pattern/deviations: Decreased stride length, Step-to pattern       General Gait Details: picking up RW throughout till cues for pushing at end of session and pt reported improvement due to UE fatigue; assist for lines throughout, but pt stable with RW  Stairs            Wheelchair Mobility    Modified Rankin (Stroke Patients Only)       Balance Overall balance assessment: Needs assistance   Sitting balance-Leahy Scale: Good       Standing balance-Leahy Scale: Fair Standing balance comment: able to doff and don briefs during toileting                             Pertinent Vitals/Pain Pain Assessment Pain Assessment: Faces Faces Pain Scale: Hurts little more Pain Location: R and posterior head Pain Descriptors / Indicators: Pressure Pain Intervention(s): Monitored during session, Repositioned, Limited activity within patient's tolerance    Home Living Family/patient expects to  be discharged to:: Private residence Living Arrangements: Children Available Help at Discharge: Family;Available PRN/intermittently Type of Home: Apartment Home Access: Stairs to enter Entrance Stairs-Rails: Left Entrance Stairs-Number of Steps: 5 to enter building; flight to second floor then two flights to third floor can rest on landings   Home Layout: One level Home  Equipment: Rollator (4 wheels);Cane - single point;Shower seat;Grab bars - tub/shower;BSC/3in1      Prior Function Prior Level of Function : Independent/Modified Independent             Mobility Comments: has been using AD since having Covid in 2021; 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   Dominant Hand: Right    Extremity/Trunk Assessment   Upper Extremity Assessment Upper Extremity Assessment: Overall WFL for tasks assessed    Lower Extremity Assessment Lower Extremity Assessment: Generalized weakness       Communication   Communication: No difficulties  Cognition Arousal/Alertness: Awake/alert Behavior During Therapy: WFL for tasks assessed/performed Overall Cognitive Status: Within Functional Limits for tasks assessed                                          General Comments General comments (skin integrity, edema, etc.): reports some h/o sinus tach since Covid, but HR 94 and SpO2 95% after ambulation    Exercises     Assessment/Plan    PT Assessment Patient needs continued PT services  PT Problem List Decreased strength;Decreased mobility;Decreased activity tolerance;Decreased balance;Decreased knowledge of use of DME       PT Treatment Interventions DME instruction;Gait training;Therapeutic exercise;Patient/family education;Balance training;Functional mobility training;Therapeutic activities;Stair training    PT Goals (Current goals can be found in the Care Plan section)  Acute Rehab PT Goals Patient Stated Goal: to get stronger PT Goal Formulation: With patient Time For Goal Achievement: 01/19/22 Potential to Achieve Goals: Good    Frequency Min 3X/week     Co-evaluation               AM-PAC PT "6 Clicks" Mobility  Outcome Measure Help needed turning from your back to your side while in a flat bed without using bedrails?: A Little Help needed moving from lying on your back to sitting on the side of a  flat bed without using bedrails?: None Help needed moving to and from a bed to a chair (including a wheelchair)?: A Little Help needed standing up from a chair using your arms (e.g., wheelchair or bedside chair)?: A Little Help needed to walk in hospital room?: A Little Help needed climbing 3-5 steps with a railing? : Total 6 Click Score: 17    End of Session   Activity Tolerance: Patient limited by fatigue Patient left: in bed;with call bell/phone within reach   PT Visit Diagnosis: Other abnormalities of gait and mobility (R26.89);Muscle weakness (generalized) (M62.81)    Time: 3546-5681 PT Time Calculation (min) (ACUTE ONLY): 35 min   Charges:   PT Evaluation $PT Eval Moderate Complexity: 1 Mod PT Treatments $Gait Training: 8-22 mins        Magda Kiel, PT Acute Rehabilitation Services Office:[phone] 01/12/2022   Reginia Naas 01/12/2022, 5:21 PM

## 2022-01-13 LAB — RENAL FUNCTION PANEL
Albumin: 3 g/dL — ABNORMAL LOW (ref 3.5–5.0)
Anion gap: 7 (ref 5–15)
BUN: 8 mg/dL (ref 6–20)
CO2: 26 mmol/L (ref 22–32)
Calcium: 8.8 mg/dL — ABNORMAL LOW (ref 8.9–10.3)
Chloride: 107 mmol/L (ref 98–111)
Creatinine, Ser: 0.81 mg/dL (ref 0.44–1.00)
GFR, Estimated: >60 mL/min (ref >60)
Glucose, Bld: 105 mg/dL — ABNORMAL HIGH (ref 70–99)
Phosphorus: 3.7 mg/dL (ref 2.5–4.6)
Potassium: 3.8 mmol/L (ref 3.5–5.1)
Sodium: 140 mmol/L (ref 135–145)

## 2022-01-13 MED ORDER — HYDROCODONE-ACETAMINOPHEN 5-325 MG PO TABS: 1.0000 | ORAL_TABLET | ORAL | 0 refills | Status: DC | PRN

## 2022-01-13 NOTE — Progress Notes (Signed)
Neurosurgery Service Progress Note  Subjective: No acute events overnight, no drainage from the nares, did well with PT yesterday  Objective: Vitals:   01/12/22 1942 01/12/22 2317 01/13/22 0341 01/13/22 0809  BP: 115/68 113/68 111/73 110/65  Pulse:  85 73 78  Resp: '15 18 18 16  '$ Temp: 98.2 F (36.8 C) 98.3 F (36.8 C) 97.6 F (36.4 C) 98.4 F (36.9 C)  TempSrc: Oral Oral Oral Oral  SpO2:  98% 97% 98%  Weight:      Height:        Physical Exam: Aox3, PERRL, EOMI, FS, visual fields stable  Assessment & Plan: 46 y.o. woman s/p endonasal endoscopic rsxn of pituitary tumor, post-op DI, started DDAVP with resolution, DDAVP stopped 7/17. CSF leak POD1 s/p LD placement, drained x3d then clamped.   -discharge home today with home PT/OT  Judith Part  01/13/22 11:57 AM

## 2022-01-13 NOTE — Progress Notes (Signed)
Discharge instructions reviewed with pt and daughter.  Copy of instructions given to pt, pt informed pain medication script was sent to her pharmacy.  Pt to get dressed, and staff will take out for discharge. Pt primary nurse Wells Guiles informed.

## 2022-01-13 NOTE — TOC Progression Note (Addendum)
Transition of Care [facility]) - Progression Note    Patient Details  Name: Beth [name] MRN: [id number] Date of Birth: 1976-01-02  Transition of Care [facility]) CM/SW [place], RN Phone [phone]  01/13/2022, 1:11 PM  Clinical Narrative:    [facility] consulted for Home health referral. CM at bedside to offer patient choice. Patient has previously had home health with [facility] and would like to have [facility] again. Referral sent to [facility] with [facility]. Awaiting acceptance  [place] referral accepted by [facility]. AVS updated.        Expected Discharge Plan and Services           Expected Discharge Date: 01/13/22                                     Social Determinants of Health (SDOH) Interventions    Readmission Risk Interventions     No data to display

## 2022-01-13 NOTE — Evaluation (Signed)
Occupational Therapy Re-Evaluation Patient Details Name: Beth [name] MRN: [id number] DOB: May 08, 1976 Today's Date: 01/13/2022   History of Present Illness 46 y.o. female admitted 7/12 for endoscopic transnasal pituitary tumor removal. 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 Post-op CSF leak and LD placed 7/14 and d/c 7/19.   Clinical Impression   Pt will have assistance after DC however feel she can benefit form HHOT to maximize safety, reduce risk of falls and increase independence with IADL tasks. Pt has an appointment with her eye doctor on August 9th. Recommend pt refrain from driving until cleared by her eye doctor. All further OT can be addressed by [facility].      Recommendations for follow up therapy are one component of a multi-disciplinary discharge planning process, led by the attending physician.  Recommendations may be updated based on patient status, additional functional criteria and insurance authorization.   Follow Up Recommendations  Home health OT    Assistance Recommended at Discharge Intermittent Supervision/Assistance  Patient can return home with the following A little help with walking and/or transfers;A little help with bathing/dressing/bathroom;Assistance with cooking/housework;Assist for transportation;Help with stairs or ramp for entrance    Functional Status Assessment  Patient has had a recent decline in their functional status and demonstrates the ability to make significant improvements in function in a reasonable and predictable amount of time.  Equipment Recommendations  None recommended by OT    Recommendations for Other Services       Precautions / Restrictions Precautions Precautions: Fall      Mobility Bed Mobility Overal bed mobility: Modified Independent                  Transfers Overall transfer level: Needs assistance     Sit to Stand: Supervision                   Balance     Sitting balance-Leahy Scale: Good       Standing balance-Leahy Scale: Fair                             ADL either performed or assessed with clinical judgement   ADL                                       Functional mobility during ADLs: Supervision/safety General ADL Comments: Has AE at home; pt reports incresed pressure in head when leaning forward; educated on compensatory strategies; recommend pt use wht AE she has at home for socks/underwear/pants. Pt verbalized understanding. Also educated on strategies to reduce risk of falls.     Vision Baseline Vision/History: 1 Wears glasses Additional Comments: reports vision has improved; abl eto see the screen on her ipad without difficulty; has an eye appointment for a follow up in August     Perception     Praxis      Pertinent Vitals/Pain Pain Assessment Pain Assessment: No/denies pain     Hand Dominance Right   Extremity/Trunk Assessment Upper Extremity Assessment Upper Extremity Assessment: Overall WFL for tasks assessed   Lower Extremity Assessment Lower Extremity Assessment: Defer to PT evaluation   Cervical / Trunk Assessment Cervical / Trunk Assessment: Other exceptions (increased body habitus)   Communication Communication Communication: No difficulties   Cognition Arousal/Alertness: Awake/alert Behavior During Therapy: WFL for tasks assessed/performed Overall Cognitive Status:  Within Functional Limits for tasks assessed                                       General Comments       Exercises     Shoulder Instructions      Home Living Family/patient expects to be discharged to:: Private residence Living Arrangements: Children Available Help at Discharge: Family;Available PRN/intermittently Type of Home: Apartment Home Access: Stairs to enter Entrance Stairs-Number of Steps: 5 to enter building; flight to second floor then two flights to third  floor can rest on landings Entrance Stairs-Rails: Left Home Layout: One level     Bathroom Shower/Tub: Teacher, early years/pre: Standard Bathroom Accessibility: No   Home Equipment: Rollator (4 wheels);Cane - single point;Shower seat;Grab bars - tub/shower;BSC/3in1          Prior Functioning/Environment Prior Level of Function : Independent/Modified Independent             Mobility Comments: has been using AD since having Covid in 2021; 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 Problem List: Decreased strength;Decreased activity tolerance;Impaired balance (sitting and/or standing);Impaired vision/perception      OT Treatment/Interventions:      OT Goals(Current goals can be found in the care plan section) Acute Rehab OT Goals Patient Stated Goal: to get stronger and return to work OT Goal Formulation: All assessment and education complete, DC therapy  OT Frequency:      Co-evaluation              AM-PAC OT "6 Clicks" Daily Activity     Outcome Measure Help from another person eating meals?: None Help from another person taking care of personal grooming?: A Little Help from another person toileting, which includes using toliet, bedpan, or urinal?: A Little Help from another person bathing (including washing, rinsing, drying)?: A Little Help from another person to put on and taking off regular upper body clothing?: A Little Help from another person to put on and taking off regular lower body clothing?: A Little 6 Click Score: 19   End of Session Nurse Communication: Other (comment) (DC needs)  Activity Tolerance: Patient tolerated treatment well Patient left: in bed;with call bell/phone within reach (eating lunch)  OT Visit Diagnosis: Unsteadiness on feet (R26.81);Low vision, both eyes (H54.2)                Time: 3557-3220 OT Time Calculation (min): 16 min Charges:  OT General Charges $OT Visit: 1 Visit OT Evaluation $OT Re-eval: 1  Re-eval  Maurie Boettcher, OT/L   Acute OT Clinical Specialist Acute Rehabilitation Services Pager [phone] Office [phone]   [facility] 01/13/2022, 12:29 PM

## 2022-01-15 ENCOUNTER — Encounter: Payer: Self-pay | Admitting: Internal Medicine

## 2022-01-17 DIAGNOSIS — J45909 Unspecified asthma, uncomplicated: Secondary | ICD-10-CM | POA: Diagnosis not present

## 2022-01-17 DIAGNOSIS — K861 Other chronic pancreatitis: Secondary | ICD-10-CM | POA: Diagnosis not present

## 2022-01-17 DIAGNOSIS — M5117 Intervertebral disc disorders with radiculopathy, lumbosacral region: Secondary | ICD-10-CM | POA: Diagnosis not present

## 2022-01-17 DIAGNOSIS — E669 Obesity, unspecified: Secondary | ICD-10-CM | POA: Diagnosis not present

## 2022-01-17 DIAGNOSIS — Z79899 Other long term (current) drug therapy: Secondary | ICD-10-CM | POA: Diagnosis not present

## 2022-01-17 DIAGNOSIS — Z9981 Dependence on supplemental oxygen: Secondary | ICD-10-CM | POA: Diagnosis not present

## 2022-01-17 DIAGNOSIS — Z483 Aftercare following surgery for neoplasm: Secondary | ICD-10-CM | POA: Diagnosis not present

## 2022-01-17 DIAGNOSIS — M1990 Unspecified osteoarthritis, unspecified site: Secondary | ICD-10-CM | POA: Diagnosis not present

## 2022-01-17 DIAGNOSIS — D352 Benign neoplasm of pituitary gland: Secondary | ICD-10-CM | POA: Diagnosis not present

## 2022-01-17 DIAGNOSIS — E232 Diabetes insipidus: Secondary | ICD-10-CM | POA: Diagnosis not present

## 2022-01-17 DIAGNOSIS — Z9181 History of falling: Secondary | ICD-10-CM | POA: Diagnosis not present

## 2022-01-17 DIAGNOSIS — Z6841 Body Mass Index (BMI) 40.0 and over, adult: Secondary | ICD-10-CM | POA: Diagnosis not present

## 2022-01-17 DIAGNOSIS — G4733 Obstructive sleep apnea (adult) (pediatric): Secondary | ICD-10-CM | POA: Diagnosis not present

## 2022-01-17 DIAGNOSIS — E785 Hyperlipidemia, unspecified: Secondary | ICD-10-CM | POA: Diagnosis not present

## 2022-01-17 DIAGNOSIS — I10 Essential (primary) hypertension: Secondary | ICD-10-CM | POA: Diagnosis not present

## 2022-01-18 ENCOUNTER — Encounter: Payer: Self-pay | Admitting: Emergency Medicine

## 2022-01-18 ENCOUNTER — Other Ambulatory Visit: Payer: Self-pay | Admitting: Neurological Surgery

## 2022-01-18 ENCOUNTER — Other Ambulatory Visit (HOSPITAL_COMMUNITY): Payer: Self-pay | Admitting: Neurological Surgery

## 2022-01-18 ENCOUNTER — Encounter: Payer: Self-pay | Admitting: Internal Medicine

## 2022-01-18 DIAGNOSIS — D352 Benign neoplasm of pituitary gland: Secondary | ICD-10-CM | POA: Diagnosis not present

## 2022-01-18 NOTE — Telephone Encounter (Signed)
Dr. Lamonte Sakai, please advise on pt's email regarding her recent surgery and CPAP usage. Thanks.

## 2022-01-19 DIAGNOSIS — J342 Deviated nasal septum: Secondary | ICD-10-CM | POA: Diagnosis not present

## 2022-01-19 DIAGNOSIS — D352 Benign neoplasm of pituitary gland: Secondary | ICD-10-CM | POA: Diagnosis not present

## 2022-01-20 NOTE — Telephone Encounter (Signed)
Would recommend getting back on the CPAP when her surgeons say it is OK.   If we believe naso-sinus obstruction was contributing to her OSA, then we could consider repeating a sleep study. I suspect she will not need one. We can make a follow up OV to discuss.

## 2022-01-24 DIAGNOSIS — E785 Hyperlipidemia, unspecified: Secondary | ICD-10-CM | POA: Diagnosis not present

## 2022-01-24 DIAGNOSIS — K861 Other chronic pancreatitis: Secondary | ICD-10-CM | POA: Diagnosis not present

## 2022-01-24 DIAGNOSIS — Z79899 Other long term (current) drug therapy: Secondary | ICD-10-CM | POA: Diagnosis not present

## 2022-01-24 DIAGNOSIS — Z9181 History of falling: Secondary | ICD-10-CM | POA: Diagnosis not present

## 2022-01-24 DIAGNOSIS — J45909 Unspecified asthma, uncomplicated: Secondary | ICD-10-CM | POA: Diagnosis not present

## 2022-01-24 DIAGNOSIS — E232 Diabetes insipidus: Secondary | ICD-10-CM | POA: Diagnosis not present

## 2022-01-24 DIAGNOSIS — M5117 Intervertebral disc disorders with radiculopathy, lumbosacral region: Secondary | ICD-10-CM | POA: Diagnosis not present

## 2022-01-24 DIAGNOSIS — E669 Obesity, unspecified: Secondary | ICD-10-CM | POA: Diagnosis not present

## 2022-01-24 DIAGNOSIS — Z483 Aftercare following surgery for neoplasm: Secondary | ICD-10-CM | POA: Diagnosis not present

## 2022-01-24 DIAGNOSIS — M1990 Unspecified osteoarthritis, unspecified site: Secondary | ICD-10-CM | POA: Diagnosis not present

## 2022-01-24 DIAGNOSIS — Z9981 Dependence on supplemental oxygen: Secondary | ICD-10-CM | POA: Diagnosis not present

## 2022-01-24 DIAGNOSIS — G4733 Obstructive sleep apnea (adult) (pediatric): Secondary | ICD-10-CM | POA: Diagnosis not present

## 2022-01-24 DIAGNOSIS — Z6841 Body Mass Index (BMI) 40.0 and over, adult: Secondary | ICD-10-CM | POA: Diagnosis not present

## 2022-01-24 DIAGNOSIS — D352 Benign neoplasm of pituitary gland: Secondary | ICD-10-CM | POA: Diagnosis not present

## 2022-01-24 DIAGNOSIS — I10 Essential (primary) hypertension: Secondary | ICD-10-CM | POA: Diagnosis not present

## 2022-01-27 DIAGNOSIS — G4733 Obstructive sleep apnea (adult) (pediatric): Secondary | ICD-10-CM | POA: Diagnosis not present

## 2022-01-27 DIAGNOSIS — E669 Obesity, unspecified: Secondary | ICD-10-CM | POA: Diagnosis not present

## 2022-01-27 DIAGNOSIS — K861 Other chronic pancreatitis: Secondary | ICD-10-CM | POA: Diagnosis not present

## 2022-01-27 DIAGNOSIS — M1990 Unspecified osteoarthritis, unspecified site: Secondary | ICD-10-CM | POA: Diagnosis not present

## 2022-01-27 DIAGNOSIS — Z79899 Other long term (current) drug therapy: Secondary | ICD-10-CM | POA: Diagnosis not present

## 2022-01-27 DIAGNOSIS — M5117 Intervertebral disc disorders with radiculopathy, lumbosacral region: Secondary | ICD-10-CM | POA: Diagnosis not present

## 2022-01-27 DIAGNOSIS — E785 Hyperlipidemia, unspecified: Secondary | ICD-10-CM | POA: Diagnosis not present

## 2022-01-27 DIAGNOSIS — Z9181 History of falling: Secondary | ICD-10-CM | POA: Diagnosis not present

## 2022-01-27 DIAGNOSIS — I10 Essential (primary) hypertension: Secondary | ICD-10-CM | POA: Diagnosis not present

## 2022-01-27 DIAGNOSIS — Z6841 Body Mass Index (BMI) 40.0 and over, adult: Secondary | ICD-10-CM | POA: Diagnosis not present

## 2022-01-27 DIAGNOSIS — Z483 Aftercare following surgery for neoplasm: Secondary | ICD-10-CM | POA: Diagnosis not present

## 2022-01-27 DIAGNOSIS — Z9981 Dependence on supplemental oxygen: Secondary | ICD-10-CM | POA: Diagnosis not present

## 2022-01-27 DIAGNOSIS — J45909 Unspecified asthma, uncomplicated: Secondary | ICD-10-CM | POA: Diagnosis not present

## 2022-01-27 DIAGNOSIS — E232 Diabetes insipidus: Secondary | ICD-10-CM | POA: Diagnosis not present

## 2022-01-27 DIAGNOSIS — D352 Benign neoplasm of pituitary gland: Secondary | ICD-10-CM | POA: Diagnosis not present

## 2022-01-30 DIAGNOSIS — E785 Hyperlipidemia, unspecified: Secondary | ICD-10-CM | POA: Diagnosis not present

## 2022-01-30 DIAGNOSIS — E232 Diabetes insipidus: Secondary | ICD-10-CM | POA: Diagnosis not present

## 2022-01-30 DIAGNOSIS — D352 Benign neoplasm of pituitary gland: Secondary | ICD-10-CM | POA: Diagnosis not present

## 2022-01-30 DIAGNOSIS — Z79899 Other long term (current) drug therapy: Secondary | ICD-10-CM | POA: Diagnosis not present

## 2022-01-30 DIAGNOSIS — M5117 Intervertebral disc disorders with radiculopathy, lumbosacral region: Secondary | ICD-10-CM | POA: Diagnosis not present

## 2022-01-30 DIAGNOSIS — Z483 Aftercare following surgery for neoplasm: Secondary | ICD-10-CM | POA: Diagnosis not present

## 2022-01-30 DIAGNOSIS — I10 Essential (primary) hypertension: Secondary | ICD-10-CM | POA: Diagnosis not present

## 2022-01-30 DIAGNOSIS — Z9181 History of falling: Secondary | ICD-10-CM | POA: Diagnosis not present

## 2022-01-30 DIAGNOSIS — J45909 Unspecified asthma, uncomplicated: Secondary | ICD-10-CM | POA: Diagnosis not present

## 2022-01-30 DIAGNOSIS — G4733 Obstructive sleep apnea (adult) (pediatric): Secondary | ICD-10-CM | POA: Diagnosis not present

## 2022-01-30 DIAGNOSIS — K861 Other chronic pancreatitis: Secondary | ICD-10-CM | POA: Diagnosis not present

## 2022-01-30 DIAGNOSIS — E669 Obesity, unspecified: Secondary | ICD-10-CM | POA: Diagnosis not present

## 2022-01-30 DIAGNOSIS — Z9981 Dependence on supplemental oxygen: Secondary | ICD-10-CM | POA: Diagnosis not present

## 2022-01-30 DIAGNOSIS — Z6841 Body Mass Index (BMI) 40.0 and over, adult: Secondary | ICD-10-CM | POA: Diagnosis not present

## 2022-01-30 DIAGNOSIS — M1990 Unspecified osteoarthritis, unspecified site: Secondary | ICD-10-CM | POA: Diagnosis not present

## 2022-01-31 DIAGNOSIS — E232 Diabetes insipidus: Secondary | ICD-10-CM | POA: Diagnosis not present

## 2022-01-31 DIAGNOSIS — K861 Other chronic pancreatitis: Secondary | ICD-10-CM | POA: Diagnosis not present

## 2022-01-31 DIAGNOSIS — M1990 Unspecified osteoarthritis, unspecified site: Secondary | ICD-10-CM | POA: Diagnosis not present

## 2022-01-31 DIAGNOSIS — G4733 Obstructive sleep apnea (adult) (pediatric): Secondary | ICD-10-CM | POA: Diagnosis not present

## 2022-01-31 DIAGNOSIS — Z483 Aftercare following surgery for neoplasm: Secondary | ICD-10-CM | POA: Diagnosis not present

## 2022-01-31 DIAGNOSIS — Z79899 Other long term (current) drug therapy: Secondary | ICD-10-CM | POA: Diagnosis not present

## 2022-01-31 DIAGNOSIS — I10 Essential (primary) hypertension: Secondary | ICD-10-CM | POA: Diagnosis not present

## 2022-01-31 DIAGNOSIS — Z9181 History of falling: Secondary | ICD-10-CM | POA: Diagnosis not present

## 2022-01-31 DIAGNOSIS — J45909 Unspecified asthma, uncomplicated: Secondary | ICD-10-CM | POA: Diagnosis not present

## 2022-01-31 DIAGNOSIS — E669 Obesity, unspecified: Secondary | ICD-10-CM | POA: Diagnosis not present

## 2022-01-31 DIAGNOSIS — Z6841 Body Mass Index (BMI) 40.0 and over, adult: Secondary | ICD-10-CM | POA: Diagnosis not present

## 2022-01-31 DIAGNOSIS — M5117 Intervertebral disc disorders with radiculopathy, lumbosacral region: Secondary | ICD-10-CM | POA: Diagnosis not present

## 2022-01-31 DIAGNOSIS — Z9981 Dependence on supplemental oxygen: Secondary | ICD-10-CM | POA: Diagnosis not present

## 2022-01-31 DIAGNOSIS — E785 Hyperlipidemia, unspecified: Secondary | ICD-10-CM | POA: Diagnosis not present

## 2022-01-31 DIAGNOSIS — D352 Benign neoplasm of pituitary gland: Secondary | ICD-10-CM | POA: Diagnosis not present

## 2022-02-01 DIAGNOSIS — B352 Tinea manuum: Secondary | ICD-10-CM | POA: Diagnosis not present

## 2022-02-01 DIAGNOSIS — G4733 Obstructive sleep apnea (adult) (pediatric): Secondary | ICD-10-CM | POA: Diagnosis not present

## 2022-02-06 DIAGNOSIS — E785 Hyperlipidemia, unspecified: Secondary | ICD-10-CM | POA: Diagnosis not present

## 2022-02-06 DIAGNOSIS — E669 Obesity, unspecified: Secondary | ICD-10-CM | POA: Diagnosis not present

## 2022-02-06 DIAGNOSIS — Z6841 Body Mass Index (BMI) 40.0 and over, adult: Secondary | ICD-10-CM | POA: Diagnosis not present

## 2022-02-06 DIAGNOSIS — K861 Other chronic pancreatitis: Secondary | ICD-10-CM | POA: Diagnosis not present

## 2022-02-06 DIAGNOSIS — I10 Essential (primary) hypertension: Secondary | ICD-10-CM | POA: Diagnosis not present

## 2022-02-06 DIAGNOSIS — Z9181 History of falling: Secondary | ICD-10-CM | POA: Diagnosis not present

## 2022-02-06 DIAGNOSIS — D352 Benign neoplasm of pituitary gland: Secondary | ICD-10-CM | POA: Diagnosis not present

## 2022-02-06 DIAGNOSIS — G4733 Obstructive sleep apnea (adult) (pediatric): Secondary | ICD-10-CM | POA: Diagnosis not present

## 2022-02-06 DIAGNOSIS — M5117 Intervertebral disc disorders with radiculopathy, lumbosacral region: Secondary | ICD-10-CM | POA: Diagnosis not present

## 2022-02-06 DIAGNOSIS — J45909 Unspecified asthma, uncomplicated: Secondary | ICD-10-CM | POA: Diagnosis not present

## 2022-02-06 DIAGNOSIS — Z9981 Dependence on supplemental oxygen: Secondary | ICD-10-CM | POA: Diagnosis not present

## 2022-02-06 DIAGNOSIS — M1990 Unspecified osteoarthritis, unspecified site: Secondary | ICD-10-CM | POA: Diagnosis not present

## 2022-02-06 DIAGNOSIS — Z483 Aftercare following surgery for neoplasm: Secondary | ICD-10-CM | POA: Diagnosis not present

## 2022-02-06 DIAGNOSIS — E232 Diabetes insipidus: Secondary | ICD-10-CM | POA: Diagnosis not present

## 2022-02-06 DIAGNOSIS — Z79899 Other long term (current) drug therapy: Secondary | ICD-10-CM | POA: Diagnosis not present

## 2022-02-07 DIAGNOSIS — M5117 Intervertebral disc disorders with radiculopathy, lumbosacral region: Secondary | ICD-10-CM | POA: Diagnosis not present

## 2022-02-07 DIAGNOSIS — E785 Hyperlipidemia, unspecified: Secondary | ICD-10-CM | POA: Diagnosis not present

## 2022-02-07 DIAGNOSIS — M1990 Unspecified osteoarthritis, unspecified site: Secondary | ICD-10-CM | POA: Diagnosis not present

## 2022-02-07 DIAGNOSIS — D352 Benign neoplasm of pituitary gland: Secondary | ICD-10-CM | POA: Diagnosis not present

## 2022-02-07 DIAGNOSIS — J45909 Unspecified asthma, uncomplicated: Secondary | ICD-10-CM | POA: Diagnosis not present

## 2022-02-07 DIAGNOSIS — K861 Other chronic pancreatitis: Secondary | ICD-10-CM | POA: Diagnosis not present

## 2022-02-07 DIAGNOSIS — E669 Obesity, unspecified: Secondary | ICD-10-CM | POA: Diagnosis not present

## 2022-02-07 DIAGNOSIS — Z79899 Other long term (current) drug therapy: Secondary | ICD-10-CM | POA: Diagnosis not present

## 2022-02-07 DIAGNOSIS — E232 Diabetes insipidus: Secondary | ICD-10-CM | POA: Diagnosis not present

## 2022-02-07 DIAGNOSIS — Z9181 History of falling: Secondary | ICD-10-CM | POA: Diagnosis not present

## 2022-02-07 DIAGNOSIS — G4733 Obstructive sleep apnea (adult) (pediatric): Secondary | ICD-10-CM | POA: Diagnosis not present

## 2022-02-07 DIAGNOSIS — I10 Essential (primary) hypertension: Secondary | ICD-10-CM | POA: Diagnosis not present

## 2022-02-07 DIAGNOSIS — Z6841 Body Mass Index (BMI) 40.0 and over, adult: Secondary | ICD-10-CM | POA: Diagnosis not present

## 2022-02-07 DIAGNOSIS — Z483 Aftercare following surgery for neoplasm: Secondary | ICD-10-CM | POA: Diagnosis not present

## 2022-02-07 DIAGNOSIS — Z9981 Dependence on supplemental oxygen: Secondary | ICD-10-CM | POA: Diagnosis not present

## 2022-02-10 DIAGNOSIS — Z79899 Other long term (current) drug therapy: Secondary | ICD-10-CM | POA: Diagnosis not present

## 2022-02-10 DIAGNOSIS — E785 Hyperlipidemia, unspecified: Secondary | ICD-10-CM | POA: Diagnosis not present

## 2022-02-10 DIAGNOSIS — M5117 Intervertebral disc disorders with radiculopathy, lumbosacral region: Secondary | ICD-10-CM | POA: Diagnosis not present

## 2022-02-10 DIAGNOSIS — Z483 Aftercare following surgery for neoplasm: Secondary | ICD-10-CM | POA: Diagnosis not present

## 2022-02-10 DIAGNOSIS — E669 Obesity, unspecified: Secondary | ICD-10-CM | POA: Diagnosis not present

## 2022-02-10 DIAGNOSIS — Z6841 Body Mass Index (BMI) 40.0 and over, adult: Secondary | ICD-10-CM | POA: Diagnosis not present

## 2022-02-10 DIAGNOSIS — I10 Essential (primary) hypertension: Secondary | ICD-10-CM | POA: Diagnosis not present

## 2022-02-10 DIAGNOSIS — M1990 Unspecified osteoarthritis, unspecified site: Secondary | ICD-10-CM | POA: Diagnosis not present

## 2022-02-10 DIAGNOSIS — Z9981 Dependence on supplemental oxygen: Secondary | ICD-10-CM | POA: Diagnosis not present

## 2022-02-10 DIAGNOSIS — J45909 Unspecified asthma, uncomplicated: Secondary | ICD-10-CM | POA: Diagnosis not present

## 2022-02-10 DIAGNOSIS — K861 Other chronic pancreatitis: Secondary | ICD-10-CM | POA: Diagnosis not present

## 2022-02-10 DIAGNOSIS — E232 Diabetes insipidus: Secondary | ICD-10-CM | POA: Diagnosis not present

## 2022-02-10 DIAGNOSIS — Z9181 History of falling: Secondary | ICD-10-CM | POA: Diagnosis not present

## 2022-02-10 DIAGNOSIS — G4733 Obstructive sleep apnea (adult) (pediatric): Secondary | ICD-10-CM | POA: Diagnosis not present

## 2022-02-10 DIAGNOSIS — D352 Benign neoplasm of pituitary gland: Secondary | ICD-10-CM | POA: Diagnosis not present

## 2022-02-15 DIAGNOSIS — E232 Diabetes insipidus: Secondary | ICD-10-CM | POA: Diagnosis not present

## 2022-02-15 DIAGNOSIS — J45909 Unspecified asthma, uncomplicated: Secondary | ICD-10-CM | POA: Diagnosis not present

## 2022-02-15 DIAGNOSIS — K861 Other chronic pancreatitis: Secondary | ICD-10-CM | POA: Diagnosis not present

## 2022-02-15 DIAGNOSIS — G4733 Obstructive sleep apnea (adult) (pediatric): Secondary | ICD-10-CM | POA: Diagnosis not present

## 2022-02-15 DIAGNOSIS — M1990 Unspecified osteoarthritis, unspecified site: Secondary | ICD-10-CM | POA: Diagnosis not present

## 2022-02-15 DIAGNOSIS — Z6841 Body Mass Index (BMI) 40.0 and over, adult: Secondary | ICD-10-CM | POA: Diagnosis not present

## 2022-02-15 DIAGNOSIS — Z9981 Dependence on supplemental oxygen: Secondary | ICD-10-CM | POA: Diagnosis not present

## 2022-02-15 DIAGNOSIS — E785 Hyperlipidemia, unspecified: Secondary | ICD-10-CM | POA: Diagnosis not present

## 2022-02-15 DIAGNOSIS — Z483 Aftercare following surgery for neoplasm: Secondary | ICD-10-CM | POA: Diagnosis not present

## 2022-02-15 DIAGNOSIS — D352 Benign neoplasm of pituitary gland: Secondary | ICD-10-CM | POA: Diagnosis not present

## 2022-02-15 DIAGNOSIS — I10 Essential (primary) hypertension: Secondary | ICD-10-CM | POA: Diagnosis not present

## 2022-02-15 DIAGNOSIS — M5117 Intervertebral disc disorders with radiculopathy, lumbosacral region: Secondary | ICD-10-CM | POA: Diagnosis not present

## 2022-02-15 DIAGNOSIS — E669 Obesity, unspecified: Secondary | ICD-10-CM | POA: Diagnosis not present

## 2022-02-15 DIAGNOSIS — Z79899 Other long term (current) drug therapy: Secondary | ICD-10-CM | POA: Diagnosis not present

## 2022-02-15 DIAGNOSIS — Z9181 History of falling: Secondary | ICD-10-CM | POA: Diagnosis not present

## 2022-02-18 ENCOUNTER — Ambulatory Visit (HOSPITAL_COMMUNITY)
Admission: RE | Admit: 2022-02-18 | Discharge: 2022-02-18 | Disposition: A | Discharge status: Home / Self Care | Payer: BC Managed Care – PPO | Source: Ambulatory Visit | Attending: Neurological Surgery | Admitting: Neurological Surgery

## 2022-02-18 DIAGNOSIS — D497 Neoplasm of unspecified behavior of endocrine glands and other parts of nervous system: Secondary | ICD-10-CM | POA: Diagnosis not present

## 2022-02-18 DIAGNOSIS — Z9889 Other specified postprocedural states: Secondary | ICD-10-CM | POA: Diagnosis not present

## 2022-02-18 DIAGNOSIS — D352 Benign neoplasm of pituitary gland: Secondary | ICD-10-CM | POA: Insufficient documentation

## 2022-02-18 MED ORDER — GADOBUTROL 1 MMOL/ML IV SOLN
10.0000 mL | Freq: Once | INTRAVENOUS | Status: AC | PRN
Start: 2022-02-18 — End: 2022-02-18
  Administered 2022-02-18: 10 mL via INTRAVENOUS

## 2022-02-24 DIAGNOSIS — H40013 Open angle with borderline findings, low risk, bilateral: Secondary | ICD-10-CM | POA: Diagnosis not present

## 2022-03-02 DIAGNOSIS — G4733 Obstructive sleep apnea (adult) (pediatric): Secondary | ICD-10-CM | POA: Diagnosis not present

## 2022-03-06 ENCOUNTER — Ambulatory Visit (INDEPENDENT_AMBULATORY_CARE_PROVIDER_SITE_OTHER): Payer: BC Managed Care – PPO | Admitting: Internal Medicine

## 2022-03-06 ENCOUNTER — Other Ambulatory Visit: Payer: Self-pay | Admitting: Radiation Therapy

## 2022-03-06 ENCOUNTER — Encounter: Payer: Self-pay | Admitting: Internal Medicine

## 2022-03-06 VITALS — BP 132/80 | HR 74 | Ht 69.0 in | Wt 278.0 lb

## 2022-03-06 DIAGNOSIS — E232 Diabetes insipidus: Secondary | ICD-10-CM

## 2022-03-06 DIAGNOSIS — E221 Hyperprolactinemia: Secondary | ICD-10-CM

## 2022-03-06 DIAGNOSIS — R7989 Other specified abnormal findings of blood chemistry: Secondary | ICD-10-CM

## 2022-03-06 DIAGNOSIS — D352 Benign neoplasm of pituitary gland: Secondary | ICD-10-CM | POA: Diagnosis not present

## 2022-03-06 NOTE — Progress Notes (Addendum)
Patient ID: Beth [name], female   DOB: November 28, 1975, 46 y.o.   MRN: [id number]  HPI  Beth [name] is a 46 y.o.-year-old female, presenting for follow-up for pituitary macroadenoma, now s/p transsphenoidal surgery.  She previously saw Dr. Loanne Drilling, last visit 4.5 months ago.  Pt. was found to have a slightly high prolactin level in 2007 when she presented with galactorrhea. She was started on Metformin. 1 Month later, she got pregnant.  She stopped Metformin when pregnancy was detected.  In 2013, she saw another ObGyn for current galactorrhea.  Pregnancy was ruled out. PRL was high. She was started on Bromocriptine.  She moved to [place] in 2015. She continued Bromocriptine.  She was referred to Dr. Loanne Drilling in 01/2018.  She was still on bromocriptine when she started to see him and she was maintained on a low-dose by Dr. Loanne Drilling.  In 06/2021, PRL again returned high >> Bromocriptine was increased from 2.5 to 5 mg daily but she could not tolerate it >> back to 2.5 mg.  She then saw ophthalmology >> Bilateral VF cut >> rec'd an MRI >> sent for a pituitary MRI in 10/2021.  This showed a very large pituitary tumor, consistent with a macroadenoma with significant mass effect on the optic chiasm.  The MRI results return to me after Dr. Loanne Drilling left the practice.  We checked her pituitary hormones and these were normal.  I referred her to neurosurgery and she had transsphenoidal surgery 01/04/2022 by Dr. Venetia Constable.  She returns for the first visit with me today.  Reviewed previous imaging investigations: Pituitary MRI (11/12/2021): Brain: Very large heterogeneously enhancing sellar/suprasellar mass, which involves the pituitary gland and measures up to 4.1 x 3.4 x 4.1 cm (AP by transverse by craniocaudal). Significant suprasellar extension with significant mass effect on the optic chiasm. Tumor extends into the sphenoid sinuses bilaterally. Definite right and probable left cavernous sinus invasion  with apparent encasement of the right greater than left internal carotid arteries.   No evidence of acute infarct, acute hemorrhage, hydrocephalus, midline shift, or extra-axial fluid collection. No pathologic enhancement outside of the pituitary/sella.   Vascular: Major arterial flow voids are maintained at the skull base.   Skull and upper cervical spine: Normal marrow signal.   Sinuses/Orbits: Tumor within the sphenoid sinuses, described above.  Otherwise, sinuses are largely clear.   Other: No mastoid effusions.ac   IMPRESSION: Very large 4.1 x 3.4 by 4.1 cm sellar/suprasellar mass with significant mass effect on the optic chiasm, cavernous sinus invasion, and extension into the sphenoid sinus, probably a pituitary macroadenoma. Recommend neurosurgical consultation.      Patient had transsphenoidal surgery (01/04/2022):  Pituitary tumor pathology was reviewed but this was, disappointingly, reported only as neuroendocrine tumor, without any staining to clarify the etiology or aggressive potential: Pituitary neuroendocrine tumor (adenoma of anterior pituitary gland), fragmented.  There is no tumor necrosis.  Adjacent strips of normal bone and nasal mucosa.  There are no cytologic features diagnostic of malignancy.   GROSS DESCRIPTION:   Received fresh are portions of soft tan-pink to hemorrhagic tissue  measuring 2.5 x 2.5 x 0.8 cm in aggregate.  There is a 1 cm portion of  bone present.  The specimen is entirely submitted in 4 cassettes  including decalcified bone.  (GRP 01/04/2022)   CT sinuses (12/09/2021) [facility]: FINDINGS:  Paranasal sinuses:   Frontal: Mild mucosal thickening within the frontoethmoidal  recesses, bilaterally. Partial opacification of the frontal sinus  drainage pathways, bilaterally.   Ethmoid:  Mild mucosal thickening within the anterior ethmoid air  cells, bilaterally. Mild mucosal thickening within a posterior right  ethmoid air cell.    Maxillary: Mild mucosal thickening versus small mucous retention  cyst along the floor of the right maxillary sinus. The left  maxillary sinus is normally aerated.   Sphenoid: 4.1 cm sellar/suprasellar mass more fully delineated on  the prior brain MRI of 11/12/2021. As before, the mass  remodels/erodes portions of the sella turcica and extends into the  bilateral sphenoid sinuses.   Right ostiomeatal unit: The maxillary sinus ostium is obstructed by  mucosal thickening and/or secretions.   Left ostiomeatal unit: Patent.   Nasal passages: Mild leftward deviation of the nasal septum  inferiorly with slight leftward bony spurring. Mild mucosal  thickening within the bilateral nasal passages. Pneumatization of  the middle turbinate vertical lamellae, bilaterally.   Anatomy: Pneumatization is present superior to the anterior ethmoid  notches, bilaterally. Symmetric and intact olfactory grooves and  fovea ethmoidalis, Keros III (8-28m). Sellar sphenoid  pneumatization pattern.   IMPRESSION:  1. 4.1 cm sellar/suprasellar mass more fully delineated on the prior  brain MRI of 11/12/2021. As before, the mass remodels/erodes  portions of the sella turcica and extends into the bilateral  sphenoid sinuses.  ...  Pituitary MRI obtained after surgery (02/20/2022): Showed large amount of persistent tissue in the right cavernous sinus:  Resolved suprasellar mass effect including on the optic chiasm (series 3, image 14). Intraorbital soft tissues remain stable and within normal limits. Postoperative changes to the posterior ethmoid and sphenoid sinuses, see additional details below. Mastoids remain clear. Visible internal auditory structures appear normal.   Other: Interval intra-sellar and suprasellar resection of tumor. Resolve suprasellar mass effect. Hypothalamus is within normal limits. Infundibulum is now visible, with some downward traction (series 12, image 8). Infundibulum is  also deviated into the left sella turcica (series 14, image 8). Subjacent fairly homogeneously enhancing soft tissue there is likely normal pituitary. But this is inseparable from heterogeneously enhancing residual tumor which tracks posteriorly and laterally from the sella turcica into and across the right cavernous sinus as before. Confluent residual tumor is estimated at 26 (series 12, image 4) x 30 x 17 mm (images 6 and 7 of series 14), AP by transverse by CC. Right IAC siphon is engulfed as left-side/intra sellar tumor margin with the residual pituitary is difficult to discern on some images. But the left cavernous sinus appears to remain spared.   Superimposed abnormal signal and enhancement in the postoperative sphenoid sinuses. In the midline and to the right of midline most of this is intrinsically T1 hyperintense (series 9, image 10) compatible with postoperative fat and/or inspissated secretions. Rim enhancing, but centrally T1 and T2 hypointense abnormality throughout the central and left sphenoid sinus appears fairly distinct from the residual tumor in the cavernous sinus and is probably paranasal sinus mucosal disease and/or granulation tissue with inspissated secretions. ...   IMPRESSION: 1. Interval debulking of intra-sellar and suprasellar pituitary adenoma. Resolved suprasellar mass effect. Residual tumor with epicenter at the right cavernous sinus is approximately 2.6 x 3.0 x 1.7 cm. See series 14, images 6 and 7. Left cavernous sinus seems spared. Abnormal mixed signal and enhancing material throughout both sphenoid sinuses is favored to be postoperative and/or sinus mucosal disease related. Attention is directed on follow-up.    After surgery, her TSH was low, but she was not started on levothyroxine pending further investigation. LADA, she developed increased thirst and urination was  started on DDAVP 0.1 mg twice a day. The rest of the hormones were not  checked at that time. After the surgery, we got in contact and I advised her to stop bromocriptine.  She describes that she had neck stiffness after surgery, and she saw ENT.  The symptoms improved after ABx. At this time, she R posterior headaches especially with changing position. Feel that she lost weight after the surgery.  I reviewed pt's pertinent tests: Lab Results  Component Value Date   PROLACTIN 1.8 (L) 10/21/2021   PROLACTIN 40.0 (H) 07/15/2021   PROLACTIN 32.1 (H) 06/11/2020   PROLACTIN 5.4 05/15/2019   PROLACTIN 32.9 (H) 04/03/2019   PROLACTIN 5.5 03/22/2018   PROLACTIN 34.9 (H) 02/15/2018   Component Ref Range & Units 3 mo ago  PROLACTIN,UNDILUTED 2.0 - 30.0 ng/mL 2.8   PROLACTIN,DILUTED ng/mL   Comment: Result confirmed by 1:100 dilution. No high dose  hook effect detected.              Reference Range   Females          Non-pregnant        3.0-30.0          Pregnant           10.0-209.0          Postmenopausal      2.0-20.0     Lab Results  Component Value Date   TSH 0.026 (L) 01/09/2022   TSH 0.73 11/15/2021   TSH 0.71 10/21/2021   TSH 1.030 06/13/2021   TSH 0.84 06/11/2020   TSH 0.440 (L) 02/19/2020   TSH 1.24 04/03/2019   TSH 0.42 02/15/2018   FREET4 1.00 01/09/2022   FREET4 0.70 11/15/2021   FREET4 1.0 10/21/2021   FREET4 0.99 06/13/2021   FREET4 0.72 06/11/2020   FREET4 1.27 02/24/2020   FREET4 0.78 02/15/2018    Lab Results  Component Value Date   T3FREE 2.6 11/15/2021   Component     Latest Ref Rng 11/15/2021 01/09/2022  IGF-I, LC/MS     52 - 328 ng/mL 124    Z-Score (Female)     -2.0 - 2.0 SD -0.3    FSH     mIU/ML 5.8    LH     mIU/mL 2.28    Cortisol, Plasma     ug/dL 6.0  18.7   C206 ACTH     6 - 50 pg/mL 22     She has a paternal aunt with a Prolactinoma (recent dx) >> resected.  Pt. also has a history of chronic pancreatitis, DDD, HTN, HL, obesity, sciatica.  ROS: + see HPI  Past Medical History:  Diagnosis Date    Asthma    Chronic pancreatitis ([place])    DDD (degenerative disc disease), lumbosacral    DJD (degenerative joint disease)    Headache    Hyperlipidemia    Hypertension    Hypoglycemia    Obesity    Osteoarthritis    Prolactinoma ([place])    Sciatica    Sleep apnea    Past Surgical History:  Procedure Laterality Date   CHOLECYSTECTOMY     COLONOSCOPY     CRANIOTOMY N/A 01/04/2022   Procedure: Endonasal endoscopic resection of pituitary tumor;  Surgeon: Judith Part, MD;  Location: [place];  Service: Neurosurgery;  Laterality: N/A;  RM 20   NASAL SINUS SURGERY N/A 01/04/2022   Procedure: ENDOSCOPIC SINUS SURGERY TRANSNASAL PITUITARY REMOVAL WITH NASAL NASOSETTAL FLAP;  Surgeon: Fredric Dine,  Meghan A, DO;  Location: [facility];  Service: ENT;  Laterality: N/A;   Social History   Socioeconomic History   Marital status: Divorced    Spouse name: Not on file   Number of children: Not on file   Years of education: Not on file   Highest education level: Not on file  Occupational History   Not on file  Tobacco Use   Smoking status: Never    Passive exposure: Never   Smokeless tobacco: Never  Vaping Use   Vaping Use: Never used  Substance and Sexual Activity   Alcohol use: Never   Drug use: Never   Sexual activity: Yes  Other Topics Concern   Not on file  Social History Narrative   Not on file   Social Determinants of Health   Financial Resource Strain: Not on file  Food Insecurity: Not on file  Transportation Needs: Not on file  Physical Activity: Not on file  Stress: Not on file  Social Connections: Not on file  Intimate Partner Violence: Not on file   Current Outpatient Medications on File Prior to Visit  Medication Sig Dispense Refill   Ascorbic Acid (VITAMIN C) 1000 MG tablet Take 1,000 mg by mouth daily.     baclofen (LIORESAL) 10 MG tablet Take 0.5-1 tablets (5-10 mg total) by mouth 3 (three) times daily as needed for muscle spasms. 30 each 3   benzonatate (TESSALON)  200 MG capsule Take 1 capsule (200 mg total) by mouth 3 (three) times daily as needed for cough. 30 capsule 3   bromocriptine (PARLODEL) 5 MG capsule Take 1 capsule (5 mg total) by mouth daily. (Patient taking differently: Take 2.5 mg by mouth daily.) 90 capsule 3   Butenafine HCl (MENTAX) 1 % cream Apply 1 application topically 2 (two) times daily. (Patient taking differently: Apply 1 application  topically 2 (two) times daily as needed (athlete's foot).) 24 g 1   Cholecalciferol (VITAMIN D3 PO) Take 1 capsule by mouth daily.     Docusate Sodium (COLACE PO) Take 1 capsule by mouth daily.     EPINEPHrine 0.3 mg/0.3 mL IJ SOAJ injection Inject 0.3 mg into the muscle as needed for anaphylaxis.     fluticasone (FLONASE) 50 MCG/ACT nasal spray Place 2 sprays into both nostrils daily. 16 g 3   HYDROcodone-acetaminophen (NORCO/VICODIN) 5-325 MG tablet Take 1 tablet by mouth every 4 (four) hours as needed (pain). 30 tablet 0   levonorgestrel (MIRENA) 20 MCG/24HR IUD 1 each by Intrauterine route once.      Menthol, Topical Analgesic, (BIOFREEZE) 10 % CREA Apply 1 Application topically daily as needed (pain).     NON FORMULARY Pt uses a cpap nightly     omega-3 fish oil (MAXEPA) 1000 MG CAPS capsule Take 1 capsule (1,000 mg total) by mouth 2 (two) times daily. 180 capsule 1   Pancrelipase, Lip-Prot-Amyl, (CREON) 24000-76000 units CPEP Take 2 capsules by mouth See admin instructions. Take 2 capsules three times daily with food and 1 capsule twice daily with snacks.     pantoprazole (PROTONIX) 20 MG tablet Take 20 mg by mouth daily.     psyllium (REGULOID) 0.52 g capsule Take 0.52 capsules by mouth daily.     rosuvastatin (CRESTOR) 40 MG tablet Take 1 tablet (40 mg total) by mouth daily. 90 tablet 3   Trospium Chloride 60 MG CP24 Take 1 capsule by mouth daily as needed (overactive bladder).     Zinc Sulfate (ZINC 15 PO) Take  15 mg by mouth daily.     No current facility-administered medications on file prior  to visit.   Allergies  Allergen Reactions   Bahia Hives, Shortness Of Breath and Swelling    Found through allergy testing, not sure which item caused the shortness of breath   [place] Grass Hives, Shortness Of Breath and Swelling    Found through allergy testing, not sure which item caused the shortness of breath   SYSCO, Shortness Of Breath and Swelling    Found through allergy testing, not sure which item caused the shortness of breath   Cladosporium Cladosporioides Hives, Shortness Of Breath and Swelling    Found through allergy testing, not sure which item caused the shortness of breath   Dust Mite Extract Hives, Shortness Of Breath and Swelling    Found through allergy testing, not sure which item caused the shortness of breath   Elm Bark [Ulmus Fulva] Hives, Shortness Of Breath and Swelling    Found through allergy testing, not sure which item caused the shortness of breath   Molds & Smuts Hives, Shortness Of Breath and Swelling    Found through allergy testing, not sure which item caused the shortness of breath   Mugwort Hives, Shortness Of Breath and Swelling    Found through allergy testing, not sure which item caused the shortness of breath   Nettle [Nettle (Urtica Dioica)] Hives, Shortness Of Breath and Swelling   Red Mulberry Allergy Skin Test Hives, Shortness Of Breath and Swelling    Found through allergy testing, not sure which item caused the shortness of breath   Sorrel-Dock Mix [Sheep Sorrel-Yellow Dock] Hives, Shortness Of Breath and Swelling    Found through allergy testing, not sure which item caused the shortness of breath   Timothy Grass Pollen Allergen Hives, Shortness Of Breath and Swelling    Found through allergy testing, not sure which item caused the shortness of breath   American Cockroach    Mixed Ragweed    Family History  Problem Relation Age of Onset   Other Paternal Aunt        pituitary surgery   Hypertension Mother    Diabetes Mother     Colon cancer Mother    Arthritis Mother    Thyroid disease Mother    Asthma Mother    Diabetes Father    Hypertension Father    Bell's palsy Father     PE: BP 132/80 (BP Location: Right Arm, Patient Position: Sitting, Cuff Size: Normal)   Pulse 74   Ht '5\' 9"'  (1.753 m)   Wt 278 lb (126.1 kg)   SpO2 97%   BMI 41.05 kg/m  Wt Readings from Last 3 Encounters:  03/06/22 278 lb (126.1 kg)  01/04/22 285 lb (129.3 kg)  12/28/21 284 lb (128.8 kg)   Constitutional: overweight, in NAD Eyes: PERRLA, EOMI, no exophthalmos ENT: moist mucous membranes, no thyromegaly, no cervical lymphadenopathy Cardiovascular: RRR, No MRG Respiratory: CTA B Musculoskeletal: no deformities Skin: moist, warm, no rashes Neurological: no tremor with outstretched hands  ASSESSMENT: 1. Pituitary macroadenoma - s/p resection 01/09/2022  2. H/o Hyperprolactinemia  3.  Low TSH  4. DI  PLAN:  1.  Complex patient with a relatively newly diagnosed pituitary macroadenoma, checked after many years of slightly elevated prolactin. -The tumor was very large, and was impinging on the optic chiasm -After we checked a pituitary labs which returned grossly normal, including a prolactin level check with dilution to eliminate a possible "  hook" effect, patient was referred to neurosurgery.  -She had transsphenoidal surgery on 01/04/2022.  The tumor was debulked but it was impossible to be completely resected.  She still has significant residual tumor in the right cavernous sinus, unlikely to be amenable to surgery. -After the surgery, we stopped her bromocriptine -Sodium levels were followed closely after the surgery and they have been normal, however, patient had increased urination and thirst and she was started on DDAVP, which she continues now -At today's visit, she is feeling well, with improved vision and without headaches, but she does have some intracranial pressure especially when changes positions. -She has a  visual field checked after the surgery and it appears that the vision in one of her eyes has improved.  I will need to obtain these records. -At today's visit we discussed that it would be extremely important to find out exactly what type of tumor she had.  She most likely had a silent adenoma, since pituitary work-up before the surgery was normal.  However, it is important to know what the tumor stains for to predict aggressivity and, since further treatment is most likely needed, which medications to treat for.  ACTH staining pituitary macroadenoma are known to be more aggressive and with higher propensity for recurrence.  Growth hormone staining pituitary adenomas can also be more aggressive, especially if they are sparsely granulated, in which case, they are also less likely to be responsive to somatostatin analogs.  LH or FSH staining adenomas are more indolent.  A Ki67 (proliferation) marker would also be helpful to predict more aggressive tumors.  For all these reasons, I would definitely prefer to have more information from the pituitary pathology.  Patient saw Dr. Venetia Constable last week and she was advised that the tumor board has not met yet.  As soon as they meet, she will be given an appointment with oncology.  Also, there will be a discussion with the cytopathology department to see if some of these stains can be performed. -We also discussed that she may need radiation therapy in the future, depending on the growth of the remnant tumor.  She was already advised about this and will see oncology soon. -As of now, we discussed about checking her pituitary labs again-she will return fasting, between 8 and 9 in the morning to see if hormone replacement is needed -Plan to check another pituitary MRI in 6 months from the previous -She continues to also follow with Dr. Venetia Constable -Return in about 4 months  2.  Hyperprolactinemia -Patient had a 10-year history of slightly elevated prolactin, with was  previously treated with bromocriptine -After her dietary MRI obtained earlier this year showing a large pituitary tumor, the prolactin elevation was most likely related to pituitary stalk compression -we stopped bromocriptine after her transsphenoidal surgery -We will recheck her prolactin along with the rest of her pituitary labs at next lab draw  3.  Low TSH - latest thyroid labs reviewed with pt. >> TSH was suppressed, all previous levels were normal: Lab Results  Component Value Date   TSH 0.026 (L) 01/09/2022  - she is not on LT4 - will check thyroid tests at next lab draw: Free T3, free T4 and TSH  4. DI - on dd AVP 0.1 mg 2x a day - started ~1 mo ago - She did not try to skip doses or delay the doses - she saw urology before - weak bladder wall -she was previously on medication, which was stopped since. -  previous sodium levels were reviewed and were normal - at this visit, we discussed about stopping the morning dose of DDAVP to see if she absolutely needed.  If she cannot tolerate stopping this dose, I advised her to also try to skip the evening dose  Orders Placed This Encounter  Procedures   TSH   T4, free   T3, free   Prolactin   Luteinizing hormone   Follicle stimulating hormone   Insulin-like growth factor   Cortisol   ACTH   Sodium   - Total time spent for the visit: 50 min, in precharting, reviewing Dr. Cordelia Pen last note, obtaining medical information from the chart and from the pt., reviewing her  previous labs, images, evaluations, and treatments, reviewing her symptoms, counseling her about her pituitary tumor (please see the discussed topics above), and developing a plan to further investigate and treat it; she had a number of questions which I addressed.  Component     Latest Ref Rng 03/14/2022  T4,Free(Direct)     0.60 - 1.60 ng/dL 0.65   TSH     0.35 - 5.50 uIU/mL 0.71   IGF-I, LC/MS     52 - 328 ng/mL 115   Z-Score (Female)     -2.0 - 2.0 SD -0.5    Prolactin     ng/mL 15.1   Sodium     135 - 145 mEq/L 141   Cortisol, Plasma     ug/dL 7.8   Triiodothyronine,Free,Serum     2.3 - 4.2 pg/mL 2.8   FSH     mIU/ML 6.9   LH     mIU/mL 3.51   C206 ACTH     6 - 50 pg/mL 23   Pituitary labs are all normal.  I would like to repeat her thyroid tests at next visit, to make sure they do not decrease further.  For now, however, no intervention is needed.  Philemon Kingdom, MD PhD [facility] Endocrinology

## 2022-03-06 NOTE — Patient Instructions (Addendum)
Please come back for labs, fasting between 8 and 9 AM.    Try to skip the am dose of ddAVP. If you are doing well, please try to stop the evening dose, also.  Please return in 4 months.

## 2022-03-08 NOTE — Progress Notes (Signed)
Head and Neck Cancer Location of Tumor / Histology: Pituitary Adenoma   Biopsies revealed: 01/04/2022 FINAL MICROSCOPIC DIAGNOSIS:   A. PITUITARY TUMOR, RESECTION:  Pituitary neuroendocrine tumor (adenoma of anterior pituitary gland),  fragmented.  There is no tumor necrosis.  Adjacent strips of normal bone and nasal mucosa.  There are no cytologic features diagnostic of malignancy.    GROSS DESCRIPTION:   Received fresh are portions of soft tan-pink to hemorrhagic tissue  measuring 2.5 x 2.5 x 0.8 cm in aggregate.  There is a 1 cm portion of  bone present.  The specimen is entirely submitted in 4 cassettes  including decalcified bone.  (GRP 01/04/2022   Nutrition Status Yes No Comments  Weight changes? '[x]'$  '[]'$  6-7 pound  Swallowing concerns? '[]'$  '[x]'$    PEG? '[]'$  '[x]'$     Referrals Yes No Comments  Social Work? '[]'$  '[x]'$    Dentistry? '[]'$  '[x]'$    Swallowing therapy? '[]'$  '[x]'$    Nutrition? '[]'$  '[x]'$    Med/Onc? '[]'$  '[x]'$     Safety Issues Yes No Comments  Prior radiation? '[]'$  '[x]'$    Pacemaker/ICD? '[]'$  '[x]'$    Possible current pregnancy? '[]'$  '[x]'$    Is the patient on methotrexate? '[]'$  '[x]'$     Tobacco/Marijuana/Snuff/ETOH use: no  Past/Anticipated interventions by otolaryngology, if any: 02/13/2022 visit n/a  Past/Anticipated interventions by medical oncology, if any:      Current Complaints / other details:   Vitals:   03/14/22 1031  BP: 124/73  Pulse: 74  Resp: 20  Temp: 98.1 F (36.7 C)  SpO2: 99%  Weight: 127.4 kg  Height: '5\' 9"'$  (1.753 m)

## 2022-03-13 ENCOUNTER — Inpatient Hospital Stay: Payer: BC Managed Care – PPO | Attending: Neurological Surgery

## 2022-03-13 DIAGNOSIS — D352 Benign neoplasm of pituitary gland: Secondary | ICD-10-CM | POA: Insufficient documentation

## 2022-03-13 NOTE — Progress Notes (Signed)
Radiation Oncology         [phone] ________________________________  Initial Outpatient Consultation  Name: Beth [name] MRN: [id number]  Date: 03/14/2022  DOB: [birth year]  IN:[facility], [place], NP  Judith Part, MD   REFERRING PHYSICIAN: Judith Part, MD  DIAGNOSIS:    ICD-10-CM   1. Pituitary adenoma ([place])  D35.2       CHIEF COMPLAINT: Here to discuss management of pituitary adenoma  HISTORY OF PRESENT ILLNESS::Beth [name] is a 46 y.o. female who presents today for a opinion concerning radiation therapy in management of her diagnosed pituitary adenoma. About 20 years ago, the patient developed galactorrhea. Around this time, she was noted to have elevated Prolactin levels by her OB. Per the patient, she did not have any follow up imaging performed, but was managed with metformin which she had to stop due to pregnancy 1 month later. In 2013, she saw another OB/GYN for recurrent galactorrhea and high prolactin and she was started on Bromocriptine which she continued until her recent diagnosis.   In recent history, the patient developed blurry vision/floaters and was seen by her optometrist. She also followed up with [facility] endocrinology, Dr. Loanne Drilling, on 10/21/21 for her history of hyperprolactinemia (established care with Dr. Loanne Drilling in 2019). Given her vision changes, Dr. Hale Bogus ordered an MRI brain on 11/12/2021 which demonstrated a large 4.1 x 3.4 by 4.1 cm sellar/suprasellar mass with significant mass effect on the optic chiasm, cavernous sinus invasion, and extension into the sphenoid sinus.   For further evaluation and pre-op discussion, the patient was referred to Dr. Fredric Dine, [facility] ENT, on 12/12/21. Sinus CT performed for pre-operative planning during this visit redemonstrated the suprasellar/selar mass, measuring 4.1 cm. The mass was seen to remodel/erode portions of the sella turcica and extend into the bilateral sphenoid sinuses. CT also  showed partial opacification of the bilateral frontal sinus drainage pathways, mild mucosal thickening versus small mucous retention cyst along the floor of the right maxillary sinus, and obstruction of the maxillary sinus ostium by mucosal thickening and/or secretions.  Accordingly, the patient was referred to neurosurgery and opted to proceed with resectioning of the pituitary tumor on 01/04/22 under the care of Dr. Zada Finders. Pathology from the procedure revealed pituitary neuroendocrine tumor (pituitary adenoma), without evidence of necrosis.   During a post-op follow-up visit with Dr. Fredric Dine on 01/19/22, the patient reported some improvement in her vision since surgery, but endorsed persistent visual difficulties, especially in the right eye, and neck stiffness. She denied copious rhinorrhea. During a post-op endoscopy (and debridement) on 01/26/22, the patient was appreciated to have a thin amount of purulent drainage adjacent to the sphenoid defect. Accordingly, the patient was prescribed Augmentin. Per her most recent post-op visit with Dr. Fredric Dine on 02/13/22, the patient reported significant improvement after completing her oral antibiotics. Endoscopy also showed no evidence of persistent purulent drainage as previously noted. A small amount of scabbing along the nasal septal flap was debrided at this time. Sphenoid defect was also appreciated to be healing normally.  Follow-up MRI of the brain on 02/18/22 showed residual tumor with epicenter at the right cavernous sinus measuring approximately 2.6 x 3.0 x 1.7 cm, and resolution of suprasellar mass effect. MRI also showed abnormal mixed signal and enhancing material throughout both sphenoid sinuses; favored to be postoperative and/or sinus mucosal disease related in etiology.   The patient recently followed up with endocrinology on 03/06/22. Depending on the growth of the remnant tumor, the need to possible radiation  therapy was discussed and  the patient was promptly referred to me for further discussion.    Swallowing issues, if any: none  Weight Changes: none  Pain status: none  Other symptoms: vision changes, history of galactorrhea and elevated prolactin levels   Tobacco history, if any: none  ETOH abuse, if any: none  Prior cancers, if any: none  PREVIOUS RADIATION THERAPY: No  PAST MEDICAL HISTORY:  has a past medical history of Asthma, Chronic pancreatitis ([place]), DDD (degenerative disc disease), lumbosacral, DJD (degenerative joint disease), Headache, Hyperlipidemia, Hypertension, Hypoglycemia, Obesity, Osteoarthritis, Prolactinoma ([place]), Sciatica, and Sleep apnea.    PAST SURGICAL HISTORY: Past Surgical History:  Procedure Laterality Date   CHOLECYSTECTOMY     COLONOSCOPY     CRANIOTOMY N/A 01/04/2022   Procedure: Endonasal endoscopic resection of pituitary tumor;  Surgeon: Judith Part, MD;  Location: [place];  Service: Neurosurgery;  Laterality: N/A;  RM 20   NASAL SINUS SURGERY N/A 01/04/2022   Procedure: ENDOSCOPIC SINUS SURGERY TRANSNASAL PITUITARY REMOVAL WITH NASAL NASOSETTAL FLAP;  Surgeon: Jason Coop, DO;  Location: [facility] OR;  Service: ENT;  Laterality: N/A;    FAMILY HISTORY: family history includes Arthritis in her mother; Asthma in her mother; Bell's palsy in her father; Colon cancer in her mother; Diabetes in her father and mother; Hypertension in her father and mother; Other in her paternal aunt; Thyroid disease in her mother.  SOCIAL HISTORY:  reports that she has never smoked. She has never been exposed to tobacco smoke. She has never used smokeless tobacco. She reports that she does not drink alcohol and does not use drugs.  ALLERGIES: [place], [place] grass, Cedar, Cladosporium cladosporioides, Dust mite extract, Elm bark [ulmus fulva], Molds & smuts, Mugwort, Nettle [nettle (urtica dioica)], Red mulberry allergy skin test, Sorrel-dock mix [sheep sorrel-yellow dock], Timothy grass pollen  allergen, American cockroach, and Mixed ragweed  MEDICATIONS:  Current Outpatient Medications  Medication Sig Dispense Refill   Ascorbic Acid (VITAMIN C) 1000 MG tablet Take 1,000 mg by mouth daily.     baclofen (LIORESAL) 10 MG tablet Take 0.5-1 tablets (5-10 mg total) by mouth 3 (three) times daily as needed for muscle spasms. 30 each 3   benzonatate (TESSALON) 200 MG capsule Take 1 capsule (200 mg total) by mouth 3 (three) times daily as needed for cough. 30 capsule 3   bromocriptine (PARLODEL) 5 MG capsule Take 1 capsule (5 mg total) by mouth daily. (Patient taking differently: Take 2.5 mg by mouth daily.) 90 capsule 3   Butenafine HCl (MENTAX) 1 % cream Apply 1 application topically 2 (two) times daily. (Patient taking differently: Apply 1 application  topically 2 (two) times daily as needed (athlete's foot).) 24 g 1   Cholecalciferol (VITAMIN D3 PO) Take 1 capsule by mouth daily.     Docusate Sodium (COLACE PO) Take 1 capsule by mouth daily.     EPINEPHrine 0.3 mg/0.3 mL IJ SOAJ injection Inject 0.3 mg into the muscle as needed for anaphylaxis.     fenofibrate (TRICOR) 145 MG tablet Take 145 mg by mouth daily.     fluticasone (FLONASE) 50 MCG/ACT nasal spray Place 2 sprays into both nostrils daily. 16 g 3   HYDROcodone-acetaminophen (NORCO/VICODIN) 5-325 MG tablet Take 1 tablet by mouth every 4 (four) hours as needed (pain). 30 tablet 0   levonorgestrel (MIRENA) 20 MCG/24HR IUD 1 each by Intrauterine route once.      Menthol, Topical Analgesic, (BIOFREEZE) 10 % CREA Apply 1 Application topically  daily as needed (pain).     NON FORMULARY Pt uses a cpap nightly     omega-3 fish oil (MAXEPA) 1000 MG CAPS capsule Take 1 capsule (1,000 mg total) by mouth 2 (two) times daily. 180 capsule 1   Pancrelipase, Lip-Prot-Amyl, (CREON) 24000-76000 units CPEP Take 2 capsules by mouth See admin instructions. Take 2 capsules three times daily with food and 1 capsule twice daily with snacks.      pantoprazole (PROTONIX) 20 MG tablet Take 20 mg by mouth daily.     psyllium (REGULOID) 0.52 g capsule Take 0.52 capsules by mouth daily.     rosuvastatin (CRESTOR) 40 MG tablet Take 1 tablet (40 mg total) by mouth daily. 90 tablet 3   Trospium Chloride 60 MG CP24 Take 1 capsule by mouth daily as needed (overactive bladder).     Zinc Sulfate (ZINC 15 PO) Take 15 mg by mouth daily.     No current facility-administered medications for this encounter.    REVIEW OF SYSTEMS:  Notable for that above.   PHYSICAL EXAM:  vitals were not taken for this visit.   General: Alert and oriented, in no acute distress HEENT: Head is normocephalic. Extraocular movements are intact. Oropharynx is notable for ***. Neck: Neck is notable for *** Heart: Regular in rate and rhythm with no murmurs, rubs, or gallops. Chest: Clear to auscultation bilaterally, with no rhonchi, wheezes, or rales. Abdomen: Soft, nontender, nondistended, with no rigidity or guarding. Extremities: No cyanosis or edema. Lymphatics: see Neck Exam Skin: No concerning lesions. Musculoskeletal: symmetric strength and muscle tone throughout. Neurologic: Cranial nerves II through XII are grossly intact. No obvious focalities. Speech is fluent. Coordination is intact. Psychiatric: Judgment and insight are intact. Affect is appropriate.   ECOG = ***  0 - Asymptomatic (Fully active, able to carry on all predisease activities without restriction)  1 - Symptomatic but completely ambulatory (Restricted in physically strenuous activity but ambulatory and able to carry out work of a light or sedentary nature. For example, light housework, office work)  2 - Symptomatic, <50% in bed during the day (Ambulatory and capable of all self care but unable to carry out any work activities. Up and about more than 50% of waking hours)  3 - Symptomatic, >50% in bed, but not bedbound (Capable of only limited self-care, confined to bed or chair 50% or more of  waking hours)  4 - Bedbound (Completely disabled. Cannot carry on any self-care. Totally confined to bed or chair)  5 - Death   Eustace Pen MM, Creech RH, Tormey DC, et al. [phone]). "Toxicity and response criteria of the [facility]". [place] Oncol. 5 (6): 649-55   LABORATORY DATA:  Lab Results  Component Value Date   WBC 11.8 (H) 01/05/2022   HGB 11.4 (L) 01/05/2022   HCT 34.2 (L) 01/05/2022   MCV 82.8 01/05/2022   PLT 306 01/05/2022   CMP     Component Value Date/Time   NA 140 01/13/2022 0500   NA 141 12/16/2021 0946   K 3.8 01/13/2022 0500   CL 107 01/13/2022 0500   CO2 26 01/13/2022 0500   GLUCOSE 105 (H) 01/13/2022 0500   BUN 8 01/13/2022 0500   BUN 11 12/16/2021 0946   CREATININE 0.81 01/13/2022 0500   CREATININE 0.86 10/21/2021 1610   CALCIUM 8.8 (L) 01/13/2022 0500   PROT 6.8 12/16/2021 0946   ALBUMIN 3.0 (L) 01/13/2022 0500   ALBUMIN 4.1 12/16/2021 0946   AST  17 12/16/2021 0946   ALT 17 12/16/2021 0946   ALKPHOS 114 12/16/2021 0946   BILITOT <0.2 12/16/2021 0946   GFRNONAA >60 01/13/2022 0500   GFRAA 98 01/02/2020 0955      Lab Results  Component Value Date   TSH 0.026 (L) 01/09/2022     RADIOGRAPHY: MR BRAIN W WO CONTRAST  Result Date: 02/20/2022 CLINICAL DATA:  46 year old female status post endonasal, endoscopic resection of pituitary tumor in July. Pathology reveals pituitary adenoma. Subsequent encounter. EXAM: MRI HEAD WITHOUT AND WITH CONTRAST TECHNIQUE: Multiplanar, multiecho pulse sequences of the brain and surrounding structures were obtained without and with intravenous contrast. CONTRAST:  80m GADAVIST GADOBUTROL 1 MMOL/ML IV SOLN COMPARISON:  Preoperative MRI 11/12/2021. FINDINGS: Brain: Pituitary details are below. No superimposed restricted diffusion to suggest acute infarction. No midline shift, ventriculomegaly, extra-axial collection or acute intracranial hemorrhage. Cervicomedullary junction is within normal limits.  Stable, normal gray and white matter signal throughout the brain. No dural thickening. And no abnormal gray or white matter enhancement. Vascular: Major intracranial vascular flow voids are stable. Following contrast the major dural venous sinuses are enhancing and appear to be patent. Skull and upper cervical spine: Interval postoperative changes to the subtle skull base. Preserved normal marrow signal in the lower clivus, calvarium. Negative visible cervical spine and spinal cord. Sinuses/Orbits: Resolved suprasellar mass effect including on the optic chiasm (series 3, image 14). Intraorbital soft tissues remain stable and within normal limits. Postoperative changes to the posterior ethmoid and sphenoid sinuses, see additional details below. Mastoids remain clear. Visible internal auditory structures appear normal. Other: Interval intra-sellar and suprasellar resection of tumor. Resolve suprasellar mass effect. Hypothalamus is within normal limits. Infundibulum is now visible, with some downward traction (series 12, image 8). Infundibulum is also deviated into the left sella turcica (series 14, image 8). Subjacent fairly homogeneously enhancing soft tissue there is likely normal pituitary. But this is inseparable from heterogeneously enhancing residual tumor which tracks posteriorly and laterally from the sella turcica into and across the right cavernous sinus as before. Confluent residual tumor is estimated at 26 (series 12, image 4) x 30 x 17 mm (images 6 and 7 of series 14), AP by transverse by CC. Right IAC siphon is engulfed as left-side/intra sellar tumor margin with the residual pituitary is difficult to discern on some images. But the left cavernous sinus appears to remain spared. Superimposed abnormal signal and enhancement in the postoperative sphenoid sinuses. In the midline and to the right of midline most of this is intrinsically T1 hyperintense (series 9, image 10) compatible with postoperative fat  and/or inspissated secretions. Rim enhancing, but centrally T1 and T2 hypointense abnormality throughout the central and left sphenoid sinus appears fairly distinct from the residual tumor in the cavernous sinus and is probably paranasal sinus mucosal disease and/or granulation tissue with inspissated secretions. IMPRESSION: 1. Interval debulking of intra-sellar and suprasellar pituitary adenoma. Resolved suprasellar mass effect. Residual tumor with epicenter at the right cavernous sinus is approximately 2.6 x 3.0 x 1.7 cm. See series 14, images 6 and 7. Left cavernous sinus seems spared. Abnormal mixed signal and enhancing material throughout both sphenoid sinuses is favored to be postoperative and/or sinus mucosal disease related. Attention is directed on follow-up. 2. No new intracranial abnormality. Electronically Signed   By: HGenevie AnnM.D.   On: 02/20/2022 10:19      IMPRESSION/PLAN:  This is a delightful patient with head and neck cancer. I *** recommend radiotherapy for this patient.  We discussed the potential risks, benefits, and side effects of radiotherapy. We talked in detail about acute and late effects. We discussed that some of the most bothersome acute effects may be mucositis, dysgeusia, salivary changes, skin irritation, hair loss, dehydration, weight loss and fatigue. We talked about late effects which include but are not necessarily limited to dysphagia, hypothyroidism, nerve injury, vascular injury, spinal cord injury, xerostomia, trismus, neck edema, and potential injury to any of the tissues in the head and neck region. No guarantees of treatment were given. A consent form was signed and placed in the patient's medical record. The patient is enthusiastic about proceeding with treatment. I look forward to participating in the patient's care.    Simulation (treatment planning) will take place ***  We also discussed that the treatment of head and neck cancer is a multidisciplinary  process to maximize treatment outcomes and quality of life. For this reason the following referrals have been or will be made:  *** Medical oncology to discuss chemotherapy   *** Dentistry for dental evaluation, possible extractions in the radiation fields, and /or advice on reducing risk of cavities, osteoradionecrosis, or other oral issues.  *** Nutritionist for nutrition support during and after treatment.  *** Speech language pathology for swallowing and/or speech therapy.  *** Social work for social support.   *** Physical therapy due to risk of lymphedema in neck and deconditioning.  *** Baseline labs including TSH.  On date of service, in total, I spent *** minutes on this encounter. Patient was seen in person.  __________________________________________   Eppie Gibson, MD  This document serves as a record of services personally performed by Eppie Gibson, MD. It was created on her behalf by Roney Mans, a trained medical scribe. The creation of this record is based on the scribe's personal observations and the provider's statements to them. This document has been checked and approved by the attending provider.

## 2022-03-14 ENCOUNTER — Ambulatory Visit
Admission: RE | Admit: 2022-03-14 | Discharge: 2022-03-14 | Disposition: A | Discharge status: Home / Self Care | Payer: BC Managed Care – PPO | Source: Ambulatory Visit | Attending: Radiation Oncology | Admitting: Radiation Oncology

## 2022-03-14 ENCOUNTER — Other Ambulatory Visit (INDEPENDENT_AMBULATORY_CARE_PROVIDER_SITE_OTHER): Payer: BC Managed Care – PPO

## 2022-03-14 ENCOUNTER — Other Ambulatory Visit: Payer: Self-pay

## 2022-03-14 ENCOUNTER — Other Ambulatory Visit: Payer: Self-pay | Admitting: Radiation Therapy

## 2022-03-14 ENCOUNTER — Encounter: Payer: Self-pay | Admitting: Radiation Oncology

## 2022-03-14 VITALS — BP 124/73 | HR 74 | Temp 98.1°F | Resp 20 | Ht 69.0 in | Wt 280.8 lb

## 2022-03-14 DIAGNOSIS — Z800 Family history of malignant neoplasm of digestive organs: Secondary | ICD-10-CM | POA: Insufficient documentation

## 2022-03-14 DIAGNOSIS — D352 Benign neoplasm of pituitary gland: Secondary | ICD-10-CM | POA: Diagnosis not present

## 2022-03-14 DIAGNOSIS — Z79899 Other long term (current) drug therapy: Secondary | ICD-10-CM | POA: Diagnosis not present

## 2022-03-14 DIAGNOSIS — K861 Other chronic pancreatitis: Secondary | ICD-10-CM | POA: Insufficient documentation

## 2022-03-14 DIAGNOSIS — E232 Diabetes insipidus: Secondary | ICD-10-CM | POA: Diagnosis not present

## 2022-03-14 DIAGNOSIS — G4730 Sleep apnea, unspecified: Secondary | ICD-10-CM | POA: Insufficient documentation

## 2022-03-14 DIAGNOSIS — R220 Localized swelling, mass and lump, head: Secondary | ICD-10-CM | POA: Diagnosis not present

## 2022-03-14 DIAGNOSIS — I10 Essential (primary) hypertension: Secondary | ICD-10-CM | POA: Insufficient documentation

## 2022-03-14 DIAGNOSIS — J45909 Unspecified asthma, uncomplicated: Secondary | ICD-10-CM | POA: Diagnosis not present

## 2022-03-14 DIAGNOSIS — M1990 Unspecified osteoarthritis, unspecified site: Secondary | ICD-10-CM | POA: Diagnosis not present

## 2022-03-14 DIAGNOSIS — R7989 Other specified abnormal findings of blood chemistry: Secondary | ICD-10-CM

## 2022-03-14 DIAGNOSIS — E221 Hyperprolactinemia: Secondary | ICD-10-CM | POA: Diagnosis not present

## 2022-03-14 DIAGNOSIS — E785 Hyperlipidemia, unspecified: Secondary | ICD-10-CM | POA: Insufficient documentation

## 2022-03-14 DIAGNOSIS — E669 Obesity, unspecified: Secondary | ICD-10-CM | POA: Diagnosis not present

## 2022-03-14 LAB — SODIUM: Sodium: 141 mEq/L (ref 135–145)

## 2022-03-14 LAB — TSH: TSH: 0.71 u[IU]/mL (ref 0.35–5.50)

## 2022-03-14 LAB — T4, FREE: Free T4: 0.65 ng/dL (ref 0.60–1.60)

## 2022-03-14 LAB — T3, FREE: T3, Free: 2.8 pg/mL (ref 2.3–4.2)

## 2022-03-14 LAB — CORTISOL: Cortisol, Plasma: 7.8 ug/dL

## 2022-03-14 LAB — FOLLICLE STIMULATING HORMONE: FSH: 6.9 m[IU]/mL

## 2022-03-14 LAB — LUTEINIZING HORMONE: LH: 3.51 m[IU]/mL

## 2022-03-15 ENCOUNTER — Encounter: Payer: Self-pay | Admitting: Internal Medicine

## 2022-03-16 ENCOUNTER — Ambulatory Visit (INDEPENDENT_AMBULATORY_CARE_PROVIDER_SITE_OTHER): Payer: Self-pay | Admitting: *Deleted

## 2022-03-16 NOTE — Telephone Encounter (Signed)
  Chief Complaint: Left kneecap is discolored. Symptoms: above Frequency: Noticed yesterday Pertinent Negatives: Patient denies pain, itching or rash.   Disposition: '[]'$ ED /'[]'$ Urgent Care (no appt availability in office) / '[]'$ Appointment(In office/virtual)/ '[x]'$  [facility]/ '[]'$ Home Care/ '[]'$ Refused Recommended Disposition /'[]'$ [facility]/ '[]'$  Follow-up with PCP Additional Notes: No appts available so I recommended a Virtual visit via MyChart however she has virtual visits offered through her insurance co. That will not cost her anything so she has decided to use their services.

## 2022-03-16 NOTE — Telephone Encounter (Signed)
Message from Sharene Skeans sent at 03/16/2022  9:23 AM EDT  Summary: knee discoloration   Pt called to get an appt for a area on her knee that is discolored / no itching or pain / please advise           Call History   Type Contact Phone/Fax User  03/16/2022 09:23 AM EDT Phone (Incoming) [name], Beth (Self) [phone] Jerilynn Mages) Alanda Slim   Reason for Disposition  Looks like a boil, infected sore, or deep ulcer    Discoloration of kneecap of left knee.  Answer Assessment - Initial Assessment Questions 1. LOCATION and RADIATION: "Where is the pain located?"      I have discoloration I noticed yesterday.   No rash or itching.   I had brain surgery.    2. QUALITY: "What does the pain feel like?"  (e.g., sharp, dull, aching, burning)     It covers the left kneecap area.   I don't know what it going on with it. 3. SEVERITY: "How bad is the pain?" "What does it keep you from doing?"   (Scale 1-10; or mild, moderate, severe)   -  MILD (1-3): doesn't interfere with normal activities    -  MODERATE (4-7): interferes with normal activities (e.g., work or school) or awakens from sleep, limping    -  SEVERE (8-10): excruciating pain, unable to do any normal activities, unable to walk     No pain 4. ONSET: "When did the pain start?" "Does it come and go, or is it there all the time?"     Yesterday 5. RECURRENT: "Have you had this pain before?" If Yes, ask: "When, and what happened then?"     No 6. SETTING: "Has there been any recent work, exercise or other activity that involved that part of the body?"      No 7. AGGRAVATING FACTORS: "What makes the knee pain worse?" (e.g., walking, climbing stairs, running)     No 8. ASSOCIATED SYMPTOMS: "Is there any swelling or redness of the knee?"     No 9. OTHER SYMPTOMS: "Do you have any other symptoms?" (e.g., chest pain, difficulty breathing, fever, calf pain)     No 10. PREGNANCY: "Is there any chance you are pregnant?" "When was your last  menstrual period?"       Not asked  Protocols used: Knee Pain-A-AH

## 2022-03-19 LAB — PROLACTIN: Prolactin: 15.1 ng/mL

## 2022-03-19 LAB — INSULIN-LIKE GROWTH FACTOR
IGF-I, LC/MS: 115 ng/mL (ref 52–328)
Z-Score (Female): -0.5 SD (ref ?–2.0)

## 2022-03-19 LAB — ACTH: C206 ACTH: 23 pg/mL (ref 6–50)

## 2022-03-20 DIAGNOSIS — Z4889 Encounter for other specified surgical aftercare: Secondary | ICD-10-CM | POA: Diagnosis not present

## 2022-03-21 ENCOUNTER — Other Ambulatory Visit: Payer: Self-pay

## 2022-03-21 ENCOUNTER — Other Ambulatory Visit: Payer: Self-pay | Admitting: Radiation Therapy

## 2022-03-21 ENCOUNTER — Inpatient Hospital Stay (HOSPITAL_BASED_OUTPATIENT_CLINIC_OR_DEPARTMENT_OTHER): Payer: BC Managed Care – PPO | Admitting: Internal Medicine

## 2022-03-21 VITALS — BP 132/86 | HR 69 | Temp 99.2°F | Resp 16 | Ht 69.5 in | Wt 281.8 lb

## 2022-03-21 DIAGNOSIS — D352 Benign neoplasm of pituitary gland: Secondary | ICD-10-CM | POA: Diagnosis not present

## 2022-03-21 NOTE — Progress Notes (Signed)
[facility] [place], [place] [address] [phone]   New Patient Evaluation  Date of Service: 03/21/22 Patient Name: Beth [name] Patient MRN: [id number] Patient DOB: 1975/07/29 Provider: Ventura Sellers, MD  Identifying Statement:  Beth [name] is a 46 y.o. female with  pituitary   adenoma  who presents for initial consultation and evaluation.    Referring Provider: [name], NP [address] [place],  [place] [address]  Oncologic History: 01/04/22: Trans-sphenoidal debulking resection of macroadenoma with Dr. Zada Finders   Biomarkers:  MGMT Unknown.  IDH 1/2 Unknown.  EGFR Unknown  TERT Unknown   History of Present Illness: The patient's records from the referring physician were obtained and reviewed and the patient interviewed to confirm this HPI.  Beth [name] presents today to review her pituitary adenoma.  She initially presented to medical attention "maybe 20 years ago" with galactorrhea, with apparently quite elevated prolactin levels.  She was treated with bromocriptine, per patient, without any CNS imaging performed.  This did lead to stabilization of symptoms.  This year, she underwent evaluation with optometrist which demonstrated bitemporal visual field deficit.  Referral was made for endocrinology, who performed CNS imaging, demonstrating very large sellar based mass.  She then underwent trans-sphenoidal resection in July 2023 with Dr. Zada Finders; tumor was debulked, pathology confirmed pituitary adenoma.  Surgery led to improvement in peripheral vision on the right side only.  Surgical site infection post-op was treated with Augmentin, now resolved.    Medications: Current Outpatient Medications on File Prior to Visit  Medication Sig Dispense Refill   Albuterol (PROVENTIL IN) Inhale into the lungs.     Ascorbic Acid (VITAMIN C) 1000 MG tablet Take 1,000 mg by mouth daily. (Patient not  taking: Reported on 03/14/2022)     baclofen (LIORESAL) 10 MG tablet Take 0.5-1 tablets (5-10 mg total) by mouth 3 (three) times daily as needed for muscle spasms. (Patient not taking: Reported on 03/14/2022) 30 each 3   benzonatate (TESSALON) 200 MG capsule Take 1 capsule (200 mg total) by mouth 3 (three) times daily as needed for cough. 30 capsule 3   bromocriptine (PARLODEL) 5 MG capsule Take 1 capsule (5 mg total) by mouth daily. (Patient not taking: Reported on 03/14/2022) 90 capsule 3   Butenafine HCl (MENTAX) 1 % cream Apply 1 application topically 2 (two) times daily. (Patient not taking: Reported on 03/14/2022) 24 g 1   Cholecalciferol (VITAMIN D3 PO) Take 1 capsule by mouth daily.     Docusate Sodium (COLACE PO) Take 1 capsule by mouth daily.     EPINEPHrine 0.3 mg/0.3 mL IJ SOAJ injection Inject 0.3 mg into the muscle as needed for anaphylaxis. (Patient not taking: Reported on 03/14/2022)     fenofibrate (TRICOR) 145 MG tablet Take 145 mg by mouth daily.     fluticasone (FLONASE) 50 MCG/ACT nasal spray Place 2 sprays into both nostrils daily. 16 g 3   HYDROcodone-acetaminophen (NORCO/VICODIN) 5-325 MG tablet Take 1 tablet by mouth every 4 (four) hours as needed (pain). 30 tablet 0   levonorgestrel (MIRENA) 20 MCG/24HR IUD 1 each by Intrauterine route once.      Menthol, Topical Analgesic, (BIOFREEZE) 10 % CREA Apply 1 Application topically daily as needed (pain).     NON FORMULARY Pt uses a cpap nightly (Patient not taking: Reported on 03/14/2022)     omega-3 fish oil (MAXEPA) 1000 MG CAPS capsule Take 1 capsule (1,000 mg total) by  mouth 2 (two) times daily. (Patient not taking: Reported on 03/14/2022) 180 capsule 1   Pancrelipase, Lip-Prot-Amyl, (CREON) 24000-76000 units CPEP Take 2 capsules by mouth See admin instructions. Take 2 capsules three times daily with food and 1 capsule twice daily with snacks.     pantoprazole (PROTONIX) 20 MG tablet Take 20 mg by mouth daily.     psyllium  (REGULOID) 0.52 g capsule Take 0.52 capsules by mouth daily.     rosuvastatin (CRESTOR) 40 MG tablet Take 1 tablet (40 mg total) by mouth daily. 90 tablet 3   Trospium Chloride 60 MG CP24 Take 1 capsule by mouth daily as needed (overactive bladder). (Patient not taking: Reported on 03/14/2022)     Zinc Sulfate (ZINC 15 PO) Take 15 mg by mouth daily.     No current facility-administered medications on file prior to visit.    Allergies:  Allergies  Allergen Reactions   Bahia Hives, Shortness Of Breath and Swelling    Found through allergy testing, not sure which item caused the shortness of breath   [place] Grass Hives, Shortness Of Breath and Swelling    Found through allergy testing, not sure which item caused the shortness of breath   SYSCO, Shortness Of Breath and Swelling    Found through allergy testing, not sure which item caused the shortness of breath   Cladosporium Cladosporioides Hives, Shortness Of Breath and Swelling    Found through allergy testing, not sure which item caused the shortness of breath   Dust Mite Extract Hives, Shortness Of Breath and Swelling    Found through allergy testing, not sure which item caused the shortness of breath   Elm Bark [Ulmus Fulva] Hives, Shortness Of Breath and Swelling    Found through allergy testing, not sure which item caused the shortness of breath   Molds & Smuts Hives, Shortness Of Breath and Swelling    Found through allergy testing, not sure which item caused the shortness of breath   Mugwort Hives, Shortness Of Breath and Swelling    Found through allergy testing, not sure which item caused the shortness of breath   Nettle [Nettle (Urtica Dioica)] Hives, Shortness Of Breath and Swelling   Red Mulberry Allergy Skin Test Hives, Shortness Of Breath and Swelling    Found through allergy testing, not sure which item caused the shortness of breath   Sorrel-Dock Mix [Sheep Sorrel-Yellow Dock] Hives, Shortness Of Breath and Swelling     Found through allergy testing, not sure which item caused the shortness of breath   Timothy Grass Pollen Allergen Hives, Shortness Of Breath and Swelling    Found through allergy testing, not sure which item caused the shortness of breath   American Cockroach    Mixed Ragweed    Past Medical History:  Past Medical History:  Diagnosis Date   Asthma    Chronic pancreatitis (HCC)    DDD (degenerative disc disease), lumbosacral    DJD (degenerative joint disease)    Headache    Hyperlipidemia    Hypertension    Hypoglycemia    Obesity    Osteoarthritis    Prolactinoma ([facility])    Sciatica    Sleep apnea    Past Surgical History:  Past Surgical History:  Procedure Laterality Date   CHOLECYSTECTOMY     COLONOSCOPY     CRANIOTOMY N/A 01/04/2022   Procedure: Endonasal endoscopic resection of pituitary tumor;  Surgeon: Judith Part, MD;  Location: [facility];  Service: Neurosurgery;  Laterality: N/A;  RM 20   NASAL SINUS SURGERY N/A 01/04/2022   Procedure: ENDOSCOPIC SINUS SURGERY TRANSNASAL PITUITARY REMOVAL WITH NASAL NASOSETTAL FLAP;  Surgeon: Jason Coop, DO;  Location: [facility];  Service: ENT;  Laterality: N/A;   Social History:  Social History   Socioeconomic History   Marital status: Divorced    Spouse name: Not on file   Number of children: Not on file   Years of education: Not on file   Highest education level: Not on file  Occupational History   Not on file  Tobacco Use   Smoking status: Never    Passive exposure: Never   Smokeless tobacco: Never  Vaping Use   Vaping Use: Never used  Substance and Sexual Activity   Alcohol use: Never   Drug use: Never   Sexual activity: Yes  Other Topics Concern   Not on file  Social History Narrative   Not on file   Social Determinants of Health   Financial Resource Strain: Not on file  Food Insecurity: Not on file  Transportation Needs: Not on file  Physical Activity: Not on file  Stress: Not on file  Social  Connections: Not on file  Intimate Partner Violence: Not on file   Family History:  Family History  Problem Relation Age of Onset   Other Paternal Aunt        pituitary surgery   Hypertension Mother    Diabetes Mother    Colon cancer Mother    Arthritis Mother    Thyroid disease Mother    Asthma Mother    Diabetes Father    Hypertension Father    Bell's palsy Father     Review of Systems: Constitutional: Doesn't report fevers, chills or abnormal weight loss Eyes: Doesn't report blurriness of vision Ears, nose, mouth, throat, and face: Doesn't report sore throat Respiratory: Doesn't report cough, dyspnea or wheezes Cardiovascular: Doesn't report palpitation, chest discomfort  Gastrointestinal:  Doesn't report nausea, constipation, diarrhea GU: Doesn't report incontinence Skin: Doesn't report skin rashes Neurological: Per HPI Musculoskeletal: Doesn't report joint pain Behavioral/Psych: Doesn't report anxiety  Physical Exam: Vitals:   03/21/22 0936  BP: 132/86  Pulse: 69  Resp: 16  Temp: 99.2 F (37.3 C)  SpO2: 97%   KPS: 90. General: Alert, cooperative, pleasant, in no acute distress Head: Normal EENT: No conjunctival injection or scleral icterus.  Lungs: Resp effort normal Cardiac: Regular rate Abdomen: Non-distended abdomen Skin: No rashes cyanosis or petechiae. Extremities: No clubbing or edema  Neurologic Exam: Mental Status: Awake, alert, attentive to examiner. Oriented to self and environment. Language is fluent with intact comprehension.  Cranial Nerves: Visual acuity is grossly normal. Left temporal field hemianopia. Extra-ocular movements intact. No ptosis. Face is symmetric Motor: Tone and bulk are normal. Power is full in both arms and legs. Reflexes are symmetric, no pathologic reflexes present.  Sensory: Intact to light touch Gait: Normal.   Labs: I have reviewed the data as listed    Component Value Date/Time   NA 141 03/14/2022 0937   NA  141 12/16/2021 0946   K 3.8 01/13/2022 0500   CL 107 01/13/2022 0500   CO2 26 01/13/2022 0500   GLUCOSE 105 (H) 01/13/2022 0500   BUN 8 01/13/2022 0500   BUN 11 12/16/2021 0946   CREATININE 0.81 01/13/2022 0500   CREATININE 0.86 10/21/2021 1610   CALCIUM 8.8 (L) 01/13/2022 0500   PROT 6.8 12/16/2021 0946   ALBUMIN 3.0 (L) 01/13/2022 0500  ALBUMIN 4.1 12/16/2021 0946   AST 17 12/16/2021 0946   ALT 17 12/16/2021 0946   ALKPHOS 114 12/16/2021 0946   BILITOT <0.2 12/16/2021 0946   GFRNONAA >60 01/13/2022 0500   GFRAA 98 01/02/2020 0955   Lab Results  Component Value Date   WBC 11.8 (H) 01/05/2022   NEUTROABS 1.9 12/16/2021   HGB 11.4 (L) 01/05/2022   HCT 34.2 (L) 01/05/2022   MCV 82.8 01/05/2022   PLT 306 01/05/2022    Imaging:   Pathology: SURGICAL PATHOLOGY  CASE: [id number]  PATIENT: Beth [name]  Surgical Pathology Report   Clinical History: pituitary adenoma (cm)   FINAL MICROSCOPIC DIAGNOSIS:   A. PITUITARY TUMOR, RESECTION:  Pituitary neuroendocrine tumor (adenoma of anterior pituitary gland),  fragmented.  There is no tumor necrosis.  Adjacent strips of normal bone and nasal mucosa.  There are no cytologic features diagnostic of malignancy.   GROSS DESCRIPTION:   Received fresh are portions of soft tan-pink to hemorrhagic tissue  measuring 2.5 x 2.5 x 0.8 cm in aggregate.  There is a 1 cm portion of  bone present.  The specimen is entirely submitted in 4 cassettes  including decalcified bone.  [facility] 01/04/2022)   Final Diagnosis performed by Unknown Jim, MD.   Electronically signed  01/05/2022    Assessment/Plan Pituitary adenoma [facility])  We appreciate the opportunity to participate in the care of Beth [name].  She presents today with clinical, histologic and radiographic syndrome consistent with macroadenoma.  This may have presented initially as secreting prolactinoma, but significant tumor growth despite dopaminergic therapy and current  normal prolactin levels are less supportive.    She underwent surgery which was successful in debulking the sellar portion of the mass and improving visual function.    We discussed potential role for chemotherapy today.  Due to safety of fractionated SRS with current tumor burden, we will withhold systemic therapy.  This can be reserved for recurrent or refractory disease.  Plan will be to proceed with SRS 25/5 with Dr. Isidore Moos following RT treatment planning MRI in November.  She will continue to follow with Dr. Renne Crigler for endocrine labs and exogenous hormones as needed.  Screening for potential clinical trials was performed and discussed using eligibility criteria for active protocols at [facility], loco-regional tertiary centers, as well as national database available on [email or web address].com.    The patient is not a candidate for a research protocol at this time due to no suitable study identified.   We spent twenty additional minutes teaching regarding the natural history, biology, and historical experience in the treatment of brain tumors. We then discussed in detail the current recommendations for therapy focusing on the mode of administration, mechanism of action, anticipated toxicities, and quality of life issues associated with this plan. We also provided teaching sheets for the patient to take home as an additional resource.  We ask that Beth [name] return to clinic as needed following radiation therapy.  All questions were answered. The patient knows to call the clinic with any problems, questions or concerns. No barriers to learning were detected.  The total time spent in the encounter was 45 minutes and more than 50% was on counseling and review of test results   Ventura Sellers, MD Medical Director of Neuro-Oncology [facility] [place] 03/21/22 9:30 AM

## 2022-03-22 ENCOUNTER — Other Ambulatory Visit: Payer: Self-pay | Admitting: Radiation Therapy

## 2022-03-22 DIAGNOSIS — D352 Benign neoplasm of pituitary gland: Secondary | ICD-10-CM

## 2022-03-28 ENCOUNTER — Ambulatory Visit (INDEPENDENT_AMBULATORY_CARE_PROVIDER_SITE_OTHER): Payer: BC Managed Care – PPO | Admitting: Primary Care

## 2022-03-28 ENCOUNTER — Encounter (INDEPENDENT_AMBULATORY_CARE_PROVIDER_SITE_OTHER): Payer: Self-pay | Admitting: Primary Care

## 2022-03-28 VITALS — BP 124/86 | HR 74 | Resp 16 | Wt 276.4 lb

## 2022-03-28 DIAGNOSIS — F329 Major depressive disorder, single episode, unspecified: Secondary | ICD-10-CM | POA: Diagnosis not present

## 2022-03-28 DIAGNOSIS — L559 Sunburn, unspecified: Secondary | ICD-10-CM | POA: Diagnosis not present

## 2022-03-28 DIAGNOSIS — D352 Benign neoplasm of pituitary gland: Secondary | ICD-10-CM

## 2022-03-28 DIAGNOSIS — E785 Hyperlipidemia, unspecified: Secondary | ICD-10-CM

## 2022-03-28 NOTE — Progress Notes (Unsigned)
[place]  Beth [name], is a 46 y.o. female  UVO:[id number]  [id number]:[id number]  DOB - [birth year]  Chief Complaint  Patient presents with   Skin Discoloration    Left knee Noticed it 2 weeks ago Denies pain  Denies any falls        Subjective:   Beth [name] is a 46 y.o. female here today for a follow up visit. S/P    endonasal endoscopic resection of pituitary tumor and endoscopic sinus surgery transnasal pituitary mass removal with nasal  NASOSETTAL FLAP  continues to have headaches, fatigue, joint pain, unsteady gait and incontinence urinary and shortness of breath . A lot of these systems are continued from [place].  Patient has  No chest pain, No abdominal pain - No Nausea, No new weakness tingling or numbness, No Cough.  Patient is a little disappointed and depressed that the entire pituitary tumor was not able to be removed.  She has a follow-up appointment to determine her next steps.  She previously had alarming cholesterol levels consulted with Dr. Debara Pickett after surgery repeat labs to determine if the pituitary tumor was the cause of the abnormal cholesterol levels.  She will be getting fasting labs today.  After review treatment will be determined. No problems updated.  Allergies  Allergen Reactions   Bahia Hives, Shortness Of Breath and Swelling    Found through allergy testing, not sure which item caused the shortness of breath   [place] Grass Hives, Shortness Of Breath and Swelling    Found through allergy testing, not sure which item caused the shortness of breath   SYSCO, Shortness Of Breath and Swelling    Found through allergy testing, not sure which item caused the shortness of breath   Cladosporium Cladosporioides Hives, Shortness Of Breath and Swelling    Found through allergy testing, not sure which item caused the shortness of breath   Dust Mite Extract Hives, Shortness Of Breath and Swelling    Found through allergy  testing, not sure which item caused the shortness of breath   Elm Bark [Ulmus Fulva] Hives, Shortness Of Breath and Swelling    Found through allergy testing, not sure which item caused the shortness of breath   Molds & Smuts Hives, Shortness Of Breath and Swelling    Found through allergy testing, not sure which item caused the shortness of breath   Mugwort Hives, Shortness Of Breath and Swelling    Found through allergy testing, not sure which item caused the shortness of breath   Nettle [Nettle (Urtica Dioica)] Hives, Shortness Of Breath and Swelling   Red Mulberry Allergy Skin Test Hives, Shortness Of Breath and Swelling    Found through allergy testing, not sure which item caused the shortness of breath   Sorrel-Dock Mix [Sheep Sorrel-Yellow Dock] Hives, Shortness Of Breath and Swelling    Found through allergy testing, not sure which item caused the shortness of breath   Timothy Grass Pollen Allergen Hives, Shortness Of Breath and Swelling    Found through allergy testing, not sure which item caused the shortness of breath   American Cockroach    Mixed Ragweed     Past Medical History:  Diagnosis Date   Asthma    Chronic pancreatitis (HCC)    DDD (degenerative disc disease), lumbosacral    DJD (degenerative joint disease)    Headache    Hyperlipidemia    Hypertension    Hypoglycemia    Obesity  Osteoarthritis    Prolactinoma ([place])    Sciatica    Sleep apnea     Current Outpatient Medications on File Prior to Visit  Medication Sig Dispense Refill   Albuterol (PROVENTIL IN) Inhale into the lungs.     Ascorbic Acid (VITAMIN C) 1000 MG tablet Take 1,000 mg by mouth daily. (Patient not taking: Reported on 03/21/2022)     baclofen (LIORESAL) 10 MG tablet Take 0.5-1 tablets (5-10 mg total) by mouth 3 (three) times daily as needed for muscle spasms. (Patient not taking: Reported on 03/14/2022) 30 each 3   benzonatate (TESSALON) 200 MG capsule Take 1 capsule (200 mg total) by  mouth 3 (three) times daily as needed for cough. (Patient not taking: Reported on 03/21/2022) 30 capsule 3   bromocriptine (PARLODEL) 5 MG capsule Take 1 capsule (5 mg total) by mouth daily. (Patient taking differently: Take 2.5 mg by mouth daily.) 90 capsule 3   Butenafine HCl (MENTAX) 1 % cream Apply 1 application topically 2 (two) times daily. 24 g 1   Cholecalciferol (VITAMIN D3 PO) Take 1 capsule by mouth daily.     desmopressin (DDAVP) 0.1 MG tablet Take 1 tablet by mouth 2 (two) times daily. (Patient not taking: Reported on 03/21/2022)     Docusate Sodium (COLACE PO) Take 1 capsule by mouth daily.     EPINEPHrine 0.3 mg/0.3 mL IJ SOAJ injection Inject 0.3 mg into the muscle as needed for anaphylaxis. (Patient not taking: Reported on 03/14/2022)     fluticasone (FLONASE) 50 MCG/ACT nasal spray Place 2 sprays into both nostrils daily. 16 g 3   HYDROcodone-acetaminophen (NORCO/VICODIN) 5-325 MG tablet Take 1 tablet by mouth every 4 (four) hours as needed (pain). 30 tablet 0   levonorgestrel (MIRENA) 20 MCG/24HR IUD 1 each by Intrauterine route once.      Menthol, Topical Analgesic, (BIOFREEZE) 10 % CREA Apply 1 Application topically daily as needed (pain). (Patient not taking: Reported on 03/21/2022)     NON FORMULARY Pt uses a cpap nightly     omega-3 fish oil (MAXEPA) 1000 MG CAPS capsule Take 1 capsule (1,000 mg total) by mouth 2 (two) times daily. 180 capsule 1   Pancrelipase, Lip-Prot-Amyl, (CREON) 24000-76000 units CPEP Take 2 capsules by mouth See admin instructions. Take 2 capsules three times daily with food and 1 capsule twice daily with snacks.     pantoprazole (PROTONIX) 20 MG tablet Take 20 mg by mouth daily.     psyllium (REGULOID) 0.52 g capsule Take 0.52 capsules by mouth daily.     rosuvastatin (CRESTOR) 40 MG tablet Take 1 tablet (40 mg total) by mouth daily. 90 tablet 3   Trospium Chloride 60 MG CP24 Take 1 capsule by mouth daily as needed (overactive bladder). (Patient not  taking: Reported on 03/14/2022)     Zinc Sulfate (ZINC 15 PO) Take 15 mg by mouth daily.     No current facility-administered medications on file prior to visit.    Objective:   Vitals:   03/28/22 1507  BP: 124/86  Pulse: 74  Resp: 16  SpO2: 98%  Weight: 276 lb 6.4 oz (125.4 kg)    Exam General appearance : Awake, alert, not in any distress. Speech Clear. Not toxic looking HEENT decrease in bilateral peripheral vision loss Normocephalic, EOM Neck: Supple, no JVD. No cervical lymphadenopathy.  Chest: Good air entry bilaterally, no added sounds  CVS: S1 S2 regular, no murmurs.  Abdomen: Bowel sounds present, Non tender and not distended  with no gaurding, rigidity or rebound. Extremities: B/L Lower Ext shows no edema, both legs are warm to touch Neurology: Awake alert, and oriented X 3, CN II-XII intact, Non focal Skin: No Rash/ darken area on left knee  Data Review No results found for: "HGBA1C"  Assessment & Plan   1. Dyslipidemia Abnormally elevated cholesterol underlining cause pituitary gland. Patient will return for fasting labs tomorrow . Taking rosuvastatin and omega 3 fatty acids  Expecting improvement after pituitary tumor was removed.  2. Burn from the sun    3. Benign neoplasm of pituitary gland  Status post transsphenoidal pituitary resection  4. Reactive depression Patient at this time does not feel medication, counseling or psychiatrist is needed.  If she feels she is unable to handle her depression she will let PCP know.  She denies harm to self or others no visual or auditory hallucinations.  Will monitor closely       Patient have been counseled extensively about nutrition and exercise. Other issues discussed during this visit include: low cholesterol diet, weight control and daily exercise, foot care, annual eye examinations at Ophthalmology, importance of adherence with medications and regular follow-up. We also discussed long term complications of  uncontrolled diabetes and hypertension.   Return in about 3 months (around 07/05/2022) for hyperlipidemia.  The patient was given clear instructions to go to ER or return to medical center if symptoms don't improve, worsen or new problems develop. The patient verbalized understanding. The patient was told to call to get lab results if they haven't heard anything in the next week.   This note has been created with Surveyor, quantity. Any transcriptional errors are unintentional.   [name], NP 03/30/2022, 9:10 PM

## 2022-03-28 NOTE — Patient Instructions (Signed)

## 2022-03-29 ENCOUNTER — Other Ambulatory Visit (INDEPENDENT_AMBULATORY_CARE_PROVIDER_SITE_OTHER): Payer: BC Managed Care – PPO

## 2022-03-29 DIAGNOSIS — E785 Hyperlipidemia, unspecified: Secondary | ICD-10-CM | POA: Diagnosis not present

## 2022-03-30 ENCOUNTER — Other Ambulatory Visit (INDEPENDENT_AMBULATORY_CARE_PROVIDER_SITE_OTHER): Payer: Self-pay | Admitting: Primary Care

## 2022-03-30 LAB — LIPID PANEL
Chol/HDL Ratio: 3.9 ratio (ref 0.0–4.4)
Cholesterol, Total: 112 mg/dL (ref 100–199)
HDL: 29 mg/dL — ABNORMAL LOW (ref >39)
LDL Chol Calc (NIH): 55 mg/dL (ref 0–99)
Triglycerides: 167 mg/dL — ABNORMAL HIGH (ref 0–149)
VLDL Cholesterol Cal: 28 mg/dL (ref 5–40)

## 2022-04-01 DIAGNOSIS — G4733 Obstructive sleep apnea (adult) (pediatric): Secondary | ICD-10-CM | POA: Diagnosis not present

## 2022-04-05 LAB — SURGICAL PATHOLOGY

## 2022-04-13 DIAGNOSIS — D352 Benign neoplasm of pituitary gland: Secondary | ICD-10-CM | POA: Diagnosis not present

## 2022-04-13 DIAGNOSIS — Z6841 Body Mass Index (BMI) 40.0 and over, adult: Secondary | ICD-10-CM | POA: Diagnosis not present

## 2022-04-30 DIAGNOSIS — G4733 Obstructive sleep apnea (adult) (pediatric): Secondary | ICD-10-CM | POA: Diagnosis not present

## 2022-05-04 DIAGNOSIS — G4733 Obstructive sleep apnea (adult) (pediatric): Secondary | ICD-10-CM | POA: Diagnosis not present

## 2022-05-08 ENCOUNTER — Telehealth: Payer: Self-pay | Admitting: Emergency Medicine

## 2022-05-08 NOTE — Telephone Encounter (Signed)
Spoke with the pt and she states just called to let us know that she will begin RT in Dec 2023  She was unsure if this would affect her appt in Dec 2023  I advised I do not believe so, and am forwarding to [place] as FYI

## 2022-05-10 NOTE — Telephone Encounter (Signed)
Thank you. agree 

## 2022-05-22 ENCOUNTER — Ambulatory Visit
Admission: RE | Admit: 2022-05-22 | Discharge: 2022-05-22 | Disposition: A | Discharge status: Home / Self Care | Payer: BC Managed Care – PPO | Source: Ambulatory Visit | Attending: Internal Medicine | Admitting: Internal Medicine

## 2022-05-22 DIAGNOSIS — D352 Benign neoplasm of pituitary gland: Secondary | ICD-10-CM | POA: Diagnosis not present

## 2022-05-22 LAB — BUN & CREATININE (CHCC)
BUN: 9 mg/dL (ref 6–20)
Creatinine: 0.86 mg/dL (ref 0.44–1.00)
GFR, Estimated: >60 mL/min (ref >60)

## 2022-05-23 NOTE — Progress Notes (Signed)
Location/Histology of Brain Tumor:  Benign neoplasm of pituitary gland    Pathology:  FINAL MICROSCOPIC DIAGNOSIS:  A. PITUITARY TUMOR, RESECTION: Pituitary neuroendocrine tumor (adenoma of anterior pituitary gland), fragmented. There is no tumor necrosis. Adjacent strips of normal bone and nasal mucosa. There are no cytologic features diagnostic of malignancy.   GROSS DESCRIPTION:  Received fresh are portions of soft tan-pink to hemorrhagic tissue measuring 2.5 x 2.5 x 0.8 cm in aggregate.  There is a 1 cm portion of bone present.  The specimen is entirely submitted in 4 cassettes including decalcified bone.  [facility] 01/04/2022)   Final Diagnosis performed by Unknown Jim, MD.   Electronically signed 01/05/2022 Technical and / or Professional components performed at [facility]. [facility] [place] [address] [place], [place] [address].  Immunohistochemistry Technical component (if applicable) was performed at [facility]. [address], [place], [place], [place] [address].   IMMUNOHISTOCHEMISTRY DISCLAIMER (if applicable): Some of these immunohistochemical stains may have been developed and the performance characteristics determine by [facility]. Some may not have been cleared or approved by the U.S. Food and Drug Administration. The FDA has determined that such clearance or approval is not necessary. This test is used for clinical purposes. It should not be regarded as investigational or for research. This laboratory is certified under the [place] (CLIA-88) as qualified to perform high complexity clinical laboratory testing.  The controls stained appropriately.  ADDENDUM:  The case was sent to [facility] for performing additional immunostains. The immunostains show that the tumor cells are positive for Steroidogenic factor 1 (SF-1) and are negative for ACTH, prolactin, FSH, LH, GH and TSH.     Past or anticipated interventions, if any, per neurosurgery:  Dr. Isaias Cowman on 01-04-22 Procedure(s): Endonasal endoscopic resection of pituitary tumor ENDOSCOPIC SINUS SURGERY WITH TRANSPHENOIDAL PITUITARY REMOVAL WITH NASOSEPTAL FLAP   Dr. Zada Finders on 01-06-22 HISTORY: Beth [name] is a 46 y.o. female woman in whom I performed an endonasal endoscopic resection of a very large pituitary tumor. No intra-op evidence of CSF leakage but post-operatively, she developed clear rhinorrhea consistent with a CSF leak. I therefore recommended LD placement.     Past or anticipated interventions, if any, per medical oncology:  Assessment/Plan Pituitary adenoma [facility])   We appreciate the opportunity to participate in the care of Beth [name].  She presents today with clinical, histologic and radiographic syndrome consistent with macroadenoma.  This may have presented initially as secreting prolactinoma, but significant tumor growth despite dopaminergic therapy and current normal prolactin levels are less supportive.     She underwent surgery which was successful in debulking the sellar portion of the mass and improving visual function.     We discussed potential role for chemotherapy today.  Due to safety of fractionated SRS with current tumor burden, we will withhold systemic therapy.  This can be reserved for recurrent or refractory disease.   Plan will be to proceed with SRS 25/5 with Dr. Isidore Moos following RT treatment planning MRI in November.   She will continue to follow with Dr. Renne Crigler for endocrine labs and exogenous hormones as needed.   Screening for potential clinical trials was performed and discussed using eligibility criteria for active protocols at [facility], loco-regional tertiary centers, as well as national database available on [email or web address].     The patient is not a candidate for a research protocol at this time due to no suitable study identified.    We spent  twenty  additional minutes teaching regarding the natural history, biology, and historical experience in the treatment of brain tumors. We then discussed in detail the current recommendations for therapy focusing on the mode of administration, mechanism of action, anticipated toxicities, and quality of life issues associated with this plan. We also provided teaching sheets for the patient to take home as an additional resource.   We ask that Beth [name] return to clinic as needed following radiation therapy.   All questions were answered. The patient knows to call the clinic with any problems, questions or concerns. No barriers to learning were detected.   The total time spent in the encounter was 45 minutes and more than 50% was on counseling and review of test results     Ventura Sellers, MD Medical Director of Neuro-Oncology [place] [facility] [place] 03/21/22 9:30 AM      Dose of Decadron, if applicable: none  Recent neurologic symptoms, if any:  Seizures: none Headaches: none Nausea: none Dizziness/ataxia: none Difficulty with hand coordination: none Focal numbness/weakness: weakness in left side at baseline, weakness after covid Visual deficits/changes: yes, that is the main s/s that caused her to seek treatment Confusion/Memory deficits: some brain fog from covid  Painful bone metastases at present, if any: no  SAFETY ISSUES: Prior radiation? no Pacemaker/ICD? no Possible current pregnancy? No, iud Is the patient on methotrexate? no  Additional Complaints / other details: wants to to understand  mri results and what they mean  Vitals:   05/29/22 0812  BP: 131/62  Pulse: 79  Resp: 18  Temp: (!) 97.4 F (36.3 C)  SpO2: 98%

## 2022-05-25 ENCOUNTER — Ambulatory Visit
Admission: RE | Admit: 2022-05-25 | Discharge: 2022-05-25 | Disposition: A | Discharge status: Home / Self Care | Payer: BC Managed Care – PPO | Source: Ambulatory Visit | Attending: Radiation Oncology | Admitting: Radiation Oncology

## 2022-05-25 DIAGNOSIS — D352 Benign neoplasm of pituitary gland: Secondary | ICD-10-CM

## 2022-05-25 MED ORDER — GADOPICLENOL 0.5 MMOL/ML IV SOLN
10.0000 mL | Freq: Once | INTRAVENOUS | Status: AC | PRN
  Administered 2022-05-25: 10 mL via INTRAVENOUS

## 2022-05-28 NOTE — Progress Notes (Signed)
Radiation Oncology         [phone] ________________________________  Follow-up New Visit  Outpatient   Name: Beth [name] MRN: [id number]  Date: 05/29/2022  DOB: [birth year]  BE:[id number], [place] Cage, NP  [name], NP   REFERRING PHYSICIAN: [name], NP  DIAGNOSIS:  No diagnosis found.   Narrative / Interval History 05/29/22::Beth [name] is a 46 y.o. female who presents today for further discussion of radiation therapy in management of her pituitary adenoma. I last met with the patient for her initial consultation on 03/14/22. Since then, the patient followed up with Dr. Fredric Dine on 03/20/22. During which time, the patient reported gradual improvement in her nasal pressure with twice daily nasal saline rinses. Exam performed during this visit noted a large residual crust which was removed. Mucosal surfaces were noted to appear well-healed following removal of the crust. The posterior septectomy defect and sphenoidotomy defect also appeared normal.   The patient also followed up with Dr. Mickeal Skinner the following day, 03/21/22, to discuss the role of systemic treatment. Given her current tumor burden, Dr. Mickeal Skinner would like to avoid chemotherapy at this time. He may consider chemotherapy for her in the future if she has recurrent or refractory disease.   Her most recent MRI of the brain with and without contrast on 05/25/22 showed no significant interval change in size or appearance of the residual pituitary adenoma centered in the right cavernous sinus. MRI also showed a decrease in mucosal thickening and enhancement in the sphenoid sinuses, consistent with evolving postoperative changes.    HISTORY OF PRESENT ILLNESS (Initial Consultation 03/14/22)::Beth [name] is a 46 y.o. female who presents today for a opinion concerning radiation therapy in management of her diagnosed pituitary adenoma. About 20 years ago, the patient developed galactorrhea. Around this  time, she was noted to have elevated "sky high - in the thousands" Prolactin levels by her OB. Per the patient, she did not have any follow up imaging performed, but was managed with metformin which she had to stop due to pregnancy 1 month later. In 2013, she saw another OB/GYN for recurrent galactorrhea and high prolactin and she was started on Bromocriptine which she continued until her recent diagnosis.    In recent history, the patient developed blurry vision/floaters and was seen by her optometrist. She also followed up with [facility] endocrinology, Dr. Loanne Drilling, on 10/21/21 for her history of hyperprolactinemia (established care with Dr. Loanne Drilling in 2019). Given her vision changes, Dr. Hale Bogus ordered an MRI brain on 11/12/2021 which demonstrated a large 4.1 x 3.4 by 4.1 cm sellar/suprasellar mass with significant mass effect on the optic chiasm, cavernous sinus invasion, and extension into the sphenoid sinus.    For further evaluation and pre-op discussion, the patient was referred to Dr. Fredric Dine, [facility], on 12/12/21. Sinus CT performed for pre-operative planning during this visit redemonstrated the suprasellar/selar mass, measuring 4.1 cm. The mass was seen to remodel/erode portions of the sella turcica and extend into the bilateral sphenoid sinuses. CT also showed partial opacification of the bilateral frontal sinus drainage pathways, mild mucosal thickening versus small mucous retention cyst along the floor of the right maxillary sinus, and obstruction of the maxillary sinus ostium by mucosal thickening and/or secretions.   Accordingly, the patient was referred to neurosurgery and opted to proceed with resectioning of the pituitary tumor on 01/04/22 under the care of Dr. Zada Finders. Pathology from the procedure revealed pituitary neuroendocrine tumor (pituitary adenoma), without evidence of necrosis.  During a post-op follow-up visit with Dr. Fredric Dine on 01/19/22, the patient reported  some improvement in her vision since surgery, but endorsed persistent visual difficulties, especially in the right eye, and neck stiffness. She denied copious rhinorrhea. During a post-op endoscopy (and debridement) on 01/26/22, the patient was appreciated to have a thin amount of purulent drainage adjacent to the sphenoid defect. Accordingly, the patient was prescribed Augmentin. Per her most recent post-op visit with Dr. Fredric Dine on 02/13/22, the patient reported significant improvement after completing her oral antibiotics. Endoscopy also showed no evidence of persistent purulent drainage as previously noted. A small amount of scabbing along the nasal septal flap was debrided at this time. Sphenoid defect was also appreciated to be healing normally.   Follow-up MRI of the brain on 02/18/22 showed residual tumor with epicenter at the right cavernous sinus measuring approximately 2.6 x 3.0 x 1.7 cm, and resolution of suprasellar mass effect. MRI also showed abnormal mixed signal and enhancing material throughout both sphenoid sinuses; favored to be postoperative and/or sinus mucosal disease related in etiology.    The patient recently followed up with endocrinology on 03/06/22. Depending on the growth of the remnant tumor, the need to possible radiation therapy was discussed and the patient was promptly referred to me for further discussion.     Swallowing issues, if any: none   Weight Changes: none   Pain status: none   Other symptoms: vision changes, history of galactorrhea and elevated prolactin levels    Tobacco history, if any: none   ETOH abuse, if any: none   Prior cancers, if any: none   She reports history of long COVID which has caused some cognitive lingering side effects.  She reports cardiac disease. She still uses a cane after recovering from [place].  She works on a computer throughout the day and the work requires a lot of concentration and deep thinking.   PREVIOUS RADIATION  THERAPY: No  PAST MEDICAL HISTORY:  has a past medical history of Asthma, Chronic pancreatitis ([place]), DDD (degenerative disc disease), lumbosacral, DJD (degenerative joint disease), Headache, Hyperlipidemia, Hypertension, Hypoglycemia, Obesity, Osteoarthritis, Prolactinoma ([place]), Sciatica, and Sleep apnea.    PAST SURGICAL HISTORY: Past Surgical History:  Procedure Laterality Date   CHOLECYSTECTOMY     COLONOSCOPY     CRANIOTOMY N/A 01/04/2022   Procedure: Endonasal endoscopic resection of pituitary tumor;  Surgeon: Judith Part, MD;  Location: [place];  Service: Neurosurgery;  Laterality: N/A;  RM 20   NASAL SINUS SURGERY N/A 01/04/2022   Procedure: ENDOSCOPIC SINUS SURGERY TRANSNASAL PITUITARY REMOVAL WITH NASAL NASOSETTAL FLAP;  Surgeon: Jason Coop, DO;  Location: [facility];  Service: ENT;  Laterality: N/A;    FAMILY HISTORY: family history includes Arthritis in her mother; Asthma in her mother; Bell's palsy in her father; Colon cancer in her mother; Diabetes in her father and mother; Hypertension in her father and mother; Other in her paternal aunt; Thyroid disease in her mother.  SOCIAL HISTORY:  reports that she has never smoked. She has never been exposed to tobacco smoke. She has never used smokeless tobacco. She reports that she does not drink alcohol and does not use drugs.  ALLERGIES: [place], [place] grass, Cedar, Cladosporium cladosporioides, Dust mite extract, Elm bark [ulmus fulva], Molds & smuts, Mugwort, Nettle [nettle (urtica dioica)], Red mulberry allergy skin test, Sorrel-dock mix [sheep sorrel-yellow dock], Timothy grass pollen allergen, American cockroach, and Mixed ragweed  MEDICATIONS:  Current Outpatient Medications  Medication Sig Dispense Refill  Albuterol (PROVENTIL IN) Inhale into the lungs.     Ascorbic Acid (VITAMIN C) 1000 MG tablet Take 1,000 mg by mouth daily. (Patient not taking: Reported on 03/21/2022)     baclofen (LIORESAL) 10 MG tablet Take 0.5-1  tablets (5-10 mg total) by mouth 3 (three) times daily as needed for muscle spasms. (Patient not taking: Reported on 03/14/2022) 30 each 3   benzonatate (TESSALON) 200 MG capsule Take 1 capsule (200 mg total) by mouth 3 (three) times daily as needed for cough. (Patient not taking: Reported on 03/21/2022) 30 capsule 3   bromocriptine (PARLODEL) 5 MG capsule Take 1 capsule (5 mg total) by mouth daily. (Patient taking differently: Take 2.5 mg by mouth daily.) 90 capsule 3   Butenafine HCl (MENTAX) 1 % cream Apply 1 application topically 2 (two) times daily. 24 g 1   Cholecalciferol (VITAMIN D3 PO) Take 1 capsule by mouth daily.     desmopressin (DDAVP) 0.1 MG tablet Take 1 tablet by mouth 2 (two) times daily. (Patient not taking: Reported on 03/21/2022)     Docusate Sodium (COLACE PO) Take 1 capsule by mouth daily.     EPINEPHrine 0.3 mg/0.3 mL IJ SOAJ injection Inject 0.3 mg into the muscle as needed for anaphylaxis. (Patient not taking: Reported on 03/14/2022)     fluticasone (FLONASE) 50 MCG/ACT nasal spray Place 2 sprays into both nostrils daily. 16 g 3   HYDROcodone-acetaminophen (NORCO/VICODIN) 5-325 MG tablet Take 1 tablet by mouth every 4 (four) hours as needed (pain). 30 tablet 0   levonorgestrel (MIRENA) 20 MCG/24HR IUD 1 each by Intrauterine route once.      Menthol, Topical Analgesic, (BIOFREEZE) 10 % CREA Apply 1 Application topically daily as needed (pain). (Patient not taking: Reported on 03/21/2022)     NON FORMULARY Pt uses a cpap nightly     omega-3 fish oil (MAXEPA) 1000 MG CAPS capsule Take 1 capsule (1,000 mg total) by mouth 2 (two) times daily. 180 capsule 1   Pancrelipase, Lip-Prot-Amyl, (CREON) 24000-76000 units CPEP Take 2 capsules by mouth See admin instructions. Take 2 capsules three times daily with food and 1 capsule twice daily with snacks.     pantoprazole (PROTONIX) 20 MG tablet Take 20 mg by mouth daily.     psyllium (REGULOID) 0.52 g capsule Take 0.52 capsules by mouth  daily.     rosuvastatin (CRESTOR) 40 MG tablet Take 1 tablet (40 mg total) by mouth daily. 90 tablet 3   Trospium Chloride 60 MG CP24 Take 1 capsule by mouth daily as needed (overactive bladder). (Patient not taking: Reported on 03/14/2022)     Zinc Sulfate (ZINC 15 PO) Take 15 mg by mouth daily.     No current facility-administered medications for this encounter.    REVIEW OF SYSTEMS:  As above.   PHYSICAL EXAM:  vitals were not taken for this visit.   General: Alert and oriented, in no acute distress *** HEENT: Head is normocephalic. Extraocular movements are intact. Oropharynx is clear. Neck: Neck is supple, no palpable cervical or supraclavicular lymphadenopathy. Heart: Regular in rate and rhythm with no murmurs, rubs, or gallops. Chest: Clear to auscultation bilaterally, with no rhonchi, wheezes, or rales. Abdomen: Soft, nontender, nondistended, with no rigidity or guarding. Extremities: No cyanosis or edema. Lymphatics: see Neck Exam Skin: No concerning lesions. Musculoskeletal: symmetric strength and muscle tone throughout. Neurologic: Cranial nerves II through XII are grossly intact. No obvious focalities. Speech is fluent. Coordination is intact. Psychiatric: Judgment and insight  are intact. Affect is appropriate.   LABORATORY DATA:  Lab Results  Component Value Date   WBC 11.8 (H) 01/05/2022   HGB 11.4 (L) 01/05/2022   HCT 34.2 (L) 01/05/2022   MCV 82.8 01/05/2022   PLT 306 01/05/2022   CMP     Component Value Date/Time   NA 141 03/14/2022 0937   NA 141 12/16/2021 0946   K 3.8 01/13/2022 0500   CL 107 01/13/2022 0500   CO2 26 01/13/2022 0500   GLUCOSE 105 (H) 01/13/2022 0500   BUN 9 05/22/2022 1606   BUN 11 12/16/2021 0946   CREATININE 0.86 05/22/2022 1606   CREATININE 0.86 10/21/2021 1610   CALCIUM 8.8 (L) 01/13/2022 0500   PROT 6.8 12/16/2021 0946   ALBUMIN 3.0 (L) 01/13/2022 0500   ALBUMIN 4.1 12/16/2021 0946   AST 17 12/16/2021 0946   ALT 17 12/16/2021  0946   ALKPHOS 114 12/16/2021 0946   BILITOT <0.2 12/16/2021 0946   GFRNONAA >60 05/22/2022 1606   GFRAA 98 01/02/2020 0955         RADIOGRAPHY: MR Brain W Wo Contrast  Result Date: 05/26/2022 CLINICAL DATA:  Pituitary lesion, status post endoscopic resection. Radiation planning. EXAM: MRI HEAD WITHOUT AND WITH CONTRAST TECHNIQUE: Multiplanar, multiecho pulse sequences of the brain and surrounding structures were obtained without and with intravenous contrast. CONTRAST:  10 cc V[employer]way COMPARISON:  Brain MRI 02/18/2022 FINDINGS: Brain: Postsurgical changes reflecting prior trans-sphenoidal pituitary mass resection are again seen. Residual adenoma centered in the right cavernous sinus measures approximately 2.2 cm AP x 2.7 cm TV in the axial plane (19-52) and 2.9 cm TV x 1.4 cm cc in the coronal plane (18-9). The size of residual tumor is not significantly changed compared to the prior study allowing for slight differences in measurement technique and slice selection. There is unchanged cavernous sinus invasion and encasement of the cavernous ICA. There is no extent into the suprasellar cistern or mass effect on the chiasm. There is unchanged leftward deviation of the pituitary stalk. Previously seen enhancement in the adjacent sphenoid sinuses has decreased, favored to reflect evolving postoperative change as opposed to residual tumor. There is no acute intracranial hemorrhage, extra-axial fluid collection, or acute infarct Parenchymal volume is stable. The ventricles are stable in size. Parenchymal signal is normal. There is no other mass lesion or abnormal enhancement. There is no mass effect or midline shift. Vascular: Normal flow voids. Skull and upper cervical spine: Normal marrow signal. Sinuses/Orbits: As above, there is decreased mucosal thickening and enhancement of the sphenoid sinuses, likely evolving postoperative change. The globes and orbits are unremarkable. Other: None. IMPRESSION: 1. Since  02/18/2022, no significant interval change in size or appearance of the residual pituitary adenoma centered in the right cavernous sinus. 2. Decreased mucosal thickening and enhancement in the sphenoid sinuses, consistent with evolving postoperative change. Electronically Signed   By: Valetta Mole M.D.   On: 05/26/2022 11:01      IMPRESSION/PLAN: This is a very pleasant *** year old *** with a pituitary adenoma.   I had a lengthy discussion with the patient after reviewing their MRI results with them.  We spoke about whole brain radiotherapy versus stereotactic radiosurgery to the brain. We spoke about the differing risks benefits and side effects of both of these treatments. During part of our discussion, we spoke about the hair loss, fatigue and cognitive effects that can result from whole brain radiotherapy.  Additionally, we spoke about radionecrosis that can result from  stereotactic radiosurgery. I explained that whole brain radiotherapy is more comprehensive and therefore can decrease the chance of recurrences elsewhere in the brain, while stereotactic radiosurgery only treats the areas of gross disease while sparing the rest of the brain parenchyma.  After lengthy discussion, the patient would like to proceed with stereotactic brain radiosurgery to their metastatic disease. They will meet with neurosurgery in the near future to discuss this further; a neurosurgeon will participate in their case.  CT simulation will take place on *** and treatment on ***.  I plan to deliver *** Gy in 1 fraction to ***.  On date of service, in total, I spent *** minutes on this encounter. Patient was seen in person.   __________________________________________   Eppie Gibson, MD  This document serves as a record of services personally performed by Eppie Gibson, MD. It was created on her behalf by Roney Mans, a trained medical scribe. The creation of this record is based on the scribe's personal observations  and the provider's statements to them. This document has been checked and approved by the attending provider.

## 2022-05-29 ENCOUNTER — Inpatient Hospital Stay: Payer: BC Managed Care – PPO | Attending: Neurological Surgery

## 2022-05-29 ENCOUNTER — Other Ambulatory Visit: Payer: Self-pay

## 2022-05-29 ENCOUNTER — Ambulatory Visit: Payer: BC Managed Care – PPO

## 2022-05-29 ENCOUNTER — Ambulatory Visit
Admission: RE | Admit: 2022-05-29 | Discharge: 2022-05-29 | Disposition: A | Discharge status: Home / Self Care | Payer: BC Managed Care – PPO | Source: Ambulatory Visit | Attending: Radiation Oncology | Admitting: Radiation Oncology

## 2022-05-29 ENCOUNTER — Encounter: Payer: Self-pay | Admitting: Radiation Oncology

## 2022-05-29 VITALS — BP 131/62 | HR 79 | Temp 97.4°F | Resp 18 | Ht 69.0 in | Wt 280.8 lb

## 2022-05-29 DIAGNOSIS — R410 Disorientation, unspecified: Secondary | ICD-10-CM | POA: Insufficient documentation

## 2022-05-29 DIAGNOSIS — Z800 Family history of malignant neoplasm of digestive organs: Secondary | ICD-10-CM | POA: Insufficient documentation

## 2022-05-29 DIAGNOSIS — K861 Other chronic pancreatitis: Secondary | ICD-10-CM | POA: Insufficient documentation

## 2022-05-29 DIAGNOSIS — D352 Benign neoplasm of pituitary gland: Secondary | ICD-10-CM | POA: Diagnosis not present

## 2022-05-29 DIAGNOSIS — M1990 Unspecified osteoarthritis, unspecified site: Secondary | ICD-10-CM | POA: Diagnosis not present

## 2022-05-29 DIAGNOSIS — G4730 Sleep apnea, unspecified: Secondary | ICD-10-CM | POA: Diagnosis not present

## 2022-05-29 DIAGNOSIS — R200 Anesthesia of skin: Secondary | ICD-10-CM | POA: Insufficient documentation

## 2022-05-29 DIAGNOSIS — Z510 Encounter for antineoplastic radiation therapy: Secondary | ICD-10-CM | POA: Diagnosis not present

## 2022-05-29 DIAGNOSIS — E785 Hyperlipidemia, unspecified: Secondary | ICD-10-CM | POA: Diagnosis not present

## 2022-05-29 DIAGNOSIS — R531 Weakness: Secondary | ICD-10-CM | POA: Insufficient documentation

## 2022-05-29 DIAGNOSIS — E669 Obesity, unspecified: Secondary | ICD-10-CM | POA: Diagnosis not present

## 2022-05-29 DIAGNOSIS — I10 Essential (primary) hypertension: Secondary | ICD-10-CM | POA: Diagnosis not present

## 2022-05-29 DIAGNOSIS — J45909 Unspecified asthma, uncomplicated: Secondary | ICD-10-CM | POA: Insufficient documentation

## 2022-05-29 DIAGNOSIS — Z79899 Other long term (current) drug therapy: Secondary | ICD-10-CM | POA: Diagnosis not present

## 2022-05-29 LAB — PREGNANCY, URINE: Preg Test, Ur: NEGATIVE

## 2022-05-29 MED ORDER — SODIUM CHLORIDE FLUSH 0.9 % IV SOLN
3.0000 mL | Freq: Once | INTRAVENOUS | Status: AC
  Administered 2022-05-29: 3 mL via INTRAVENOUS
  Filled 2022-05-29: qty 3

## 2022-05-29 NOTE — Progress Notes (Signed)
IV removed without complication after successful injection of contrast media during simulation procedure.   Mont Dutton R.T.(R)(T) Radiation Special Procedures Navigator

## 2022-05-29 NOTE — Progress Notes (Signed)
Has armband been applied?  Yes.    Does patient have an allergy to IV contrast dye?: No.   Has patient ever received premedication for IV contrast dye?: No.   Does patient take metformin?: No.  If patient does take metformin when was the last dose: None  Date of lab work: May 22, 2022 BUN: 9 CR: 0.86 eGfr: >60  IV site: antecubital right, condition patent and no redness  Has IV site been added to flowsheet?  Yes.    There were no vitals taken for this visit.    Leandra Kern, LPN

## 2022-05-30 DIAGNOSIS — G4733 Obstructive sleep apnea (adult) (pediatric): Secondary | ICD-10-CM | POA: Diagnosis not present

## 2022-05-31 ENCOUNTER — Encounter (INDEPENDENT_AMBULATORY_CARE_PROVIDER_SITE_OTHER): Payer: Self-pay | Admitting: Primary Care

## 2022-05-31 ENCOUNTER — Ambulatory Visit (INDEPENDENT_AMBULATORY_CARE_PROVIDER_SITE_OTHER): Payer: BC Managed Care – PPO | Admitting: Primary Care

## 2022-05-31 VITALS — BP 124/87 | HR 65 | Resp 16 | Ht 69.0 in | Wt 277.0 lb

## 2022-05-31 DIAGNOSIS — Z1159 Encounter for screening for other viral diseases: Secondary | ICD-10-CM

## 2022-05-31 DIAGNOSIS — E785 Hyperlipidemia, unspecified: Secondary | ICD-10-CM

## 2022-05-31 DIAGNOSIS — D352 Benign neoplasm of pituitary gland: Secondary | ICD-10-CM | POA: Diagnosis not present

## 2022-05-31 NOTE — Progress Notes (Signed)
Beth  Haevyn [name], is a 46 y.o. female  RAQ:[id number]  KTG:[id number]  DOB - 09-01-1975  Chief Complaint  Patient presents with   dyslipidemia       Subjective:   Beth [name] is a 46 y.o. morbid obese female here today for a follow up visit fasting labs for hyperlipidemia. Dx with Pituitary macroadenoma now s/p  transsphenoidal surgery.  Cholesterol panel was elevated and consulted with Lipidologist/cardiologist recommended s/p continue on current lipid lowering medication and repeat labs. He expected normalizing cholesterol levels. Patient has No headache, No chest pain, No abdominal pain - No Nausea, No new weakness tingling or numbness, No Cough - shortness of breath. Improving slowly.  No problems updated.  Allergies  Allergen Reactions   Bahia Hives, Shortness Of Breath and Swelling    Found through allergy testing, not sure which item caused the shortness of breath   [place] Grass Hives, Shortness Of Breath and Swelling    Found through allergy testing, not sure which item caused the shortness of breath   SYSCO, Shortness Of Breath and Swelling    Found through allergy testing, not sure which item caused the shortness of breath   Cladosporium Cladosporioides Hives, Shortness Of Breath and Swelling    Found through allergy testing, not sure which item caused the shortness of breath   Dust Mite Extract Hives, Shortness Of Breath and Swelling    Found through allergy testing, not sure which item caused the shortness of breath   Elm Bark [Ulmus Fulva] Hives, Shortness Of Breath and Swelling    Found through allergy testing, not sure which item caused the shortness of breath   Molds & Smuts Hives, Shortness Of Breath and Swelling    Found through allergy testing, not sure which item caused the shortness of breath   Mugwort Hives, Shortness Of Breath and Swelling    Found through allergy testing, not sure which item caused the shortness  of breath   Nettle [Nettle (Urtica Dioica)] Hives, Shortness Of Breath and Swelling   Red Mulberry Allergy Skin Test Hives, Shortness Of Breath and Swelling    Found through allergy testing, not sure which item caused the shortness of breath   Sorrel-Dock Mix [Sheep Sorrel-Yellow Dock] Hives, Shortness Of Breath and Swelling    Found through allergy testing, not sure which item caused the shortness of breath   Timothy Grass Pollen Allergen Hives, Shortness Of Breath and Swelling    Found through allergy testing, not sure which item caused the shortness of breath   American Cockroach    Mixed Ragweed     Past Medical History:  Diagnosis Date   Asthma    Chronic pancreatitis (HCC)    DDD (degenerative disc disease), lumbosacral    DJD (degenerative joint disease)    Headache    Hyperlipidemia    Hypertension    Hypoglycemia    Obesity    Osteoarthritis    Prolactinoma ([place])    Sciatica    Sleep apnea     Current Outpatient Medications on File Prior to Visit  Medication Sig Dispense Refill   Albuterol (PROVENTIL IN) Inhale into the lungs.     Ascorbic Acid (VITAMIN C) 1000 MG tablet Take 1,000 mg by mouth daily.     baclofen (LIORESAL) 10 MG tablet Take 0.5-1 tablets (5-10 mg total) by mouth 3 (three) times daily as needed for muscle spasms. 30 each 3   benzonatate (TESSALON) 200 MG capsule Take 1 capsule (200  mg total) by mouth 3 (three) times daily as needed for cough. 30 capsule 3   bromocriptine (PARLODEL) 5 MG capsule Take 1 capsule (5 mg total) by mouth daily. (Patient taking differently: Take 2.5 mg by mouth daily.) 90 capsule 3   Butenafine HCl (MENTAX) 1 % cream Apply 1 application topically 2 (two) times daily. 24 g 1   Cholecalciferol (VITAMIN D3 PO) Take 1 capsule by mouth daily.     desmopressin (DDAVP) 0.1 MG tablet Take 1 tablet by mouth 2 (two) times daily. (Patient not taking: Reported on 03/21/2022)     Docusate Sodium (COLACE PO) Take 1 capsule by mouth daily.      EPINEPHrine 0.3 mg/0.3 mL IJ SOAJ injection Inject 0.3 mg into the muscle as needed for anaphylaxis. (Patient not taking: Reported on 03/14/2022)     fluticasone (FLONASE) 50 MCG/ACT nasal spray Place 2 sprays into both nostrils daily. 16 g 3   HYDROcodone-acetaminophen (NORCO/VICODIN) 5-325 MG tablet Take 1 tablet by mouth every 4 (four) hours as needed (pain). 30 tablet 0   levonorgestrel (MIRENA) 20 MCG/24HR IUD 1 each by Intrauterine route once.      Menthol, Topical Analgesic, (BIOFREEZE) 10 % CREA Apply 1 Application topically daily as needed (pain).     NON FORMULARY Pt uses a cpap nightly (Patient not taking: Reported on 05/29/2022)     omega-3 fish oil (MAXEPA) 1000 MG CAPS capsule Take 1 capsule (1,000 mg total) by mouth 2 (two) times daily. 180 capsule 1   Pancrelipase, Lip-Prot-Amyl, (CREON) 24000-76000 units CPEP Take 2 capsules by mouth See admin instructions. Take 2 capsules three times daily with food and 1 capsule twice daily with snacks.     pantoprazole (PROTONIX) 20 MG tablet Take 20 mg by mouth daily.     psyllium (REGULOID) 0.52 g capsule Take 0.52 capsules by mouth daily.     rosuvastatin (CRESTOR) 40 MG tablet Take 1 tablet (40 mg total) by mouth daily. 90 tablet 3   Trospium Chloride 60 MG CP24 Take 1 capsule by mouth daily as needed (overactive bladder). (Patient not taking: Reported on 03/14/2022)     Zinc Sulfate (ZINC 15 PO) Take 15 mg by mouth daily.     No current facility-administered medications on file prior to visit.    Objective:   Vitals:   05/31/22 0847  BP: 124/87  Pulse: 65  Resp: 16  SpO2: 96%  Weight: 277 lb (125.6 kg)  Height: '5\' 9"'$  (1.753 m)    Exam General appearance : Awake, alert, not in any distress. Speech Clear. Not toxic looking HEENT: Atraumatic and Normocephalic, pupils equally reactive to light and accomodation Neck: Supple, no JVD. No cervical lymphadenopathy.  Chest: Good air entry bilaterally, no added sounds  CVS: S1 S2  regular, no murmurs.  Abdomen: Bowel sounds present, Non tender and not distended with no gaurding, rigidity or rebound. Extremities: B/L Lower Ext shows no edema, both legs are warm to touch Neurology: Awake alert, and oriented X 3, CN II-XII intact, Non focal Skin: No Rash  Data Review No results found for: "HGBA1C"  Assessment & Plan   1. Dyslipidemia  Healthy lifestyle diet of fruits vegetables fish nuts whole grains and low saturated fat . Foods high in cholesterol or liver, fatty meats,cheese, butter avocados, nuts and seeds, chocolate and fried foods. - Lipid panel  2. Encounter for HCV screening test for low risk patient Health maintenance   - HCV Ab w Reflex to Quant PCR  3. Morbid obesity Morbid Obesity is BMI > 40 indicating an excess in caloric intake or underlining conditions. This may lead to other co-morbidities. Educated on lifestyle modifications of diet and exercise which may reduce obesity.  Not medically stable.   Patient have been counseled extensively about nutrition and exercise. Other issues discussed during this visit include: low cholesterol diet, weight control and daily exercise, foot care, annual eye examinations at Ophthalmology, importance of adherence with medications and regular follow-up. We also discussed long term complications of uncontrolled diabetes and hypertension.   Return in about 3 months (around 08/30/2022).  The patient was given clear instructions to go to ER or return to medical center if symptoms don't improve, worsen or new problems develop. The patient verbalized understanding. The patient was told to call to get lab results if they haven't heard anything in the next week.   This note has been created with Surveyor, quantity. Any transcriptional errors are unintentional.   [name], NP 06/04/2022, 8:47 PM

## 2022-06-02 LAB — LIPID PANEL
Chol/HDL Ratio: 3.1 ratio (ref 0.0–4.4)
Cholesterol, Total: 130 mg/dL (ref 100–199)
HDL: 42 mg/dL (ref >39)
LDL Chol Calc (NIH): 72 mg/dL (ref 0–99)
Triglycerides: 81 mg/dL (ref 0–149)
VLDL Cholesterol Cal: 16 mg/dL (ref 5–40)

## 2022-06-02 LAB — HCV AB W REFLEX TO QUANT PCR: HCV Ab: NONREACTIVE

## 2022-06-02 LAB — HCV INTERPRETATION

## 2022-06-04 ENCOUNTER — Encounter (INDEPENDENT_AMBULATORY_CARE_PROVIDER_SITE_OTHER): Payer: Self-pay | Admitting: Primary Care

## 2022-06-05 ENCOUNTER — Other Ambulatory Visit: Payer: Self-pay

## 2022-06-05 ENCOUNTER — Ambulatory Visit
Admission: RE | Admit: 2022-06-05 | Discharge: 2022-06-05 | Disposition: A | Discharge status: Home / Self Care | Payer: BC Managed Care – PPO | Source: Ambulatory Visit | Attending: Radiation Oncology | Admitting: Radiation Oncology

## 2022-06-05 ENCOUNTER — Ambulatory Visit: Payer: BC Managed Care – PPO

## 2022-06-05 DIAGNOSIS — Z510 Encounter for antineoplastic radiation therapy: Secondary | ICD-10-CM | POA: Diagnosis not present

## 2022-06-05 DIAGNOSIS — D352 Benign neoplasm of pituitary gland: Secondary | ICD-10-CM | POA: Diagnosis not present

## 2022-06-05 LAB — RAD ONC ARIA SESSION SUMMARY
Course Elapsed Days: 0
Plan Fractions Treated to Date: 1
Plan Prescribed Dose Per Fraction: 5 Gy
Plan Total Fractions Prescribed: 5
Plan Total Prescribed Dose: 25 Gy
Reference Point Dosage Given to Date: 5 Gy
Reference Point Session Dosage Given: 5 Gy
Session Number: 1

## 2022-06-05 NOTE — Op Note (Signed)
  Name: Beth [name]  MRN: [id number]  Date: 06/05/2022   DOB: 12/17/1975  Stereotactic Radiosurgery Operative Note  PRE-OPERATIVE DIAGNOSIS:  Pituitary adenoma  POST-OPERATIVE DIAGNOSIS:  Pituitary adenoma  PROCEDURE:  Stereotactic Radiosurgery  SURGEON:  Judith Part, MD  NARRATIVE: The patient underwent a radiation treatment planning session in the radiation oncology simulation suite under the care of the radiation oncology physician and physicist.  I participated closely in the radiation treatment planning afterwards. The patient underwent planning CT which was fused to 3T high resolution MRI with 1 mm axial slices.  These images were fused on the planning system.  We contoured the gross target volumes and subsequently expanded this to yield the Planning Target Volume. I actively participated in the planning process.  I helped to define and review the target contours and also the contours of the optic pathway, eyes, brainstem and selected nearby organs at risk.  All the dose constraints for critical structures were reviewed and compared to AAPM Task Group 101.  The prescription dose conformity was reviewed.  I approved the plan electronically.    Accordingly, [name] was brought to the TrueBeam stereotactic radiation treatment linac and placed in the custom immobilization mask.  The patient was aligned according to the IR fiducial markers with BrainLab Exactrac, then orthogonal x-rays were used in ExacTrac with the 6DOF robotic table and the shifts were made to align the patient  [name] received stereotactic radiosurgery uneventfully.    Lesions treated:  1   Complex lesions treated:  1 (>3.5 cm, <27m of optic path, or within the brainstem)   The detailed description of the procedure is recorded in the radiation oncology procedure note.  I was present for the duration of the procedure.  DISPOSITION:  Following delivery, the patient was transported to nursing in  stable condition and monitored for possible acute effects to be discharged to home in stable condition with follow-up in one month.  TJudith Part MD 06/05/2022 1:10 PM

## 2022-06-05 NOTE — Progress Notes (Signed)
Nurse monitoring complete status post SRS treatment. Patient without complaints. Pt was monitored for 15 minutes. Patient denies new or worsening neurologic symptoms. Vitals stable. Instructed patient to avoid strenuous activity for the next 24. Instructed patient to call [phone] with needs related to treatment after hours or over the weekend. Patient verbalized understanding.  Vitals:   06/05/22 1350  BP: (!) 144/94  Pulse: 70  Resp: 20  Temp: (!) 97.5 F (36.4 C)  SpO2: 100%     Pt was able to ambulate out of clinic in stable condition.

## 2022-06-07 ENCOUNTER — Other Ambulatory Visit: Payer: Self-pay

## 2022-06-07 ENCOUNTER — Encounter: Payer: Self-pay | Admitting: Radiation Oncology

## 2022-06-07 ENCOUNTER — Ambulatory Visit
Admission: RE | Admit: 2022-06-07 | Discharge: 2022-06-07 | Disposition: A | Discharge status: Home / Self Care | Payer: BC Managed Care – PPO | Source: Ambulatory Visit | Attending: Radiation Oncology | Admitting: Radiation Oncology

## 2022-06-07 DIAGNOSIS — D352 Benign neoplasm of pituitary gland: Secondary | ICD-10-CM | POA: Diagnosis not present

## 2022-06-07 DIAGNOSIS — Z510 Encounter for antineoplastic radiation therapy: Secondary | ICD-10-CM | POA: Diagnosis not present

## 2022-06-07 LAB — RAD ONC ARIA SESSION SUMMARY
Course Elapsed Days: 2
Plan Fractions Treated to Date: 2
Plan Prescribed Dose Per Fraction: 5 Gy
Plan Total Fractions Prescribed: 5
Plan Total Prescribed Dose: 25 Gy
Reference Point Dosage Given to Date: 10 Gy
Reference Point Session Dosage Given: 5 Gy
Session Number: 2

## 2022-06-07 NOTE — Progress Notes (Signed)
Nurse monitoring complete status post SRS treatments. Patient without complaints. Patient denies new or worsening neurologic symptoms. Pt monitored for 15 minutes. Vitals stable. Instructed patient to avoid strenuous activity for the next 24 hours. Instructed patient to call [phone] with needs related to treatment after hours or over the weekend. Patient verbalized understanding.  Pt tired but stable after treatment. Pt feels like she is safe to drive home. Pt able to ambulate out of clinic.   Vitals:   06/07/22 1223  BP: (!) 132/91  Pulse: 85  Resp: 18  Temp: (!) 97.3 F (36.3 C)  SpO2: 100%

## 2022-06-08 ENCOUNTER — Telehealth: Payer: Self-pay

## 2022-06-09 ENCOUNTER — Ambulatory Visit
Admission: RE | Admit: 2022-06-09 | Discharge: 2022-06-09 | Disposition: A | Discharge status: Home / Self Care | Payer: BC Managed Care – PPO | Source: Ambulatory Visit | Attending: Radiation Oncology | Admitting: Radiation Oncology

## 2022-06-09 ENCOUNTER — Other Ambulatory Visit: Payer: Self-pay

## 2022-06-09 VITALS — BP 127/86 | HR 73 | Temp 97.7°F | Resp 20 | Ht 69.0 in

## 2022-06-09 DIAGNOSIS — D352 Benign neoplasm of pituitary gland: Secondary | ICD-10-CM | POA: Diagnosis not present

## 2022-06-09 DIAGNOSIS — Z510 Encounter for antineoplastic radiation therapy: Secondary | ICD-10-CM | POA: Diagnosis not present

## 2022-06-09 LAB — RAD ONC ARIA SESSION SUMMARY
Course Elapsed Days: 4
Plan Fractions Treated to Date: 3
Plan Prescribed Dose Per Fraction: 5 Gy
Plan Total Fractions Prescribed: 5
Plan Total Prescribed Dose: 25 Gy
Reference Point Dosage Given to Date: 15 Gy
Reference Point Session Dosage Given: 5 Gy
Session Number: 3

## 2022-06-09 MED ORDER — PROMETHAZINE HCL 25 MG PO TABS: 25.0000 mg | ORAL_TABLET | Freq: Four times a day (QID) | ORAL | 1 refills | Status: DC | PRN

## 2022-06-09 MED ORDER — ONDANSETRON 8 MG PO TBDP: 8.0000 mg | ORAL_TABLET | Freq: Three times a day (TID) | ORAL | 1 refills | Status: AC | PRN

## 2022-06-09 NOTE — Progress Notes (Signed)
Nurse monitoring complete status post SRS treatment. Patient did report nausea and mild headache, Md aware of this and did see pt for these needs. Pt was monitored for 15 minutes.  Pt given prescription for medication for nausea by Dr. Isidore Moos. Patient denies new or worsening neurologic symptoms, though she is very tired after treatment. Vitals stable. Instructed patient to avoid strenuous activity for the next 24 hours.  Instructed patient to call [phone] with needs related to treatment after hours or over the weekend. Patient verbalized understanding.  Vitals:   06/09/22 1315  BP: 127/86  Pulse: 73  Resp: 20  Temp: 97.7 F (36.5 C)  SpO2: 98%    Pt able to walk out of clinic with her Cane. Md went over medication instructions with pt prior to her being discharged from the clinic.

## 2022-06-09 NOTE — Progress Notes (Signed)
Ms. Shores was seen today after radiosurgery due to new onset nausea since yesterday evening as well as concerns about some nasal discharge.  The patient describes pea-sized discharge from her nose which are coated with blood and mucus but have a fleshlike consistency.  She has experienced similar discharge since pituitary surgery however the texture of the discharge is different and more erythematous than usual.  She also reports nausea since last night and has vomited once.  Headaches are not more severe than usual.  No sign of cerebrospinal fluid leak upon further questioning.  I spoke with Dr. Fredric Dine who states that the symptoms are consistent with continued healing from surgery (the consistency of the discharge is consistent with surgical packing material) and that symptoms like this can get worse with the cold weather.  She recommended that she continue saline treatments, humidifier, Vaseline application to nostrils (all of which I reiterated to patient.).  I recommended that the patient call her neurosurgeon if she develops symptoms of clear fluid leakage which could be a sign of a CSF leak..  Also gave the patient a prescription for 2 different antinausea medications to take as needed.  -----------------------------------  Eppie Gibson, MD

## 2022-06-12 ENCOUNTER — Other Ambulatory Visit: Payer: Self-pay

## 2022-06-12 ENCOUNTER — Encounter: Payer: Self-pay | Admitting: Radiation Oncology

## 2022-06-12 ENCOUNTER — Ambulatory Visit
Admission: RE | Admit: 2022-06-12 | Discharge: 2022-06-12 | Disposition: A | Discharge status: Home / Self Care | Payer: BC Managed Care – PPO | Source: Ambulatory Visit | Attending: Radiation Oncology | Admitting: Radiation Oncology

## 2022-06-12 DIAGNOSIS — D352 Benign neoplasm of pituitary gland: Secondary | ICD-10-CM | POA: Diagnosis not present

## 2022-06-12 DIAGNOSIS — Z510 Encounter for antineoplastic radiation therapy: Secondary | ICD-10-CM | POA: Diagnosis not present

## 2022-06-12 LAB — RAD ONC ARIA SESSION SUMMARY
Course Elapsed Days: 7
Plan Fractions Treated to Date: 4
Plan Prescribed Dose Per Fraction: 5 Gy
Plan Total Fractions Prescribed: 5
Plan Total Prescribed Dose: 25 Gy
Reference Point Dosage Given to Date: 20 Gy
Reference Point Session Dosage Given: 5 Gy
Session Number: 4

## 2022-06-12 NOTE — Progress Notes (Addendum)
Nurse monitoring complete status post SRS treatment. Patient without complaints. Patient denies new or worsening neurologic symptoms. Vitals stable. Instructed patient to avoid strenuous activity for the next 24 hours. Instructed patient to call [phone] with needs related to treatment after hours or over the weekend. Patient verbalized understanding.   Vitals:   06/12/22 1337  BP: 122/67  Pulse: 73  Resp: 20  Temp: 97.7 F (36.5 C)  SpO2: 99%   Pt was able to ambulate of clinic with her cane (which is baseline). She only complained of mild head pressure but stated she would take tylenol when she got home. Pt understood to go home and rest. No needs at time of discharge from clinic.

## 2022-06-13 ENCOUNTER — Ambulatory Visit (INDEPENDENT_AMBULATORY_CARE_PROVIDER_SITE_OTHER): Payer: BC Managed Care – PPO | Admitting: Emergency Medicine

## 2022-06-13 ENCOUNTER — Ambulatory Visit (INDEPENDENT_AMBULATORY_CARE_PROVIDER_SITE_OTHER): Payer: BC Managed Care – PPO

## 2022-06-13 ENCOUNTER — Encounter: Payer: Self-pay | Admitting: Emergency Medicine

## 2022-06-13 VITALS — BP 116/70 | HR 73 | Temp 98.6°F | Ht 69.0 in | Wt 277.2 lb

## 2022-06-13 DIAGNOSIS — J1282 Pneumonia due to coronavirus disease 2019: Secondary | ICD-10-CM | POA: Diagnosis not present

## 2022-06-13 DIAGNOSIS — J45909 Unspecified asthma, uncomplicated: Secondary | ICD-10-CM | POA: Diagnosis not present

## 2022-06-13 DIAGNOSIS — U071 COVID-19: Secondary | ICD-10-CM

## 2022-06-13 DIAGNOSIS — G4733 Obstructive sleep apnea (adult) (pediatric): Secondary | ICD-10-CM

## 2022-06-13 MED ORDER — BENZONATATE 200 MG PO CAPS: 200.0000 mg | ORAL_CAPSULE | Freq: Three times a day (TID) | ORAL | 3 refills | Status: AC | PRN

## 2022-06-13 MED ORDER — ALBUTEROL SULFATE HFA 108 (90 BASE) MCG/ACT IN AERS: 2.0000 | INHALATION_SPRAY | Freq: Four times a day (QID) | RESPIRATORY_TRACT | 6 refills | Status: DC | PRN

## 2022-06-13 NOTE — Assessment & Plan Note (Signed)
She has had to be off of her CPAP since her transsphenoidal surgery.  She still has some drainage consistent with CSF leakage.  She is following with ENT.  When they clear her to do so we will try to get back on the CPAP.  I will follow-up with her in 3 months assuming that she is cleared at her next visit.  If unable to get back on CPAP we can push this visit off for a few more months.

## 2022-06-13 NOTE — Patient Instructions (Signed)
Follow-up with ENT as planned.  When you are cleared to do so we need to get back on your CPAP every night. Keep albuterol available use 2 puffs when needed for shortness of breath, chest tightness, wheezing.  We will refill this for you today. Chest x-ray today to follow chronic changes from your prior COVID-19 infection. Follow with Dr Lamonte Sakai in 3 months or sooner if you have any problems.

## 2022-06-13 NOTE — Assessment & Plan Note (Signed)
Basilar residual changes on her most recent chest x-ray after COVID-19 pneumonia.  I will check a final chest x-ray today to ensure interval stability.

## 2022-06-13 NOTE — Addendum Note (Signed)
Addended by: Collene Gobble on: 06/13/2022 09:55 AM   Modules accepted: Orders

## 2022-06-13 NOTE — Assessment & Plan Note (Signed)
Overall doing well.  She does use her albuterol about once daily.  Usually at night.  No exacerbations, no prednisone.  No clear indication to start scheduled BD or ICS therapy at this time.  We will refill her albuterol today.

## 2022-06-13 NOTE — Progress Notes (Signed)
Subjective:    Patient ID: Beth [name], female    DOB: 1975-07-10, 46 y.o.   MRN: [id number]  HPI   ROV 06/22/21 --46 year old woman who follows up today for asthma and OSA on CPAP.  Spirometry 03/22/2020 mixed mild obstruction and mild restriction. Has remained limited since she had COVID, believes that her SOB is better.  Not on scheduled inhaled medication, has albuterol and uses for congestion, certain exposures with wheeze, some exertion. Uses a few times a week.  Protonix 20 mg daily. CPAP compliance report available from 11/28 through 06/21/2021, shows under percent compliance with 97% of those nights greater than 4 hours.  AutoSet mode 5-20 cm water.  Median pressure 8.5, 95 percentile 11.5 with minimal leak and good control of events.  She reports that she is having some increased nasal congestion, seasonal. More noticeable on the CPAP. Confirms that she is benefiting from CPAP, more rested in the am, better energy. Less napping  ROV 06/13/22 --follow-up visit today for 46 year old woman with history of asthma and OSA on CPAP.  Underwent partial resection of a large pituitary tumor in July 2023 with rhinorrhea associated with CSF leak.  She has had radiation therapy as well to treat remaining tumor.  She is still unable to wear her CPAP machine due to the procedure - hopefully she will be able to restart soon.   She has albuterol available to use if needed, is not on any scheduled inhaler medication.  She reports today that she uses about one a day, usually at night.  She has GERD and is on Protonix.    Review of Systems As per HPI     Objective:   Physical Exam Vitals:   06/13/22 0928  BP: 116/70  Pulse: 73  Temp: 98.6 F (37 C)  TempSrc: Oral  SpO2: 99%  Weight: 277 lb 3.2 oz (125.7 kg)  Height: '5\' 9"'$  (1.753 m)    Gen: Pleasant, obese woman, in no distress,  normal affect  ENT: No lesions,  mouth clear,  oropharynx clear, no postnasal drip  Neck: No JVD, no  stridor  Lungs: No use of accessory muscles, no wheeze or crackles.   Cardiovascular: RRR, heart sounds normal, no murmur or gallops, no peripheral edema  Musculoskeletal: No deformities, no cyanosis or clubbing  Neuro: alert, awake, non focal  Skin: Warm, no lesions or rash      Assessment & Plan:  Asthma without acute exacerbation Overall doing well.  She does use her albuterol about once daily.  Usually at night.  No exacerbations, no prednisone.  No clear indication to start scheduled BD or ICS therapy at this time.  We will refill her albuterol today.  Pneumonia due to COVID-19 virus Basilar residual changes on her most recent chest x-ray after COVID-19 pneumonia.  I will check a final chest x-ray today to ensure interval stability.  Obstructive sleep apnea She has had to be off of her CPAP since her transsphenoidal surgery.  She still has some drainage consistent with CSF leakage.  She is following with ENT.  When they clear her to do so we will try to get back on the CPAP.  I will follow-up with her in 3 months assuming that she is cleared at her next visit.  If unable to get back on CPAP we can push this visit off for a few more months.   Baltazar Apo, MD, PhD 06/13/2022, 9:49 AM [facility] [phone] or if no answer  [phone]   

## 2022-06-14 ENCOUNTER — Other Ambulatory Visit: Payer: Self-pay

## 2022-06-14 ENCOUNTER — Telehealth: Payer: Self-pay

## 2022-06-14 ENCOUNTER — Ambulatory Visit
Admission: RE | Admit: 2022-06-14 | Discharge: 2022-06-14 | Disposition: A | Discharge status: Home / Self Care | Payer: BC Managed Care – PPO | Source: Ambulatory Visit | Attending: Radiation Oncology | Admitting: Radiation Oncology

## 2022-06-14 DIAGNOSIS — Z510 Encounter for antineoplastic radiation therapy: Secondary | ICD-10-CM | POA: Diagnosis not present

## 2022-06-14 DIAGNOSIS — D352 Benign neoplasm of pituitary gland: Secondary | ICD-10-CM | POA: Diagnosis not present

## 2022-06-14 LAB — RAD ONC ARIA SESSION SUMMARY
Course Elapsed Days: 9
Plan Fractions Treated to Date: 5
Plan Prescribed Dose Per Fraction: 5 Gy
Plan Total Fractions Prescribed: 5
Plan Total Prescribed Dose: 25 Gy
Reference Point Dosage Given to Date: 25 Gy
Reference Point Session Dosage Given: 5 Gy
Session Number: 5

## 2022-06-14 NOTE — Telephone Encounter (Signed)
Rn called Dr. Ruthine Dose office for pt to check on her return to work paperwork that was dropped off on Monday per pt. Rn received no answer and two voice mails were left for this Dr. Johann Capers. Pt stated she would go by the office herself to check on this paperwork on her way home.

## 2022-06-14 NOTE — Progress Notes (Addendum)
Nurse monitoring complete status post SRS treatment. Patient without complaints. Patient denies new or worsening neurologic symptoms.Pt was monitored for 15 minutes. She did report mild head pressure, but feels this will improved after rest and taking Tylenol at home.  Vitals stable. Instructed patient to avoid strenuous activity for the next 24 hours. Instructed patient to call [phone] with needs related to treatment after hours or over the weekend. Patient verbalized understanding .  Vitals:   06/14/22 1340  BP: 121/77  Pulse: 80  Resp: 20  Temp: 97.7 F (36.5 C)  SpO2: 100%   Pt was able to ambulate out of clinic with her cane (this is her baseline). [facility] was called concerning pt paperwork that was left on Monday. Rn left voicemail for Network engineer to call pt back as soon as she could to follow up on paperwork. Pt stated she would head to the office herself on her way home.

## 2022-06-16 ENCOUNTER — Ambulatory Visit (INDEPENDENT_AMBULATORY_CARE_PROVIDER_SITE_OTHER): Payer: BC Managed Care – PPO | Admitting: Primary Care

## 2022-06-16 ENCOUNTER — Encounter (INDEPENDENT_AMBULATORY_CARE_PROVIDER_SITE_OTHER): Payer: Self-pay | Admitting: Primary Care

## 2022-06-16 VITALS — BP 124/91 | HR 62 | Wt 277.4 lb

## 2022-06-16 DIAGNOSIS — E876 Hypokalemia: Secondary | ICD-10-CM | POA: Diagnosis not present

## 2022-06-16 DIAGNOSIS — E162 Hypoglycemia, unspecified: Secondary | ICD-10-CM | POA: Insufficient documentation

## 2022-06-16 DIAGNOSIS — Z0000 Encounter for general adult medical examination without abnormal findings: Secondary | ICD-10-CM

## 2022-06-16 DIAGNOSIS — E893 Postprocedural hypopituitarism: Secondary | ICD-10-CM

## 2022-06-16 DIAGNOSIS — I10 Essential (primary) hypertension: Secondary | ICD-10-CM

## 2022-06-16 NOTE — Progress Notes (Unsigned)
[place]  Beth [name] is a 46 y.o. female presents to office today for annual physical exam examination.    Concerns today include: 1. Non . Excited just completed radiation treatment.  Occupation: Community education officer , Marital status: D, Substance use: never Diet: as tolerated , Exercise: as tolerated   Health Maintenance  Topic Date Due   COVID-19 Vaccine (4 - 2023-24 season) 02/24/2022   INFLUENZA VACCINE  09/24/2022 (Originally 01/24/2022)   PAP SMEAR-Modifier  08/14/2023   COLONOSCOPY (Pts 45-51yr Insurance coverage will need to be confirmed)  06/24/2029   Hepatitis C Screening  Completed   HIV Screening  Completed   HPV VACCINES  Aged Out   DTaP/Tdap/Td  Discontinued     Past Medical History:  Diagnosis Date   Asthma    Chronic pancreatitis ([facility]    DDD (degenerative disc disease), lumbosacral    DJD (degenerative joint disease)    Headache    Hyperlipidemia    Hypertension    Hypoglycemia    Obesity    Osteoarthritis    Prolactinoma ([place]    Sciatica    Sleep apnea    Social History   Socioeconomic History   Marital status: Divorced    Spouse name: Not on file   Number of children: Not on file   Years of education: Not on file   Highest education level: Not on file  Occupational History   Not on file  Tobacco Use   Smoking status: Never    Passive exposure: Never   Smokeless tobacco: Never  Vaping Use   Vaping Use: Never used  Substance and Sexual Activity   Alcohol use: Never   Drug use: Never   Sexual activity: Yes  Other Topics Concern   Not on file  Social History Narrative   Not on file   Social Determinants of Health   Financial Resource Strain: Not on file  Food Insecurity: Not on file  Transportation Needs: Not on file  Physical Activity: Not on file  Stress: Not on file  Social Connections: Not on file  Intimate Partner Violence: Not on file   Past Surgical History:  Procedure Laterality Date   CHOLECYSTECTOMY      COLONOSCOPY     CRANIOTOMY N/A 01/04/2022   Procedure: Endonasal endoscopic resection of pituitary tumor;  Surgeon: OJudith Part MD;  Location: [facility]  Service: Neurosurgery;  Laterality: N/A;  RM 20   NASAL SINUS SURGERY N/A 01/04/2022   Procedure: ENDOSCOPIC SINUS SURGERY TRANSNASAL PITUITARY REMOVAL WITH NASAL NASOSETTAL FLAP;  Surgeon: SJason Coop DO;  Location: [facility];  Service: ENT;  Laterality: N/A;   Family History  Problem Relation Age of Onset   Other Paternal A44       pituitary surgery   Hypertension Mother    Diabetes Mother    Colon cancer Mother    Arthritis Mother    Thyroid disease Mother    Asthma Mother    Diabetes Father    Hypertension Father    Bell's palsy Father     Current Outpatient Medications:    albuterol (VENTOLIN HFA) 108 (90 Base) MCG/ACT inhaler, Inhale 2 puffs into the lungs every 6 (six) hours as needed for wheezing or shortness of breath., Disp: 8 g, Rfl: 6   Ascorbic Acid (VITAMIN C) 1000 MG tablet, Take 1,000 mg by mouth daily., Disp: , Rfl:    baclofen (LIORESAL) 10 MG tablet, Take 0.5-1 tablets (5-10 mg total) by mouth 3 (three)  times daily as needed for muscle spasms., Disp: 30 each, Rfl: 3   benzonatate (TESSALON) 200 MG capsule, Take 1 capsule (200 mg total) by mouth 3 (three) times daily as needed for cough., Disp: 30 capsule, Rfl: 3   Butenafine HCl (MENTAX) 1 % cream, Apply 1 application topically 2 (two) times daily., Disp: 24 g, Rfl: 1   Cholecalciferol (VITAMIN D3 PO), Take 1 capsule by mouth daily., Disp: , Rfl:    Docusate Sodium (COLACE PO), Take 1 capsule by mouth daily., Disp: , Rfl:    EPINEPHrine 0.3 mg/0.3 mL IJ SOAJ injection, Inject 0.3 mg into the muscle as needed for anaphylaxis., Disp: , Rfl:    fluticasone (FLONASE) 50 MCG/ACT nasal spray, Place 2 sprays into both nostrils daily., Disp: 16 g, Rfl: 3   HYDROcodone-acetaminophen (NORCO/VICODIN) 5-325 MG tablet, Take 1 tablet by mouth every 4 (four) hours as  needed (pain)., Disp: 30 tablet, Rfl: 0   levonorgestrel (MIRENA) 20 MCG/24HR IUD, 1 each by Intrauterine route once. , Disp: , Rfl:    Menthol, Topical Analgesic, (BIOFREEZE) 10 % CREA, Apply 1 Application topically daily as needed (pain)., Disp: , Rfl:    ondansetron (ZOFRAN-ODT) 8 MG disintegrating tablet, Take 1 tablet (8 mg total) by mouth every 8 (eight) hours as needed for nausea or vomiting., Disp: 20 tablet, Rfl: 1   Pancrelipase, Lip-Prot-Amyl, (CREON) 24000-76000 units CPEP, Take 2 capsules by mouth See admin instructions. Take 2 capsules three times daily with food and 1 capsule twice daily with snacks., Disp: , Rfl:    pantoprazole (PROTONIX) 20 MG tablet, Take 20 mg by mouth daily., Disp: , Rfl:    psyllium (REGULOID) 0.52 g capsule, Take 0.52 capsules by mouth daily., Disp: , Rfl:    Zinc Sulfate (ZINC 15 PO), Take 15 mg by mouth daily., Disp: , Rfl:    omega-3 fish oil (MAXEPA) 1000 MG CAPS capsule, Take 1 capsule (1,000 mg total) by mouth 2 (two) times daily. (Patient not taking: Reported on 06/16/2022), Disp: 180 capsule, Rfl: 1   promethazine (PHENERGAN) 25 MG tablet, Take 1 tablet (25 mg total) by mouth every 6 (six) hours as needed for nausea or vomiting. (Patient not taking: Reported on 06/16/2022), Disp: 30 tablet, Rfl: 1   rosuvastatin (CRESTOR) 40 MG tablet, Take 1 tablet (40 mg total) by mouth daily. (Patient not taking: Reported on 06/16/2022), Disp: 90 tablet, Rfl: 3 Outpatient Encounter Medications as of 06/16/2022  Medication Sig Note   albuterol (VENTOLIN HFA) 108 (90 Base) MCG/ACT inhaler Inhale 2 puffs into the lungs every 6 (six) hours as needed for wheezing or shortness of breath.    Ascorbic Acid (VITAMIN C) 1000 MG tablet Take 1,000 mg by mouth daily.    baclofen (LIORESAL) 10 MG tablet Take 0.5-1 tablets (5-10 mg total) by mouth 3 (three) times daily as needed for muscle spasms.    benzonatate (TESSALON) 200 MG capsule Take 1 capsule (200 mg total) by mouth 3  (three) times daily as needed for cough.    Butenafine HCl (MENTAX) 1 % cream Apply 1 application topically 2 (two) times daily.    Cholecalciferol (VITAMIN D3 PO) Take 1 capsule by mouth daily.    Docusate Sodium (COLACE PO) Take 1 capsule by mouth daily.    EPINEPHrine 0.3 mg/0.3 mL IJ SOAJ injection Inject 0.3 mg into the muscle as needed for anaphylaxis. 12/26/2021: Pt needs a refill, current pen is expired   fluticasone (FLONASE) 50 MCG/ACT nasal spray Place 2 sprays  into both nostrils daily.    HYDROcodone-acetaminophen (NORCO/VICODIN) 5-325 MG tablet Take 1 tablet by mouth every 4 (four) hours as needed (pain).    levonorgestrel (MIRENA) 20 MCG/24HR IUD 1 each by Intrauterine route once.     Menthol, Topical Analgesic, (BIOFREEZE) 10 % CREA Apply 1 Application topically daily as needed (pain).    ondansetron (ZOFRAN-ODT) 8 MG disintegrating tablet Take 1 tablet (8 mg total) by mouth every 8 (eight) hours as needed for nausea or vomiting.    Pancrelipase, Lip-Prot-Amyl, (CREON) 24000-76000 units CPEP Take 2 capsules by mouth See admin instructions. Take 2 capsules three times daily with food and 1 capsule twice daily with snacks.    pantoprazole (PROTONIX) 20 MG tablet Take 20 mg by mouth daily.    psyllium (REGULOID) 0.52 g capsule Take 0.52 capsules by mouth daily.    Zinc Sulfate (ZINC 15 PO) Take 15 mg by mouth daily.    omega-3 fish oil (MAXEPA) 1000 MG CAPS capsule Take 1 capsule (1,000 mg total) by mouth 2 (two) times daily. (Patient not taking: Reported on 06/16/2022)    promethazine (PHENERGAN) 25 MG tablet Take 1 tablet (25 mg total) by mouth every 6 (six) hours as needed for nausea or vomiting. (Patient not taking: Reported on 06/16/2022)    rosuvastatin (CRESTOR) 40 MG tablet Take 1 tablet (40 mg total) by mouth daily. (Patient not taking: Reported on 06/16/2022)    No facility-administered encounter medications on file as of 06/16/2022.    Allergies  Allergen Reactions   Bahia  Hives, Shortness Of Breath and Swelling    Found through allergy testing, not sure which item caused the shortness of breath   [place] Grass Hives, Shortness Of Breath and Swelling    Found through allergy testing, not sure which item caused the shortness of breath   SYSCO, Shortness Of Breath and Swelling    Found through allergy testing, not sure which item caused the shortness of breath   Cladosporium Cladosporioides Hives, Shortness Of Breath and Swelling    Found through allergy testing, not sure which item caused the shortness of breath   Dust Mite Extract Hives, Shortness Of Breath and Swelling    Found through allergy testing, not sure which item caused the shortness of breath   Elm Bark [Ulmus Fulva] Hives, Shortness Of Breath and Swelling    Found through allergy testing, not sure which item caused the shortness of breath   Molds & Smuts Hives, Shortness Of Breath and Swelling    Found through allergy testing, not sure which item caused the shortness of breath   Mugwort Hives, Shortness Of Breath and Swelling    Found through allergy testing, not sure which item caused the shortness of breath   Nettle [Nettle (Urtica Dioica)] Hives, Shortness Of Breath and Swelling   Red Mulberry Allergy Skin Test Hives, Shortness Of Breath and Swelling    Found through allergy testing, not sure which item caused the shortness of breath   Sorrel-Dock Mix [Sheep Sorrel-Yellow Dock] Hives, Shortness Of Breath and Swelling    Found through allergy testing, not sure which item caused the shortness of breath   Timothy Grass Pollen Allergen Hives, Shortness Of Breath and Swelling    Found through allergy testing, not sure which item caused the shortness of breath   American Cockroach    Mixed Ragweed      ROS: Review of Systems {ros; complete:30496}    Physical exam {Exam, Complete:[phone]}    Assessment/  Plan: Irma Newness here for annual physical exam.   No problem-specific  Assessment & Plan notes found for this encounter.   Counseled on healthy lifestyle choices, including diet (rich in fruits, vegetables and lean meats and low in salt and simple carbohydrates) and exercise (at least 30 minutes of moderate physical activity daily).  Patient to follow up in 1 year for annual exam or sooner if needed.  The above assessment and management plan was discussed with the patient. The patient verbalized understanding of and has agreed to the management plan. Patient is aware to call the clinic if symptoms persist or worsen. Patient is aware when to return to the clinic for a follow-up visit. Patient educated on when it is appropriate to go to the emergency department.   This note has been created with Surveyor, quantity. Any transcriptional errors are unintentional.   [name], NP 06/16/2022, 10:33 AM

## 2022-06-16 NOTE — Progress Notes (Unsigned)
No concerns. 

## 2022-06-17 LAB — CBC WITH DIFFERENTIAL/PLATELET
Basophils Absolute: 0 10*3/uL (ref 0.0–0.2)
Basos: 0 %
EOS (ABSOLUTE): 0.2 10*3/uL (ref 0.0–0.4)
Eos: 4 %
Hematocrit: 39.7 % (ref 34.0–46.6)
Hemoglobin: 12.8 g/dL (ref 11.1–15.9)
Immature Grans (Abs): 0 10*3/uL (ref 0.0–0.1)
Immature Granulocytes: 0 %
Lymphocytes Absolute: 2.4 10*3/uL (ref 0.7–3.1)
Lymphs: 48 %
MCH: 26.8 pg (ref 26.6–33.0)
MCHC: 32.2 g/dL (ref 31.5–35.7)
MCV: 83 fL (ref 79–97)
Monocytes Absolute: 0.5 10*3/uL (ref 0.1–0.9)
Monocytes: 10 %
Neutrophils Absolute: 2 10*3/uL (ref 1.4–7.0)
Neutrophils: 38 %
Platelets: 311 10*3/uL (ref 150–450)
RBC: 4.78 x10E6/uL (ref 3.77–5.28)
RDW: 15.3 % (ref 11.7–15.4)
WBC: 5.2 10*3/uL (ref 3.4–10.8)

## 2022-06-17 LAB — CMP14+EGFR
ALT: 13 IU/L (ref 0–32)
AST: 18 IU/L (ref 0–40)
Albumin/Globulin Ratio: 1.7 (ref 1.2–2.2)
Albumin: 4.4 g/dL (ref 3.9–4.9)
Alkaline Phosphatase: 104 IU/L (ref 44–121)
BUN/Creatinine Ratio: 8 — ABNORMAL LOW (ref 9–23)
BUN: 8 mg/dL (ref 6–24)
Bilirubin Total: 0.4 mg/dL (ref 0.0–1.2)
CO2: 25 mmol/L (ref 20–29)
Calcium: 9.4 mg/dL (ref 8.7–10.2)
Chloride: 105 mmol/L (ref 96–106)
Creatinine, Ser: 1.01 mg/dL — ABNORMAL HIGH (ref 0.57–1.00)
Globulin, Total: 2.6 g/dL (ref 1.5–4.5)
Glucose: 95 mg/dL (ref 70–99)
Potassium: 4.4 mmol/L (ref 3.5–5.2)
Sodium: 144 mmol/L (ref 134–144)
Total Protein: 7 g/dL (ref 6.0–8.5)
eGFR: 70 mL/min/{1.73_m2} (ref >59)

## 2022-06-17 LAB — TSH+FREE T4
Free T4: 0.91 ng/dL (ref 0.82–1.77)
TSH: 0.287 u[IU]/mL — ABNORMAL LOW (ref 0.450–4.500)

## 2022-06-17 LAB — T3, FREE: T3, Free: 2.4 pg/mL (ref 2.0–4.4)

## 2022-06-17 LAB — VITAMIN D 25 HYDROXY (VIT D DEFICIENCY, FRACTURES): Vit D, 25-Hydroxy: 26.3 ng/mL — ABNORMAL LOW (ref 30.0–100.0)

## 2022-06-20 ENCOUNTER — Encounter (INDEPENDENT_AMBULATORY_CARE_PROVIDER_SITE_OTHER): Payer: Self-pay | Admitting: Primary Care

## 2022-06-22 ENCOUNTER — Ambulatory Visit: Payer: BC Managed Care – PPO | Admitting: Cardiology

## 2022-06-22 ENCOUNTER — Encounter: Payer: Self-pay | Admitting: Cardiology

## 2022-06-22 VITALS — BP 130/83 | HR 87 | Resp 18 | Ht 69.0 in | Wt 279.2 lb

## 2022-06-22 DIAGNOSIS — R0609 Other forms of dyspnea: Secondary | ICD-10-CM | POA: Diagnosis not present

## 2022-06-22 DIAGNOSIS — E782 Mixed hyperlipidemia: Secondary | ICD-10-CM

## 2022-06-22 DIAGNOSIS — I479 Paroxysmal tachycardia, unspecified: Secondary | ICD-10-CM

## 2022-06-22 DIAGNOSIS — G4733 Obstructive sleep apnea (adult) (pediatric): Secondary | ICD-10-CM

## 2022-06-22 DIAGNOSIS — D352 Benign neoplasm of pituitary gland: Secondary | ICD-10-CM | POA: Diagnosis not present

## 2022-06-22 DIAGNOSIS — I10 Essential (primary) hypertension: Secondary | ICD-10-CM

## 2022-06-22 DIAGNOSIS — U071 COVID-19: Secondary | ICD-10-CM

## 2022-06-22 NOTE — Progress Notes (Signed)
ID:  Beth [name], DOB [birth year], MRN [id number]  PCP:  [name], NP  Cardiologist:  Rex Kras, DO, [facility] (established care 02/19/2020)  Date: 06/22/22 Last Office Visit: 04/01/2021   Chief Complaint  Patient presents with   Follow-up    Annual follow up     HPI  Tuleen Mehan is a 46 y.o. female whose past medical history and cardiovascular risk factors include: Sleep apnea on CPAP history of COVID-19 infection, hyperlipidemia, hypertension, obesity due to excess calories.  Patient was referred to the practice for evaluation of tachycardia with effort related activities back in 2021 after a prolonged COVID infection/sequelae which led to hypoxia requiring nasal cannula oxygen and the need for wheelchair assistance with prolonged ambulation.  Since establishing care she has undergone echocardiogram, stress test, and a Zio patch.  Her symptoms overall has stabilized and she presents today for 1 year follow-up visit.  Over the last 1 year patient is doing well from a cardiovascular standpoint.  She has shortness of breath with effort related activities predominantly with overexertion.  She tries to ambulate 3000 steps per day.  She was recently diagnosed with pituitary adenoma and underwent surgical correction as well as radiation.  Her weight is approximately 3 pounds heavier than last.  She does follow-up with pulmonary medicine given her underlying shortness of breath/history of COVID infection/prior need for oxygen therapy.  Her overall functional capacity has improved compared to last year.  She does not endorse exertional chest pain.  Unfortunately, her brother at the age of 73 passed away from a cardiac arrest postsurgery that was performed for diverticulosis/diverticulitis.    ALLERGIES: Allergies  Allergen Reactions   Bahia Hives, Shortness Of Breath and Swelling    Found through allergy testing, not sure which item caused the shortness of breath   [place]  Grass Hives, Shortness Of Breath and Swelling    Found through allergy testing, not sure which item caused the shortness of breath   SYSCO, Shortness Of Breath and Swelling    Found through allergy testing, not sure which item caused the shortness of breath   Cladosporium Cladosporioides Hives, Shortness Of Breath and Swelling    Found through allergy testing, not sure which item caused the shortness of breath   Dust Mite Extract Hives, Shortness Of Breath and Swelling    Found through allergy testing, not sure which item caused the shortness of breath   Elm Bark [Ulmus Fulva] Hives, Shortness Of Breath and Swelling    Found through allergy testing, not sure which item caused the shortness of breath   Molds & Smuts Hives, Shortness Of Breath and Swelling    Found through allergy testing, not sure which item caused the shortness of breath   Mugwort Hives, Shortness Of Breath and Swelling    Found through allergy testing, not sure which item caused the shortness of breath   Nettle [Nettle (Urtica Dioica)] Hives, Shortness Of Breath and Swelling   Red Mulberry Allergy Skin Test Hives, Shortness Of Breath and Swelling    Found through allergy testing, not sure which item caused the shortness of breath   Sorrel-Dock Mix [Sheep Sorrel-Yellow Dock] Hives, Shortness Of Breath and Swelling    Found through allergy testing, not sure which item caused the shortness of breath   Timothy Grass Pollen Allergen Hives, Shortness Of Breath and Swelling    Found through allergy testing, not sure which item caused the shortness of breath   American Cockroach  Mixed Ragweed     MEDICATION LIST PRIOR TO VISIT: Current Meds  Medication Sig   albuterol (VENTOLIN HFA) 108 (90 Base) MCG/ACT inhaler Inhale 2 puffs into the lungs every 6 (six) hours as needed for wheezing or shortness of breath.   Ascorbic Acid (VITAMIN C) 1000 MG tablet Take 1,000 mg by mouth daily.   baclofen (LIORESAL) 10 MG tablet Take  0.5-1 tablets (5-10 mg total) by mouth 3 (three) times daily as needed for muscle spasms.   benzonatate (TESSALON) 200 MG capsule Take 1 capsule (200 mg total) by mouth 3 (three) times daily as needed for cough.   Butenafine HCl (MENTAX) 1 % cream Apply 1 application topically 2 (two) times daily.   Cholecalciferol (VITAMIN D3 PO) Take 2 capsules by mouth daily.   Docusate Sodium (COLACE PO) Take 1 capsule by mouth daily.   EPINEPHrine 0.3 mg/0.3 mL IJ SOAJ injection Inject 0.3 mg into the muscle as needed for anaphylaxis.   fluticasone (FLONASE) 50 MCG/ACT nasal spray Place 2 sprays into both nostrils daily.   HYDROcodone-acetaminophen (NORCO/VICODIN) 5-325 MG tablet Take 1 tablet by mouth every 4 (four) hours as needed (pain).   levonorgestrel (MIRENA) 20 MCG/24HR IUD 1 each by Intrauterine route once.    Menthol, Topical Analgesic, (BIOFREEZE) 10 % CREA Apply 1 Application topically daily as needed (pain).   ondansetron (ZOFRAN-ODT) 8 MG disintegrating tablet Take 1 tablet (8 mg total) by mouth every 8 (eight) hours as needed for nausea or vomiting.   Pancrelipase, Lip-Prot-Amyl, (CREON) 24000-76000 units CPEP Take 2 capsules by mouth See admin instructions. Take 2 capsules three times daily with food and 1 capsule twice daily with snacks.   pantoprazole (PROTONIX) 20 MG tablet Take 20 mg by mouth daily.   psyllium (REGULOID) 0.52 g capsule Take 0.52 capsules by mouth daily.   Zinc Sulfate (ZINC 15 PO) Take 15 mg by mouth daily.     PAST MEDICAL HISTORY: Past Medical History:  Diagnosis Date   Asthma    Chronic pancreatitis ([place])    DDD (degenerative disc disease), lumbosacral    DJD (degenerative joint disease)    Headache    Hyperlipidemia    Hypertension    Hypoglycemia    Obesity    Osteoarthritis    Prolactinoma ([place])    Sciatica    Sleep apnea     PAST SURGICAL HISTORY: Past Surgical History:  Procedure Laterality Date   CHOLECYSTECTOMY     COLONOSCOPY     CRANIOTOMY  N/A 01/04/2022   Procedure: Endonasal endoscopic resection of pituitary tumor;  Surgeon: Judith Part, MD;  Location: [place];  Service: Neurosurgery;  Laterality: N/A;  RM 20   NASAL SINUS SURGERY N/A 01/04/2022   Procedure: ENDOSCOPIC SINUS SURGERY TRANSNASAL PITUITARY REMOVAL WITH NASAL NASOSETTAL FLAP;  Surgeon: Skotnicki, Meghan A, DO;  Location: [facility];  Service: ENT;  Laterality: N/A;    FAMILY HISTORY: The patient family history includes Arthritis in her mother; Asthma in her mother; Bell's palsy in her father; Cancer in her brother; Colon cancer in her mother; Diabetes in her father and mother; Heart failure in her brother; Heart murmur in her sister; Hypertension in her brother, father, mother, sister, and sister; Lupus in her sister; Osteoporosis in her mother; Other in her paternal aunt; Polycystic ovary syndrome in her sister; Scoliosis in her sister; Thyroid disease in her mother.  SOCIAL HISTORY:  The patient  reports that she has never smoked. She has never been exposed to  tobacco smoke. She has never used smokeless tobacco. She reports that she does not drink alcohol and does not use drugs.  REVIEW OF SYSTEMS: Review of Systems  Eyes:  Positive for photophobia.  Cardiovascular:  Positive for dyspnea on exertion (Chronic and stable). Negative for chest pain, claudication, irregular heartbeat, leg swelling, near-syncope, orthopnea, palpitations, paroxysmal nocturnal dyspnea and syncope.  Respiratory:  Negative for shortness of breath.   Hematologic/Lymphatic: Negative for bleeding problem.  Musculoskeletal:  Negative for muscle cramps and myalgias.  Neurological:  Negative for dizziness and light-headedness.    PHYSICAL EXAM:    06/22/2022    9:02 AM 06/16/2022    9:59 AM 06/14/2022    1:40 PM  Vitals with BMI  Height '5\' 9"'$   '5\' 9"'$   Weight 279 lbs 3 oz 277 lbs 6 oz   BMI 16.10 96.04   Systolic 540 981 191  Diastolic 83 91 77  Pulse 87 62 80   Physical Exam   Constitutional: No distress.  Age appropriate, hemodynamically stable, walks w/ cane  Neck: No JVD present.  Cardiovascular: Normal rate, regular rhythm, S1 normal, S2 normal, intact distal pulses and normal pulses. Exam reveals no gallop, no S3 and no S4.  No murmur heard. Pulmonary/Chest: Effort normal and breath sounds normal. No stridor. She has no wheezes. She has no rales.  Abdominal: Soft. Bowel sounds are normal. She exhibits no distension. There is no abdominal tenderness.  Musculoskeletal:        General: No edema.     Cervical back: Neck supple.  Neurological: She is alert and oriented to person, place, and time. She has intact cranial nerves (2-12).  Skin: Skin is warm and moist.    CARDIAC DATABASE: EKG: 06/14/2022: Sinus rhythm, 83 bpm, without underlying injury pattern.  No significant change compared to 04/01/2021.  Echocardiogram: 11/29/2009 performed at [facility] heart center: Patient was provided the records. LVEF greater than 55%.  Trace MR, trace TR, trileaflet aortic valve, trace PR, no pericardial effusion.  03/12/2020:  Left ventricle cavity is normal in size and wall thickness. Normal global wall motion. Normal LV systolic function with EF 68%. Normal diastolic filling pattern.  Mild (Grade I) mitral regurgitation.  Mild tricuspid regurgitation.  No evidence of pulmonary hypertension.   Stress Testing: Lexiscan Sestamibi stress test 03/03/2020:  Lexiscan nuclear stress test performed using 1-day protocol.  Normal myocardial perfusion. Stress LVEF 78%.  Low risk study.   Heart Catheterization: None  7 day extended Holter monitor:  Dominant rhythm normal sinus.  Heart rate 52-139 bpm.  Average heart rate 83 bpm.  No atrial fibrillation, supraventricular tachycardia, ventricular tachycardia, high grade AV block, sinus pause greater than or equal to 3 seconds in duration.  Total ventricular ectopic burden <1%.  Total supraventricular ectopic burden <1%.   Patient triggered events: 1.  Underlying rhythm sinus tachycardia without dysrhythmias.   LABORATORY DATA:    Latest Ref Rng & Units 06/16/2022   10:48 AM 01/05/2022    2:48 AM 01/04/2022    3:19 PM  CBC  WBC 3.4 - 10.8 x10E3/uL 5.2  11.8  6.8   Hemoglobin 11.1 - 15.9 g/dL 12.8  11.4  11.9   Hematocrit 34.0 - 46.6 % 39.7  34.2  35.2   Platelets 150 - 450 x10E3/uL 311  306  294        Latest Ref Rng & Units 06/16/2022   10:48 AM 05/22/2022    4:06 PM 03/14/2022    9:37 AM  CMP  Glucose 70 - 99 mg/dL 95     BUN 6 - 24 mg/dL 8  9    Creatinine 0.57 - 1.00 mg/dL 1.01  0.86    Sodium 134 - 144 mmol/L 144   141   Potassium 3.5 - 5.2 mmol/L 4.4     Chloride 96 - 106 mmol/L 105     CO2 20 - 29 mmol/L 25     Calcium 8.7 - 10.2 mg/dL 9.4     Total Protein 6.0 - 8.5 g/dL 7.0     Total Bilirubin 0.0 - 1.2 mg/dL 0.4     Alkaline Phos 44 - 121 IU/L 104     AST 0 - 40 IU/L 18     ALT 0 - 32 IU/L 13       Lipid Panel  Lab Results  Component Value Date   CHOL 130 05/31/2022   HDL 42 05/31/2022   LDLCALC 72 05/31/2022   TRIG 81 05/31/2022   CHOLHDL 3.1 05/31/2022    No components found for: "NTPROBNP" No results for input(s): "PROBNP" in the last 8760 hours. Recent Labs    01/09/22 1248 03/14/22 0937 06/16/22 1048  TSH 0.026* 0.71 0.287*    BMP Recent Labs    01/07/22 0248 01/07/22 0939 01/13/22 0500 03/14/22 0937 05/22/22 1606 06/16/22 1048  NA 141   < > 140 141  --  144  K 4.2  --  3.8  --   --  4.4  CL 109  --  107  --   --  105  CO2 27  --  26  --   --  25  GLUCOSE 114*  --  105*  --   --  95  BUN 11  --  8  --  9 8  CREATININE 0.90  --  0.81  --  0.86 1.01*  CALCIUM 8.8*  --  8.8*  --   --  9.4  GFRNONAA >60  --  >60  --  >60  --    < > = values in this interval not displayed.     HEMOGLOBIN A1C No results found for: "HGBA1C", "MPG"  IMPRESSION:    ICD-10-CM   1. Tachycardia, paroxysmal (HCC)  I47.9 EKG 12-Lead    2. History of pneumonia due to  COVID-19 virus  U07.1    J12.82     3. Dyspnea on exertion  R06.09     4. Mixed hyperlipidemia  E78.2     5. Obstructive sleep apnea  G47.33     6. Benign hypertension  I10     7. Class 3 severe obesity due to excess calories with serious comorbidity and body mass index (BMI) of 40.0 to 44.9 in adult [facility])  E66.01    Z68.41        RECOMMENDATIONS: Karrisa Quiggle is a 46 y.o. female whose past medical history and cardiac risk factors include: History of Sleep Apnea on CPAP, COVID-19 infection, hyperlipidemia, hypertension, obesity due to excess calories.  Tachycardia, paroxysmal ([place]) Well-controlled. Likely physiological given her underlying thyroid function, underlying deconditioning Noticeable with overexertion. EKG: Nonischemic. Has undergone echo, MPI, and Zio patch in the past. Overall functional capacity has improved since last office visit.  Dyspnea on exertion Chronic and stable. Now present with over exertional activity and not at baseline. No longer requiring nasal cannula oxygen Follows up with pulmonary medicine-states that prior PFTs have noted reduced functional capacity Monitor for now Was to encourage increasing physical activity as  tolerated.  She is asked to be cognizant of her symptoms and if they change in intensity, frequency, and/or duration she needs to be reevaluated.  Mixed hyperlipidemia Was on atorvastatin in the past. Most recent lipid profile notes stable LDL. Currently managed by primary care provider.  Obstructive sleep apnea Discovered during her workup of dyspnea on exertion. Given her recent adenoma surgery she is not using her CPAP currently.  Benign hypertension Office blood pressures are well-controlled. Continue current medical therapy.  Class 3 severe obesity due to excess calories with serious comorbidity and body mass index (BMI) of 40.0 to 44.9 in adult [facility]) Body mass index is 41.23 kg/m. I reviewed with the patient the  importance of diet, regular physical activity/exercise, weight loss.   Patient is educated on increasing physical activity gradually as tolerated.  With the goal of moderate intensity exercise for 30 minutes a day 5 days a week.  No medication changes made at today's office visit.  No additional Diagnostic testing ordered.  We discussed following up on an annual basis versus as needed.  Patient prefers annual follow-up given her risk factors and family history.  Outside labs from December 2023 independently reviewed and noted above for further reference.  FINAL MEDICATION LIST END OF ENCOUNTER: No orders of the defined types were placed in this encounter.    Current Outpatient Medications:    albuterol (VENTOLIN HFA) 108 (90 Base) MCG/ACT inhaler, Inhale 2 puffs into the lungs every 6 (six) hours as needed for wheezing or shortness of breath., Disp: 8 g, Rfl: 6   Ascorbic Acid (VITAMIN C) 1000 MG tablet, Take 1,000 mg by mouth daily., Disp: , Rfl:    baclofen (LIORESAL) 10 MG tablet, Take 0.5-1 tablets (5-10 mg total) by mouth 3 (three) times daily as needed for muscle spasms., Disp: 30 each, Rfl: 3   benzonatate (TESSALON) 200 MG capsule, Take 1 capsule (200 mg total) by mouth 3 (three) times daily as needed for cough., Disp: 30 capsule, Rfl: 3   Butenafine HCl (MENTAX) 1 % cream, Apply 1 application topically 2 (two) times daily., Disp: 24 g, Rfl: 1   Cholecalciferol (VITAMIN D3 PO), Take 2 capsules by mouth daily., Disp: , Rfl:    Docusate Sodium (COLACE PO), Take 1 capsule by mouth daily., Disp: , Rfl:    EPINEPHrine 0.3 mg/0.3 mL IJ SOAJ injection, Inject 0.3 mg into the muscle as needed for anaphylaxis., Disp: , Rfl:    fluticasone (FLONASE) 50 MCG/ACT nasal spray, Place 2 sprays into both nostrils daily., Disp: 16 g, Rfl: 3   HYDROcodone-acetaminophen (NORCO/VICODIN) 5-325 MG tablet, Take 1 tablet by mouth every 4 (four) hours as needed (pain)., Disp: 30 tablet, Rfl: 0   levonorgestrel  (MIRENA) 20 MCG/24HR IUD, 1 each by Intrauterine route once. , Disp: , Rfl:    Menthol, Topical Analgesic, (BIOFREEZE) 10 % CREA, Apply 1 Application topically daily as needed (pain)., Disp: , Rfl:    ondansetron (ZOFRAN-ODT) 8 MG disintegrating tablet, Take 1 tablet (8 mg total) by mouth every 8 (eight) hours as needed for nausea or vomiting., Disp: 20 tablet, Rfl: 1   Pancrelipase, Lip-Prot-Amyl, (CREON) 24000-76000 units CPEP, Take 2 capsules by mouth See admin instructions. Take 2 capsules three times daily with food and 1 capsule twice daily with snacks., Disp: , Rfl:    pantoprazole (PROTONIX) 20 MG tablet, Take 20 mg by mouth daily., Disp: , Rfl:    psyllium (REGULOID) 0.52 g capsule, Take 0.52 capsules by mouth daily.,  Disp: , Rfl:    Zinc Sulfate (ZINC 15 PO), Take 15 mg by mouth daily., Disp: , Rfl:   Orders Placed This Encounter  Procedures   EKG 12-Lead   There are no Patient Instructions on file for this visit.   --Continue cardiac medications as reconciled in final medication list. --No follow-ups on file. Or sooner if needed. --Continue follow-up with your primary care physician regarding the management of your other chronic comorbid conditions.  Patient's questions and concerns were addressed to her satisfaction. She voices understanding of the instructions provided during this encounter.   This note was created using a voice recognition software as a result there may be grammatical errors inadvertently enclosed that do not reflect the nature of this encounter. Every attempt is made to correct such errors.  Total time spent: 22 minutes.  Rex Kras, [place], [facility]  Pager: [phone] Office: [phone]

## 2022-06-30 DIAGNOSIS — G4733 Obstructive sleep apnea (adult) (pediatric): Secondary | ICD-10-CM | POA: Diagnosis not present

## 2022-06-30 NOTE — Radiation Completion Notes (Signed)
Patient Name: Beth [name], Beth [name] MRN: [id number] Date of Birth: 26-May-1976 Referring Physician: [name], M.D. Date of Service: 2022-06-30 Radiation Oncologist: Eppie Gibson, M.D. [place]                             Radiation Oncology End of Treatment Note     Diagnosis: D35.2 Benign neoplasm of pituitary gland Intent: Curative     ==========DELIVERED PLANS==========  First Treatment Date: 2022-06-05 - Last Treatment Date: 2022-06-14   Plan Name: Brain_SRT1 Site: Brain Technique: SBRT/SRT-IMRT Mode: Photon Dose Per Fraction: 5 Gy Prescribed Dose (Delivered / Prescribed): 25 Gy / 25 Gy Prescribed Fxs (Delivered / Prescribed): 5 / 5     ==========ON TREATMENT VISIT DATES========== 2022-06-05, 2022-06-07, 2022-06-09, 2022-06-12, 2022-06-14     ==========UPCOMING VISITS==========       ==========APPENDIX - ON TREATMENT VISIT NOTES==========

## 2022-07-03 ENCOUNTER — Ambulatory Visit (INDEPENDENT_AMBULATORY_CARE_PROVIDER_SITE_OTHER): Payer: BC Managed Care – PPO | Admitting: Primary Care

## 2022-07-05 ENCOUNTER — Encounter (INDEPENDENT_AMBULATORY_CARE_PROVIDER_SITE_OTHER): Payer: Self-pay | Admitting: Primary Care

## 2022-07-07 ENCOUNTER — Encounter: Payer: Self-pay | Admitting: Internal Medicine

## 2022-07-07 ENCOUNTER — Ambulatory Visit: Payer: BC Managed Care – PPO | Admitting: Internal Medicine

## 2022-07-07 VITALS — BP 130/82 | HR 61 | Ht 69.0 in | Wt 279.2 lb

## 2022-07-07 DIAGNOSIS — D352 Benign neoplasm of pituitary gland: Secondary | ICD-10-CM | POA: Diagnosis not present

## 2022-07-07 NOTE — Progress Notes (Signed)
Mr. Beth [name] presents today for follow up after completing [place] treatment for a pituitary mass. She completed treatment on 06-14-22.   Recent neurologic symptoms, if any:  Seizures: None Headaches: Reports occasional headaches to her occipital region or a pressure above her eyes which can cause light sensitvity; Reports these generally improve with oral Tylenol and lying down Nausea: Reports nausea resolved around 07/08/22-07/09/22 Wt Readings from Last 3 Encounters:  07/18/22 279 lb 12.8 oz (126.9 kg)  07/07/22 279 lb 3.2 oz (126.6 kg)  06/22/22 279 lb 3.2 oz (126.6 kg)   Dizziness/ataxia: Continues to feel dizzy when she changes position (from sitting to standing). Reports she's diligent about standing still and getting acclimated, so she denies any falls Difficulty with hand coordination: Denies any changes Focal numbness/weakness: Reports continued weakness to left side of her body. Ambulate with a cane for added support Visual deficits/changes: Reports occasional light sensitvity. Continues to deal with altered field of vision, especially with her left eye. Has to use 2 monitors at work.  Confusion/Memory deficits: Reports an increase in struggling to find particular words, and feels like her cognition is more sluggish ("I feel like I'm slower that I was and second guess myself more. Not as articulate as I was before surgery and treatment. I have continued brain fog from [place]").   Other issues of note: Reports fatigue is still present, but has improved from every today to every other day. Continues to deal with bilateral sciatica and stiffness with movements. Reports bouts of random/intense pain to the bottoms of her feet that occur when she stands to walk (states she never knows when it's going to occur so she can never prepare herself and is always caught off guard)

## 2022-07-07 NOTE — Patient Instructions (Signed)
Please come back for labs fasting, between 8-9 am.  You should have an endocrinology follow-up appointment in 6 months.

## 2022-07-07 NOTE — Progress Notes (Signed)
Patient ID: Beth [name], female   DOB: 25-Jul-1975, 47 y.o.   MRN: [id number]  HPI  Beth [name] is a 47 y.o.-year-old female, presenting for follow-up for pituitary macroadenoma, now s/p transsphenoidal surgery.  She previously saw Dr. Loanne Drilling, but last visit with me, 4 months ago.  Interim history: She denies increased thirst and urination, despite stopping DDAVP since last visit.  She still has some urinary incontinence, which is chronic.  She sees urology. No headaches or visual disturbance. She has fatigue, but this improved after being able to start back on the CPAP 06/26/2022. Also, she continues to have postnasal drip, but this is improved.  She has some coughing from this.  She sees pulmonology-Dr. Lamonte Sakai. Since last visit, she had stereotactic radiation therapy to her pituitary tumor.  She developed facial swelling, coughing, nausea and vomiting during RxTx but was able to complete the 5 treatments. She has more hair loss lately. She has a VF coming up 01/27/2023.  Reviewed and addended history:  Pt. was found to have a high prolactin level in 2007 when she presented with galactorrhea. She was started on Metformin. 1 Month later, she got pregnant.  She stopped Metformin when pregnancy was detected.  In 2013, she saw another ObGyn for current galactorrhea.  Pregnancy was ruled out. PRL was high. She was started on Bromocriptine.  She moved to [place] in 2015. She continued Bromocriptine.  She was referred to Dr. Loanne Drilling in 01/2018.  She was still on bromocriptine when she started to see him and she was maintained on a low-dose by Dr. Loanne Drilling.  In 06/2021, PRL again returned high >> Bromocriptine was increased from 2.5 to 5 mg daily but she could not tolerate it >> back to 2.5 mg.  She then saw ophthalmology >> Bilateral VF cut >> rec'd an MRI >> sent for a pituitary MRI (10/2021).   This showed a very large pituitary tumor, consistent with a macroadenoma with significant mass effect  on the optic chiasm: Pituitary MRI (11/12/2021): Brain: Very large heterogeneously enhancing sellar/suprasellar mass, which involves the pituitary gland and measures up to 4.1 x 3.4 x 4.1 cm (AP by transverse by craniocaudal). Significant suprasellar extension with significant mass effect on the optic chiasm. Tumor extends into the sphenoid sinuses bilaterally. Definite right and probable left cavernous sinus invasion with apparent encasement of the right greater than left internal carotid arteries.   No evidence of acute infarct, acute hemorrhage, hydrocephalus, midline shift, or extra-axial fluid collection. No pathologic enhancement outside of the pituitary/sella.   Vascular: Major arterial flow voids are maintained at the skull base.   Skull and upper cervical spine: Normal marrow signal.   Sinuses/Orbits: Tumor within the sphenoid sinuses, described above.  Otherwise, sinuses are largely clear.   Other: No mastoid effusions.ac   IMPRESSION: Very large 4.1 x 3.4 by 4.1 cm sellar/suprasellar mass with significant mass effect on the optic chiasm, cavernous sinus invasion, and extension into the sphenoid sinus, probably a pituitary macroadenoma. Recommend neurosurgical consultation.      The MRI results return to me after Dr. Loanne Drilling left the practice.  We checked her pituitary hormones and these were normal.    I referred her to neurosurgery and she had transsphenoidal surgery 01/04/2022 by Dr. Venetia Constable >> debulking, with residual pituitary tumor in the cavernous sinus  Pathology showed: Pituitary tumor pathology was reviewed but this was reported only as neuroendocrine tumor, but we asked for further staining, which actually showed a gonadotropin origin: A. PITUITARY TUMOR, RESECTION:  Pituitary neuroendocrine tumor (adenoma of anterior pituitary gland), fragmented. There is no tumor necrosis. Adjacent strips of normal bone and nasal mucosa. There are no cytologic features  diagnostic of malignancy.  GROSS DESCRIPTION: Received fresh are portions of soft tan-pink to hemorrhagic tissue measuring 2.5 x 2.5 x 0.8 cm in aggregate.  There is a 1 cm portion of bone present.  The specimen is entirely submitted in 4 cassettes including decalcified bone.  (GRP 01/04/2022)  The case was sent to [facility] for performing additional immunostains. The immunostains show that the tumor cells are positive for Steroidogenic factor 1 (SF-1) and are negative for ACTH, prolactin, FSH, LH, GH and TSH.  Pituitary MRI obtained after surgery (02/20/2022): Showed large amount of persistent tissue in the right cavernous sinus:  Resolved suprasellar mass effect including on the optic chiasm (series 3, image 14). Intraorbital soft tissues remain stable and within normal limits. Postoperative changes to the posterior ethmoid and sphenoid sinuses, see additional details below. Mastoids remain clear. Visible internal auditory structures appear normal.   Other: Interval intra-sellar and suprasellar resection of tumor. Resolve suprasellar mass effect. Hypothalamus is within normal limits. Infundibulum is now visible, with some downward traction (series 12, image 8). Infundibulum is also deviated into the left sella turcica (series 14, image 8). Subjacent fairly homogeneously enhancing soft tissue there is likely normal pituitary. But this is inseparable from heterogeneously enhancing residual tumor which tracks posteriorly and laterally from the sella turcica into and across the right cavernous sinus as before. Confluent residual tumor is estimated at 26 (series 12, image 4) x 30 x 17 mm (images 6 and 7 of series 14), AP by transverse by CC. Right IAC siphon is engulfed as left-side/intra sellar tumor margin with the residual pituitary is difficult to discern on some images. But the left cavernous sinus appears to remain spared.   Superimposed abnormal signal and enhancement in the  postoperative sphenoid sinuses. In the midline and to the right of midline most of this is intrinsically T1 hyperintense (series 9, image 10) compatible with postoperative fat and/or inspissated secretions. Rim enhancing, but centrally T1 and T2 hypointense abnormality throughout the central and left sphenoid sinus appears fairly distinct from the residual tumor in the cavernous sinus and is probably paranasal sinus mucosal disease and/or granulation tissue with inspissated secretions. ...   IMPRESSION: 1. Interval debulking of intra-sellar and suprasellar pituitary adenoma. Resolved suprasellar mass effect. Residual tumor with epicenter at the right cavernous sinus is approximately 2.6 x 3.0 x 1.7 cm. See series 14, images 6 and 7. Left cavernous sinus seems spared. Abnormal mixed signal and enhancing material throughout both sphenoid sinuses is favored to be postoperative and/or sinus mucosal disease related. Attention is directed on follow-up.    After surgery, her TSH was low, but she was not started on levothyroxine pending further investigation. Also, she developed increased thirst and urination was started on DDAVP 0.1 mg twice a day. The rest of the hormones were not checked at that time. After the surgery, we got in contact and I advised her to stop bromocriptine.  I saw the patient for the first time in 02/2022 and repeated her pituitary hormones and they were again normal.  Pituitary MRI (05/25/2022): Brain: Postsurgical changes reflecting prior trans-sphenoidal pituitary mass resection are again seen. Residual adenoma centered in the right cavernous sinus measures approximately 2.2 cm AP x 2.7 cm TV in the axial plane (19-52) and 2.9 cm TV x 1.4 cm cc in the coronal plane (18-9).  The size of residual tumor is not significantly changed compared to the prior study allowing for slight differences in measurement technique and slice selection. There is unchanged cavernous  sinus invasion and encasement of the cavernous ICA. There is no extent into the suprasellar cistern or mass effect on the chiasm. There is unchanged leftward deviation of the pituitary stalk.   Previously seen enhancement in the adjacent sphenoid sinuses has decreased, favored to reflect evolving postoperative change as opposed to residual tumor. ...   IMPRESSION: 1. Since 02/18/2022, no significant interval change in size or appearance of the residual pituitary adenoma centered in the right cavernous sinus. 2. Decreased mucosal thickening and enhancement in the sphenoid sinuses, consistent with evolving postoperative change.   In 05/2022, she had stereotactic radiation therapy - 5 sessions - every other day. Last session 06/14/2022.  I reviewed pt's pertinent tests: Lab Results  Component Value Date   PROLACTIN 15.1 03/14/2022   PROLACTIN 1.8 (L) 10/21/2021   PROLACTIN 40.0 (H) 07/15/2021   PROLACTIN 32.1 (H) 06/11/2020   PROLACTIN 5.4 05/15/2019   PROLACTIN 32.9 (H) 04/03/2019   PROLACTIN 5.5 03/22/2018   PROLACTIN 34.9 (H) 02/15/2018   Component Ref Range & Units 3 mo ago  PROLACTIN,UNDILUTED 2.0 - 30.0 ng/mL 2.8   PROLACTIN,DILUTED ng/mL   Comment: Result confirmed by 1:100 dilution. No high dose  hook effect detected.              Reference Range   Females          Non-pregnant        3.0-30.0          Pregnant           10.0-209.0          Postmenopausal      2.0-20.0     Lab Results  Component Value Date   TSH 0.287 (L) 06/16/2022   TSH 0.71 03/14/2022   TSH 0.026 (L) 01/09/2022   TSH 0.73 11/15/2021   TSH 0.71 10/21/2021   TSH 1.030 06/13/2021   TSH 0.84 06/11/2020   TSH 0.440 (L) 02/19/2020   TSH 1.24 04/03/2019   TSH 0.42 02/15/2018   FREET4 0.91 06/16/2022   FREET4 0.65 03/14/2022   FREET4 1.00 01/09/2022   FREET4 0.70 11/15/2021   FREET4 1.0 10/21/2021   FREET4 0.99 06/13/2021   FREET4 0.72 06/11/2020   FREET4 1.27 02/24/2020   FREET4 0.78  02/15/2018    Lab Results  Component Value Date   T3FREE 2.4 06/16/2022   T3FREE 2.8 03/14/2022   T3FREE 2.6 11/15/2021   Component     Latest Ref Rng 03/14/2022  IGF-I, LC/MS     52 - 328 ng/mL 115   Z-Score (Female)     -2.0 - 2.0 SD -0.5   Sodium     135 - 145 mEq/L 141   Cortisol, Plasma     ug/dL 7.8   FSH     mIU/ML 6.9   LH     mIU/mL 3.51   C206 ACTH     6 - 50 pg/mL 23    Component     Latest Ref Rng 11/15/2021 01/09/2022  IGF-I, LC/MS     52 - 328 ng/mL 124    Z-Score (Female)     -2.0 - 2.0 SD -0.3    FSH     mIU/ML 5.8    LH     mIU/mL 2.28    Cortisol, Plasma     ug/dL  6.0  18.7   C206 ACTH     6 - 50 pg/mL 22     At our visit from 02/2022, I advised her to try to stop the DDAVP.  She was able to do so.  Sodium levels are normal: Lab Results  Component Value Date   NA 144 06/16/2022   NA 141 03/14/2022   NA 140 01/13/2022   NA 140 01/12/2022   NA 141 01/11/2022   NA 141 01/11/2022   NA 141 01/10/2022   NA 141 01/10/2022   NA 142 01/09/2022   NA 141 01/09/2022   She has a paternal aunt with a Prolactinoma  >> resected.  Pt. also has a history of chronic pancreatitis, DDD, HTN, HL, obesity, sciatica.  ROS: + see HPI  Past Medical History:  Diagnosis Date   Asthma    Chronic pancreatitis ([place])    DDD (degenerative disc disease), lumbosacral    DJD (degenerative joint disease)    Headache    Hyperlipidemia    Hypertension    Hypoglycemia    Obesity    Osteoarthritis    Prolactinoma ([place])    Sciatica    Sleep apnea    Past Surgical History:  Procedure Laterality Date   CHOLECYSTECTOMY     COLONOSCOPY     CRANIOTOMY N/A 01/04/2022   Procedure: Endonasal endoscopic resection of pituitary tumor;  Surgeon: Judith Part, MD;  Location: [facility];  Service: Neurosurgery;  Laterality: N/A;  RM 20   NASAL SINUS SURGERY N/A 01/04/2022   Procedure: ENDOSCOPIC SINUS SURGERY TRANSNASAL PITUITARY REMOVAL WITH NASAL NASOSETTAL FLAP;   Surgeon: Jason Coop, DO;  Location: [facility];  Service: ENT;  Laterality: N/A;   Social History   Socioeconomic History   Marital status: Divorced    Spouse name: Not on file   Number of children: 3   Years of education: Not on file   Highest education level: Not on file  Occupational History   Not on file  Tobacco Use   Smoking status: Never    Passive exposure: Never   Smokeless tobacco: Never  Vaping Use   Vaping Use: Never used  Substance and Sexual Activity   Alcohol use: Never   Drug use: Never   Sexual activity: Yes  Other Topics Concern   Not on file  Social History Narrative   Not on file   Social Determinants of Health   Financial Resource Strain: Not on file  Food Insecurity: Not on file  Transportation Needs: Not on file  Physical Activity: Not on file  Stress: Not on file  Social Connections: Not on file  Intimate Partner Violence: Not on file   Current Outpatient Medications on File Prior to Visit  Medication Sig Dispense Refill   albuterol (VENTOLIN HFA) 108 (90 Base) MCG/ACT inhaler Inhale 2 puffs into the lungs every 6 (six) hours as needed for wheezing or shortness of breath. 8 g 6   Ascorbic Acid (VITAMIN C) 1000 MG tablet Take 1,000 mg by mouth daily.     baclofen (LIORESAL) 10 MG tablet Take 0.5-1 tablets (5-10 mg total) by mouth 3 (three) times daily as needed for muscle spasms. 30 each 3   benzonatate (TESSALON) 200 MG capsule Take 1 capsule (200 mg total) by mouth 3 (three) times daily as needed for cough. 30 capsule 3   Butenafine HCl (MENTAX) 1 % cream Apply 1 application topically 2 (two) times daily. 24 g 1   Cholecalciferol (VITAMIN D3  PO) Take 2 capsules by mouth daily.     Docusate Sodium (COLACE PO) Take 1 capsule by mouth daily.     EPINEPHrine 0.3 mg/0.3 mL IJ SOAJ injection Inject 0.3 mg into the muscle as needed for anaphylaxis.     fluticasone (FLONASE) 50 MCG/ACT nasal spray Place 2 sprays into both nostrils daily. 16 g 3    HYDROcodone-acetaminophen (NORCO/VICODIN) 5-325 MG tablet Take 1 tablet by mouth every 4 (four) hours as needed (pain). 30 tablet 0   levonorgestrel (MIRENA) 20 MCG/24HR IUD 1 each by Intrauterine route once.      Menthol, Topical Analgesic, (BIOFREEZE) 10 % CREA Apply 1 Application topically daily as needed (pain).     ondansetron (ZOFRAN-ODT) 8 MG disintegrating tablet Take 1 tablet (8 mg total) by mouth every 8 (eight) hours as needed for nausea or vomiting. 20 tablet 1   Pancrelipase, Lip-Prot-Amyl, (CREON) 24000-76000 units CPEP Take 2 capsules by mouth See admin instructions. Take 2 capsules three times daily with food and 1 capsule twice daily with snacks.     pantoprazole (PROTONIX) 20 MG tablet Take 20 mg by mouth daily.     psyllium (REGULOID) 0.52 g capsule Take 0.52 capsules by mouth daily.     Zinc Sulfate (ZINC 15 PO) Take 15 mg by mouth daily.     No current facility-administered medications on file prior to visit.   Allergies  Allergen Reactions   Bahia Hives, Shortness Of Breath and Swelling    Found through allergy testing, not sure which item caused the shortness of breath   [place] Grass Hives, Shortness Of Breath and Swelling    Found through allergy testing, not sure which item caused the shortness of breath   SYSCO, Shortness Of Breath and Swelling    Found through allergy testing, not sure which item caused the shortness of breath   Cladosporium Cladosporioides Hives, Shortness Of Breath and Swelling    Found through allergy testing, not sure which item caused the shortness of breath   Dust Mite Extract Hives, Shortness Of Breath and Swelling    Found through allergy testing, not sure which item caused the shortness of breath   Elm Bark [Ulmus Fulva] Hives, Shortness Of Breath and Swelling    Found through allergy testing, not sure which item caused the shortness of breath   Molds & Smuts Hives, Shortness Of Breath and Swelling    Found through allergy testing,  not sure which item caused the shortness of breath   Mugwort Hives, Shortness Of Breath and Swelling    Found through allergy testing, not sure which item caused the shortness of breath   Nettle [Nettle (Urtica Dioica)] Hives, Shortness Of Breath and Swelling   Red Mulberry Allergy Skin Test Hives, Shortness Of Breath and Swelling    Found through allergy testing, not sure which item caused the shortness of breath   Sorrel-Dock Mix [Sheep Sorrel-Yellow Dock] Hives, Shortness Of Breath and Swelling    Found through allergy testing, not sure which item caused the shortness of breath   Timothy Grass Pollen Allergen Hives, Shortness Of Breath and Swelling    Found through allergy testing, not sure which item caused the shortness of breath   American Cockroach    Mixed Ragweed    Family History  Problem Relation Age of Onset   Hypertension Mother    Diabetes Mother    Colon cancer Mother    Arthritis Mother    Thyroid disease Mother  Asthma Mother    Osteoporosis Mother    Diabetes Father    Hypertension Father    Bell's palsy Father    Hypertension Sister    Heart murmur Sister    Lupus Sister    Hypertension Sister    Polycystic ovary syndrome Sister    Scoliosis Sister    Hypertension Brother    Heart failure Brother    Cancer Brother    Other Paternal Aunt        pituitary surgery    PE: BP 130/82 (BP Location: Left Arm, Patient Position: Sitting, Cuff Size: Normal)   Pulse 61   Ht '5\' 9"'$  (1.753 m)   Wt 279 lb 3.2 oz (126.6 kg)   SpO2 93%   BMI 41.23 kg/m  Wt Readings from Last 3 Encounters:  07/07/22 279 lb 3.2 oz (126.6 kg)  06/22/22 279 lb 3.2 oz (126.6 kg)  06/16/22 277 lb 6.4 oz (125.8 kg)   Constitutional: overweight, in NAD Eyes: PERRLA, EOMI, no exophthalmos ENT: moist mucous membranes, no thyromegaly, no cervical lymphadenopathy Cardiovascular: RRR, No MRG Respiratory: CTA B Musculoskeletal: no deformities Skin: moist, warm, no rashes Neurological:  no tremor with outstretched hands  ASSESSMENT: 1. Pituitary macroadenoma - s/p resection 01/09/2022  2. H/o Hyperprolactinemia  3.  Low TSH  4. DI  PLAN:  1.  Complex patient with relatively newly diagnosed pituitary macroadenoma, after the years of elevated prolactin -The tumor was very large, impinging on the optic chiasm.  This was suspected at the time of her visit with ophthalmology in 2023 due to visual field cuts. -We checked pituitary labs which returned grossly normal including prolactin, however, patient was on bromocriptine at that time.  We also checked the prolactin level with dilution to eliminate possible hook effect, and this returned to normal, also -I referred her to neurosurgery and she had transsphenoidal surgery on 01/04/2022.  The tumor was debulked but it was not possible to be completely resected as she had residual tumor in the cavernous sinus, not amenable to surgery. -The pathology returned as neuroendocrine tumor without further characterization.  We asked for further staining and  a SF1 marker was positive, pointing towards gonadotropin etiology.  This is good news, since gonadotroph tumors are usually inactive and less likely to spread. -We did stop her bromocriptine after the surgery.  Prolactin level increased afterwards but was still normal at last check. -After the surgery, she developed transient DI and she was on DDAVP.  At last visit, I advised her how to reduce the dose and she is now off desmopressin.  Latest sodium level was normal.  She denies increased thirst or urination.  She has -At last visit, we rechecked her pituitary hormones and they were all normal.  She did have a low TSH after the surgery, which normalized in 02/2022. -However, since last visit, she is on neuro-oncology and had stereotactic radiation treatment -A TSH level was slightly low after the treatments.  -At today's visit, we discussed that after radiation, hypopituitarism is possible  so we have to keep a close eye on her pituitary functions -At today's visit, she has fatigue, which improved after starting back on CPAP, and also hair loss.  No weight gain.  Postnasal drip is better. -We will go ahead and check her thyroid tests along with the rest of the pituitary hormones between 8 and 9 in the morning, fasting.  She finished radiation therapy approximately 3 weeks ago.  We discussed that there may be  some swelling in the pituitary area with may influence the results -I plan to see the patient back in 6 months, or sooner, depending on the results -She continues to also follow with Dr. Venetia Constable  2.  Hyperprolactinemia -Patient had a 10-year history of slightly elevated prolactin, previously treated with bromocriptine -After her dietary MRI obtained earlier this year showing a large pituitary tumor, the prolactin elevation was most likely related to pituitary stalk compression -We stopped bromocriptine after her transsphenoidal surgery -We will recheck her pituitary labs now including the prolactin  3.  Low TSH -She had a low TSH around the time of her surgery, which normalized at last visit, along with free T4 and free T3 -However, approximately 3 weeks ago, 2 days after her last radiation treatment, she had another slightly low TSH, while free T4 and free T3 levels were normal: Lab Results  Component Value Date   TSH 0.287 (L) 06/16/2022  -She is not on levothyroxine we will recheck her TFTs at next blood draw  4. DI -at last visit, she was on DDAVP 0.1 mg twice a day. -At that time, I advised her to try to reduce the dose and if, after, if possible. -Fortunately, she was able to stop this without significant discomfort -She does have a history of weak bladder wall for which she was seeing urology.  She has mild urinary incontinence, but manageable.  She missed her appointment with urology due to her recent health issues, but plans to reschedule this. -Previous sodium  levels were reviewed and these were normal, at 144  Orders Placed This Encounter  Procedures   TSH   T4, free   T3, free   Insulin-like growth factor   Growth hormone   ACTH   Prolactin   Cortisol    - Total time spent for the visit: 40 minutes, in precharting, in obtaining medical information from the chart, reviewing her  previous labs, imaging evaluations, and treatments, reviewing her symptoms, counseling her about her endocrine conditions (please see the discussed topics above), and developing a plan to further investigate and treat them; she had a number of questions which I addressed.    Component     Latest Ref Rng 07/10/2022  T4,Free(Direct)     0.60 - 1.60 ng/dL 0.72   TSH     0.35 - 5.50 uIU/mL 0.89   IGF-I, LC/MS     52 - 328 ng/mL 129   Z-Score (Female)     -2.0 - 2.0 SD -0.2   Prolactin     ng/mL 12.6   Cortisol, Plasma     ug/dL 9.1   Triiodothyronine,Free,Serum     2.3 - 4.2 pg/mL 3.5   C206 ACTH     6 - 50 pg/mL 31   Growth Hormone     < OR = 7.1 ng/mL 0.1   All labs normal.  Philemon Kingdom, MD PhD [facility] Endocrinology

## 2022-07-10 ENCOUNTER — Other Ambulatory Visit (INDEPENDENT_AMBULATORY_CARE_PROVIDER_SITE_OTHER): Payer: BC Managed Care – PPO

## 2022-07-10 ENCOUNTER — Encounter: Payer: Self-pay | Admitting: Internal Medicine

## 2022-07-10 DIAGNOSIS — D352 Benign neoplasm of pituitary gland: Secondary | ICD-10-CM | POA: Diagnosis not present

## 2022-07-10 LAB — T3, FREE: T3, Free: 3.5 pg/mL (ref 2.3–4.2)

## 2022-07-10 LAB — T4, FREE: Free T4: 0.72 ng/dL (ref 0.60–1.60)

## 2022-07-10 LAB — TSH: TSH: 0.89 u[IU]/mL (ref 0.35–5.50)

## 2022-07-10 LAB — CORTISOL: Cortisol, Plasma: 9.1 ug/dL

## 2022-07-15 LAB — PROLACTIN: Prolactin: 12.6 ng/mL

## 2022-07-15 LAB — INSULIN-LIKE GROWTH FACTOR
IGF-I, LC/MS: 129 ng/mL (ref 52–328)
Z-Score (Female): -0.2 SD (ref ?–2.0)

## 2022-07-15 LAB — GROWTH HORMONE: Growth Hormone: 0.1 ng/mL (ref <=7.1)

## 2022-07-15 LAB — ACTH: C206 ACTH: 31 pg/mL (ref 6–50)

## 2022-07-18 ENCOUNTER — Telehealth: Payer: Self-pay | Admitting: Radiation Therapy

## 2022-07-18 ENCOUNTER — Other Ambulatory Visit: Payer: Self-pay | Admitting: Radiation Therapy

## 2022-07-18 ENCOUNTER — Ambulatory Visit
Admission: RE | Admit: 2022-07-18 | Discharge: 2022-07-18 | Disposition: A | Discharge status: Home / Self Care | Payer: BC Managed Care – PPO | Source: Ambulatory Visit | Attending: Radiation Oncology | Admitting: Radiation Oncology

## 2022-07-18 ENCOUNTER — Other Ambulatory Visit: Payer: Self-pay

## 2022-07-18 VITALS — BP 122/69 | HR 69 | Temp 97.7°F | Resp 20 | Ht 69.0 in | Wt 279.8 lb

## 2022-07-18 DIAGNOSIS — D352 Benign neoplasm of pituitary gland: Secondary | ICD-10-CM

## 2022-07-18 NOTE — Progress Notes (Signed)
Radiation Oncology         [phone] ________________________________  Name: Beth [name] MRN: [id number]  Date: 07/18/2022  DOB: 24-May-1976  Follow-Up Visit Note  Outpatient  CC: Beth Perna, NP  Beth Perna, NP  Diagnosis:  Pituitary adenoma     ==========DELIVERED PLANS==========  First Treatment Date: 2022-06-05 - Last Treatment Date: 2022-06-14   Plan Name: Brain_SRT1 Site: Brain Technique: SBRT/SRT-IMRT Mode: Photon Dose Per Fraction: 5 Gy Prescribed Dose (Delivered / Prescribed): 25 Gy / 25 Gy Prescribed Fxs (Delivered / Prescribed): 5 / 5  CHIEF COMPLAINT: Here for follow-up and after completing SRS treatment for a pituitary mass  Interval Since Last Radiation:  1 months  Narrative:  The patient returns today for routine follow-up.         Recent neurologic symptoms, if any:  Seizures: None Headaches: Reports occasional headaches to her occipital region or a pressure above her eyes which can cause light sensitvity; Reports these generally improve with oral Tylenol and lying down Nausea: Reports nausea resolved around 07/08/22-07/09/22    Wt Readings from Last 3 Encounters:  07/18/22 279 lb 12.8 oz (126.9 kg)  07/07/22 279 lb 3.2 oz (126.6 kg)  06/22/22 279 lb 3.2 oz (126.6 kg)    Dizziness/ataxia: Continues to feel dizzy when she changes position (from sitting to standing). Reports she's diligent about standing still and getting acclimated, so she denies any falls Difficulty with hand coordination: Denies any changes Focal numbness/weakness: Reports continued weakness to left side of her body. Ambulate with a cane for added support Visual deficits/changes: Reports occasional light sensitvity. Continues to deal with altered field of vision, especially with her left eye. Has to use 2 monitors at work.  Confusion/Memory deficits: Reports an increase in struggling to find particular words, and feels like her cognition is more sluggish ("I feel  like I'm slower that I was and second guess myself more. Not as articulate as I was before surgery and treatment. I have continued brain fog from [place]").    Other issues of note: Reports fatigue is still present, but has improved from every today to every other day. Continues to deal with bilateral sciatica and stiffness with movements. Reports bouts of random/intense pain to the bottoms of her feet that occur when she stands to walk (states she never knows when it's going to occur so she can never prepare herself and is always caught off guard)  ALLERGIES:  is allergic to [place], [place] grass, cedar, cladosporium cladosporioides, dust mite extract, elm bark [ulmus fulva], molds & smuts, mugwort, nettle [nettle (urtica dioica)], red mulberry allergy skin test, sorrel-dock mix [sheep sorrel-yellow dock], timothy grass pollen allergen, american cockroach, and mixed ragweed.  Meds: Current Outpatient Medications  Medication Sig Dispense Refill   albuterol (VENTOLIN HFA) 108 (90 Base) MCG/ACT inhaler Inhale 2 puffs into the lungs every 6 (six) hours as needed for wheezing or shortness of breath. 8 g 6   Ascorbic Acid (VITAMIN C) 1000 MG tablet Take 1,000 mg by mouth daily.     baclofen (LIORESAL) 10 MG tablet Take 0.5-1 tablets (5-10 mg total) by mouth 3 (three) times daily as needed for muscle spasms. 30 each 3   benzonatate (TESSALON) 200 MG capsule Take 1 capsule (200 mg total) by mouth 3 (three) times daily as needed for cough. 30 capsule 3   Butenafine HCl (MENTAX) 1 % cream Apply 1 application topically 2 (two) times daily. 24 g 1   Cholecalciferol (VITAMIN D3 PO) Take  2 capsules by mouth daily.     Docusate Sodium (COLACE PO) Take 1 capsule by mouth daily.     EPINEPHrine 0.3 mg/0.3 mL IJ SOAJ injection Inject 0.3 mg into the muscle as needed for anaphylaxis.     fluticasone (FLONASE) 50 MCG/ACT nasal spray Place 2 sprays into both nostrils daily. 16 g 3   HYDROcodone-acetaminophen  (NORCO/VICODIN) 5-325 MG tablet Take 1 tablet by mouth every 4 (four) hours as needed (pain). 30 tablet 0   levonorgestrel (MIRENA) 20 MCG/24HR IUD 1 each by Intrauterine route once.      Menthol, Topical Analgesic, (BIOFREEZE) 10 % CREA Apply 1 Application topically daily as needed (pain).     ondansetron (ZOFRAN-ODT) 8 MG disintegrating tablet Take 1 tablet (8 mg total) by mouth every 8 (eight) hours as needed for nausea or vomiting. 20 tablet 1   Pancrelipase, Lip-Prot-Amyl, (CREON) 24000-76000 units CPEP Take 2 capsules by mouth See admin instructions. Take 2 capsules three times daily with food and 1 capsule twice daily with snacks.     pantoprazole (PROTONIX) 20 MG tablet Take 20 mg by mouth daily.     psyllium (REGULOID) 0.52 g capsule Take 0.52 capsules by mouth daily.     Zinc Sulfate (ZINC 15 PO) Take 15 mg by mouth daily.     No current facility-administered medications for this encounter.    Physical Findings: The patient is in no acute distress. Patient is alert and oriented.  height is '5\' 9"'$  (1.753 m) and weight is 279 lb 12.8 oz (126.9 kg). Her temperature is 97.7 F (36.5 C). Her blood pressure is 122/69 and her pulse is 69. Her respiration is 20 and oxygen saturation is 100%. .  No significant changes. Neurological: EOMs intact. CN II-VII are intact. Patient reports double vision but is still able to determine how many fingers I am holding up. Visual quadrants are intact. Fine muscle coordination also intact.  Muskuloskeletal: slight weakness in hip flexion on the left.   Lab Findings: Lab Results  Component Value Date   WBC 5.2 06/16/2022   HGB 12.8 06/16/2022   HCT 39.7 06/16/2022   MCV 83 06/16/2022   PLT 311 06/16/2022    Radiographic Findings: I personally reviewed the radiographic findings.   Impression/Plan:  Pituitary Adenoma  Patient is recovering well from radiation therapy and is doing well from a radiation standpoint. We are very pleased to see that her  nausea has subsided. Dr. Isidore Moos recommends repeat brain MRI in June and continued follow up with Dr. Mickeal Skinner.   Patient is scheduled to meet with her endocrinologist in July. She will continue routine follow up with her endocrinologist every 6 months.  She will see rad/onc PRN. She is pleased with this plan.  On date of service, in total, I spent 20 minutes on this encounter. Patient was seen in person.  _____________________________________   Leona Singleton, PA    Eppie Gibson, MD

## 2022-07-18 NOTE — Telephone Encounter (Signed)
Spoke with pt about her June brain MRI and follow-up with Dr. Mickeal Skinner. She has the info available in MyChart and said she will call back if there are any conflicts or concerns before that visit.   Mont Dutton R.T.(R)(T) Radiation Special Procedures Navigator

## 2022-07-21 ENCOUNTER — Ambulatory Visit: Payer: BC Managed Care – PPO | Admitting: Endocrinology

## 2022-07-21 DIAGNOSIS — H9012 Conductive hearing loss, unilateral, left ear, with unrestricted hearing on the contralateral side: Secondary | ICD-10-CM | POA: Diagnosis not present

## 2022-09-01 ENCOUNTER — Ambulatory Visit (INDEPENDENT_AMBULATORY_CARE_PROVIDER_SITE_OTHER): Payer: BC Managed Care – PPO | Admitting: Primary Care

## 2022-09-01 DIAGNOSIS — H5340 Unspecified visual field defects: Secondary | ICD-10-CM | POA: Diagnosis not present

## 2022-09-01 DIAGNOSIS — D352 Benign neoplasm of pituitary gland: Secondary | ICD-10-CM | POA: Diagnosis not present

## 2022-09-01 DIAGNOSIS — E559 Vitamin D deficiency, unspecified: Secondary | ICD-10-CM | POA: Diagnosis not present

## 2022-09-01 DIAGNOSIS — E785 Hyperlipidemia, unspecified: Secondary | ICD-10-CM

## 2022-09-02 LAB — VITAMIN D 25 HYDROXY (VIT D DEFICIENCY, FRACTURES): Vit D, 25-Hydroxy: 28.1 ng/mL — ABNORMAL LOW (ref 30.0–100.0)

## 2022-09-06 NOTE — Progress Notes (Signed)
Labs only

## 2022-09-08 DIAGNOSIS — Z30431 Encounter for routine checking of intrauterine contraceptive device: Secondary | ICD-10-CM | POA: Diagnosis not present

## 2022-09-08 DIAGNOSIS — Z01419 Encounter for gynecological examination (general) (routine) without abnormal findings: Secondary | ICD-10-CM | POA: Diagnosis not present

## 2022-09-28 DIAGNOSIS — Z1231 Encounter for screening mammogram for malignant neoplasm of breast: Secondary | ICD-10-CM | POA: Diagnosis not present

## 2022-12-02 ENCOUNTER — Encounter (HOSPITAL_BASED_OUTPATIENT_CLINIC_OR_DEPARTMENT_OTHER): Payer: Self-pay | Admitting: Emergency Medicine

## 2022-12-02 ENCOUNTER — Emergency Department (HOSPITAL_BASED_OUTPATIENT_CLINIC_OR_DEPARTMENT_OTHER)
Admission: EM | Admit: 2022-12-02 | Discharge: 2022-12-03 | Disposition: A | Discharge status: Home / Self Care | Payer: BC Managed Care – PPO | Attending: Emergency Medicine | Admitting: Emergency Medicine

## 2022-12-02 DIAGNOSIS — Z23 Encounter for immunization: Secondary | ICD-10-CM | POA: Diagnosis not present

## 2022-12-02 DIAGNOSIS — I10 Essential (primary) hypertension: Secondary | ICD-10-CM | POA: Diagnosis not present

## 2022-12-02 DIAGNOSIS — W260XXA Contact with knife, initial encounter: Secondary | ICD-10-CM | POA: Diagnosis not present

## 2022-12-02 DIAGNOSIS — S61012A Laceration without foreign body of left thumb without damage to nail, initial encounter: Secondary | ICD-10-CM | POA: Insufficient documentation

## 2022-12-02 DIAGNOSIS — J45909 Unspecified asthma, uncomplicated: Secondary | ICD-10-CM | POA: Diagnosis not present

## 2022-12-02 MED ORDER — LIDOCAINE HCL 2 % IJ SOLN
10.0000 mL | Freq: Once | INTRAMUSCULAR | Status: DC
  Filled 2022-12-02: qty 20

## 2022-12-02 NOTE — ED Triage Notes (Signed)
She cut her L thumb with a pocket knife while trying to cut a zip tie. Bleeding controlled.

## 2022-12-03 ENCOUNTER — Emergency Department (HOSPITAL_BASED_OUTPATIENT_CLINIC_OR_DEPARTMENT_OTHER): Payer: BC Managed Care – PPO

## 2022-12-03 DIAGNOSIS — S61032A Puncture wound without foreign body of left thumb without damage to nail, initial encounter: Secondary | ICD-10-CM | POA: Diagnosis not present

## 2022-12-03 MED ORDER — ACETAMINOPHEN 500 MG PO TABS
1000.0000 mg | ORAL_TABLET | Freq: Once | ORAL | Status: AC
  Administered 2022-12-03: 1000 mg via ORAL
  Filled 2022-12-03: qty 2

## 2022-12-03 MED ORDER — TETANUS-DIPHTH-ACELL PERTUSSIS 5-2.5-18.5 LF-MCG/0.5 IM SUSY
0.5000 mL | PREFILLED_SYRINGE | Freq: Once | INTRAMUSCULAR | Status: DC
  Filled 2022-12-03: qty 0.5

## 2022-12-03 MED ORDER — CEPHALEXIN 500 MG PO CAPS: 500.0000 mg | ORAL_CAPSULE | Freq: Three times a day (TID) | ORAL | 0 refills | Status: AC

## 2022-12-03 NOTE — Discharge Instructions (Signed)
Do not let your laceration (cut) get wet for the next 48 hours. After that you may allow soapy water to drain down the wound to clean it. Please do not scrub.  To minimize scarring, you can apply a vaseline based ointment for the next 2 weeks and keep it out of direct sun light. After that, you may apply sunscreen for the next several months. Your strip will come off on its own. Do not pull it off unless it is more than half way off. Return if your wound appears to be infected (see laceration care instructions).

## 2022-12-03 NOTE — ED Notes (Signed)
Discharge instructions reviewed with patient. Patient questions answered and opportunity for education reviewed. Patient voices understanding of discharge instructions with no further questions. Patient ambulatory with steady gait to lobby.  

## 2022-12-03 NOTE — ED Provider Notes (Signed)
[place] EMER[facility]ENCY DEPARTMENT AT [facility] Provider Note  CSN: [id number] Arrival date & time: 12/02/22 2122  Chief Complaint(s) Finger Injury  HPI Beth [name] is a 47 y.o. female who presents to the emergency department with left thumb injury.  Patient reports stabbing herself with a pocket knife while trying to cut a zip tie.  Bleeding controlled with pressure.  Patient is unsure of her tetanus status.  She is endorsing mild discomfort around the stab wound.  Still able to fully range.  Sensations intact.  The history is provided by the patient.    Past Medical History Past Medical History:  Diagnosis Date   Asthma    Chronic pancreatitis (HCC)    DDD (degenerative disc disease), lumbosacral    DJD (degenerative joint disease)    Headache    Hyperlipidemia    Hypertension    Hypoglycemia    Obesity    Osteoarthritis    Prolactinoma (HCC)    Sciatica    Sleep apnea    Patient Active Problem List   Diagnosis Date Noted   Hypoglycemia 06/16/2022   Status post transsphenoidal pituitary resection (HCC) 01/04/2022   Low TSH level 10/21/2021   Dyslipidemia 07/15/2021   Osteoarthritis 08/20/2020   Sciatica 07/30/2020   DJD (degenerative joint disease)    Obstructive sleep apnea 03/11/2020   Snoring 02/05/2020   Pneumonia due to COVID-19 virus 12/15/2019   Acute respiratory failure with hypoxia (HCC) 12/15/2019   Hypokalemia 12/15/2019   Asthma without acute exacerbation 12/15/2019   Encounter for management of intrauterine contraceptive device (IUD) 06/09/2019   Essential hypertension 06/09/2019   Fatigue 06/09/2019   Slow transit constipation 06/09/2019   Hyperprolactinemia (HCC) 02/15/2018   Migraine 10/12/2017   Chronic pancreatitis (HCC) 11/09/2016   Prolactinoma (HCC) 11/09/2016   BMI 38.0-38.9,adult 11/09/2016   Candida rash of groin 11/09/2016   Mild intermittent asthma 11/09/2016   Mixed hyperlipidemia 11/09/2016   Home Medication(s) Prior  to Admission medications   Medication Sig Start Date End Date Taking? Authorizing Provider  albuterol (VENTOLIN HFA) 108 (90 Base) MCG/ACT inhaler Inhale 2 puffs into the lungs every 6 (six) hours as needed for wheezing or shortness of breath. 06/13/22   Leslye Peer, MD  Ascorbic Acid (VITAMIN C) 1000 MG tablet Take 1,000 mg by mouth daily.    [provider]  baclofen (LIORESAL) 10 MG tablet Take 0.5-1 tablets (5-10 mg total) by mouth 3 (three) times daily as needed for muscle spasms. 03/03/20   Hilts, Casimiro Needle, MD  benzonatate (TESSALON) 200 MG capsule Take 1 capsule (200 mg total) by mouth 3 (three) times daily as needed for cough. 06/13/22   Leslye Peer, MD  Butenafine HCl (MENTAX) 1 % cream Apply 1 application topically 2 (two) times daily. 06/11/20   Grayce Sessions, NP  Cholecalciferol (VITAMIN D3 PO) Take 2 capsules by mouth daily.    [provider]  Docusate Sodium (COLACE PO) Take 1 capsule by mouth daily.    [provider]  EPINEPHrine 0.3 mg/0.3 mL IJ SOAJ injection Inject 0.3 mg into the muscle as needed for anaphylaxis.    [provider]  fluticasone (FLONASE) 50 MCG/ACT nasal spray Place 2 sprays into both nostrils daily. 06/22/21   Leslye Peer, MD  HYDROcodone-acetaminophen (NORCO/VICODIN) 5-325 MG tablet Take 1 tablet by mouth every 4 (four) hours as needed (pain). 01/13/22   Jadene Pierini, MD  levonorgestrel (MIRENA) 20 MCG/24HR IUD 1 each by Intrauterine route once.  [provider]  Menthol, Topical Analgesic, (BIOFREEZE) 10 % CREA Apply 1 Application topically daily as needed (pain).    [provider]  ondansetron (ZOFRAN-ODT) 8 MG disintegrating tablet Take 1 tablet (8 mg total) by mouth every 8 (eight) hours as needed for nausea or vomiting. 06/09/22   Lonie Peak, MD  Pancrelipase, Lip-Prot-Amyl, (CREON) 24000-76000 units CPEP Take 2 capsules by mouth See admin instructions. Take 2 capsules three  times daily with food and 1 capsule twice daily with snacks. 04/20/17   [provider]  pantoprazole (PROTONIX) 20 MG tablet Take 20 mg by mouth daily. 11/25/19   [provider]  psyllium (REGULOID) 0.52 g capsule Take 0.52 capsules by mouth daily.    [provider]  Zinc Sulfate (ZINC 15 PO) Take 15 mg by mouth daily.    [provider]                                                                                                                                    Allergies [place], [place] grass, Cedar, Cladosporium cladosporioides, Dust mite extract, Elm bark [ulmus fulva], Molds & smuts, Mugwort, Nettle [nettle (urtica dioica)], Red mulberry allergy skin test, Sorrel-dock mix [sheep sorrel-yellow dock], Timothy grass pollen allergen, American cockroach, and Mixed ragweed  Review of Systems Review of Systems As noted in HPI  Physical Exam Vital Signs  I have reviewed the triage vital signs BP 138/83   Pulse 77   Temp 98.4 F (36.9 C) (Oral)   Resp 20   Ht 5\' 9"  (1.753 m)   Wt 124.3 kg   SpO2 99%   BMI 40.46 kg/m   Physical Exam Vitals reviewed.  Constitutional:      General: She is not in acute distress.    Appearance: She is well-developed. She is not diaphoretic.  HENT:     Head: Normocephalic and atraumatic.     Right Ear: External ear normal.     Left Ear: External ear normal.     Nose: Nose normal.  Eyes:     General: No scleral icterus.    Conjunctiva/sclera: Conjunctivae normal.  Neck:     Trachea: Phonation normal.  Cardiovascular:     Rate and Rhythm: Normal rate and regular rhythm.  Pulmonary:     Effort: Pulmonary effort is normal. No respiratory distress.     Breath sounds: No stridor.  Abdominal:     General: There is no distension.  Musculoskeletal:        General: Normal range of motion.     Left hand: Laceration present. Normal strength. Normal sensation.       Hands:     Cervical back: Normal range of motion.   Neurological:     Mental Status: She is alert and oriented to person, place, and time.  Psychiatric:        Behavior: Behavior normal.     ED Results  and Treatments Labs (all labs ordered are listed, but only abnormal results are displayed) Labs Reviewed - No data to display                                                                                                                       EKG  EKG Interpretation  Date/Time:    Ventricular Rate:    PR Interval:    QRS Duration:   QT Interval:    QTC Calculation:   R Axis:     Text Interpretation:         Radiology DG Finger Thumb Left  Result Date: 12/03/2022 CLINICAL DATA:  Stab wound from pocket knife EXAM: LEFT THUMB 2+V COMPARISON:  None Available. FINDINGS: There is no evidence of fracture or dislocation. There is no evidence of arthropathy or other focal bone abnormality. Mild soft tissue swelling. IMPRESSION: No acute fracture. Electronically Signed   By: Minerva Fester M.D.   On: 12/03/2022 01:26    Medications Ordered in ED Medications  lidocaine (XYLOCAINE) 2 % (with pres) injection 200 mg (has no administration in time range)  Tdap (BOOSTRIX) injection 0.5 mL (0.5 mLs Intramuscular Patient Refused/Not Given 12/03/22 0034)  acetaminophen (TYLENOL) tablet 1,000 mg (1,000 mg Oral Given 12/03/22 0030)   Procedures .Marland KitchenLaceration Repair  Date/Time: 12/03/2022 12:48 AM  Performed by: Nira Conn, MD Authorized by: Nira Conn, MD   Consent:    Consent obtained:  Verbal   Consent given by:  Patient   Risks discussed:  Infection, pain, poor cosmetic result, need for additional repair and poor wound healing   Alternatives discussed:  Delayed treatment Universal protocol:    Imaging studies available: yes     Patient identity confirmed:  Verbally with patient Laceration details:    Location:  Finger   Finger location:  L thumb   Length (cm):  1.3   Depth (mm):  5 Pre-procedure details:     Preparation:  Imaging obtained to evaluate for foreign bodies Exploration:    Hemostasis achieved with:  Direct pressure   Imaging obtained: x-ray     Imaging outcome: foreign body not noted   Treatment:    Area cleansed with:  Saline   Amount of cleaning:  Standard   Irrigation solution:  Sterile saline   Irrigation method:  Pressure wash Skin repair:    Repair method:  Steri-Strips   Number of Steri-Strips:  1 Approximation:    Approximation:  Close Repair type:    Repair type:  Simple Post-procedure details:    Dressing:  Non-adherent dressing   Procedure completion:  Tolerated   (including critical care time) Medical Decision Making / ED Course   Medical Decision Making Amount and/or Complexity of Data Reviewed Radiology: ordered and independent interpretation performed. Decision-making details documented in ED Course.  Risk OTC drugs. Prescription drug management.    Stab wound to the left thumb Hemostatic No obvious foreign bodies noted on exam X-ray negative for any obvious bony  involvement.  Wound is distal to the joint, thus I have a low suspicion for joint involvement. Tetanus updated Wound irrigated and closed as above.    Final Clinical Impression(s) / ED Diagnoses Final diagnoses:  None    This chart was dictated using voice recognition software.  Despite best efforts to proofread,  errors can occur which can change the documentation meaning.    Nira Conn, MD 12/03/22 [phone]

## 2022-12-14 ENCOUNTER — Ambulatory Visit
Admission: RE | Admit: 2022-12-14 | Discharge: 2022-12-14 | Disposition: A | Discharge status: Home / Self Care | Payer: BC Managed Care – PPO | Source: Ambulatory Visit | Attending: Radiation Oncology | Admitting: Radiation Oncology

## 2022-12-14 DIAGNOSIS — D352 Benign neoplasm of pituitary gland: Secondary | ICD-10-CM

## 2022-12-14 MED ORDER — GADOPICLENOL 0.5 MMOL/ML IV SOLN
10.0000 mL | Freq: Once | INTRAVENOUS | Status: AC | PRN
  Administered 2022-12-14: 10 mL via INTRAVENOUS

## 2022-12-18 ENCOUNTER — Inpatient Hospital Stay: Payer: BC Managed Care – PPO

## 2022-12-21 ENCOUNTER — Inpatient Hospital Stay: Payer: BC Managed Care – PPO | Attending: Internal Medicine | Admitting: Internal Medicine

## 2022-12-21 ENCOUNTER — Other Ambulatory Visit: Payer: Self-pay

## 2022-12-21 VITALS — BP 135/82 | HR 61 | Temp 97.7°F | Resp 17 | Wt 274.6 lb

## 2022-12-21 DIAGNOSIS — E669 Obesity, unspecified: Secondary | ICD-10-CM | POA: Insufficient documentation

## 2022-12-21 DIAGNOSIS — D352 Benign neoplasm of pituitary gland: Secondary | ICD-10-CM | POA: Diagnosis not present

## 2022-12-21 DIAGNOSIS — Z79899 Other long term (current) drug therapy: Secondary | ICD-10-CM | POA: Diagnosis not present

## 2022-12-21 DIAGNOSIS — E785 Hyperlipidemia, unspecified: Secondary | ICD-10-CM | POA: Insufficient documentation

## 2022-12-21 DIAGNOSIS — Z800 Family history of malignant neoplasm of digestive organs: Secondary | ICD-10-CM | POA: Insufficient documentation

## 2022-12-21 DIAGNOSIS — G4730 Sleep apnea, unspecified: Secondary | ICD-10-CM | POA: Insufficient documentation

## 2022-12-21 DIAGNOSIS — K861 Other chronic pancreatitis: Secondary | ICD-10-CM | POA: Diagnosis not present

## 2022-12-21 DIAGNOSIS — I10 Essential (primary) hypertension: Secondary | ICD-10-CM | POA: Diagnosis not present

## 2022-12-21 MED ORDER — LORAZEPAM 1 MG PO TABS: 1.0000 mg | ORAL_TABLET | Freq: Three times a day (TID) | ORAL | 0 refills | Status: AC | PRN
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
## 2022-12-25 ENCOUNTER — Ambulatory Visit: Payer: BC Managed Care – PPO | Admitting: Internal Medicine

## 2022-12-25 ENCOUNTER — Encounter: Payer: Self-pay | Admitting: Internal Medicine

## 2022-12-25 VITALS — BP 120/70 | HR 79 | Ht 69.0 in | Wt 272.2 lb

## 2022-12-25 DIAGNOSIS — D352 Benign neoplasm of pituitary gland: Secondary | ICD-10-CM | POA: Diagnosis not present

## 2022-12-25 DIAGNOSIS — E221 Hyperprolactinemia: Secondary | ICD-10-CM | POA: Diagnosis not present

## 2022-12-25 DIAGNOSIS — R7989 Other specified abnormal findings of blood chemistry: Secondary | ICD-10-CM

## 2022-12-25 NOTE — Patient Instructions (Addendum)
Please come back for labs fasting, between 8-9 am.  You should have an endocrinology follow-up appointment in 1 year.

## 2022-12-25 NOTE — Progress Notes (Addendum)
Patient ID: Beth [name], female   DOB: Jul 25, 1975, 47 y.o.   MRN: [id number]  HPI  Beth [name] is a 47 y.o.-year-old female, presenting for follow-up for pituitary macroadenoma, now s/p transsphenoidal surgery.  She previously saw Dr. Everardo [name], but last visit with me, 4 months ago.  Interim history: She has some postnasal drip and cough, which improved, but persistent.  She sees Dr. Delton [name].   She still has headaches. She has some urinary incontinence, which is chronic.  She sees urology. Previously had fatigue which improved after starting back on her CPAP. CPAP stopped working 2 mo ago.  She has days in which she feels very tired. Hair loss improved. Hair is a little thicker. Before last visit, she had stereotactic radiation therapy to her pituitary tumor.  She developed facial swelling, coughing, nausea and vomiting during RxTx but was able to complete the 5 treatments. She has some memory pbs after the RxTx. She had a VF 09/01/2022 - stable. Dr. Lorin [name].   Reviewed and addended history:  Pt. was found to have a high prolactin level in 2007 when she presented with galactorrhea. She was started on Metformin. 1 Month later, she got pregnant.  She stopped Metformin when pregnancy was detected.  In 2013, she saw another ObGyn for current galactorrhea.  Pregnancy was ruled out. PRL was high. She was started on Bromocriptine.  She moved to [place] in 2015. She continued Bromocriptine.  She was referred to Dr. Everardo [name] in 01/2018.  She was still on bromocriptine when she started to see him and she was maintained on a low-dose by Dr. Everardo [name].  In 06/2021, PRL again returned high >> Bromocriptine was increased from 2.5 to 5 mg daily but she could not tolerate it >> back to 2.5 mg.  She then saw ophthalmology >> Bilateral VF cut >> rec'd an MRI >> sent for a pituitary MRI (10/2021).   This showed a very large pituitary tumor, consistent with a macroadenoma with significant mass effect on the optic  chiasm: Pituitary MRI (11/12/2021): Brain: Very large heterogeneously enhancing sellar/suprasellar mass, which involves the pituitary gland and measures up to 4.1 x 3.4 x 4.1 cm (AP by transverse by craniocaudal). Significant suprasellar extension with significant mass effect on the optic chiasm. Tumor extends into the sphenoid sinuses bilaterally. Definite right and probable left cavernous sinus invasion with apparent encasement of the right greater than left internal carotid arteries.   No evidence of acute infarct, acute hemorrhage, hydrocephalus, midline shift, or extra-axial fluid collection. No pathologic enhancement outside of the pituitary/sella.   Vascular: Major arterial flow voids are maintained at the skull base.   Skull and upper cervical spine: Normal marrow signal.   Sinuses/Orbits: Tumor within the sphenoid sinuses, described above.  Otherwise, sinuses are largely clear.   Other: No mastoid effusions.ac   IMPRESSION: Very large 4.1 x 3.4 by 4.1 cm sellar/suprasellar mass with significant mass effect on the optic chiasm, cavernous sinus invasion, and extension into the sphenoid sinus, probably a pituitary macroadenoma. Recommend neurosurgical consultation.      The MRI results return to me after Dr. Everardo [name] left the practice.  We checked her pituitary hormones and these were normal.    I referred her to neurosurgery and she had transsphenoidal surgery 01/04/2022 by Dr. Johnsie [name] >> debulking, with residual pituitary tumor in the cavernous sinus  Pathology showed: Pituitary tumor pathology was reviewed but this was reported only as neuroendocrine tumor, but we asked for further staining, which actually showed a gonadotropin origin:  A. PITUITARY TUMOR, RESECTION: Pituitary neuroendocrine tumor (adenoma of anterior pituitary gland), fragmented. There is no tumor necrosis. Adjacent strips of normal bone and nasal mucosa. There are no cytologic features diagnostic  of malignancy.  GROSS DESCRIPTION: Received fresh are portions of soft tan-pink to hemorrhagic tissue measuring 2.5 x 2.5 x 0.8 cm in aggregate.  There is a 1 cm portion of bone present.  The specimen is entirely submitted in 4 cassettes including decalcified bone.  (GRP 01/04/2022)  The case was sent to [facility] for performing additional immunostains. The immunostains show that the tumor cells are positive for Steroidogenic factor 1 (SF-1) and are negative for ACTH, prolactin, FSH, LH, GH and TSH.  Pituitary MRI obtained after surgery (02/20/2022): Showed large amount of persistent tissue in the right cavernous sinus:  Resolved suprasellar mass effect including on the optic chiasm (series 3, image 14). Intraorbital soft tissues remain stable and within normal limits. Postoperative changes to the posterior ethmoid and sphenoid sinuses, see additional details below. Mastoids remain clear. Visible internal auditory structures appear normal.   Other: Interval intra-sellar and suprasellar resection of tumor. Resolve suprasellar mass effect. Hypothalamus is within normal limits. Infundibulum is now visible, with some downward traction (series 12, image 8). Infundibulum is also deviated into the left sella turcica (series 14, image 8). Subjacent fairly homogeneously enhancing soft tissue there is likely normal pituitary. But this is inseparable from heterogeneously enhancing residual tumor which tracks posteriorly and laterally from the sella turcica into and across the right cavernous sinus as before. Confluent residual tumor is estimated at 26 (series 12, image 4) x 30 x 17 mm (images 6 and 7 of series 14), AP by transverse by CC. Right IAC siphon is engulfed as left-side/intra sellar tumor margin with the residual pituitary is difficult to discern on some images. But the left cavernous sinus appears to remain spared.   Superimposed abnormal signal and enhancement in the  postoperative sphenoid sinuses. In the midline and to the right of midline most of this is intrinsically T1 hyperintense (series 9, image 10) compatible with postoperative fat and/or inspissated secretions. Rim enhancing, but centrally T1 and T2 hypointense abnormality throughout the central and left sphenoid sinus appears fairly distinct from the residual tumor in the cavernous sinus and is probably paranasal sinus mucosal disease and/or granulation tissue with inspissated secretions. ...   IMPRESSION: 1. Interval debulking of intra-sellar and suprasellar pituitary adenoma. Resolved suprasellar mass effect. Residual tumor with epicenter at the right cavernous sinus is approximately 2.6 x 3.0 x 1.7 cm. See series 14, images 6 and 7. Left cavernous sinus seems spared. Abnormal mixed signal and enhancing material throughout both sphenoid sinuses is favored to be postoperative and/or sinus mucosal disease related. Attention is directed on follow-up.    After surgery, her TSH was low, but she was not started on levothyroxine pending further investigation. Also, she developed increased thirst and urination was started on DDAVP 0.1 mg twice a day. The rest of the hormones were not checked at that time. After the surgery, we got in contact and I advised her to stop bromocriptine.  I saw the patient for the first time in 02/2022 and repeated her pituitary hormones and they were again normal.  Pituitary MRI (05/25/2022): Brain: Postsurgical changes reflecting prior trans-sphenoidal pituitary mass resection are again seen. Residual adenoma centered in the right cavernous sinus measures approximately 2.2 cm AP x 2.7 cm TV in the axial plane (19-52) and 2.9 cm TV x 1.4 cm cc in  the coronal plane (18-9). The size of residual tumor is not significantly changed compared to the prior study allowing for slight differences in measurement technique and slice selection. There is unchanged cavernous  sinus invasion and encasement of the cavernous ICA. There is no extent into the suprasellar cistern or mass effect on the chiasm. There is unchanged leftward deviation of the pituitary stalk.   Previously seen enhancement in the adjacent sphenoid sinuses has decreased, favored to reflect evolving postoperative change as opposed to residual tumor. ...   IMPRESSION: 1. Since 02/18/2022, no significant interval change in size or appearance of the residual pituitary adenoma centered in the right cavernous sinus. 2. Decreased mucosal thickening and enhancement in the sphenoid sinuses, consistent with evolving postoperative change.   In 05/2022, she had stereotactic radiation therapy - 5 sessions - every other day. Last session 06/14/2022.   Pituitary MRI (12/14/2022): Brain: Patient is status post transsphenoidal resection of a pituitary macro adenoma. The pituitary stalk is asymmetric to the left, stable. Residual right-sided tumor extending into the right cavernous sinus continues to decrease in size, now measuring 20 x 11 mm on coronal images. This compares with 21 x 14 mm on the prior study. Cavernous sinus invasion is stable to decreased since prior exam. No new or progressive enhancement is present.  I reviewed pt's pertinent tests: Lab Results  Component Value Date   PROLACTIN 12.6 07/10/2022   PROLACTIN 15.1 03/14/2022   PROLACTIN 1.8 (L) 10/21/2021   PROLACTIN 40.0 (H) 07/15/2021   PROLACTIN 32.1 (H) 06/11/2020   PROLACTIN 5.4 05/15/2019   PROLACTIN 32.9 (H) 04/03/2019   PROLACTIN 5.5 03/22/2018   PROLACTIN 34.9 (H) 02/15/2018   Component Ref Range & Units 3 mo ago  PROLACTIN,UNDILUTED 2.0 - 30.0 ng/mL 2.8   PROLACTIN,DILUTED ng/mL   Comment: Result confirmed by 1:100 dilution. No high dose  hook effect detected.              Reference Range   Females          Non-pregnant        3.0-30.0          Pregnant           10.0-209.0          Postmenopausal      2.0-20.0      Lab Results  Component Value Date   TSH 0.89 07/10/2022   TSH 0.287 (L) 06/16/2022   TSH 0.71 03/14/2022   TSH 0.026 (L) 01/09/2022   TSH 0.73 11/15/2021   TSH 0.71 10/21/2021   TSH 1.030 06/13/2021   TSH 0.84 06/11/2020   TSH 0.440 (L) 02/19/2020   TSH 1.24 04/03/2019   FREET4 0.72 07/10/2022   FREET4 0.91 06/16/2022   FREET4 0.65 03/14/2022   FREET4 1.00 01/09/2022   FREET4 0.70 11/15/2021   FREET4 1.0 10/21/2021   FREET4 0.99 06/13/2021   FREET4 0.72 06/11/2020   FREET4 1.27 02/24/2020   FREET4 0.78 02/15/2018    Lab Results  Component Value Date   T3FREE 3.5 07/10/2022   T3FREE 2.4 06/16/2022   T3FREE 2.8 03/14/2022   T3FREE 2.6 11/15/2021   Component     Latest Ref Rng 07/10/2022  T4,Free(Direct)     0.60 - 1.60 ng/dL 1.61   TSH     0.96 - 0.45 uIU/mL 0.89   IGF-I, LC/MS     52 - 328 ng/mL 129   Z-Score (Female)     -2.0 - 2.0 SD -0.2  Prolactin     ng/mL 12.6   Cortisol, Plasma     ug/dL 9.1   Triiodothyronine,Free,Serum     2.3 - 4.2 pg/mL 3.5   C206 ACTH     6 - 50 pg/mL 31   Growth Hormone     < OR = 7.1 ng/mL 0.1    Component     Latest Ref Rng 03/14/2022  IGF-I, LC/MS     52 - 328 ng/mL 115   Z-Score (Female)     -2.0 - 2.0 SD -0.5   Sodium     135 - 145 mEq/L 141   Cortisol, Plasma     ug/dL 7.8   FSH     mIU/ML 6.9   LH     mIU/mL 3.51   C206 ACTH     6 - 50 pg/mL 23    Component     Latest Ref Rng 11/15/2021 01/09/2022  IGF-I, LC/MS     52 - 328 ng/mL 124    Z-Score (Female)     -2.0 - 2.0 SD -0.3    FSH     mIU/ML 5.8    LH     mIU/mL 2.28    Cortisol, Plasma     ug/dL 6.0  30.8   M578 ACTH     6 - 50 pg/mL 22     At our visit from 02/2022, I advised her to try to stop the DDAVP.  She was able to do so.  Sodium levels are normal: Lab Results  Component Value Date   NA 144 06/16/2022   NA 141 03/14/2022   NA 140 01/13/2022   NA 140 01/12/2022   NA 141 01/11/2022   NA 141 01/11/2022   NA 141 01/10/2022   NA  141 01/10/2022   NA 142 01/09/2022   NA 141 01/09/2022   She has a paternal aunt with a Prolactinoma  >> resected.  Pt. also has a history of chronic pancreatitis  - chronic EPI, DDD, HTN, HL, obesity, sciatica. She is on Mirena IUD.  ROS: + see HPI  Past Medical History:  Diagnosis Date   Asthma    Chronic pancreatitis (HCC)    DDD (degenerative disc disease), lumbosacral    DJD (degenerative joint disease)    Headache    Hyperlipidemia    Hypertension    Hypoglycemia    Obesity    Osteoarthritis    Prolactinoma (HCC)    Sciatica    Sleep apnea    Past Surgical History:  Procedure Laterality Date   CHOLECYSTECTOMY     COLONOSCOPY     CRANIOTOMY N/A 01/04/2022   Procedure: Endonasal endoscopic resection of pituitary tumor;  Surgeon: Jadene Pierini, MD;  Location: [facility] OR;  Service: Neurosurgery;  Laterality: N/A;  RM 20   NASAL SINUS SURGERY N/A 01/04/2022   Procedure: ENDOSCOPIC SINUS SURGERY TRANSNASAL PITUITARY REMOVAL WITH NASAL NASOSETTAL FLAP;  Surgeon: Laren Boom, DO;  Location: [facility] OR;  Service: ENT;  Laterality: N/A;   Social History   Socioeconomic History   Marital status: Divorced    Spouse name: Not on file   Number of children: 3   Years of education: Not on file   Highest education level: Not on file  Occupational History   Not on file  Tobacco Use   Smoking status: Never    Passive exposure: Never   Smokeless tobacco: Never  Vaping Use   Vaping Use: Never used  Substance and Sexual  Activity   Alcohol use: Never   Drug use: Never   Sexual activity: Yes  Other Topics Concern   Not on file  Social History Narrative   Not on file   Social Determinants of Health   Financial Resource Strain: Not on file  Food Insecurity: Not on file  Transportation Needs: Not on file  Physical Activity: Not on file  Stress: Not on file  Social Connections: Not on file  Intimate Partner Violence: Not on file   Current Outpatient Medications on  File Prior to Visit  Medication Sig Dispense Refill   albuterol (VENTOLIN HFA) 108 (90 Base) MCG/ACT inhaler Inhale 2 puffs into the lungs every 6 (six) hours as needed for wheezing or shortness of breath. 8 g 6   Ascorbic Acid (VITAMIN C) 1000 MG tablet Take 1,000 mg by mouth daily.     baclofen (LIORESAL) 10 MG tablet Take 0.5-1 tablets (5-10 mg total) by mouth 3 (three) times daily as needed for muscle spasms. (Patient not taking: Reported on 12/21/2022) 30 each 3   benzonatate (TESSALON) 200 MG capsule Take 1 capsule (200 mg total) by mouth 3 (three) times daily as needed for cough. (Patient not taking: Reported on 12/21/2022) 30 capsule 3   Butenafine HCl (MENTAX) 1 % cream Apply 1 application topically 2 (two) times daily. 24 g 1   Cholecalciferol (VITAMIN D3 PO) Take 2,000 mg by mouth daily.     Docusate Sodium (COLACE PO) Take 1 capsule by mouth daily.     EPINEPHrine 0.3 mg/0.3 mL IJ SOAJ injection Inject 0.3 mg into the muscle as needed for anaphylaxis.     fluticasone (FLONASE) 50 MCG/ACT nasal spray Place 2 sprays into both nostrils daily. 16 g 3   HYDROcodone-acetaminophen (NORCO/VICODIN) 5-325 MG tablet Take 1 tablet by mouth every 4 (four) hours as needed (pain). 30 tablet 0   levonorgestrel (MIRENA) 20 MCG/24HR IUD 1 each by Intrauterine route once.      LORazepam (ATIVAN) 1 MG tablet Take 1 tablet (1 mg total) by mouth every 8 (eight) hours as needed for anxiety (mri). 3 tablet 0   Menthol, Topical Analgesic, (BIOFREEZE) 10 % CREA Apply 1 Application topically daily as needed (pain).     ondansetron (ZOFRAN-ODT) 8 MG disintegrating tablet Take 1 tablet (8 mg total) by mouth every 8 (eight) hours as needed for nausea or vomiting. 20 tablet 1   Pancrelipase, Lip-Prot-Amyl, (CREON) 24000-76000 units CPEP Take 2 capsules by mouth See admin instructions. Take 2 capsules three times daily with food and 1 capsule twice daily with snacks.     pantoprazole (PROTONIX) 20 MG tablet Take 20 mg by  mouth daily.     psyllium (REGULOID) 0.52 g capsule Take 0.52 capsules by mouth daily.     Zinc Sulfate (ZINC 15 PO) Take 15 mg by mouth daily.     No current facility-administered medications on file prior to visit.   Allergies  Allergen Reactions   Bahia Hives, Shortness Of Breath and Swelling    Found through allergy testing, not sure which item caused the shortness of breath   [place] Grass Hives, Shortness Of Breath and Swelling    Found through allergy testing, not sure which item caused the shortness of breath   [employer], Shortness Of Breath and Swelling    Found through allergy testing, not sure which item caused the shortness of breath   Cladosporium Cladosporioides Hives, Shortness Of Breath and Swelling    Found through allergy  testing, not sure which item caused the shortness of breath   Dust Mite Extract Hives, Shortness Of Breath and Swelling    Found through allergy testing, not sure which item caused the shortness of breath   Elm Bark [Ulmus Fulva] Hives, Shortness Of Breath and Swelling    Found through allergy testing, not sure which item caused the shortness of breath   Molds & Smuts Hives, Shortness Of Breath and Swelling    Found through allergy testing, not sure which item caused the shortness of breath   Mugwort Hives, Shortness Of Breath and Swelling    Found through allergy testing, not sure which item caused the shortness of breath   Nettle [Nettle (Urtica Dioica)] Hives, Shortness Of Breath and Swelling   Red [place] Allergy Skin Test Hives, Shortness Of Breath and Swelling    Found through allergy testing, not sure which item caused the shortness of breath   Sorrel-Dock Mix [Sheep Sorrel-Yellow Dock] Hives, Shortness Of Breath and Swelling    Found through allergy testing, not sure which item caused the shortness of breath   Timothy Grass Pollen Allergen Hives, Shortness Of Breath and Swelling    Found through allergy testing, not sure which item caused  the shortness of breath   American Cockroach    Mixed Ragweed    Family History  Problem Relation Age of Onset   Hypertension Mother    Diabetes Mother    Colon cancer Mother    Arthritis Mother    Thyroid disease Mother    Asthma Mother    Osteoporosis Mother    Diabetes Father    Hypertension Father    Bell's palsy Father    Hypertension Sister    Heart murmur Sister    Lupus Sister    Hypertension Sister    Polycystic ovary syndrome Sister    Scoliosis Sister    Hypertension Brother    Heart failure Brother    Cancer Brother    Other Paternal Aunt        pituitary surgery   PE: BP 120/70   Pulse 79   Ht 5\' 9"  (1.753 m)   Wt 272 lb 3.2 oz (123.5 kg)   SpO2 98%   BMI 40.20 kg/m  Wt Readings from Last 3 Encounters:  12/25/22 272 lb 3.2 oz (123.5 kg)  12/21/22 274 lb 9.6 oz (124.6 kg)  12/02/22 274 lb (124.3 kg)   Constitutional: overweight, in NAD Eyes: EOMI, no exophthalmos ENT: no thyromegaly, no cervical lymphadenopathy Cardiovascular: RRR, No MRG Respiratory: CTA B Musculoskeletal: no deformities Skin: no rashes Neurological: no tremor with outstretched hands  ASSESSMENT: 1. Pituitary macroadenoma - s/p resection 01/09/2022  2. H/o Hyperprolactinemia  3.  Low TSH  4. DI  PLAN:  1.  Patient with history of pituitary macroadenoma, diagnosed last year, after years of elevated prolactin -The tumor was very large, impinging upon the optic chiasm.  This was suspected at the time of her visit with ophthalmology in 2023, due to visual field cuts -we checked pituitary labs, which returned grossly normal including prolactin, but patient was on bromocriptine at that time.  We also checked the prolactin level with dilution, 30-minute possible hook effect and this returned normal, also -I refer patient to neurosurgery and she had transsphenoidal surgery on 01/04/2022.  The tumor was debulked but it was not possible to be completely resected, and she had residual  tumor in the cavernous sinus, not amenable to surgery. -The pathology returned as neuroendocrine  tumor without further characterization.  We asked for further staining and  a SF1 marker was positive, pointing towards gonadotropin etiology.  This is good news, since gonadotroph tumors are usually inactive and less likely to spread. -We did stop the bromocriptine after the surgery with slight increase in prolactin afterwards but within normal range -After the surgery, she developed transient DI and she was on DDAVP but we were able to decrease and then stop this since -At last visit we repeated her pituitary hormones and they were [name] normal. -Before last visit she was seen by neuro-oncology and had stereotactic radiation treatment approximately 3 weeks prior to our last visit.  She had a pituitary MRI 10 days ago and this showed a decrease in size of her remnant tumor. -TSH was slightly low after the treatments but it normalized at last visit; we did discuss at that time that hypopituitarism is possible so we have to keep a close eye on her pituitary function -At today's visit, we order new set of TFTs and then we will have her return in 1 year, but sooner if she has any symptoms of hypopituitarism (we discussed about possible symptoms) -Overall, she feels that she has some memory problems after radiation therapy.  Otherwise, symptoms are vague and she is not sure whether they are related to the pituitary adenoma, the radiation itself, or the rest of her medical issues. -For now, we will repeat her TFTs and include the following -she will return for these fasting: Orders Placed This Encounter  Procedures   TSH   T3, free   T4, free   Prolactin   Growth hormone   Cortisol   ACTH   Insulin-like growth factor   Luteinizing hormone   Follicle stimulating hormone  -She also continues to follow with Dr. Johnsie [name]  2.  Hyperprolactinemia -Patient with 10-year history of slightly elevated prolactin,  previously treated with bromocriptine -In 2023, pituitary MRI showed a large pituitary tumor, and the high prolactin was most likely related to pituitary stalk compression -we stopped bromocriptine after her transsphenoidal surgery -At last visit, prolactin level was normal (06/2022) -Will not repeat this today  3.  Low TSH -She initially had a low TSH around the time of her surgery which normalized -She had another slightly low TSH before last visit but another TSH repeated at last visit was normal; free T4 and free T3 are also normal -Will continue to keep an eye on her thyroid tests but no intervention is needed for now  4. DI -previously on DDAVP 0.1 mg twice a day. -We reduced her dose and fortunately, she was able to stop this without discomfort -At today's visit, she reports no increased thirst -She does have a history of weak bladder wall for which she was seeing urology.  She has mild urinary incontinence, but manageable.  -Latest sodium level was normal: 144 in 05/2022  Component     Latest Ref Rng 12/29/2022  T4,Free(Direct)     0.60 - 1.60 ng/dL 7.84   TSH     6.96 - 2.95 uIU/mL 0.64   IGF-I, LC/MS     52 - 328 ng/mL 106   Z-Score (Female)     -2.0 - 2.0 SD -0.6   Prolactin     ng/mL 13.6   Cortisol, Plasma     ug/dL 4.4   Triiodothyronine,Free,Serum     2.3 - 4.2 pg/mL 3.3   FSH     mIU/ML 6.0   LH  mIU/mL 2.94   C206 ACTH     6 - 50 pg/mL 21   Growth Hormone     < OR = 7.1 ng/mL 0.1   Labs are normal, but I would like to have her back to repeat her thyroid tests and also cortisol/ACTH in approximately 3-4 months, between 8 and 9 in the morning.  Carlus Pavlov, MD PhD [facility]

## 2022-12-29 ENCOUNTER — Other Ambulatory Visit (INDEPENDENT_AMBULATORY_CARE_PROVIDER_SITE_OTHER): Payer: BC Managed Care – PPO

## 2022-12-29 DIAGNOSIS — D352 Benign neoplasm of pituitary gland: Secondary | ICD-10-CM | POA: Diagnosis not present

## 2022-12-29 DIAGNOSIS — R7989 Other specified abnormal findings of blood chemistry: Secondary | ICD-10-CM

## 2022-12-29 DIAGNOSIS — E221 Hyperprolactinemia: Secondary | ICD-10-CM | POA: Diagnosis not present

## 2022-12-29 LAB — LUTEINIZING HORMONE: LH: 2.94 m[IU]/mL

## 2022-12-29 LAB — TSH: TSH: 0.64 u[IU]/mL (ref 0.35–5.50)

## 2022-12-29 LAB — T3, FREE: T3, Free: 3.3 pg/mL (ref 2.3–4.2)

## 2022-12-29 LAB — CORTISOL: Cortisol, Plasma: 4.4 ug/dL

## 2022-12-29 LAB — T4, FREE: Free T4: 0.75 ng/dL (ref 0.60–1.60)

## 2022-12-29 LAB — FOLLICLE STIMULATING HORMONE: FSH: 6 m[IU]/mL

## 2023-01-01 LAB — ACTH: C206 ACTH: 21 pg/mL (ref 6–50)

## 2023-01-02 LAB — GROWTH HORMONE: Growth Hormone: 0.1 ng/mL (ref <=7.1)

## 2023-01-02 LAB — INSULIN-LIKE GROWTH FACTOR
IGF-I, LC/MS: 106 ng/mL (ref 52–328)
Z-Score (Female): -0.6 SD (ref ?–2.0)

## 2023-01-02 LAB — PROLACTIN: Prolactin: 13.6 ng/mL

## 2023-01-02 NOTE — Addendum Note (Signed)
Addended by: Carlus Pavlov on: 01/02/2023 01:52 PM   Modules accepted: Orders

## 2023-01-05 ENCOUNTER — Other Ambulatory Visit: Payer: BC Managed Care – PPO

## 2023-01-05 ENCOUNTER — Ambulatory Visit: Payer: BC Managed Care – PPO | Admitting: Internal Medicine

## 2023-01-25 ENCOUNTER — Ambulatory Visit: Payer: BC Managed Care – PPO | Admitting: Emergency Medicine

## 2023-01-25 ENCOUNTER — Encounter: Payer: Self-pay | Admitting: Emergency Medicine

## 2023-01-25 VITALS — BP 124/80 | HR 69 | Ht 69.0 in | Wt 280.0 lb

## 2023-01-25 DIAGNOSIS — G4733 Obstructive sleep apnea (adult) (pediatric): Secondary | ICD-10-CM

## 2023-01-25 DIAGNOSIS — J4520 Mild intermittent asthma, uncomplicated: Secondary | ICD-10-CM | POA: Diagnosis not present

## 2023-01-25 NOTE — Addendum Note (Signed)
Addended by: Maurene Capes on: 01/25/2023 04:13 PM   Modules accepted: Orders

## 2023-01-25 NOTE — Assessment & Plan Note (Signed)
She has not been able to wear her CPAP.  Initially it was post resection of her pituitary tumor but now it seems that her device is not working correctly.  She has been cleared to wear it postoperatively.  She uses nasal pillows but it feels like the device is not blowing.  She does have a chinstrap.  We will ask [employer] to help [place] troubleshoot

## 2023-01-25 NOTE — Patient Instructions (Signed)
We will ask [employer] to evaluate your CPAP machine, assess for any need of repairs since it is not blowing air correctly.  Hopefully we will be able to get this fixed so that we can try to get you back on your CPAP reliably. Continue your nasal saline rinses and your Flonase as you have been using them Keep your albuterol available to use 2 puffs when you needed for shortness of breath, chest tightness, wheezing. Follow with [facility] in 2 months so we can assess how you are doing on the CPAP. Follow Dr. Delton Coombes in 6 months, sooner if you have problems.

## 2023-01-25 NOTE — Assessment & Plan Note (Signed)
Intermittent symptoms, reasonable exertional tolerance.  She rarely uses her albuterol.  Hold off on ICS or scheduled BD at this time.  Continue to control GERD and rhinitis as potential contributors to asthma symptoms.Marland Kitchen

## 2023-01-25 NOTE — Progress Notes (Signed)
   Subjective:    Patient ID: Beth [name], female    DOB: 1976/05/18, 47 y.o.   MRN: [id number]  HPI  ROV 06/13/22 --follow-up visit today for 47 year old woman with history of asthma and OSA on CPAP.  Underwent partial resection of a large pituitary tumor in July 2023 with rhinorrhea associated with CSF leak.  She has had radiation therapy as well to treat remaining tumor.  She is still unable to wear her CPAP machine due to the procedure - hopefully she will be able to restart soon.   She has albuterol available to use if needed, is not on any scheduled inhaler medication.  She reports today that she uses about one a day, usually at night.  She has GERD and is on Protonix.  ROV 01/25/2023 --47 year old woman with a history of asthma, OSA on CPAP, pituitary tumor resected 12/2021 with a CSF leak and associated rhinorrhea, subsequently treated with XRT.  She was off CPAP while this was being addressed.  She has not been able to get back on it - was getting a leak alarm and wasn't getting the right pressure, felt smothering like air wasn't blowing.    Currently managed on Flonase, Protonix, uses Tessalon Perles for chronic cough.  She has albuterol which she uses very rarely. No flares. She has been walking more. She keeps her SABA available. She has quite a bit of drainage post-op, uses nasal rinses, does impact her cough.     Review of Systems As per HPI     Objective:   Physical Exam Vitals:   47/01/24 1512  BP: 124/80  Pulse: 69  SpO2: 99%  Weight: 280 lb (127 kg)  Height: 5\' 9"  (1.753 m)     Gen: Pleasant, obese woman, in no distress,  normal affect  ENT: No lesions,  mouth clear,  oropharynx clear, no postnasal drip  Neck: No JVD, no stridor  Lungs: No use of accessory muscles, no wheeze or crackles.   Cardiovascular: RRR, heart sounds normal, no murmur or gallops, no peripheral edema  Musculoskeletal: No deformities, no cyanosis or clubbing  Neuro: alert, awake, non  focal  Skin: Warm, no lesions or rash      Assessment & Plan:  Obstructive sleep apnea She has not been able to wear her CPAP.  Initially it was post resection of her pituitary tumor but now it seems that her device is not working correctly.  She has been cleared to wear it postoperatively.  She uses nasal pillows but it feels like the device is not blowing.  She does have a chinstrap.  We will ask [employer] to help [place] troubleshoot  Mild intermittent asthma Intermittent symptoms, reasonable exertional tolerance.  She rarely uses her albuterol.  Hold off on ICS or scheduled BD at this time.  Continue to control GERD and rhinitis as potential contributors to asthma symptoms.Levy Pupa, MD, PhD 01/25/2023, 3:50 PM  [facility] [phone] or if no answer [phone]

## 2023-03-30 ENCOUNTER — Encounter: Payer: Self-pay | Admitting: Nurse Practitioner

## 2023-03-30 ENCOUNTER — Ambulatory Visit: Payer: BC Managed Care – PPO | Admitting: Nurse Practitioner

## 2023-03-30 VITALS — BP 114/74 | HR 84 | Temp 97.4°F | Ht 69.0 in | Wt 281.6 lb

## 2023-03-30 DIAGNOSIS — E66813 Obesity, class 3: Secondary | ICD-10-CM | POA: Insufficient documentation

## 2023-03-30 DIAGNOSIS — Z6841 Body Mass Index (BMI) 40.0 and over, adult: Secondary | ICD-10-CM

## 2023-03-30 DIAGNOSIS — G4733 Obstructive sleep apnea (adult) (pediatric): Secondary | ICD-10-CM

## 2023-03-30 DIAGNOSIS — J4520 Mild intermittent asthma, uncomplicated: Secondary | ICD-10-CM

## 2023-03-30 NOTE — Assessment & Plan Note (Signed)
OSA on CPAP. Excellent compliance and control. Receives benefit from use. Understands proper care of device. Aware of safe driving practices.

## 2023-03-30 NOTE — Assessment & Plan Note (Signed)
BMI 41. Healthy weight loss encouraged.  

## 2023-03-30 NOTE — Assessment & Plan Note (Signed)
Stable without recent exacerbations or hospitalizations. Infrequent use of SABA. Advised her to utilize prior to strenuous exertion. No indication for ICS or scheduled bronchodilator at this time. Action plan in place.   Patient Instructions  Continue Albuterol inhaler 2 puffs every 6 hours as needed for shortness of breath or wheezing. Notify if symptoms persist despite rescue inhaler/neb use.  Continue flonase 2 sprays each nostril daily Continue CPAP nightly   Keep working on staying active/exercising regularly.   Follow up in 6 months with Dr. Delton Coombes. If symptoms do not improve or worsen, please contact office for sooner follow up or seek emergency care.

## 2023-03-30 NOTE — Progress Notes (Addendum)
@Patient  ID: Beth [name], female    DOB: [birth year], 47 y.o.   MRN: [id number]  Chief Complaint  Patient presents with   Follow-up    OSA on CPAP    Referring provider: Grayce Sessions, NP  HPI: 47 year old female, never smoker followed for mild intermittent asthma and OSA on CPAP. She is a patient of Dr. Kavin Leech and last seen in office 01/25/2023. Past medical history significant for HTN, migraines, DJD, HLD, hx of pituitary resection with CSF leak and s/p XRT.   TEST/EVENTS:  02/29/2020 NPSG: AHI 19.3/h, SpO2 low 80% 03/22/2020 PFT: FVC 74, FEV1 80, ratio 93, TLC 76, DLCOcor 78. No BD  01/25/2023: OV with Dr. Delton Coombes. Off CPAP while dealing with pituitary tumor. She's tried to get back on it but felt like something was smothering her and air not blowing. Requested DME provide assistance. Breathing stable. Walking more. Infrequent use of albuterol. Hold off on ICS or scheduled BD.  03/30/2023: Today - follow up Patient presents today for follow up. She has been doing well since she was here last. Breathing is about the same. She's been working on exercising more. She does sometimes get chest tightness with more strenuous activities. Doesn't predose with albuterol. She does get benefit from her rescue. Uses it twice a month or so. No wheezing, cough, chest congestion, PND. No exacerbations or hospitalizations.  She is back on her CPAP. Wearing it nightly. Sometimes she'll fall asleep on the couch but once she wakes up, she goes to the bed and puts it on. Sleeps well with it. Wakes feeling rested most days. No issues with drowsy driving or excessive daytime fatigue.   02/28/2023-03/29/2023:  CPAP 5-20 cmH2O 30/30 days; 97% >4 hr; average use 6 hr 49 min Pressure 95th 10.9 Leaks 95th 2.3 AHI 0.2  Allergies  Allergen Reactions   Bahia Hives, Shortness Of Breath and Swelling    Found through allergy testing, not sure which item caused the shortness of breath   [place] Grass Hives,  Shortness Of Breath and Swelling    Found through allergy testing, not sure which item caused the shortness of breath   [employer], Shortness Of Breath and Swelling    Found through allergy testing, not sure which item caused the shortness of breath   Cladosporium Cladosporioides Hives, Shortness Of Breath and Swelling    Found through allergy testing, not sure which item caused the shortness of breath   Dust Mite Extract Hives, Shortness Of Breath and Swelling    Found through allergy testing, not sure which item caused the shortness of breath   Elm Bark [Ulmus Fulva] Hives, Shortness Of Breath and Swelling    Found through allergy testing, not sure which item caused the shortness of breath   Molds & Smuts Hives, Shortness Of Breath and Swelling    Found through allergy testing, not sure which item caused the shortness of breath   Mugwort Hives, Shortness Of Breath and Swelling    Found through allergy testing, not sure which item caused the shortness of breath   Nettle [Nettle (Urtica Dioica)] Hives, Shortness Of Breath and Swelling   Red [place] Allergy Skin Test Hives, Shortness Of Breath and Swelling    Found through allergy testing, not sure which item caused the shortness of breath   Sorrel-Dock Mix [Sheep Sorrel-Yellow Dock] Hives, Shortness Of Breath and Swelling    Found through allergy testing, not sure which item caused the shortness of breath   Marcial Pacas  Grass Pollen Allergen Hives, Shortness Of Breath and Swelling    Found through allergy testing, not sure which item caused the shortness of breath   American Cockroach    Mixed Ragweed     Immunization History  Administered Date(s) Administered   PFIZER(Purple Top)SARS-COV-2 Vaccination 04/23/2020, 05/14/2020, 10/30/2020    Past Medical History:  Diagnosis Date   Asthma    Chronic pancreatitis (HCC)    DDD (degenerative disc disease), lumbosacral    DJD (degenerative joint disease)    Headache    Hyperlipidemia     Hypertension    Hypoglycemia    Obesity    Osteoarthritis    Prolactinoma (HCC)    Sciatica    Sleep apnea     Tobacco History: Social History   Tobacco Use  Smoking Status Never   Passive exposure: Never  Smokeless Tobacco Never   Counseling given: Not Answered   Outpatient Medications Prior to Visit  Medication Sig Dispense Refill   albuterol (VENTOLIN HFA) 108 (90 Base) MCG/ACT inhaler Inhale 2 puffs into the lungs every 6 (six) hours as needed for wheezing or shortness of breath. 8 g 6   Ascorbic Acid (VITAMIN C) 1000 MG tablet Take 1,000 mg by mouth daily.     baclofen (LIORESAL) 10 MG tablet Take 0.5-1 tablets (5-10 mg total) by mouth 3 (three) times daily as needed for muscle spasms. 30 each 3   benzonatate (TESSALON) 200 MG capsule Take 1 capsule (200 mg total) by mouth 3 (three) times daily as needed for cough. 30 capsule 3   Butenafine HCl (MENTAX) 1 % cream Apply 1 application topically 2 (two) times daily. 24 g 1   Cholecalciferol (VITAMIN D3 PO) Take 2,000 mg by mouth daily.     Docusate Sodium (COLACE PO) Take 1 capsule by mouth daily.     EPINEPHrine 0.3 mg/0.3 mL IJ SOAJ injection Inject 0.3 mg into the muscle as needed for anaphylaxis.     fluticasone (FLONASE) 50 MCG/ACT nasal spray Place 2 sprays into both nostrils daily. 16 g 3   HYDROcodone-acetaminophen (NORCO/VICODIN) 5-325 MG tablet Take 1 tablet by mouth every 4 (four) hours as needed (pain). 30 tablet 0   levonorgestrel (MIRENA) 20 MCG/24HR IUD 1 each by Intrauterine route once.      LORazepam (ATIVAN) 1 MG tablet Take 1 tablet (1 mg total) by mouth every 8 (eight) hours as needed for anxiety (mri). 3 tablet 0   Menthol, Topical Analgesic, (BIOFREEZE) 10 % CREA Apply 1 Application topically daily as needed (pain).     ondansetron (ZOFRAN-ODT) 8 MG disintegrating tablet Take 1 tablet (8 mg total) by mouth every 8 (eight) hours as needed for nausea or vomiting. 20 tablet 1   Pancrelipase, Lip-Prot-Amyl,  (CREON) 24000-76000 units CPEP Take 2 capsules by mouth See admin instructions. Take 2 capsules three times daily with food and 1 capsule twice daily with snacks.     pantoprazole (PROTONIX) 20 MG tablet Take 20 mg by mouth daily.     psyllium (REGULOID) 0.52 g capsule Take 0.52 capsules by mouth daily.     Zinc Sulfate (ZINC 15 PO) Take 15 mg by mouth daily.     No facility-administered medications prior to visit.     Review of Systems:   Constitutional: No weight loss or gain, night sweats, fevers, chills, fatigue or lassitude.  HEENT: No headaches, difficulty swallowing, tooth/dental problems, or sore throat. No sneezing, itching, ear ache, nasal congestion, or post nasal drip CV:  No chest pain, orthopnea, PND, swelling in lower extremities, anasarca, dizziness, palpitations, syncope Resp: +baseline shortness of breath with exertion. No excess mucus or change in color of mucus. No productive or non-productive. No hemoptysis. No wheezing.  No chest wall deformity GI:  No heartburn, indigestion GU: No nocturia  Skin: No rash, lesions, ulcerations MSK:  +chronic joint pain Neuro: No dizziness or lightheadedness.  Psych: No depression or anxiety. Mood stable.     Physical Exam:  BP 114/74 (BP Location: Left Arm, Patient Position: Sitting, Cuff Size: Normal)   Pulse 84   Temp (!) 97.4 F (36.3 C) (Temporal)   Ht 5\' 9"  (1.753 m)   Wt 281 lb 9.6 oz (127.7 kg)   SpO2 94%   BMI 41.59 kg/m   GEN: Pleasant, interactive, well-appearing; obese; in no acute distress. HEENT:  Normocephalic and atraumatic. PERRLA. Sclera white. Nasal turbinates pink, moist and patent bilaterally. No rhinorrhea present. Oropharynx pink and moist, without exudate or edema. No lesions, ulcerations, or postnasal drip.  NECK:  Supple w/ fair ROM. Thyroid symmetrical with no goiter or nodules palpated. No lymphadenopathy.   CV: RRR, no m/r/g, no peripheral edema. Pulses intact, +2 bilaterally. No cyanosis,  pallor or clubbing. PULMONARY:  Unlabored, regular breathing. Clear bilaterally A&P w/o wheezes/rales/rhonchi. No accessory muscle use.  GI: BS present and normoactive. Soft, non-tender to palpation. No organomegaly or masses detected.  MSK: No erythema, warmth or tenderness. Cap refil <2 sec all extrem. No deformities or joint swelling noted.  Neuro: A/Ox3. No focal deficits noted.   Skin: Warm, no lesions or rashe Psych: Normal affect and behavior. Judgement and thought content appropriate.     Lab Results:  CBC    Component Value Date/Time   WBC 5.2 06/16/2022 1048   WBC 11.8 (H) 01/05/2022 0248   RBC 4.78 06/16/2022 1048   RBC 4.13 01/05/2022 0248   HGB 12.8 06/16/2022 1048   HCT 39.7 06/16/2022 1048   PLT 311 06/16/2022 1048   MCV 83 06/16/2022 1048   MCH 26.8 06/16/2022 1048   MCH 27.6 01/05/2022 0248   MCHC 32.2 06/16/2022 1048   MCHC 33.3 01/05/2022 0248   RDW 15.3 06/16/2022 1048   LYMPHSABS 2.4 06/16/2022 1048   MONOABS 0.7 12/19/2019 0349   EOSABS 0.2 06/16/2022 1048   BASOSABS 0.0 06/16/2022 1048    BMET    Component Value Date/Time   NA 144 06/16/2022 1048   K 4.4 06/16/2022 1048   CL 105 06/16/2022 1048   CO2 25 06/16/2022 1048   GLUCOSE 95 06/16/2022 1048   GLUCOSE 105 (H) 01/13/2022 0500   BUN 8 06/16/2022 1048   CREATININE 1.01 (H) 06/16/2022 1048   CREATININE 0.86 05/22/2022 1606   CREATININE 0.86 10/21/2021 1610   CALCIUM 9.4 06/16/2022 1048   GFRNONAA >60 05/22/2022 1606   GFRAA 98 01/02/2020 0955    BNP    Component Value Date/Time   BNP 84.1 12/19/2019 0349     Imaging:  No results found.  Administration History     None          Latest Ref Rng & Units 03/22/2020    4:11 PM  PFT Results  FVC-Pre L 2.68   FVC-Predicted Pre % 74   FVC-Post L 2.78   FVC-Predicted Post % 76   Pre FEV1/FVC % % 89   Post FEV1/FCV % % 93   FEV1-Pre L 2.37   FEV1-Predicted Pre % 80   FEV1-Post L 2.57   DLCO  uncorrected ml/min/mmHg 20.60    DLCO UNC% % 80   DLCO corrected ml/min/mmHg 19.90   DLCO COR %Predicted % 78   DLVA Predicted % 123   TLC L 4.42   TLC % Predicted % 76   RV % Predicted % 70     No results found for: "NITRICOXIDE"      Assessment & Plan:   Mild intermittent asthma Stable without recent exacerbations or hospitalizations. Infrequent use of SABA. Advised her to utilize prior to strenuous exertion. No indication for ICS or scheduled bronchodilator at this time. Action plan in place.   Patient Instructions  Continue Albuterol inhaler 2 puffs every 6 hours as needed for shortness of breath or wheezing. Notify if symptoms persist despite rescue inhaler/neb use.  Continue flonase 2 sprays each nostril daily Continue CPAP nightly   Keep working on staying active/exercising regularly.   Follow up in 6 months with Dr. Delton Coombes. If symptoms do not improve or worsen, please contact office for sooner follow up or seek emergency care.    Obstructive sleep apnea OSA on CPAP. Excellent compliance and control. Receives benefit from use. Understands proper care of device. Aware of safe driving practices.   Class 3 severe obesity due to excess calories with body mass index (BMI) of 40.0 to 44.9 in adult (HCC) BMI 41. Healthy weight loss encouraged    I spent 25 minutes of dedicated to the care of this patient on the date of this encounter to include pre-visit review of records, face-to-face time with the patient discussing conditions above, post visit ordering of testing, clinical documentation with the electronic health record, making appropriate referrals as documented, and communicating necessary findings to members of the patients care team.  Noemi Chapel, NP 03/30/2023  Pt aware and understands NP's role.

## 2023-03-30 NOTE — Patient Instructions (Addendum)
Continue Albuterol inhaler 2 puffs every 6 hours as needed for shortness of breath or wheezing. Notify if symptoms persist despite rescue inhaler/neb use.  Continue flonase 2 sprays each nostril daily Continue CPAP nightly   Keep working on staying active/exercising regularly.   Follow up in 6 months with Dr. Delton Coombes. If symptoms do not improve or worsen, please contact office for sooner follow up or seek emergency care.

## 2023-04-20 ENCOUNTER — Other Ambulatory Visit (INDEPENDENT_AMBULATORY_CARE_PROVIDER_SITE_OTHER): Payer: BC Managed Care – PPO

## 2023-04-20 DIAGNOSIS — R7989 Other specified abnormal findings of blood chemistry: Secondary | ICD-10-CM | POA: Diagnosis not present

## 2023-04-20 DIAGNOSIS — D352 Benign neoplasm of pituitary gland: Secondary | ICD-10-CM

## 2023-04-20 LAB — CORTISOL: Cortisol, Plasma: 6.8 ug/dL

## 2023-04-20 LAB — TSH: TSH: 1.29 u[IU]/mL (ref 0.35–5.50)

## 2023-04-20 LAB — T3, FREE: T3, Free: 3.1 pg/mL (ref 2.3–4.2)

## 2023-04-20 LAB — T4, FREE: Free T4: 0.58 ng/dL — ABNORMAL LOW (ref 0.60–1.60)

## 2023-04-25 LAB — ACTH: C206 ACTH: 26 pg/mL (ref 6–50)

## 2023-05-23 ENCOUNTER — Other Ambulatory Visit: Payer: Self-pay

## 2023-05-23 ENCOUNTER — Ambulatory Visit (HOSPITAL_COMMUNITY): Payer: BC Managed Care – PPO

## 2023-05-23 ENCOUNTER — Other Ambulatory Visit (HOSPITAL_COMMUNITY): Payer: BC Managed Care – PPO

## 2023-05-23 ENCOUNTER — Ambulatory Visit (HOSPITAL_COMMUNITY)
Admission: EM | Admit: 2023-05-23 | Discharge: 2023-05-23 | Disposition: A | Discharge status: Home / Self Care | Payer: BC Managed Care – PPO | Attending: Emergency Medicine | Admitting: Emergency Medicine

## 2023-05-23 ENCOUNTER — Encounter (HOSPITAL_COMMUNITY): Payer: Self-pay | Admitting: Emergency Medicine

## 2023-05-23 DIAGNOSIS — R059 Cough, unspecified: Secondary | ICD-10-CM | POA: Diagnosis not present

## 2023-05-23 DIAGNOSIS — R051 Acute cough: Secondary | ICD-10-CM

## 2023-05-23 DIAGNOSIS — J069 Acute upper respiratory infection, unspecified: Secondary | ICD-10-CM

## 2023-05-23 DIAGNOSIS — R0989 Other specified symptoms and signs involving the circulatory and respiratory systems: Secondary | ICD-10-CM | POA: Diagnosis not present

## 2023-05-23 MED ORDER — AZITHROMYCIN 250 MG PO TABS: 250.0000 mg | ORAL_TABLET | Freq: Every day | ORAL | 0 refills | Status: DC

## 2023-05-23 MED ORDER — PROMETHAZINE-DM 6.25-15 MG/5ML PO SYRP: 5.0000 mL | ORAL_SOLUTION | Freq: Four times a day (QID) | ORAL | 0 refills | Status: DC | PRN

## 2023-05-23 NOTE — Discharge Instructions (Addendum)
Take all antibiotics as prescribed and until finished.  You can take them with food to help prevent gastrointestinal upset.  Use the cough medicine as needed, this may cause drowsiness.  Do not drink or drive on this medication.  You can sleep with a humidifier and take 1200 mg of Mucinex daily to help loosen her congestion.  Your symptoms should improve by Monday, if no improvement or any changes please follow-up with your primary care provider or return to clinic.

## 2023-05-23 NOTE — ED Triage Notes (Signed)
Increase cough, congestion and HA for the past 3 weeks not getting any better.

## 2023-05-23 NOTE — ED Provider Notes (Signed)
[facility]    CSN: [id number] Arrival date & time: 05/23/23  1624      History   Chief Complaint Chief Complaint  Patient presents with   URI    Increase cough, congestion and HA for the past 3 weeks not getting any better.    HPI Beth [name] is a 47 y.o. female.   Patient presents to clinic with her daughter, who is sick with similar symptoms.  She has had a worsening cough over the past 3 weeks with wheezing and congestion.  Reports her cough is productive.  She came to clinic because she was getting worse has developed headache, lung pain and sore throat.  Reports she is a long COVID-patient and already has diminished lung capacity.  She uses a CPAP at night. At home she has been doing nasal sprays, nasal rinses, over-the-counter liquid cold and flu medication multiple other interventions without any improvement.  Tessalon w/o relief. Has used her albuterol inhaler, has not really helped.  No fevers. No N/V/D.   The history is provided by the patient and medical records.  URI   Past Medical History:  Diagnosis Date   Asthma    Chronic pancreatitis (HCC)    DDD (degenerative disc disease), lumbosacral    DJD (degenerative joint disease)    Headache    Hyperlipidemia    Hypertension    Hypoglycemia    Obesity    Osteoarthritis    Prolactinoma (HCC)    Sciatica    Sleep apnea     Patient Active Problem List   Diagnosis Date Noted   Class 3 severe obesity due to excess calories with body mass index (BMI) of 40.0 to 44.9 in adult [facility]) 03/30/2023   Hypoglycemia 06/16/2022   Status post transsphenoidal pituitary resection (HCC) 01/04/2022   Low TSH level 10/21/2021   Dyslipidemia 07/15/2021   Osteoarthritis 08/20/2020   Sciatica 07/30/2020   DJD (degenerative joint disease)    Obstructive sleep apnea 03/11/2020   Snoring 02/05/2020   Pneumonia due to COVID-19 virus 12/15/2019   Acute respiratory failure with hypoxia (HCC) 12/15/2019    Hypokalemia 12/15/2019   Asthma without acute exacerbation 12/15/2019   Encounter for management of intrauterine contraceptive device (IUD) 06/09/2019   Essential hypertension 06/09/2019   Fatigue 06/09/2019   Slow transit constipation 06/09/2019   Hyperprolactinemia (HCC) 02/15/2018   Migraine 10/12/2017   Chronic pancreatitis (HCC) 11/09/2016   Prolactinoma (HCC) 11/09/2016   BMI 38.0-38.9,adult 11/09/2016   Candida rash of groin 11/09/2016   Mild intermittent asthma 11/09/2016   Mixed hyperlipidemia 11/09/2016    Past Surgical History:  Procedure Laterality Date   CHOLECYSTECTOMY     COLONOSCOPY     CRANIOTOMY N/A 01/04/2022   Procedure: Endonasal endoscopic resection of pituitary tumor;  Surgeon: Jadene Pierini, MD;  Location: [facility] OR;  Service: Neurosurgery;  Laterality: N/A;  RM 20   NASAL SINUS SURGERY N/A 01/04/2022   Procedure: ENDOSCOPIC SINUS SURGERY TRANSNASAL PITUITARY REMOVAL WITH NASAL NASOSETTAL FLAP;  Surgeon: Laren Boom, DO;  Location: [facility] OR;  Service: ENT;  Laterality: N/A;    OB History   No obstetric history on file.      Home Medications    Prior to Admission medications   Medication Sig Start Date End Date Taking? Authorizing Provider  azithromycin (ZITHROMAX) 250 MG tablet Take 1 tablet (250 mg total) by mouth daily. Take first 2 tablets together, then 1 every day until finished. 05/23/23  Yes Rinaldo Ratel, [place]  N, FNP  promethazine-dextromethorphan (PROMETHAZINE-DM) 6.25-15 MG/5ML syrup Take 5 mLs by mouth 4 (four) times daily as needed for cough. 05/23/23  Yes Rinaldo Ratel, [place] N, FNP  albuterol (VENTOLIN HFA) 108 (90 Base) MCG/ACT inhaler Inhale 2 puffs into the lungs every 6 (six) hours as needed for wheezing or shortness of breath. 06/13/22   Leslye Peer, MD  Ascorbic Acid (VITAMIN C) 1000 MG tablet Take 1,000 mg by mouth daily.    [provider]  baclofen (LIORESAL) 10 MG tablet Take 0.5-1 tablets (5-10 mg total) by mouth 3  (three) times daily as needed for muscle spasms. 03/03/20   Hilts, Casimiro Needle, MD  benzonatate (TESSALON) 200 MG capsule Take 1 capsule (200 mg total) by mouth 3 (three) times daily as needed for cough. 06/13/22   Leslye Peer, MD  Butenafine HCl (MENTAX) 1 % cream Apply 1 application topically 2 (two) times daily. 06/11/20   Grayce Sessions, NP  Cholecalciferol (VITAMIN D3 PO) Take 2,000 mg by mouth daily.    [provider]  Docusate Sodium (COLACE PO) Take 1 capsule by mouth daily.    [provider]  EPINEPHrine 0.3 mg/0.3 mL IJ SOAJ injection Inject 0.3 mg into the muscle as needed for anaphylaxis.    [provider]  fluticasone (FLONASE) 50 MCG/ACT nasal spray Place 2 sprays into both nostrils daily. 06/22/21   Leslye Peer, MD  HYDROcodone-acetaminophen (NORCO/VICODIN) 5-325 MG tablet Take 1 tablet by mouth every 4 (four) hours as needed (pain). 01/13/22   Jadene Pierini, MD  levonorgestrel (MIRENA) 20 MCG/24HR IUD 1 each by Intrauterine route once.     [provider]  LORazepam (ATIVAN) 1 MG tablet Take 1 tablet (1 mg total) by mouth every 8 (eight) hours as needed for anxiety (mri). 12/21/22   Henreitta Leber, MD  Menthol, Topical Analgesic, (BIOFREEZE) 10 % CREA Apply 1 Application topically daily as needed (pain).    [provider]  ondansetron (ZOFRAN-ODT) 8 MG disintegrating tablet Take 1 tablet (8 mg total) by mouth every 8 (eight) hours as needed for nausea or vomiting. 06/09/22   Lonie Peak, MD  Pancrelipase, Lip-Prot-Amyl, (CREON) 24000-76000 units CPEP Take 2 capsules by mouth See admin instructions. Take 2 capsules three times daily with food and 1 capsule twice daily with snacks. 04/20/17   [provider]  pantoprazole (PROTONIX) 20 MG tablet Take 20 mg by mouth daily. 11/25/19   [provider]  psyllium (REGULOID) 0.52 g capsule Take 0.52 capsules by mouth daily.    [provider]  Zinc  Sulfate (ZINC 15 PO) Take 15 mg by mouth daily.    [provider]    Family History Family History  Problem Relation Age of Onset   Hypertension Mother    Diabetes Mother    Colon cancer Mother    Arthritis Mother    Thyroid disease Mother    Asthma Mother    Osteoporosis Mother    Diabetes Father    Hypertension Father    Bell's palsy Father    Hypertension Sister    Heart murmur Sister    Lupus Sister    Hypertension Sister    Polycystic ovary syndrome Sister    Scoliosis Sister    Hypertension Brother    Heart failure Brother    Cancer Brother    Other Paternal Aunt        pituitary surgery    Social History Social History   Tobacco Use  Smoking status: Never    Passive exposure: Never   Smokeless tobacco: Never  Vaping Use   Vaping status: Never Used  Substance Use Topics   Alcohol use: Never   Drug use: Never     Allergies   [place], [place] grass, Cedar, Cladosporium cladosporioides, Dust mite extract, Elm bark [ulmus fulva], Molds & smuts, Mugwort, Nettle [nettle (urtica dioica)], Red mulberry allergy skin test, Sorrel-dock mix [sheep sorrel-yellow dock], Timothy grass pollen allergen, American cockroach, and Mixed ragweed   Review of Systems Review of Systems  Per HPI   Physical Exam Triage Vital Signs ED Triage Vitals [05/23/23 1651]  Encounter Vitals Group     BP 126/86     Systolic BP Percentile      Diastolic BP Percentile      Pulse Rate 83     Resp 16     Temp 98.7 F (37.1 C)     Temp Source Oral     SpO2 97 %     Weight      Height      Head Circumference      Peak Flow      Pain Score 7     Pain Loc      Pain Education      Exclude from Growth Chart    No data found.  Updated Vital Signs BP 126/86 (BP Location: Right Arm)   Pulse 83   Temp 98.7 F (37.1 C) (Oral)   Resp 16   SpO2 97%   Visual Acuity Right Eye Distance:   Left Eye Distance:   Bilateral Distance:    Right Eye Near:   Left Eye Near:     Bilateral Near:     Physical Exam Vitals and nursing note reviewed.  Constitutional:      Appearance: Normal appearance.  HENT:     Head: Normocephalic and atraumatic.     Right Ear: External ear normal.     Left Ear: External ear normal.     Nose: Nose normal.     Mouth/Throat:     Mouth: Mucous membranes are moist.  Eyes:     Conjunctiva/sclera: Conjunctivae normal.  Cardiovascular:     Rate and Rhythm: Normal rate and regular rhythm.     Heart sounds: Normal heart sounds. No murmur heard. Pulmonary:     Effort: Pulmonary effort is normal. No respiratory distress.     Breath sounds: Normal breath sounds.  Musculoskeletal:        General: Normal range of motion.  Skin:    General: Skin is warm and dry.  Neurological:     General: No focal deficit present.     Mental Status: She is alert and oriented to person, place, and time.  Psychiatric:        Mood and Affect: Mood normal.        Behavior: Behavior normal.      UC Treatments / Results  Labs (all labs ordered are listed, but only abnormal results are displayed) Labs Reviewed - No data to display  EKG   Radiology DG Chest 2 View  Result Date: 05/23/2023 CLINICAL DATA:  Worsening cough and chest congestion for 3 weeks. EXAM: CHEST - 2 VIEW COMPARISON:  06/13/2022 FINDINGS: The heart size and mediastinal contours are within normal limits. Both lungs are clear. The visualized skeletal structures are unremarkable. IMPRESSION: No active cardiopulmonary disease. Electronically Signed   By: Danae Orleans M.D.   On: 05/23/2023 17:53  Procedures Procedures (including critical care time)  Medications Ordered in UC Medications - No data to display  Initial Impression / Assessment and Plan / UC Course  I have reviewed the triage vital signs and the nursing notes.  Pertinent labs & imaging results that were available during my care of the patient were reviewed by me and considered in my medical decision making (see  chart for details).  Vitals and triage reviewed, patient is hemodynamically stable.  Lungs are vesicular, heart with regular rate and rhythm.  Chest x-ray obtained due to duration of symptoms, radiology overread shows no acute cardiopulmonary disease.  Will cover with azithromycin for an atypical infection due to prolonged duration.  Symptomatic cough management discussed.  Plan of care, follow-up care return precautions given, no questions at this time.     Final Clinical Impressions(s) / UC Diagnoses   Final diagnoses:  Acute upper respiratory infection  Acute cough     Discharge Instructions      Take all antibiotics as prescribed and until finished.  You can take them with food to help prevent gastrointestinal upset.  Use the cough medicine as needed, this may cause drowsiness.  Do not drink or drive on this medication.  You can sleep with a humidifier and take 1200 mg of Mucinex daily to help loosen her congestion.  Your symptoms should improve by Monday, if no improvement or any changes please follow-up with your primary care provider or return to clinic.     ED Prescriptions     Medication Sig Dispense Auth. Provider   promethazine-dextromethorphan (PROMETHAZINE-DM) 6.25-15 MG/5ML syrup Take 5 mLs by mouth 4 (four) times daily as needed for cough. 118 mL Rinaldo Ratel, [place] N, FNP   azithromycin (ZITHROMAX) 250 MG tablet Take 1 tablet (250 mg total) by mouth daily. Take first 2 tablets together, then 1 every day until finished. 6 tablet Amory Zbikowski, [place] N, [place]      PDMP not reviewed this encounter.   Kamarii Buren, [place] N, [place] 05/23/23 [phone]

## 2023-06-07 ENCOUNTER — Ambulatory Visit
Admission: RE | Admit: 2023-06-07 | Discharge: 2023-06-07 | Disposition: A | Discharge status: Home / Self Care | Payer: BC Managed Care – PPO | Source: Ambulatory Visit | Attending: Internal Medicine | Admitting: Internal Medicine

## 2023-06-07 DIAGNOSIS — D443 Neoplasm of uncertain behavior of pituitary gland: Secondary | ICD-10-CM | POA: Diagnosis not present

## 2023-06-07 DIAGNOSIS — D352 Benign neoplasm of pituitary gland: Secondary | ICD-10-CM

## 2023-06-07 MED ORDER — GADOPICLENOL 0.5 MMOL/ML IV SOLN
10.0000 mL | Freq: Once | INTRAVENOUS | Status: AC | PRN
  Administered 2023-06-07: 10 mL via INTRAVENOUS

## 2023-06-11 ENCOUNTER — Inpatient Hospital Stay: Payer: BC Managed Care – PPO

## 2023-06-14 ENCOUNTER — Inpatient Hospital Stay: Payer: BC Managed Care – PPO | Attending: Internal Medicine | Admitting: Internal Medicine

## 2023-06-14 VITALS — BP 124/71 | HR 106 | Temp 97.2°F | Resp 18 | Wt 274.7 lb

## 2023-06-14 DIAGNOSIS — D352 Benign neoplasm of pituitary gland: Secondary | ICD-10-CM | POA: Insufficient documentation

## 2023-06-14 NOTE — Progress Notes (Signed)
[facility] at [facility] [address]  [place], [place] [address] [phone]   Interval Evaluation  Date of Service: 06/14/23 Patient Name: Beth [name] Patient MRN: [id number] Patient DOB: 11-09-1975 Provider: Henreitta Leber, MD  Identifying Statement:  Beth [name] is a 47 y.o. female with  pituitary   adenoma  who presents for initial consultation and evaluation.    Referring Provider: Grayce Sessions, NP [address] [place],  [place] [address]  Oncologic History: 01/04/22: Trans-sphenoidal debulking resection of macroadenoma with Dr. Maurice Small 06/14/22: Completes SRS 25/5 with Dr. Basilio Cairo  Biomarkers:  MGMT Unknown.  IDH 1/2 Unknown.  EGFR Unknown  TERT Unknown   Interval History: Beth [name] presents today for follow up after recent MRI brain.  She continues to have left sided visual impairment as prior.  Otherwise no new or progressive deficits.  Some short term memory issues. Denies headaches, seizures.  Did have some claustrophobia with the MRI study.  H+P (03/21/22) Patient presents today to review her pituitary adenoma.  She initially presented to medical attention "maybe 20 years ago" with galactorrhea, with apparently quite elevated prolactin levels.  She was treated with bromocriptine, per patient, without any CNS imaging performed.  This did lead to stabilization of symptoms.  This year, she underwent evaluation with optometrist which demonstrated bitemporal visual field deficit.  Referral was made for endocrinology, who performed CNS imaging, demonstrating very large sellar based mass.  She then underwent trans-sphenoidal resection in July 2023 with Dr. Maurice Small; tumor was debulked, pathology confirmed pituitary adenoma.  Surgery led to improvement in peripheral vision on the right side only.  Surgical site infection post-op was treated with Augmentin, now resolved.    Medications: Current Outpatient  Medications on File Prior to Visit  Medication Sig Dispense Refill   albuterol (VENTOLIN HFA) 108 (90 Base) MCG/ACT inhaler Inhale 2 puffs into the lungs every 6 (six) hours as needed for wheezing or shortness of breath. 8 g 6   Ascorbic Acid (VITAMIN C) 1000 MG tablet Take 1,000 mg by mouth daily.     azithromycin (ZITHROMAX) 250 MG tablet Take 1 tablet (250 mg total) by mouth daily. Take first 2 tablets together, then 1 every day until finished. 6 tablet 0   baclofen (LIORESAL) 10 MG tablet Take 0.5-1 tablets (5-10 mg total) by mouth 3 (three) times daily as needed for muscle spasms. 30 each 3   benzonatate (TESSALON) 200 MG capsule Take 1 capsule (200 mg total) by mouth 3 (three) times daily as needed for cough. 30 capsule 3   Butenafine HCl (MENTAX) 1 % cream Apply 1 application topically 2 (two) times daily. 24 g 1   Cholecalciferol (VITAMIN D3 PO) Take 2,000 mg by mouth daily.     Docusate Sodium (COLACE PO) Take 1 capsule by mouth daily.     EPINEPHrine 0.3 mg/0.3 mL IJ SOAJ injection Inject 0.3 mg into the muscle as needed for anaphylaxis.     fluticasone (FLONASE) 50 MCG/ACT nasal spray Place 2 sprays into both nostrils daily. 16 g 3   HYDROcodone-acetaminophen (NORCO/VICODIN) 5-325 MG tablet Take 1 tablet by mouth every 4 (four) hours as needed (pain). 30 tablet 0   levonorgestrel (MIRENA) 20 MCG/24HR IUD 1 each by Intrauterine route once.      LORazepam (ATIVAN) 1 MG tablet Take 1 tablet (1 mg total) by mouth every 8 (eight) hours as needed for anxiety (mri). 3 tablet 0   Menthol, Topical Analgesic, (  BIOFREEZE) 10 % CREA Apply 1 Application topically daily as needed (pain).     ondansetron (ZOFRAN-ODT) 8 MG disintegrating tablet Take 1 tablet (8 mg total) by mouth every 8 (eight) hours as needed for nausea or vomiting. 20 tablet 1   Pancrelipase, Lip-Prot-Amyl, (CREON) 24000-76000 units CPEP Take 2 capsules by mouth See admin instructions. Take 2 capsules three times daily with food and 1  capsule twice daily with snacks.     pantoprazole (PROTONIX) 20 MG tablet Take 20 mg by mouth daily.     promethazine-dextromethorphan (PROMETHAZINE-DM) 6.25-15 MG/5ML syrup Take 5 mLs by mouth 4 (four) times daily as needed for cough. 118 mL 0   psyllium (REGULOID) 0.52 g capsule Take 0.52 capsules by mouth daily.     Zinc Sulfate (ZINC 15 PO) Take 15 mg by mouth daily.     No current facility-administered medications on file prior to visit.    Allergies:  Allergies  Allergen Reactions   Bahia Hives, Shortness Of Breath and Swelling    Found through allergy testing, not sure which item caused the shortness of breath   [place] Grass Hives, Shortness Of Breath and Swelling    Found through allergy testing, not sure which item caused the shortness of breath   [employer], Shortness Of Breath and Swelling    Found through allergy testing, not sure which item caused the shortness of breath   Cladosporium Cladosporioides Hives, Shortness Of Breath and Swelling    Found through allergy testing, not sure which item caused the shortness of breath   Dust Mite Extract Hives, Shortness Of Breath and Swelling    Found through allergy testing, not sure which item caused the shortness of breath   Elm Bark [Ulmus Fulva] Hives, Shortness Of Breath and Swelling    Found through allergy testing, not sure which item caused the shortness of breath   Molds & Smuts Hives, Shortness Of Breath and Swelling    Found through allergy testing, not sure which item caused the shortness of breath   Mugwort Hives, Shortness Of Breath and Swelling    Found through allergy testing, not sure which item caused the shortness of breath   Nettle [Nettle (Urtica Dioica)] Hives, Shortness Of Breath and Swelling   Red Mulberry Allergy Skin Test Hives, Shortness Of Breath and Swelling    Found through allergy testing, not sure which item caused the shortness of breath   Sorrel-Dock Mix [Sheep Sorrel-Yellow Dock] Hives,  Shortness Of Breath and Swelling    Found through allergy testing, not sure which item caused the shortness of breath   Timothy Grass Pollen Allergen Hives, Shortness Of Breath and Swelling    Found through allergy testing, not sure which item caused the shortness of breath   American Cockroach    Mixed Ragweed    Past Medical History:  Past Medical History:  Diagnosis Date   Asthma    Chronic pancreatitis (HCC)    DDD (degenerative disc disease), lumbosacral    DJD (degenerative joint disease)    Headache    Hyperlipidemia    Hypertension    Hypoglycemia    Obesity    Osteoarthritis    Prolactinoma (HCC)    Sciatica    Sleep apnea    Past Surgical History:  Past Surgical History:  Procedure Laterality Date   CHOLECYSTECTOMY     COLONOSCOPY     CRANIOTOMY N/A 01/04/2022   Procedure: Endonasal endoscopic resection of pituitary tumor;  Surgeon: Jadene Pierini,  MD;  Location: [facility] OR;  Service: Neurosurgery;  Laterality: N/A;  RM 20   NASAL SINUS SURGERY N/A 01/04/2022   Procedure: ENDOSCOPIC SINUS SURGERY TRANSNASAL PITUITARY REMOVAL WITH NASAL NASOSETTAL FLAP;  Surgeon: Laren Boom, DO;  Location: [facility] OR;  Service: ENT;  Laterality: N/A;   Social History:  Social History   Socioeconomic History   Marital status: Divorced    Spouse name: Not on file   Number of children: 3   Years of education: Not on file   Highest education level: Not on file  Occupational History   Not on file  Tobacco Use   Smoking status: Never    Passive exposure: Never   Smokeless tobacco: Never  Vaping Use   Vaping status: Never Used  Substance and Sexual Activity   Alcohol use: Never   Drug use: Never   Sexual activity: Yes  Other Topics Concern   Not on file  Social History Narrative   Not on file   Social Drivers of Health   Financial Resource Strain: Not on file  Food Insecurity: Not on file  Transportation Needs: Not on file  Physical Activity: Not on file  Stress:  Not on file  Social Connections: Not on file  Intimate Partner Violence: Not on file   Family History:  Family History  Problem Relation Age of Onset   Hypertension Mother    Diabetes Mother    Colon cancer Mother    Arthritis Mother    Thyroid disease Mother    Asthma Mother    Osteoporosis Mother    Diabetes Father    Hypertension Father    Bell's palsy Father    Hypertension Sister    Heart murmur Sister    Lupus Sister    Hypertension Sister    Polycystic ovary syndrome Sister    Scoliosis Sister    Hypertension Brother    Heart failure Brother    Cancer Brother    Other Paternal Aunt        pituitary surgery    Review of Systems: Constitutional: Doesn't report fevers, chills or abnormal weight loss Eyes: Doesn't report blurriness of vision Ears, nose, mouth, throat, and face: Doesn't report sore throat Respiratory: Doesn't report cough, dyspnea or wheezes Cardiovascular: Doesn't report palpitation, chest discomfort  Gastrointestinal:  Doesn't report nausea, constipation, diarrhea GU: Doesn't report incontinence Skin: Doesn't report skin rashes Neurological: Per HPI Musculoskeletal: Doesn't report joint pain Behavioral/Psych: Doesn't report anxiety  Physical Exam: Vitals:   06/14/23 1000  BP: 124/71  Pulse: (!) 106  Resp: 18  Temp: (!) 97.2 F (36.2 C)  SpO2: 100%    KPS: 90. General: Alert, cooperative, pleasant, in no acute distress Head: Normal EENT: No conjunctival injection or scleral icterus.  Lungs: Resp effort normal Cardiac: Regular rate Abdomen: Non-distended abdomen Skin: No rashes cyanosis or petechiae. Extremities: No clubbing or edema  Neurologic Exam: Mental Status: Awake, alert, attentive to examiner. Oriented to self and environment. Language is fluent with intact comprehension.  Cranial Nerves: Visual acuity is grossly normal. Left temporal field hemianopia. Extra-ocular movements intact. No ptosis. Face is symmetric Motor: Tone  and bulk are normal. Power is full in both arms and legs. Reflexes are symmetric, no pathologic reflexes present.  Sensory: Intact to light touch Gait: Normal.   Labs: I have reviewed the data as listed    Component Value Date/Time   NA 144 06/16/2022 1048   K 4.4 06/16/2022 1048   CL 105 06/16/2022 1048  CO2 25 06/16/2022 1048   GLUCOSE 95 06/16/2022 1048   GLUCOSE 105 (H) 01/13/2022 0500   BUN 8 06/16/2022 1048   CREATININE 1.01 (H) 06/16/2022 1048   CREATININE 0.86 05/22/2022 1606   CREATININE 0.86 10/21/2021 1610   CALCIUM 9.4 06/16/2022 1048   PROT 7.0 06/16/2022 1048   ALBUMIN 4.4 06/16/2022 1048   AST 18 06/16/2022 1048   ALT 13 06/16/2022 1048   ALKPHOS 104 06/16/2022 1048   BILITOT 0.4 06/16/2022 1048   GFRNONAA >60 05/22/2022 1606   GFRAA 98 01/02/2020 0955   Lab Results  Component Value Date   WBC 5.2 06/16/2022   NEUTROABS 2.0 06/16/2022   HGB 12.8 06/16/2022   HCT 39.7 06/16/2022   MCV 83 06/16/2022   PLT 311 06/16/2022   Imaging:  [facility] Clinician Interpretation: I have personally reviewed the CNS images as listed.  My interpretation, in the context of the patient's clinical presentation, is stable disease  MR BRAIN W WO CONTRAST Result Date: 06/07/2023 CLINICAL DATA:  Brain/CNS neoplasm, assess treatment response pituitary protocol EXAM: MRI HEAD WITHOUT AND WITH CONTRAST TECHNIQUE: Multiplanar, multiecho pulse sequences of the brain and surrounding structures were obtained without and with intravenous contrast. CONTRAST:  10 ml Vueway COMPARISON:  Brain MR 12/14/22 FINDINGS: Brain: Negative for an acute infarct. No hemorrhage. No hydrocephalus. No extra-axial fluid collection. Postsurgical changes from transsphenoidal resection of pituitary tumor. Compared to prior exam there is interval decrease in size of the residual tumor along the right aspect of the sella. Residual tumor now measures up to 9 x 8 mm, previously 11 x 9 mm (when measured in a similar  orientation) with slightly decreased invasion of the right cavernous sinus. No new contrast-enhancing lesions visualized. Vascular: Normal flow voids. Skull and upper cervical spine: Normal marrow signal. Sinuses/Orbits: No middle ear or mastoid effusion. Paranasal sinuses are notable for postsurgical changes from transsphenoidal resection of pituitary tumor. Orbits are unremarkable. Other: None. IMPRESSION: Postsurgical changes from transsphenoidal resection of pituitary tumor. Interval decrease in size of the residual tumor along the right aspect of the sella with slightly decreased invasion of the right cavernous sinus. Electronically Signed   By: Lorenza Cambridge M.D.   On: 06/07/2023 15:40   DG Chest 2 View Result Date: 05/23/2023 CLINICAL DATA:  Worsening cough and chest congestion for 3 weeks. EXAM: CHEST - 2 VIEW COMPARISON:  06/13/2022 FINDINGS: The heart size and mediastinal contours are within normal limits. Both lungs are clear. The visualized skeletal structures are unremarkable. IMPRESSION: No active cardiopulmonary disease. Electronically Signed   By: Danae Orleans M.D.   On: 05/23/2023 17:53    Assessment/Plan Prolactinoma (HCC)  Beth [name] is clinically stable today.  MRI brain demonstrates stable findings, maybe some contraction in overall enhancing volume.  We discussed downstream cognitive and memory effects of prior radiation exposure.  Counseled on lifestyle interventions to maintain good cognition.    She will continue to follow with endocrinology as well.  We ask that Beth [name] return to clinic in 12 months following next brain MRI, or sooner as needed.  All questions were answered. The patient knows to call the clinic with any problems, questions or concerns. No barriers to learning were detected.  The total time spent in the encounter was 40 minutes and more than 50% was on counseling and review of test results   Henreitta Leber, MD Medical Director of  Neuro-Oncology [facility] at [facility] 06/14/23 10:08 AM

## 2023-06-25 ENCOUNTER — Telehealth: Payer: Self-pay | Admitting: Internal Medicine

## 2023-06-25 NOTE — Telephone Encounter (Signed)
Patient called to confirm scheduled appts.

## 2023-06-28 ENCOUNTER — Telehealth (INDEPENDENT_AMBULATORY_CARE_PROVIDER_SITE_OTHER): Payer: Self-pay | Admitting: Primary Care

## 2023-06-28 NOTE — Telephone Encounter (Signed)
 Called to confirm apt. Pt will be present.

## 2023-06-29 ENCOUNTER — Ambulatory Visit (INDEPENDENT_AMBULATORY_CARE_PROVIDER_SITE_OTHER): Payer: BC Managed Care – PPO | Admitting: Primary Care

## 2023-06-29 VITALS — BP 126/84 | HR 68 | Resp 16 | Ht 69.0 in | Wt 285.8 lb

## 2023-06-29 DIAGNOSIS — Z2821 Immunization not carried out because of patient refusal: Secondary | ICD-10-CM | POA: Diagnosis not present

## 2023-06-29 DIAGNOSIS — E785 Hyperlipidemia, unspecified: Secondary | ICD-10-CM

## 2023-06-29 DIAGNOSIS — Z5941 Food insecurity: Secondary | ICD-10-CM | POA: Diagnosis not present

## 2023-06-30 LAB — CBC WITH DIFFERENTIAL/PLATELET
Basophils Absolute: 0 10*3/uL (ref 0.0–0.2)
Basos: 1 %
EOS (ABSOLUTE): 0.2 10*3/uL (ref 0.0–0.4)
Eos: 3 %
Hematocrit: 38.7 % (ref 34.0–46.6)
Hemoglobin: 12.7 g/dL (ref 11.1–15.9)
Immature Grans (Abs): 0 10*3/uL (ref 0.0–0.1)
Immature Granulocytes: 0 %
Lymphocytes Absolute: 2.7 10*3/uL (ref 0.7–3.1)
Lymphs: 54 %
MCH: 28 pg (ref 26.6–33.0)
MCHC: 32.8 g/dL (ref 31.5–35.7)
MCV: 85 fL (ref 79–97)
Monocytes Absolute: 0.5 10*3/uL (ref 0.1–0.9)
Monocytes: 9 %
Neutrophils Absolute: 1.6 10*3/uL (ref 1.4–7.0)
Neutrophils: 33 %
Platelets: 281 10*3/uL (ref 150–450)
RBC: 4.54 x10E6/uL (ref 3.77–5.28)
RDW: 14.7 % (ref 11.7–15.4)
WBC: 4.9 10*3/uL (ref 3.4–10.8)

## 2023-06-30 LAB — CMP14+EGFR
ALT: 16 [IU]/L (ref 0–32)
AST: 16 [IU]/L (ref 0–40)
Albumin: 4.3 g/dL (ref 3.9–4.9)
Alkaline Phosphatase: 92 [IU]/L (ref 44–121)
BUN/Creatinine Ratio: 15 (ref 9–23)
BUN: 13 mg/dL (ref 6–24)
Bilirubin Total: <0.2 mg/dL (ref 0.0–1.2)
CO2: 26 mmol/L (ref 20–29)
Calcium: 9.4 mg/dL (ref 8.7–10.2)
Chloride: 103 mmol/L (ref 96–106)
Creatinine, Ser: 0.86 mg/dL (ref 0.57–1.00)
Globulin, Total: 2.6 g/dL (ref 1.5–4.5)
Glucose: 83 mg/dL (ref 70–99)
Potassium: 4.4 mmol/L (ref 3.5–5.2)
Sodium: 142 mmol/L (ref 134–144)
Total Protein: 6.9 g/dL (ref 6.0–8.5)
eGFR: 84 mL/min/{1.73_m2} (ref >59)

## 2023-06-30 LAB — VITAMIN D 25 HYDROXY (VIT D DEFICIENCY, FRACTURES): Vit D, 25-Hydroxy: 22.2 ng/mL — ABNORMAL LOW (ref 30.0–100.0)

## 2023-06-30 LAB — LIPID PANEL
Chol/HDL Ratio: 6.3 {ratio} — ABNORMAL HIGH (ref 0.0–4.4)
Cholesterol, Total: 250 mg/dL — ABNORMAL HIGH (ref 100–199)
HDL: 40 mg/dL (ref >39)
LDL Chol Calc (NIH): 187 mg/dL — ABNORMAL HIGH (ref 0–99)
Triglycerides: 128 mg/dL (ref 0–149)
VLDL Cholesterol Cal: 23 mg/dL (ref 5–40)

## 2023-07-03 ENCOUNTER — Other Ambulatory Visit (INDEPENDENT_AMBULATORY_CARE_PROVIDER_SITE_OTHER): Payer: Self-pay | Admitting: Primary Care

## 2023-07-03 DIAGNOSIS — E782 Mixed hyperlipidemia: Secondary | ICD-10-CM

## 2023-07-03 MED ORDER — ROSUVASTATIN CALCIUM 40 MG PO TABS: 40.0000 mg | ORAL_TABLET | Freq: Every day | ORAL | 3 refills | Status: AC

## 2023-07-04 ENCOUNTER — Encounter (INDEPENDENT_AMBULATORY_CARE_PROVIDER_SITE_OTHER): Payer: Self-pay | Admitting: Primary Care

## 2023-07-04 ENCOUNTER — Telehealth: Payer: Self-pay | Admitting: *Deleted

## 2023-07-04 DIAGNOSIS — E559 Vitamin D deficiency, unspecified: Secondary | ICD-10-CM

## 2023-07-04 NOTE — Progress Notes (Signed)
 Complex Care Management Note Care Guide Note  07/04/2023 Name: Beth [name] MRN: [id number] DOB: 11/18/1975   Complex Care Management Outreach Attempts: An unsuccessful telephone outreach was attempted today to offer the patient information about available complex care management services.  Follow Up Plan:  Additional outreach attempts will be made to offer the patient complex care management information and services.   Encounter Outcome:  No Answer  Harlene Satterfield  Care Coordination Care Guide  Direct Dial: [phone]

## 2023-07-05 ENCOUNTER — Ambulatory Visit: Payer: Self-pay | Admitting: Cardiology

## 2023-07-05 MED ORDER — ERGOCALCIFEROL 1.25 MG (50000 UT) PO CAPS: 50000.0000 [IU] | ORAL_CAPSULE | ORAL | 0 refills | Status: DC

## 2023-07-07 NOTE — Progress Notes (Signed)
 [facility]  Beth [name] is a 48 y.o. female presents to office today for annual physical exam examination.   She recently seen oncologist for pituitary adenoma. Continues to have left-sided visual impairment, short-term memory loss.  Today she is very happy voices no complaints or concerns. Defiantly at a good place with all the  Medical issues and tx encountered - good spirit  Concerns today include: 1. None - fasting labs    Health Maintenance  Topic Date Due   Pneumococcal Vaccine 73-44 Years old (1 of 2 - PCV) Never done   COVID-19 Vaccine (4 - 2024-25 season) 02/25/2023   INFLUENZA VACCINE  09/24/2023 (Originally 01/25/2023)   Cervical Cancer Screening (HPV/Pap Cotest)  08/20/2025   Colonoscopy  06/24/2029   Hepatitis C Screening  Completed   HIV Screening  Completed   HPV VACCINES  Aged Out   DTaP/Tdap/Td  Discontinued     Past Medical History:  Diagnosis Date   Asthma    Chronic pancreatitis (HCC)    DDD (degenerative disc disease), lumbosacral    DJD (degenerative joint disease)    Headache    Hyperlipidemia    Hypertension    Hypoglycemia    Obesity    Osteoarthritis    Prolactinoma (HCC)    Sciatica    Sleep apnea    Social History   Socioeconomic History   Marital status: Divorced    Spouse name: Not on file   Number of children: 3   Years of education: Not on file   Highest education level: Some college, no degree  Occupational History   Not on file  Tobacco Use   Smoking status: Never    Passive exposure: Never   Smokeless tobacco: Never  Vaping Use   Vaping status: Never Used  Substance and Sexual Activity   Alcohol use: Never   Drug use: Never   Sexual activity: Yes  Other Topics Concern   Not on file  Social History Narrative   Not on file   Social Drivers of Health   Financial Resource Strain: High Risk (06/29/2023)   Overall Financial Resource Strain (CARDIA)    Difficulty of Paying Living Expenses: Hard  Food  Insecurity: Food Insecurity Present (06/29/2023)   Hunger Vital Sign    Worried About Running Out of Food in the Last Year: Sometimes true    Ran Out of Food in the Last Year: Never true  Transportation Needs: No Transportation Needs (06/29/2023)   PRAPARE - Administrator, Civil Service (Medical): No    Lack of Transportation (Non-Medical): No  Physical Activity: Unknown (06/29/2023)   Exercise Vital Sign    Days of Exercise per Week: 0 days    Minutes of Exercise per Session: Not on file  Stress: Stress Concern Present (06/29/2023)   [employer] of Occupational Health - Occupational Stress Questionnaire    Feeling of Stress : To some extent  Social Connections: Unknown (06/29/2023)   Social Connection and Isolation Panel [NHANES]    Frequency of Communication with Friends and Family: More than three times a week    Frequency of Social Gatherings with Friends and Family: Patient declined    Attends Religious Services: Patient declined    Database Administrator or Organizations: Yes    Attends Engineer, Structural: More than 4 times per year    Marital Status: Divorced  Intimate Partner Violence: Not At Risk (06/29/2023)   Humiliation, Afraid, Rape, and Kick questionnaire  Fear of Current or Ex-Partner: No    Emotionally Abused: No    Physically Abused: No    Sexually Abused: No   Past Surgical History:  Procedure Laterality Date   CHOLECYSTECTOMY     COLONOSCOPY     CRANIOTOMY N/A 01/04/2022   Procedure: Endonasal endoscopic resection of pituitary tumor;  Surgeon: Cheryle Debby LABOR, MD;  Location: [facility] OR;  Service: Neurosurgery;  Laterality: N/A;  RM 20   NASAL SINUS SURGERY N/A 01/04/2022   Procedure: ENDOSCOPIC SINUS SURGERY TRANSNASAL PITUITARY REMOVAL WITH NASAL NASOSETTAL FLAP;  Surgeon: Skotnicki, Meghan A, DO;  Location: [facility] OR;  Service: ENT;  Laterality: N/A;   Family History  Problem Relation Age of Onset   Hypertension Mother    Diabetes Mother     Colon cancer Mother    Arthritis Mother    Thyroid  disease Mother    Asthma Mother    Osteoporosis Mother    Diabetes Father    Hypertension Father    Bell's palsy Father    Hypertension Sister    Heart murmur Sister    Lupus Sister    Hypertension Sister    Polycystic ovary syndrome Sister    Scoliosis Sister    Hypertension Brother    Heart failure Brother    Cancer Brother    Other Paternal Aunt        pituitary surgery    Current Outpatient Medications:    albuterol  (VENTOLIN  HFA) 108 (90 Base) MCG/ACT inhaler, Inhale 2 puffs into the lungs every 6 (six) hours as needed for wheezing or shortness of breath., Disp: 8 g, Rfl: 6   Ascorbic Acid  (VITAMIN C) 1000 MG tablet, Take 1,000 mg by mouth daily., Disp: , Rfl:    benzonatate  (TESSALON ) 200 MG capsule, Take 1 capsule (200 mg total) by mouth 3 (three) times daily as needed for cough., Disp: 30 capsule, Rfl: 3   Butenafine  HCl (MENTAX) 1 % cream, Apply 1 application topically 2 (two) times daily., Disp: 24 g, Rfl: 1   Cholecalciferol (VITAMIN D3 PO), Take 2,000 mg by mouth daily., Disp: , Rfl:    Docusate Sodium  (COLACE PO), Take 1 capsule by mouth daily., Disp: , Rfl:    EPINEPHrine  0.3 mg/0.3 mL IJ SOAJ injection, Inject 0.3 mg into the muscle as needed for anaphylaxis., Disp: , Rfl:    ergocalciferol  (VITAMIN D2) 1.25 MG (50000 UT) capsule, Take 1 capsule (50,000 Units total) by mouth once a week., Disp: 8 capsule, Rfl: 0   fluticasone  (FLONASE ) 50 MCG/ACT nasal spray, Place 2 sprays into both nostrils daily., Disp: 16 g, Rfl: 3   levonorgestrel  (MIRENA ) 20 MCG/24HR IUD, 1 each by Intrauterine route once. , Disp: , Rfl:    LORazepam  (ATIVAN ) 1 MG tablet, Take 1 tablet (1 mg total) by mouth every 8 (eight) hours as needed for anxiety (mri)., Disp: 3 tablet, Rfl: 0   Menthol, Topical Analgesic, (BIOFREEZE) 10 % CREA, Apply 1 Application topically daily as needed (pain)., Disp: , Rfl:    ondansetron  (ZOFRAN -ODT) 8 MG  disintegrating tablet, Take 1 tablet (8 mg total) by mouth every 8 (eight) hours as needed for nausea or vomiting., Disp: 20 tablet, Rfl: 1   Pancrelipase , Lip-Prot-Amyl, (CREON ) 24000-76000 units CPEP, Take 2 capsules by mouth See admin instructions. Take 2 capsules three times daily with food and 1 capsule twice daily with snacks., Disp: , Rfl:    pantoprazole  (PROTONIX ) 20 MG tablet, Take 20 mg by mouth daily., Disp: , Rfl:  promethazine -dextromethorphan  (PROMETHAZINE -DM) 6.25-15 MG/5ML syrup, Take 5 mLs by mouth 4 (four) times daily as needed for cough., Disp: 118 mL, Rfl: 0   psyllium (REGULOID) 0.52 g capsule, Take 0.52 capsules by mouth daily., Disp: , Rfl:    rosuvastatin  (CRESTOR ) 40 MG tablet, Take 1 tablet (40 mg total) by mouth daily., Disp: 90 tablet, Rfl: 3   Zinc  Sulfate (ZINC  15 PO), Take 15 mg by mouth daily., Disp: , Rfl:  Outpatient Encounter Medications as of 06/29/2023  Medication Sig Note   albuterol  (VENTOLIN  HFA) 108 (90 Base) MCG/ACT inhaler Inhale 2 puffs into the lungs every 6 (six) hours as needed for wheezing or shortness of breath.    Ascorbic Acid  (VITAMIN C) 1000 MG tablet Take 1,000 mg by mouth daily.    benzonatate  (TESSALON ) 200 MG capsule Take 1 capsule (200 mg total) by mouth 3 (three) times daily as needed for cough.    Butenafine  HCl (MENTAX) 1 % cream Apply 1 application topically 2 (two) times daily.    Cholecalciferol (VITAMIN D3 PO) Take 2,000 mg by mouth daily.    Docusate Sodium  (COLACE PO) Take 1 capsule by mouth daily.    EPINEPHrine  0.3 mg/0.3 mL IJ SOAJ injection Inject 0.3 mg into the muscle as needed for anaphylaxis. 12/26/2021: Pt needs a refill, current pen is expired   fluticasone  (FLONASE ) 50 MCG/ACT nasal spray Place 2 sprays into both nostrils daily.    levonorgestrel  (MIRENA ) 20 MCG/24HR IUD 1 each by Intrauterine route once.     LORazepam  (ATIVAN ) 1 MG tablet Take 1 tablet (1 mg total) by mouth every 8 (eight) hours as needed for anxiety  (mri).    Menthol, Topical Analgesic, (BIOFREEZE) 10 % CREA Apply 1 Application topically daily as needed (pain).    ondansetron  (ZOFRAN -ODT) 8 MG disintegrating tablet Take 1 tablet (8 mg total) by mouth every 8 (eight) hours as needed for nausea or vomiting.    Pancrelipase , Lip-Prot-Amyl, (CREON ) 24000-76000 units CPEP Take 2 capsules by mouth See admin instructions. Take 2 capsules three times daily with food and 1 capsule twice daily with snacks.    pantoprazole  (PROTONIX ) 20 MG tablet Take 20 mg by mouth daily.    promethazine -dextromethorphan  (PROMETHAZINE -DM) 6.25-15 MG/5ML syrup Take 5 mLs by mouth 4 (four) times daily as needed for cough.    psyllium (REGULOID) 0.52 g capsule Take 0.52 capsules by mouth daily.    Zinc  Sulfate (ZINC  15 PO) Take 15 mg by mouth daily.    [DISCONTINUED] azithromycin  (ZITHROMAX ) 250 MG tablet Take 1 tablet (250 mg total) by mouth daily. Take first 2 tablets together, then 1 every day until finished.    [DISCONTINUED] baclofen  (LIORESAL ) 10 MG tablet Take 0.5-1 tablets (5-10 mg total) by mouth 3 (three) times daily as needed for muscle spasms.    [DISCONTINUED] HYDROcodone -acetaminophen  (NORCO/VICODIN) 5-325 MG tablet Take 1 tablet by mouth every 4 (four) hours as needed (pain).    No facility-administered encounter medications on file as of 06/29/2023.    Allergies  Allergen Reactions   Bahia Hives, Shortness Of Breath and Swelling    Found through allergy testing, not sure which item caused the shortness of breath   Bermuda Grass Hives, Shortness Of Breath and Swelling    Found through allergy testing, not sure which item caused the shortness of breath   [employer], Shortness Of Breath and Swelling    Found through allergy testing, not sure which item caused the shortness of breath   Cladosporium Cladosporioides Hives,  Shortness Of Breath and Swelling    Found through allergy testing, not sure which item caused the shortness of breath   Dust Mite  Extract Hives, Shortness Of Breath and Swelling    Found through allergy testing, not sure which item caused the shortness of breath   Elm Bark [Ulmus Fulva] Hives, Shortness Of Breath and Swelling    Found through allergy testing, not sure which item caused the shortness of breath   Molds & Smuts Hives, Shortness Of Breath and Swelling    Found through allergy testing, not sure which item caused the shortness of breath   Mugwort Hives, Shortness Of Breath and Swelling    Found through allergy testing, not sure which item caused the shortness of breath   Nettle [Nettle (Urtica Dioica)] Hives, Shortness Of Breath and Swelling   Red Mulberry Allergy Skin Test Hives, Shortness Of Breath and Swelling    Found through allergy testing, not sure which item caused the shortness of breath   Sorrel-Dock Mix [Sheep Sorrel-Yellow Dock] Hives, Shortness Of Breath and Swelling    Found through allergy testing, not sure which item caused the shortness of breath   Timothy Grass Pollen Allergen Hives, Shortness Of Breath and Swelling    Found through allergy testing, not sure which item caused the shortness of breath   American Cockroach    Mixed Ragweed      ROS: Review of Systems Pertinent items noted in HPI and remainder of comprehensive ROS otherwise negative.    Physical exam General: No apparent distress. Eyes: Extraocular eye movements intact, pupils equal and round. Neck: Supple, trachea midline. Thyroid : No enlargement, mobile without fixation, no tenderness. Cardiovascular: Regular rhythm and rate, no murmur, normal radial pulses. Respiratory: Normal respiratory effort, clear to auscultation. Gastrointestinal: Normal pitch active bowel sounds, nontender abdomen without distention or appreciable hepatomegaly. Neurologic:  Musculoskeletal: Normal muscle tone, no tenderness on palpation of tibia, no excessive thoracic kyphosis. Skin: Appropriate warmth, no visible rash. Mental status: Alert,  conversant, speech clear, thought logical, appropriate mood and affect, no hallucinations or delusions evident. Hematologic/lymphatic: No cervical adenopathy, no visible ecchymoses.    Assessment/ Plan: Sanjuanita Essex here for annual physical exam.  CBC with Differential/Platelet -     CMP14+EGFR -     VITAMIN D  25 Hydroxy (Vit-D Deficiency, Fractures) -     Lipid panel  Influenza vaccination declined   AMB Referral [facility] Management  Food insecurity -     AMB Referral [facility] Care Management  Dyslipidemia  Stopped statin due to normalized after radiation on pituitary adenoma Lipid panel  Counseled on healthy lifestyle choices, including diet (rich in fruits, vegetables and lean meats and low in salt and simple carbohydrates) and exercise (at least 30 minutes of moderate physical activity daily).  Patient to follow up in 1 year for annual exam or sooner if needed.  The above assessment and management plan was discussed with the patient. The patient verbalized understanding of and has agreed to the management plan. Patient is aware to call the clinic if symptoms persist or worsen. Patient is aware when to return to the clinic for a follow-up visit. Patient educated on when it is appropriate to go to the emergency department.   This note has been created with Education officer, environmental. Any transcriptional errors are unintentional.   Rosaline SHAUNNA Bohr, NP 07/07/2023, 3:58 PM

## 2023-07-10 ENCOUNTER — Encounter: Payer: Self-pay | Admitting: Cardiology

## 2023-07-10 ENCOUNTER — Ambulatory Visit: Payer: BC Managed Care – PPO | Attending: Cardiology | Admitting: Cardiology

## 2023-07-10 VITALS — BP 128/88 | HR 74 | Resp 16 | Ht 69.0 in | Wt 282.2 lb

## 2023-07-10 DIAGNOSIS — E66813 Obesity, class 3: Secondary | ICD-10-CM | POA: Diagnosis not present

## 2023-07-10 DIAGNOSIS — I10 Essential (primary) hypertension: Secondary | ICD-10-CM

## 2023-07-10 DIAGNOSIS — Z6841 Body Mass Index (BMI) 40.0 and over, adult: Secondary | ICD-10-CM

## 2023-07-10 DIAGNOSIS — I479 Paroxysmal tachycardia, unspecified: Secondary | ICD-10-CM | POA: Diagnosis not present

## 2023-07-10 DIAGNOSIS — E782 Mixed hyperlipidemia: Secondary | ICD-10-CM

## 2023-07-10 NOTE — Patient Instructions (Signed)
Medication Instructions:   *If you need a refill on your cardiac medications before your next appointment, please call your pharmacy*   Lab Work:  If you have labs (blood work) drawn today and your tests are completely normal, you will receive your results only by: MyChart Message (if you have MyChart) OR A paper copy in the mail If you have any lab test that is abnormal or we need to change your treatment, we will call you to review the results.   Testing/Procedures:    Follow-Up: At [facility], you and your health needs are our priority.  As part of our continuing mission to provide you with exceptional heart care, we have created designated Provider Care Teams.  These Care Teams include your primary Cardiologist (physician) and Advanced Practice Providers (APPs -  Physician Assistants and Nurse Practitioners) who all work together to provide you with the care you need, when you need it.  We recommend signing up for the patient portal called "MyChart".  Sign up information is provided on this After Visit Summary.  MyChart is used to connect with patients for Virtual Visits (Telemedicine).  Patients are able to view lab/test results, encounter notes, upcoming appointments, etc.  Non-urgent messages can be sent to your provider as well.   To learn more about what you can do with MyChart, go to [email or web address].    Your next appointment:  as needed

## 2023-07-10 NOTE — Progress Notes (Signed)
 Cardiology Office Note:  .   Date:  07/10/2023  ID:  Beth [name], DOB 10-May-1976, MRN [id number] PCP:  Celestia Rosaline SQUIBB, NP  Former Cardiology Providers: None [facility] Providers Cardiologist:  None , [facility] (established care 02/19/2020) Electrophysiologist:  None  Click to update primary MD,subspecialty MD or APP then REFRESH:1}    Chief Complaint  Patient presents with   Follow-up    Annual follow-up paroxysmal tachycardia    History of Present Illness: .   Beth [name] is a 48 y.o. African-American female whose past medical history and cardiovascular risk factors includes: Sleep apnea on CPAP history of COVID-19 infection, hyperlipidemia, hypertension, obesity due to excess calories.   Patient was referred to the practice for evaluation of tachycardia with effort related activities back in 2021 after a prolonged COVID infection/sequelae which led to hypoxia requiring nasal cannula oxygen  and the need for wheelchair assistance with prolonged ambulation.   Since then she has underwent appropriate cardiovascular testing as outlined below.  Since then she has done remarkably well from general health standpoint.  She is no longer on oxygen  therapy or ambulating with a wheelchair.  She presents today for 1 year follow-up visit.  Overall doing well from a cardiovascular standpoint.  Denies anginal chest pain or heart failure symptoms.  She still ambulates with a cane but makes an effort to walk at least 2000 steps per day.  Recent labs in January 2025 elevated cholesterol levels and she is working with PCP.  Started back on statin therapy.  Review of Systems: .   Review of Systems  Cardiovascular:  Negative for chest pain, claudication, irregular heartbeat, leg swelling, near-syncope, orthopnea, palpitations, paroxysmal nocturnal dyspnea and syncope.  Respiratory:  Negative for shortness of breath.   Hematologic/Lymphatic: Negative for bleeding problem.   Studies  Reviewed:   EKG: EKG Interpretation Date/Time:  Tuesday July 10 2023 08:23:10 EST Ventricular Rate:  76 PR Interval:  178 QRS Duration:  72 QT Interval:  366 QTC Calculation: 411 R Axis:   21  Text Interpretation: Normal sinus rhythm Low voltage QRS Cannot rule out Anterior infarct (cited on or before 15-Dec-2019) When compared with ECG of 15-Dec-2019 10:53, No significant change since last tracing Confirmed by Michele Richardson [phone]) on 07/10/2023 8:29:45 AM  Echocardiogram: 03/12/2020:  Left ventricle cavity is normal in size and wall thickness. Normal global wall motion. Normal LV systolic function with EF 68%. Normal diastolic filling pattern.  Mild (Grade I) mitral regurgitation.  Mild tricuspid regurgitation.  No evidence of pulmonary hypertension.   Stress Testing: Lexiscan  Sestamibi stress test 03/03/2020:  Lexiscan  nuclear stress test performed using 1-day protocol.  Normal myocardial perfusion. Stress LVEF 78%.  Low risk study.   Cardiac monitor: 7 day extended Holter monitor:  Dominant rhythm normal sinus.  Heart rate 52-139 bpm.  Average heart rate 83 bpm.  No atrial fibrillation, supraventricular tachycardia, ventricular tachycardia, high grade AV block, sinus pause greater than or equal to 3 seconds in duration.  Total ventricular ectopic burden <1%.  Total supraventricular ectopic burden <1%.  Patient triggered events: 1.  Underlying rhythm sinus tachycardia without dysrhythmias   RADIOLOGY: NA  Risk Assessment/Calculations:   NA   Labs:       Latest Ref Rng & Units 06/29/2023   10:47 AM 06/16/2022   10:48 AM 01/05/2022    2:48 AM  CBC  WBC 3.4 - 10.8 x10E3/uL 4.9  5.2  11.8   Hemoglobin 11.1 - 15.9 g/dL 87.2  12.8  11.4   Hematocrit 34.0 - 46.6 % 38.7  39.7  34.2   Platelets 150 - 450 x10E3/uL 281  311  306        Latest Ref Rng & Units 06/29/2023   10:47 AM 06/16/2022   10:48 AM 05/22/2022    4:06 PM  BMP  Glucose 70 - 99 mg/dL 83  95    BUN 6 -  24 mg/dL 13  8  9    Creatinine 0.57 - 1.00 mg/dL 9.13  8.98  9.13   BUN/Creat Ratio 9 - 23 15  8     Sodium 134 - 144 mmol/L 142  144    Potassium 3.5 - 5.2 mmol/L 4.4  4.4    Chloride 96 - 106 mmol/L 103  105    CO2 20 - 29 mmol/L 26  25    Calcium  8.7 - 10.2 mg/dL 9.4  9.4        Latest Ref Rng & Units 06/29/2023   10:47 AM 06/16/2022   10:48 AM 05/22/2022    4:06 PM  CMP  Glucose 70 - 99 mg/dL 83  95    BUN 6 - 24 mg/dL 13  8  9    Creatinine 0.57 - 1.00 mg/dL 9.13  8.98  9.13   Sodium 134 - 144 mmol/L 142  144    Potassium 3.5 - 5.2 mmol/L 4.4  4.4    Chloride 96 - 106 mmol/L 103  105    CO2 20 - 29 mmol/L 26  25    Calcium  8.7 - 10.2 mg/dL 9.4  9.4    Total Protein 6.0 - 8.5 g/dL 6.9  7.0    Total Bilirubin 0.0 - 1.2 mg/dL <9.7  0.4    Alkaline Phos 44 - 121 IU/L 92  104    AST 0 - 40 IU/L 16  18    ALT 0 - 32 IU/L 16  13      Lab Results  Component Value Date   CHOL 250 (H) 06/29/2023   HDL 40 06/29/2023   LDLCALC 187 (H) 06/29/2023   TRIG 128 06/29/2023   CHOLHDL 6.3 (H) 06/29/2023   No results for input(s): LIPOA in the last 8760 hours. No components found for: NTPROBNP No results for input(s): PROBNP in the last 8760 hours. Recent Labs    12/29/22 1109 04/20/23 0828  TSH 0.64 1.29   Physical Exam:    Today's Vitals   07/10/23 0818  BP: 128/88  Pulse: 74  Resp: 16  SpO2: 95%  Weight: 282 lb 3.2 oz (128 kg)  Height: 5' 9 (1.753 m)   Body mass index is 41.67 kg/m. Wt Readings from Last 3 Encounters:  07/10/23 282 lb 3.2 oz (128 kg)  06/29/23 285 lb 12.8 oz (129.6 kg)  06/14/23 274 lb 11.2 oz (124.6 kg)    Physical Exam  Constitutional: No distress.  Age appropriate, hemodynamically stable, walks w/ cane  Neck: No JVD present.  Cardiovascular: Normal rate, regular rhythm, S1 normal, S2 normal, intact distal pulses and normal pulses. Exam reveals no gallop, no S3 and no S4.  No murmur heard. Pulmonary/Chest: Effort normal and breath sounds  normal. No stridor. She has no wheezes. She has no rales.  Abdominal: Soft. Bowel sounds are normal. She exhibits no distension. There is no abdominal tenderness.  Musculoskeletal:        General: No edema.     Cervical back: Neck supple.  Neurological: She is alert and oriented to  person, place, and time. She has intact cranial nerves (2-12).  Skin: Skin is warm and moist.   Impression & Recommendation(s):  Impression:   ICD-10-CM   1. Tachycardia, paroxysmal (HCC)  I47.9 EKG 12-Lead    2. Mixed hyperlipidemia  E78.2     3. Benign hypertension  I10     4. Class 3 severe obesity due to excess calories with serious comorbidity and body mass index (BMI) of 40.0 to 44.9 in adult [facility])  Z33.186    E66.01    Z68.41        Recommendation(s):  Tachycardia, paroxysmal (HCC) No longer tachycardic - likely improved since her thyroid  function is controlled and dyspnea has improved.  EKG notes sinus rhythm without underlying ischemia injury pattern. No reoccurrence of consistent prolonged episodes of tachycardia since last office visit.  Mixed hyperlipidemia Most recent LDL 187 mg/dL as of January 7974. Restarted on rosuvastatin  40 mg p.o. daily Currently managed by primary care provider.  Benign hypertension Carries a history of benign essential hypertension. Office blood pressures are well-controlled. Currently not on pharmacological therapy, will defer long-term management to PCP. Reemphasized importance of low-salt diet.  Class 3 severe obesity due to excess calories with serious comorbidity and body mass index (BMI) of 40.0 to 44.9 in adult [facility]) Body mass index is 41.67 kg/m. I reviewed with her importance of diet, regular physical activity/exercise, weight loss.   Patient is educated on the importance of increasing physical activity gradually as tolerated with a goal of moderate intensity exercise for 30 minutes a day 5 days a week.  Orders Placed:  Orders Placed This  Encounter  Procedures   EKG 12-Lead   Final Medication List:   No orders of the defined types were placed in this encounter.   There are no discontinued medications.   Current Outpatient Medications:    albuterol  (VENTOLIN  HFA) 108 (90 Base) MCG/ACT inhaler, Inhale 2 puffs into the lungs every 6 (six) hours as needed for wheezing or shortness of breath., Disp: 8 g, Rfl: 6   Ascorbic Acid  (VITAMIN C) 1000 MG tablet, Take 1,000 mg by mouth daily., Disp: , Rfl:    benzonatate  (TESSALON ) 200 MG capsule, Take 1 capsule (200 mg total) by mouth 3 (three) times daily as needed for cough., Disp: 30 capsule, Rfl: 3   Butenafine  HCl (MENTAX) 1 % cream, Apply 1 application topically 2 (two) times daily. (Patient taking differently: Apply 1 application  topically as needed.), Disp: 24 g, Rfl: 1   Cholecalciferol (VITAMIN D3 PO), Take 2,000 mg by mouth daily. On Hold, Disp: , Rfl:    Docusate Sodium  (COLACE PO), Take 1 capsule by mouth daily., Disp: , Rfl:    EPINEPHrine  0.3 mg/0.3 mL IJ SOAJ injection, Inject 0.3 mg into the muscle as needed for anaphylaxis., Disp: , Rfl:    ergocalciferol  (VITAMIN D2) 1.25 MG (50000 UT) capsule, Take 1 capsule (50,000 Units total) by mouth once a week., Disp: 8 capsule, Rfl: 0   fluticasone  (FLONASE ) 50 MCG/ACT nasal spray, Place 2 sprays into both nostrils daily., Disp: 16 g, Rfl: 3   levonorgestrel  (MIRENA ) 20 MCG/24HR IUD, 1 each by Intrauterine route once. , Disp: , Rfl:    LORazepam  (ATIVAN ) 1 MG tablet, Take 1 tablet (1 mg total) by mouth every 8 (eight) hours as needed for anxiety (mri)., Disp: 3 tablet, Rfl: 0   Menthol, Topical Analgesic, (BIOFREEZE) 10 % CREA, Apply 1 Application topically daily as needed (pain)., Disp: , Rfl:  ondansetron  (ZOFRAN -ODT) 8 MG disintegrating tablet, Take 1 tablet (8 mg total) by mouth every 8 (eight) hours as needed for nausea or vomiting., Disp: 20 tablet, Rfl: 1   Pancrelipase , Lip-Prot-Amyl, (CREON ) 24000-76000 units CPEP,  Take 2 capsules by mouth See admin instructions. Take 2 capsules three times daily with food and 1 capsule twice daily with snacks., Disp: , Rfl:    pantoprazole  (PROTONIX ) 20 MG tablet, Take 20 mg by mouth daily., Disp: , Rfl:    promethazine -dextromethorphan  (PROMETHAZINE -DM) 6.25-15 MG/5ML syrup, Take 5 mLs by mouth 4 (four) times daily as needed for cough., Disp: 118 mL, Rfl: 0   psyllium (REGULOID) 0.52 g capsule, Take 0.52 capsules by mouth as needed., Disp: , Rfl:    rosuvastatin  (CRESTOR ) 40 MG tablet, Take 1 tablet (40 mg total) by mouth daily., Disp: 90 tablet, Rfl: 3   Zinc  Sulfate (ZINC  15 PO), Take 15 mg by mouth daily., Disp: , Rfl:   Consent:   NA  Disposition:   As needed  Her questions and concerns were addressed to her satisfaction. She voices understanding of the recommendations provided during this encounter.    Signed, Madonna Large, DO, [facility]  [facility]  [address] [place], [place] [address] 07/10/2023 8:41 AM

## 2023-07-12 NOTE — Progress Notes (Signed)
Complex Care Management Note  Care Guide Note 07/12/2023 Name: Briahna Mahdavi MRN: [id number] DOB: 1976-01-03  Arissa Karnowski is a 48 y.o. year old female who sees Grayce Sessions, NP for primary care. I reached out to Talbert Nan by phone today to offer complex care management services.  Ms. Macmahon was given information about Complex Care Management services today including:   The Complex Care Management services include support from the care team which includes your Nurse Coordinator, Clinical Social Worker, or Pharmacist.  The Complex Care Management team is here to help remove barriers to the health concerns and goals most important to you. Complex Care Management services are voluntary, and the patient may decline or stop services at any time by request to their care team member.   Complex Care Management Consent Status: Patient agreed to services and verbal consent obtained.   Follow up plan:  Telephone appointment with complex care management team member scheduled for:  08/03/23  Encounter Outcome:  Patient Scheduled  [name]  [facility]  [facility], [facility] Guide  Direct Dial: [phone]  Fax [phone]

## 2023-08-03 ENCOUNTER — Encounter: Payer: Self-pay | Admitting: Licensed Clinical Social Worker

## 2023-08-10 ENCOUNTER — Telehealth: Payer: Self-pay | Admitting: *Deleted

## 2023-08-10 ENCOUNTER — Ambulatory Visit: Payer: Self-pay | Admitting: Licensed Clinical Social Worker

## 2023-08-10 DIAGNOSIS — I10 Essential (primary) hypertension: Secondary | ICD-10-CM

## 2023-08-10 DIAGNOSIS — E785 Hyperlipidemia, unspecified: Secondary | ICD-10-CM

## 2023-08-10 DIAGNOSIS — E782 Mixed hyperlipidemia: Secondary | ICD-10-CM

## 2023-08-10 DIAGNOSIS — Z6838 Body mass index (BMI) 38.0-38.9, adult: Secondary | ICD-10-CM

## 2023-08-10 NOTE — Progress Notes (Signed)
Complex Care Management Care Guide Note  08/10/2023 Name: Beth [name] MRN: [id number] DOB: 1975-11-15  Beth [name] is a 48 y.o. year old female who is a primary care patient of Grayce Sessions, NP and is actively engaged with the care management team. I reached out to Talbert Nan by phone today to assist with scheduling  with the RN Case Manager.  Follow up plan: Telephone appointment with complex care management team member scheduled for:  2/28 [name]  [facility]  [facility], [facility] Guide  Direct Dial: [phone]  Fax [phone]

## 2023-08-10 NOTE — Patient Outreach (Signed)
  Care Coordination   Initial Visit Note   08/10/2023 Name: Beth [name] MRN: [id number] DOB: 02/22/1976  Beth [name] is a 48 y.o. year old female who sees Grayce Sessions, NP for primary care. I spoke with  Beth [name] by phone today.  What matters to the patients health and wellness today?  Food Insecurities    Goals Addressed             This Visit's Progress    Care Coordination Activities       Care Coordination Interventions: Patient stated that with the inflation going up she has a hard time buying food, she works full time and has income and does not receive food stamps. Sw will mail out food pantry resources and educated patient on the [employer] app. Patient wants to ne referred to a nurse about healthy eating, weight loss and food choices. SW will make the referral  SW will follow up on 08/31/2023 at 2:00 pm        SDOH assessments and interventions completed:  Yes  SDOH Interventions Today    Flowsheet Row Most Recent Value  SDOH Interventions   Food Insecurity Interventions Community Resources Provided  [SW will  mail food pantry list and educated the patient on th [facility] app]  Housing Interventions Intervention Not Indicated  Transportation Interventions Intervention Not Indicated        Care Coordination Interventions:  Yes, provided  Interventions Today    Flowsheet Row Most Recent Value  General Interventions   General Interventions Discussed/Reviewed General Interventions Discussed, Community Resources  [Sw will mail out food pantry resources and educated patient on the [facility] app. patient wants to ne referred to a nurse about healthy eating, weight loss and food choices. SW will make the referral]        Follow up plan: Follow up call scheduled for 08/31/2023 at 2:00 pm    Encounter Outcome:  Patient Visit Completed   Jeanie Cooks, PhD [facility], [employer] Social Worker Direct Dial:  [phone]  Fax: [phone]

## 2023-08-10 NOTE — Patient Instructions (Signed)
Visit Information  Thank you for taking time to visit with me today. Please don't hesitate to contact me if I can be of assistance to you.   Following are the goals we discussed today:   Goals Addressed             This Visit's Progress    Care Coordination Activities       Care Coordination Interventions: Patient stated that with the inflation going up she has a hard time buying food, she works full time and has income and does not receive food stamps. Sw will mail out food pantry resources and educated patient on the [facility] app. Patient wants to ne referred to a nurse about healthy eating, weight loss and food choices. SW will make the referral  SW will follow up on 08/31/2023 at 2:00 pm        Our next appointment is by telephone on 08/31/2023 at 2:00 pm  Please call the care guide team at [phone] if you need to cancel or reschedule your appointment.   If you are experiencing a Mental Health or Behavioral Health Crisis or need someone to talk to, please call the Suicide and Crisis Lifeline: 988 go to [facility] [address] [place] [id number]) call 911  Patient verbalizes understanding of instructions and care plan provided today and agrees to view in MyChart. Active MyChart status and patient understanding of how to access instructions and care plan via MyChart confirmed with patient.     Jeanie Cooks, PhD [facility], [facility] Social Worker Direct Dial: [phone]  Fax: [phone]

## 2023-08-10 NOTE — Progress Notes (Signed)
Scheduled Referral placed   Beth [name]  [facility]  [facility], [facility] Guide  Direct Dial: [phone]  Fax [phone]

## 2023-08-24 ENCOUNTER — Ambulatory Visit: Payer: Self-pay

## 2023-08-24 NOTE — Patient Outreach (Signed)
 Care Coordination   Initial Visit Note   08/24/2023 Name: Beth [name] MRN: [id number] DOB: 04/03/1976  Beth [name] is a 48 y.o. year old female who sees Grayce Sessions, NP for primary care. I spoke with  Talbert Nan by phone today.  What matters to the patients health and wellness today?  The patient was unable to talk this morning due to she had to take her child to an appointment. We rescheduled her visit.     SDOH assessments and interventions completed:  No     Care Coordination Interventions:  No, not indicated   Follow up plan:  08/31/23  10 am    Encounter Outcome:  Patient Visit Completed   Juanell Fairly RN, BSN, [facility]  [facility], [facility]  Care Coordinator Phone: [phone]   .

## 2023-08-28 ENCOUNTER — Ambulatory Visit (HOSPITAL_COMMUNITY)
Admission: EM | Admit: 2023-08-28 | Discharge: 2023-08-28 | Disposition: A | Discharge status: Home / Self Care | Attending: Family Medicine | Admitting: Family Medicine

## 2023-08-28 ENCOUNTER — Ambulatory Visit (INDEPENDENT_AMBULATORY_CARE_PROVIDER_SITE_OTHER)

## 2023-08-28 ENCOUNTER — Encounter (HOSPITAL_COMMUNITY): Payer: Self-pay | Admitting: *Deleted

## 2023-08-28 DIAGNOSIS — J09X2 Influenza due to identified novel influenza A virus with other respiratory manifestations: Secondary | ICD-10-CM

## 2023-08-28 DIAGNOSIS — J45909 Unspecified asthma, uncomplicated: Secondary | ICD-10-CM

## 2023-08-28 DIAGNOSIS — J45998 Other asthma: Secondary | ICD-10-CM | POA: Diagnosis not present

## 2023-08-28 DIAGNOSIS — R0682 Tachypnea, not elsewhere classified: Secondary | ICD-10-CM | POA: Diagnosis not present

## 2023-08-28 LAB — POC COVID19/FLU A&B COMBO
Covid Antigen, POC: NEGATIVE
Influenza A Antigen, POC: POSITIVE — AB
Influenza B Antigen, POC: NEGATIVE

## 2023-08-28 MED ORDER — IPRATROPIUM-ALBUTEROL 0.5-2.5 (3) MG/3ML IN SOLN: 3.0000 mL | Freq: Four times a day (QID) | RESPIRATORY_TRACT | 0 refills | Status: AC | PRN

## 2023-08-28 MED ORDER — METHYLPREDNISOLONE SODIUM SUCC 125 MG IJ SOLR
125.0000 mg | Freq: Once | INTRAMUSCULAR | Status: AC
  Administered 2023-08-28: 125 mg via INTRAMUSCULAR

## 2023-08-28 MED ORDER — PROMETHAZINE-DM 6.25-15 MG/5ML PO SYRP: 5.0000 mL | ORAL_SOLUTION | Freq: Three times a day (TID) | ORAL | 0 refills | Status: AC | PRN

## 2023-08-28 MED ORDER — AMOXICILLIN 875 MG PO TABS: 875.0000 mg | ORAL_TABLET | Freq: Two times a day (BID) | ORAL | 0 refills | Status: DC

## 2023-08-28 MED ORDER — ACETAMINOPHEN 325 MG PO TABS
650.0000 mg | ORAL_TABLET | Freq: Once | ORAL | Status: AC
  Administered 2023-08-28: 650 mg via ORAL

## 2023-08-28 MED ORDER — ACETAMINOPHEN 325 MG PO TABS
ORAL_TABLET | ORAL | Status: AC
  Filled 2023-08-28: qty 2

## 2023-08-28 MED ORDER — ALBUTEROL SULFATE (2.5 MG/3ML) 0.083% IN NEBU
INHALATION_SOLUTION | RESPIRATORY_TRACT | Status: AC
  Filled 2023-08-28: qty 3

## 2023-08-28 MED ORDER — METHYLPREDNISOLONE SODIUM SUCC 125 MG IJ SOLR
INTRAMUSCULAR | Status: AC
  Filled 2023-08-28: qty 2

## 2023-08-28 MED ORDER — OSELTAMIVIR PHOSPHATE 75 MG PO CAPS: 75.0000 mg | ORAL_CAPSULE | Freq: Two times a day (BID) | ORAL | 0 refills | Status: DC

## 2023-08-28 MED ORDER — ALBUTEROL SULFATE (2.5 MG/3ML) 0.083% IN NEBU
2.5000 mg | INHALATION_SOLUTION | Freq: Once | RESPIRATORY_TRACT | Status: AC
  Administered 2023-08-28: 2.5 mg via RESPIRATORY_TRACT

## 2023-08-28 MED ORDER — IPRATROPIUM-ALBUTEROL 0.5-2.5 (3) MG/3ML IN SOLN
3.0000 mL | Freq: Once | RESPIRATORY_TRACT | Status: AC
  Administered 2023-08-28: 3 mL via RESPIRATORY_TRACT

## 2023-08-28 MED ORDER — IPRATROPIUM-ALBUTEROL 0.5-2.5 (3) MG/3ML IN SOLN
RESPIRATORY_TRACT | Status: AC
  Filled 2023-08-28: qty 3

## 2023-08-28 MED ORDER — PREDNISONE 20 MG PO TABS: 20.0000 mg | ORAL_TABLET | Freq: Two times a day (BID) | ORAL | 0 refills | Status: AC

## 2023-08-28 NOTE — ED Provider Notes (Signed)
 [facility]    CSN: [id number] Arrival date & time: 08/28/23  1145      History   Chief Complaint Chief Complaint  Patient presents with   Cough   Nasal Congestion   Fever   Shortness of Breath   Generalized Body Aches   Emesis    HPI Beth [name] is a 48 y.o. female.   Patient history of asthma, history of acute respiratory distress due to hypoxia with COVID-19, hypertension, presents today for evaluation of acute onset cough, nasal congestion, generalized bodyaches, fever, nausea with vomiting.  On arrival patient is tachypneic with an oxygen level 95%, endorsing difficulty breathing.  Patient's fever on arrival is 102.8.  Patient has not taken any medication prior to arriving here at urgent care.  Patient has had no known sick contacts.  Has an albuterol inhaler reports  increased use of albuterol inhaler however it is not helping with her current symptoms.  Patient endorses chest heaviness but no chest tightness or chest pain.  Past Medical History:  Diagnosis Date   Asthma    Chronic pancreatitis (HCC)    DDD (degenerative disc disease), lumbosacral    DJD (degenerative joint disease)    Headache    Hyperlipidemia    Hypertension    Hypoglycemia    Obesity    Osteoarthritis    Prolactinoma (HCC)    Sciatica    Sleep apnea     Patient Active Problem List   Diagnosis Date Noted   Class 3 severe obesity due to excess calories with body mass index (BMI) of 40.0 to 44.9 in adult [facility]) 03/30/2023   Hypoglycemia 06/16/2022   Status post transsphenoidal pituitary resection (HCC) 01/04/2022   Low TSH level 10/21/2021   Dyslipidemia 07/15/2021   Osteoarthritis 08/20/2020   Sciatica 07/30/2020   DJD (degenerative joint disease)    Obstructive sleep apnea 03/11/2020   Snoring 02/05/2020   Pneumonia due to COVID-19 virus 12/15/2019   Acute respiratory failure with hypoxia (HCC) 12/15/2019   Hypokalemia 12/15/2019   Asthma without acute exacerbation  12/15/2019   Encounter for management of intrauterine contraceptive device (IUD) 06/09/2019   Essential hypertension 06/09/2019   Fatigue 06/09/2019   Slow transit constipation 06/09/2019   Hyperprolactinemia (HCC) 02/15/2018   Migraine 10/12/2017   Chronic pancreatitis (HCC) 11/09/2016   Prolactinoma (HCC) 11/09/2016   BMI 38.0-38.9,adult 11/09/2016   Candida rash of groin 11/09/2016   Mild intermittent asthma 11/09/2016   Mixed hyperlipidemia 11/09/2016    Past Surgical History:  Procedure Laterality Date   CHOLECYSTECTOMY     COLONOSCOPY     CRANIOTOMY N/A 01/04/2022   Procedure: Endonasal endoscopic resection of pituitary tumor;  Surgeon: Jadene Pierini, MD;  Location: [facility] OR;  Service: Neurosurgery;  Laterality: N/A;  RM 20   NASAL SINUS SURGERY N/A 01/04/2022   Procedure: ENDOSCOPIC SINUS SURGERY TRANSNASAL PITUITARY REMOVAL WITH NASAL NASOSETTAL FLAP;  Surgeon: Laren Boom, DO;  Location: [facility] OR;  Service: ENT;  Laterality: N/A;    OB History   No obstetric history on file.      Home Medications    Prior to Admission medications   Medication Sig Start Date End Date Taking? Authorizing Provider  albuterol (VENTOLIN HFA) 108 (90 Base) MCG/ACT inhaler Inhale 2 puffs into the lungs every 6 (six) hours as needed for wheezing or shortness of breath. 06/13/22  Yes Leslye Peer, MD  amoxicillin (AMOXIL) 875 MG tablet Take 1 tablet (875 mg total) by mouth 2 (  two) times daily. 08/28/23  Yes Bing Neighbors, NP  Ascorbic Acid (VITAMIN C) 1000 MG tablet Take 1,000 mg by mouth daily.   Yes [provider]  Cholecalciferol (VITAMIN D3 PO) Take 2,000 mg by mouth daily. On Hold   Yes [provider]  ergocalciferol (VITAMIN D2) 1.25 MG (50000 UT) capsule Take 1 capsule (50,000 Units total) by mouth once a week. 07/05/23  Yes Grayce Sessions, NP  ipratropium-albuterol (DUONEB) 0.5-2.5 (3) MG/3ML SOLN Take 3 mLs by nebulization every 6 (six) hours as  needed (shortness of breath, wheezing, and persistent cough). 08/28/23  Yes Bing Neighbors, NP  oseltamivir (TAMIFLU) 75 MG capsule Take 1 capsule (75 mg total) by mouth 2 (two) times daily. 08/28/23  Yes Bing Neighbors, NP  Pancrelipase, Lip-Prot-Amyl, (CREON) 24000-76000 units CPEP Take 2 capsules by mouth See admin instructions. Take 2 capsules three times daily with food and 1 capsule twice daily with snacks. 04/20/17  Yes [provider]  pantoprazole (PROTONIX) 20 MG tablet Take 20 mg by mouth daily. 11/25/19  Yes [provider]  predniSONE (DELTASONE) 20 MG tablet Take 1 tablet (20 mg total) by mouth 2 (two) times daily with a meal for 5 days. 08/29/23 09/03/23 Yes Bing Neighbors, NP  promethazine-dextromethorphan (PROMETHAZINE-DM) 6.25-15 MG/5ML syrup Take 5 mLs by mouth 3 (three) times daily as needed for cough. 08/28/23  Yes Bing Neighbors, NP  rosuvastatin (CRESTOR) 40 MG tablet Take 1 tablet (40 mg total) by mouth daily. 07/03/23  Yes Grayce Sessions, NP  benzonatate (TESSALON) 200 MG capsule Take 1 capsule (200 mg total) by mouth 3 (three) times daily as needed for cough. 06/13/22   Leslye Peer, MD  Butenafine HCl (MENTAX) 1 % cream Apply 1 application topically 2 (two) times daily. Patient taking differently: Apply 1 application  topically as needed. 06/11/20   Grayce Sessions, NP  Docusate Sodium (COLACE PO) Take 1 capsule by mouth daily.    [provider]  EPINEPHrine 0.3 mg/0.3 mL IJ SOAJ injection Inject 0.3 mg into the muscle as needed for anaphylaxis.    [provider]  fluticasone (FLONASE) 50 MCG/ACT nasal spray Place 2 sprays into both nostrils daily. 06/22/21   Leslye Peer, MD  levonorgestrel (MIRENA) 20 MCG/24HR IUD 1 each by Intrauterine route once.     [provider]  LORazepam (ATIVAN) 1 MG tablet Take 1 tablet (1 mg total) by mouth every 8 (eight) hours as needed for anxiety (mri). 12/21/22   Henreitta Leber, MD  Menthol, Topical Analgesic, (BIOFREEZE) 10 % CREA Apply 1 Application topically daily as needed (pain).    [provider]  ondansetron (ZOFRAN-ODT) 8 MG disintegrating tablet Take 1 tablet (8 mg total) by mouth every 8 (eight) hours as needed for nausea or vomiting. 06/09/22   Lonie Peak, MD  psyllium (REGULOID) 0.52 g capsule Take 0.52 capsules by mouth as needed.    [provider]  Zinc Sulfate (ZINC 15 PO) Take 15 mg by mouth daily.    [provider]    Family History Family History  Problem Relation Age of Onset   Hypertension Mother    Diabetes Mother    Colon cancer Mother    Arthritis Mother    Thyroid disease Mother    Asthma Mother    Osteoporosis Mother    Diabetes Father    Hypertension Father    Bell's palsy Father    Hypertension Sister  Heart murmur Sister    Lupus Sister    Hypertension Sister    Polycystic ovary syndrome Sister    Scoliosis Sister    Hypertension Brother    Heart failure Brother    Cancer Brother    Other Paternal Aunt        pituitary surgery    Social History Social History   Tobacco Use   Smoking status: Never    Passive exposure: Never   Smokeless tobacco: Never  Vaping Use   Vaping status: Never Used  Substance Use Topics   Alcohol use: Never   Drug use: Never     Allergies   [place], [place] grass, Cedar, Cladosporium cladosporioides, Dust mite extract, Elm bark [ulmus fulva], Molds & smuts, Mugwort, Nettle [nettle (urtica dioica)], Red mulberry allergy skin test, Sorrel-dock mix [sheep sorrel-yellow dock], Timothy grass pollen allergen, American cockroach, and Mixed ragweed   Review of Systems Review of Systems  Constitutional:  Positive for fever.  Respiratory:  Positive for cough and shortness of breath.   Gastrointestinal:  Positive for vomiting.     Physical Exam Triage Vital Signs ED Triage Vitals  Encounter Vitals Group     BP 08/28/23 1320 130/83     Systolic BP  Percentile --      Diastolic BP Percentile --      Pulse Rate 08/28/23 1320 89     Resp 08/28/23 1320 (!) 22     Temp 08/28/23 1320 (!) 102.8 F (39.3 C)     Temp Source 08/28/23 1320 Oral     SpO2 08/28/23 1320 95 %     Weight --      Height --      Head Circumference --      Peak Flow --      Pain Score 08/28/23 1318 9     Pain Loc --      Pain Education --      Exclude from Growth Chart --    No data found.  Updated Vital Signs BP 130/83 (BP Location: Left Arm)   Pulse 89   Temp (!) 102.8 F (39.3 C) (Oral)   Resp (!) 22   SpO2 95%   Visual Acuity Right Eye Distance:   Left Eye Distance:   Bilateral Distance:    Right Eye Near:   Left Eye Near:    Bilateral Near:     Physical Exam Vitals reviewed.  Constitutional:      General: She is not in acute distress.    Appearance: She is ill-appearing. She is not toxic-appearing.  HENT:     Head: Normocephalic and atraumatic.     Nose: Congestion and rhinorrhea present.  Eyes:     Extraocular Movements: Extraocular movements intact.     Pupils: Pupils are equal, round, and reactive to light.  Cardiovascular:     Rate and Rhythm: Normal rate and regular rhythm.  Pulmonary:     Effort: Tachypnea present.     Breath sounds: Decreased air movement present. Examination of the right-upper field reveals wheezing. Examination of the left-upper field reveals wheezing. Examination of the right-middle field reveals wheezing. Examination of the left-middle field reveals wheezing. Examination of the right-lower field reveals wheezing. Examination of the left-lower field reveals wheezing. Wheezing, rhonchi and rales present.  Musculoskeletal:        General: Normal range of motion.     Cervical back: Normal range of motion.  Lymphadenopathy:     Cervical: Cervical adenopathy present.  Skin:    General: Skin is warm and dry.  Neurological:     General: No focal deficit present.     Mental Status: She is alert.      UC  Treatments / Results  Labs (all labs ordered are listed, but only abnormal results are displayed) Labs Reviewed  POC COVID19/FLU A&B COMBO - Abnormal; Notable for the following components:      Result Value   Influenza A Antigen, POC Positive (*)    All other components within normal limits    EKG   Radiology DG Chest 2 View Result Date: 08/28/2023 CLINICAL DATA:  Shortness of breath.  Tachypnea. EXAM: CHEST - 2 VIEW COMPARISON:  CT on 01/13/2020 FINDINGS: The heart size remains normal. 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 Both lungs are clear. No pleural effusion. No skeletal abnormality identified. IMPRESSION: No active cardiopulmonary disease. Electronically Signed   By: Danae Orleans M.D.   On: 08/28/2023 16:26    Procedures Procedures (including critical care time)  Medications Ordered in UC Medications  acetaminophen (TYLENOL) tablet 650 mg (650 mg Oral Given 08/28/23 1327)  albuterol (PROVENTIL) (2.5 MG/3ML) 0.083% nebulizer solution 2.5 mg (2.5 mg Nebulization Given 08/28/23 1336)  ipratropium-albuterol (DUONEB) 0.5-2.5 (3) MG/3ML nebulizer solution 3 mL (3 mLs Nebulization Given 08/28/23 1336)  methylPREDNISolone sodium succinate (SOLU-MEDROL) 125 mg/2 mL injection 125 mg (125 mg Intramuscular Given 08/28/23 1336)    Initial Impression / Assessment and Plan / UC Course  I have reviewed the triage vital signs and the nursing notes.  Pertinent labs & imaging results that were available during my care of the patient were reviewed by me and considered in my medical decision making (see chart for details).    Acute asthma exacerbation secondary to infection with influenza A virus, Tamiflu treatment dose prescribed.  Patient was exhibiting asthma exacerbation symptoms on arrival therefore patient was treated with a DuoNeb and a subsequent dose of albuterol nebulizer solution with  nebulizer treatment.  Patient also received Solu-Medrol 125 mg IM given here in clinic.  Patient was dispensed with a home nebulizer machine and prescribed DuoNeb solution for every 6 hour use as needed for shortness of breath.  Obtained a chest x-ray did not see an obvious pneumonia present on exam however on Monday exam auscultated significant rales at the base of the bilateral lungs given patient's history of complicated COVID resulting in acute respiratory distress will cover patient empirically with amoxicillin for prophylactic treatment against possible evolving pneumonia.  Patient will start oral prednisone tomorrow she was given a one-time injection of Solu-Medrol here in clinic.  Patient will continue nebulizer treatments every 6 hours as needed.  Promethazine DM given for management of cough and congestion.  Given patient's history of complicated respiratory symptoms patient advised at any time if home treatments are not improving symptoms to present immediately to the emergency department for any respiratory distress symptoms or complications with breathing.  Patient verbalized understanding and agreement with plan today.   Final Clinical Impressions(s) / UC Diagnoses   Final diagnoses:  Asthma without acute exacerbation  Influenza due to identified novel influenza A virus with other respiratory manifestations     Discharge Instructions      Influenza test is positive.  You are considered contagious to others as long as you have a measurable fever with a temperature 100 F.  You should consider yourself infectious until you  are fever free for 24 hours without fever lowering medications.  Continue to alternate Tylenol and ibuprofen for management of fever.  Force fluids to maintain hydration. Tamiflu twice daily for the next 5 days to reduce symptoms and course of influenza virus.  Prednisone 40 mg daily for 5 days  ( start tomorrow).  DuoNeb treatments every 6 hours ( between next treatment  7:30-8:00 pm).  Promethazine DM for cough every 6 hours as needed.  As discussed treating prophylactically with amoxicillin for coverage of pneumonia.  Take medication as prescribed and complete entire course. If you develop any shortness of breath, wheezing or difficulty breathing go immediately to the nearest emergency department.       ED Prescriptions     Medication Sig Dispense Auth. Provider   oseltamivir (TAMIFLU) 75 MG capsule Take 1 capsule (75 mg total) by mouth 2 (two) times daily. 10 capsule Bing Neighbors, NP   predniSONE (DELTASONE) 20 MG tablet Take 1 tablet (20 mg total) by mouth 2 (two) times daily with a meal for 5 days. 10 tablet Bing Neighbors, NP   promethazine-dextromethorphan (PROMETHAZINE-DM) 6.25-15 MG/5ML syrup Take 5 mLs by mouth 3 (three) times daily as needed for cough. 140 mL Bing Neighbors, NP   ipratropium-albuterol (DUONEB) 0.5-2.5 (3) MG/3ML SOLN Take 3 mLs by nebulization every 6 (six) hours as needed (shortness of breath, wheezing, and persistent cough). 360 mL Bing Neighbors, NP   amoxicillin (AMOXIL) 875 MG tablet Take 1 tablet (875 mg total) by mouth 2 (two) times daily. 20 tablet Bing Neighbors, NP      PDMP not reviewed this encounter.   Bing Neighbors, NP 08/30/23 [phone]

## 2023-08-28 NOTE — Discharge Instructions (Addendum)
 Influenza test is positive.  You are considered contagious to others as long as you have a measurable fever with a temperature 100 F.  You should consider yourself infectious until you are fever free for 24 hours without fever lowering medications.  Continue to alternate Tylenol and ibuprofen for management of fever.  Force fluids to maintain hydration. Tamiflu twice daily for the next 5 days to reduce symptoms and course of influenza virus.  Prednisone 40 mg daily for 5 days  ( start tomorrow).  DuoNeb treatments every 6 hours ( between next treatment 7:30-8:00 pm).  Promethazine DM for cough every 6 hours as needed.  As discussed treating prophylactically with amoxicillin for coverage of pneumonia.  Take medication as prescribed and complete entire course. If you develop any shortness of breath, wheezing or difficulty breathing go immediately to the nearest emergency department.

## 2023-08-28 NOTE — ED Triage Notes (Signed)
 Pt states she has cough, congestion, SOB, body aches, fever since Sunday. She started vomiting this morning. She took cough med during the day and promethazine cough meds last night She did use her albuterol MDI last night

## 2023-08-31 ENCOUNTER — Encounter: Payer: BC Managed Care – PPO | Admitting: Licensed Clinical Social Worker

## 2023-08-31 ENCOUNTER — Ambulatory Visit: Payer: Self-pay

## 2023-08-31 NOTE — Patient Outreach (Signed)
 Care Coordination   Initial Visit Note   08/31/2023 Name: Story Conti MRN: [id number] DOB: Aug 29, 1975  Carynn Hoppes is a 48 y.o. year old female who sees Grayce Sessions, NP for primary care. I spoke with  Talbert Nan by phone today.  What matters to the patients health and wellness today?  Mrs. Marisol Giambra lives in her apartment with her 84 year old daughter. She reports that her blood pressure has been stable, measuring 130/83 as of March 4th. She is currently experiencing influenza A and is adhering to her prescribed treatment regimen, which includes antibiotics and cough syrup.  Although her dietary habits have been within acceptable limits, she is committed to monitoring and documenting her food intake. Mrs. Ragon believes she has improved her eating habits; however, she is perplexed by a recent increase in her cholesterol levels reflected in her lab results.  Mrs. Stobaugh has a history of a brain tumor, only partially removed, and is uncertain about the potential causes of her elevated cholesterol. She is scheduled to meet with her physician on April 17th and wants to discuss these concerns. I plan to follow up with her after the appointment and will provide her with educational materials addressing blood pressure management and dietary recommendations in the interim.                      Goals Addressed             This Visit's Progress    Patient Stated- She will monitor and manage her blood pressure and HDL       Patient Goals/Self Care Activities: -Patient/Caregiver will take medications as prescribed   -Patient/Caregiver will attend all scheduled provider appointments -Patient/Caregiver will call pharmacy for medication refills 3-7 days in advance of running out of medications -Patient/Caregiver will call provider office for new concerns or questions  -Patient/Caregiver will focus on medication adherence by taking medications as prescribed   -Calls provider office for new concerns, questions, or BP outside discussed parameters -Checks BP and records as discussed -Follows a low sodium diet/DASH diet  BP Readings from Last 3 Encounters:  08/28/23 130/83  07/10/23 128/88  06/29/23 126/84   Current Barriers:  Current antihyperlipidemic regimen: Crestor 40 mg daily Most recent lipid panel:     Component Value Date/Time   CHOL 250 (H) 06/29/2023 1047   TRIG 128 06/29/2023 1047   HDL 40 06/29/2023 1047   CHOLHDL 6.3 (H) 06/29/2023 1047   CHOLHDL 6.1 (H) 07/15/2021 1627   LDLCALC 187 (H) 06/29/2023 1047   LDLCALC 150 (H) 07/15/2021 1627   Reviewed medications with patient and discussed  - be open to making changes - I can manage, know and watch for signs of a heart attack - if I have chest pain, call for help - learn about small changes that will make a big difference - learn my personal risk factors                 SDOH assessments and interventions completed:  Yes  SDOH Interventions Today    Flowsheet Row Most Recent Value  SDOH Interventions   Food Insecurity Interventions Intervention Not Indicated  Housing Interventions Intervention Not Indicated  Transportation Interventions Intervention Not Indicated  Utilities Interventions Intervention Not Indicated        Care Coordination Interventions:  Yes, provided   Interventions Today    Flowsheet Row Most Recent Value  Chronic Disease   Chronic disease during today's visit Hypertension (HTN), Other  [  HLD]  General Interventions   General Interventions Discussed/Reviewed General Interventions Discussed, General Interventions Reviewed  Education Interventions   Education Provided Provided Education  Provided Verbal Education On Nutrition  Nutrition Interventions   Nutrition Discussed/Reviewed Nutrition Discussed  Pharmacy Interventions   Pharmacy Dicussed/Reviewed Pharmacy Topics Discussed, Pharmacy Topics Reviewed  Safety Interventions    Safety Discussed/Reviewed Safety Discussed, Fall Risk       Follow up plan: Follow up call scheduled for 10/12/23  11 am    Encounter Outcome:  Patient Visit Completed   Juanell Fairly RN, BSN, [facility]  [facility], [facility]  Care Coordinator Phone: [phone]

## 2023-08-31 NOTE — Patient Instructions (Addendum)
 Visit Information  Thank you for taking time to visit with me today. Please don't hesitate to contact me if I can be of assistance to you.   Following are the goals we discussed today:   Goals Addressed             This Visit's Progress    Patient Stated- She will monitor and manage her blood pressure and HDL       Patient Goals/Self Care Activities: -Patient/Caregiver will take medications as prescribed   -Patient/Caregiver will attend all scheduled provider appointments -Patient/Caregiver will call pharmacy for medication refills 3-7 days in advance of running out of medications -Patient/Caregiver will call provider office for new concerns or questions  -Patient/Caregiver will focus on medication adherence by taking medications as prescribed  -Calls provider office for new concerns, questions, or BP outside discussed parameters -Checks BP and records as discussed -Follows a low sodium diet/DASH diet  BP Readings from Last 3 Encounters:  08/28/23 130/83  07/10/23 128/88  06/29/23 126/84   Current Barriers:  Current antihyperlipidemic regimen: Crestor 40 mg daily Most recent lipid panel:     Component Value Date/Time   CHOL 250 (H) 06/29/2023 1047   TRIG 128 06/29/2023 1047   HDL 40 06/29/2023 1047   CHOLHDL 6.3 (H) 06/29/2023 1047   CHOLHDL 6.1 (H) 07/15/2021 1627   LDLCALC 187 (H) 06/29/2023 1047   LDLCALC 150 (H) 07/15/2021 1627   Reviewed medications with patient and discussed  - be open to making changes - I can manage, know and watch for signs of a heart attack - if I have chest pain, call for help - learn about small changes that will make a big difference - learn my personal risk factors                 Our next appointment is by telephone on 10/12/23 at 11 am  Please call the care guide team at [phone] if you need to cancel or reschedule your appointment.   If you are experiencing a Mental Health or Behavioral Health Crisis or need someone  to talk to, please call 1-800-273-TALK (toll free, 24 hour hotline)  Patient verbalizes understanding of instructions and care plan provided today and agrees to view in MyChart. Active MyChart status and patient understanding of how to access instructions and care plan via MyChart confirmed with patient.     Juanell Fairly RN, BSN, [facility]  [facility], [facility]  Care Coordinator Phone: [phone]

## 2023-09-03 ENCOUNTER — Other Ambulatory Visit (INDEPENDENT_AMBULATORY_CARE_PROVIDER_SITE_OTHER): Payer: Self-pay | Admitting: Primary Care

## 2023-09-03 DIAGNOSIS — E559 Vitamin D deficiency, unspecified: Secondary | ICD-10-CM

## 2023-09-14 ENCOUNTER — Encounter: Payer: Self-pay | Admitting: Licensed Clinical Social Worker

## 2023-09-15 DIAGNOSIS — G4733 Obstructive sleep apnea (adult) (pediatric): Secondary | ICD-10-CM | POA: Diagnosis not present

## 2023-10-04 ENCOUNTER — Telehealth (INDEPENDENT_AMBULATORY_CARE_PROVIDER_SITE_OTHER): Payer: Self-pay | Admitting: Primary Care

## 2023-10-04 NOTE — Telephone Encounter (Signed)
 Called to confirm appt. Pt will be present

## 2023-10-05 ENCOUNTER — Encounter (HOSPITAL_COMMUNITY): Payer: Self-pay

## 2023-10-05 ENCOUNTER — Ambulatory Visit (HOSPITAL_COMMUNITY)

## 2023-10-05 ENCOUNTER — Ambulatory Visit: Payer: Self-pay | Admitting: Licensed Clinical Social Worker

## 2023-10-05 ENCOUNTER — Ambulatory Visit (INDEPENDENT_AMBULATORY_CARE_PROVIDER_SITE_OTHER)

## 2023-10-05 ENCOUNTER — Ambulatory Visit (HOSPITAL_COMMUNITY): Admission: EM | Admit: 2023-10-05 | Discharge: 2023-10-05 | Disposition: A | Discharge status: Home / Self Care

## 2023-10-05 DIAGNOSIS — S46912A Strain of unspecified muscle, fascia and tendon at shoulder and upper arm level, left arm, initial encounter: Secondary | ICD-10-CM | POA: Diagnosis not present

## 2023-10-05 DIAGNOSIS — G8929 Other chronic pain: Secondary | ICD-10-CM | POA: Diagnosis not present

## 2023-10-05 DIAGNOSIS — S46911A Strain of unspecified muscle, fascia and tendon at shoulder and upper arm level, right arm, initial encounter: Secondary | ICD-10-CM

## 2023-10-05 DIAGNOSIS — M25562 Pain in left knee: Secondary | ICD-10-CM

## 2023-10-05 DIAGNOSIS — M1712 Unilateral primary osteoarthritis, left knee: Secondary | ICD-10-CM | POA: Diagnosis not present

## 2023-10-05 DIAGNOSIS — M25511 Pain in right shoulder: Secondary | ICD-10-CM | POA: Diagnosis not present

## 2023-10-05 MED ORDER — IBUPROFEN 800 MG PO TABS: 800.0000 mg | ORAL_TABLET | Freq: Three times a day (TID) | ORAL | 0 refills | Status: DC

## 2023-10-05 NOTE — Discharge Instructions (Signed)
 1. Chronic pain of left knee (Primary) - DG Knee Complete 4 Views Left x-ray performed in UC shows no acute fracture or dislocation of the left knee.  Most likely osteoarthritic or ligament in nature. - ibuprofen (ADVIL) 800 MG tablet; Take 1 tablet (800 mg total) by mouth 3 (three) times daily.  Dispense: 30 tablet; Refill: 0 - Apply knee brace/sleeve in UC for protection and compression of left knee until follow-up with sports medicine  2. Strain of left shoulder, initial encounter - DG Shoulder Right x-ray shows no acute fracture or dislocation of the right shoulder. - AMB referral to sports medicine for follow-up evaluation and management of chronic left knee pain and acute right shoulder pain. - Apply Shoulder Sling in UC for immobilization and protection of shoulder until follow-up with Ortho

## 2023-10-05 NOTE — ED Triage Notes (Signed)
 Pt states that she injured her left knee pain and has some left knee pain.  X3-4 months  Pt states that she has some right shoulder pain. X2 weeks Pt denies any known injury.

## 2023-10-05 NOTE — Patient Instructions (Signed)

## 2023-10-05 NOTE — Patient Outreach (Signed)
 Complex Care Management   Visit Note  10/05/2023  Name:  Beth [name] MRN: [id number] DOB: [birth year]  Situation: Referral received for Complex Care Management related to SDOH Barriers:  Food insecurity I obtained verbal consent from Patient.  Visit completed with patient  on the phone  Background:   Past Medical History:  Diagnosis Date   Asthma    Chronic pancreatitis (HCC)    DDD (degenerative disc disease), lumbosacral    DJD (degenerative joint disease)    Headache    Hyperlipidemia    Hypertension    Hypoglycemia    Obesity    Osteoarthritis    Prolactinoma (HCC)    Sciatica    Sleep apnea     Assessment: Patient Reported Symptoms:  Cognitive    Neurological      HEENT      Cardiovascular      Respiratory      Endocrine      Gastrointestinal      Genitourinary      Integumentary      Musculoskeletal      Psychosocial       There were no vitals filed for this visit.  Medications Reviewed Today   Medications were not reviewed in this encounter     Recommendation:   None patient received the resources in the mail   Follow Up Plan:   No further follow up needed  Jeanie Cooks, PhD [facility], [facility] Social Worker Direct Dial: [phone]  Fax: [phone]

## 2023-10-05 NOTE — ED Provider Notes (Signed)
 [facility]  Note:  This document was prepared using Dragon voice recognition software and may include unintentional dictation errors.  MRN: [id number] DOB: [birth year]  Subjective:   Beth [name] is a 48 y.o. female presenting for left knee pain x 3 to 4 months.  No known injury or trauma to the left knee.  Has not taken any over-the-counter medication to treat symptoms.  Increased pain with ambulation and standing for long periods of time.  Patient also reports right shoulder pain x 2 weeks with no known injury.  Has not taken any over-the-counter medication to treat symptoms.  No previous injury or trauma to the right shoulder.  No current facility-administered medications for this encounter.  Current Outpatient Medications:    albuterol (VENTOLIN HFA) 108 (90 Base) MCG/ACT inhaler, Inhale 2 puffs into the lungs every 6 (six) hours as needed for wheezing or shortness of breath., Disp: 8 g, Rfl: 6   Ascorbic Acid (VITAMIN C) 1000 MG tablet, Take 1,000 mg by mouth daily., Disp: , Rfl:    benzonatate (TESSALON) 200 MG capsule, Take 1 capsule (200 mg total) by mouth 3 (three) times daily as needed for cough., Disp: 30 capsule, Rfl: 3   Butenafine HCl (MENTAX) 1 % cream, Apply 1 application topically 2 (two) times daily. (Patient taking differently: Apply 1 application  topically as needed.), Disp: 24 g, Rfl: 1   Cholecalciferol (VITAMIN D3 PO), Take 2,000 mg by mouth daily. On Hold, Disp: , Rfl:    Docusate Sodium (COLACE PO), Take 1 capsule by mouth daily., Disp: , Rfl:    EPINEPHrine 0.3 mg/0.3 mL IJ SOAJ injection, Inject 0.3 mg into the muscle as needed for anaphylaxis., Disp: , Rfl:    ergocalciferol (VITAMIN D2) 1.25 MG (50000 UT) capsule, Take 1 capsule (50,000 Units total) by mouth once a week., Disp: 8 capsule, Rfl: 0   fluticasone (FLONASE) 50 MCG/ACT nasal spray, Place 2 sprays into both nostrils daily., Disp: 16 g, Rfl: 3   ibuprofen (ADVIL) 800 MG tablet, Take 1  tablet (800 mg total) by mouth 3 (three) times daily., Disp: 30 tablet, Rfl: 0   ipratropium-albuterol (DUONEB) 0.5-2.5 (3) MG/3ML SOLN, Take 3 mLs by nebulization every 6 (six) hours as needed (shortness of breath, wheezing, and persistent cough)., Disp: 360 mL, Rfl: 0   levonorgestrel (MIRENA) 20 MCG/24HR IUD, 1 each by Intrauterine route once. , Disp: , Rfl:    LORazepam (ATIVAN) 1 MG tablet, Take 1 tablet (1 mg total) by mouth every 8 (eight) hours as needed for anxiety (mri)., Disp: 3 tablet, Rfl: 0   Menthol, Topical Analgesic, (BIOFREEZE) 10 % CREA, Apply 1 Application topically daily as needed (pain)., Disp: , Rfl:    ondansetron (ZOFRAN-ODT) 8 MG disintegrating tablet, Take 1 tablet (8 mg total) by mouth every 8 (eight) hours as needed for nausea or vomiting., Disp: 20 tablet, Rfl: 1   oseltamivir (TAMIFLU) 75 MG capsule, Take 1 capsule (75 mg total) by mouth 2 (two) times daily., Disp: 10 capsule, Rfl: 0   Pancrelipase, Lip-Prot-Amyl, (CREON) 24000-76000 units CPEP, Take 2 capsules by mouth See admin instructions. Take 2 capsules three times daily with food and 1 capsule twice daily with snacks., Disp: , Rfl:    pantoprazole (PROTONIX) 20 MG tablet, Take 20 mg by mouth daily., Disp: , Rfl:    promethazine-dextromethorphan (PROMETHAZINE-DM) 6.25-15 MG/5ML syrup, Take 5 mLs by mouth 3 (three) times daily as needed for cough., Disp: 140 mL, Rfl: 0   psyllium (REGULOID)  0.52 g capsule, Take 0.52 capsules by mouth as needed., Disp: , Rfl:    rosuvastatin (CRESTOR) 40 MG tablet, Take 1 tablet (40 mg total) by mouth daily., Disp: 90 tablet, Rfl: 3   Zinc Sulfate (ZINC 15 PO), Take 15 mg by mouth daily., Disp: , Rfl:    amoxicillin (AMOXIL) 875 MG tablet, Take 1 tablet (875 mg total) by mouth 2 (two) times daily., Disp: 20 tablet, Rfl: 0   Allergies  Allergen Reactions   Bahia Hives, Shortness Of Breath and Swelling    Found through allergy testing, not sure which item caused the shortness of  breath   [place] Grass Hives, Shortness Of Breath and Swelling    Found through allergy testing, not sure which item caused the shortness of breath   [employer], Shortness Of Breath and Swelling    Found through allergy testing, not sure which item caused the shortness of breath   Cladosporium Cladosporioides Hives, Shortness Of Breath and Swelling    Found through allergy testing, not sure which item caused the shortness of breath   Dust Mite Extract Hives, Shortness Of Breath and Swelling    Found through allergy testing, not sure which item caused the shortness of breath   Elm Bark [Ulmus Fulva] Hives, Shortness Of Breath and Swelling    Found through allergy testing, not sure which item caused the shortness of breath   Molds & Smuts Hives, Shortness Of Breath and Swelling    Found through allergy testing, not sure which item caused the shortness of breath   Mugwort Hives, Shortness Of Breath and Swelling    Found through allergy testing, not sure which item caused the shortness of breath   Nettle [Nettle (Urtica Dioica)] Hives, Shortness Of Breath and Swelling   Red Mulberry Allergy Skin Test Hives, Shortness Of Breath and Swelling    Found through allergy testing, not sure which item caused the shortness of breath   Sorrel-Dock Mix [Sheep Sorrel-Yellow Dock] Hives, Shortness Of Breath and Swelling    Found through allergy testing, not sure which item caused the shortness of breath   Timothy Grass Pollen Allergen Hives, Shortness Of Breath and Swelling    Found through allergy testing, not sure which item caused the shortness of breath   American Cockroach    Mixed Ragweed     Past Medical History:  Diagnosis Date   Asthma    Chronic pancreatitis (HCC)    DDD (degenerative disc disease), lumbosacral    DJD (degenerative joint disease)    Headache    Hyperlipidemia    Hypertension    Hypoglycemia    Obesity    Osteoarthritis    Prolactinoma (HCC)    Sciatica    Sleep apnea       Past Surgical History:  Procedure Laterality Date   CHOLECYSTECTOMY     COLONOSCOPY     CRANIOTOMY N/A 01/04/2022   Procedure: Endonasal endoscopic resection of pituitary tumor;  Surgeon: Jadene Pierini, MD;  Location: [facility] OR;  Service: Neurosurgery;  Laterality: N/A;  RM 20   NASAL SINUS SURGERY N/A 01/04/2022   Procedure: ENDOSCOPIC SINUS SURGERY TRANSNASAL PITUITARY REMOVAL WITH NASAL NASOSETTAL FLAP;  Surgeon: Laren Boom, DO;  Location: [facility] OR;  Service: ENT;  Laterality: N/A;    Family History  Problem Relation Age of Onset   Hypertension Mother    Diabetes Mother    Colon cancer Mother    Arthritis Mother    Thyroid disease Mother  Asthma Mother    Osteoporosis Mother    Diabetes Father    Hypertension Father    Bell's palsy Father    Hypertension Sister    Heart murmur Sister    Lupus Sister    Hypertension Sister    Polycystic ovary syndrome Sister    Scoliosis Sister    Hypertension Brother    Heart failure Brother    Cancer Brother    Other Paternal Aunt        pituitary surgery    Social History   Tobacco Use   Smoking status: Never    Passive exposure: Never   Smokeless tobacco: Never  Vaping Use   Vaping status: Never Used  Substance Use Topics   Alcohol use: Never   Drug use: Never    ROS Refer to HPI for ROS details.  Objective:   Vitals: BP 130/84 (BP Location: Left Arm)   Pulse 73   Temp 98.7 F (37.1 C) (Oral)   Resp 18   Ht 5\' 9"  (1.753 m)   Wt 279 lb (126.6 kg)   SpO2 97%   BMI 41.20 kg/m   Physical Exam Vitals and nursing note reviewed.  Constitutional:      General: She is not in acute distress.    Appearance: Normal appearance. She is not ill-appearing or toxic-appearing.  HENT:     Head: Normocephalic.  Cardiovascular:     Rate and Rhythm: Normal rate.  Pulmonary:     Effort: Pulmonary effort is normal. No respiratory distress.  Musculoskeletal:     Right shoulder: Tenderness and bony tenderness  present. No swelling or deformity. Decreased range of motion. Decreased strength. Normal pulse.     Left knee: Bony tenderness and crepitus present. No swelling, deformity or erythema. Decreased range of motion. Tenderness present. Normal pulse.  Skin:    General: Skin is warm and dry.     Capillary Refill: Capillary refill takes less than 2 seconds.  Neurological:     General: No focal deficit present.     Mental Status: She is alert and oriented to person, place, and time.  Psychiatric:        Mood and Affect: Mood normal.     Procedures  No results found for this or any previous visit (from the past 24 hours).  Assessment and Plan :   1. Chronic pain of left knee (Primary) - DG Knee Complete 4 Views Left x-ray performed in UC shows no acute fracture or dislocation of the left knee.  Most likely osteoarthritic or ligament in nature. - ibuprofen (ADVIL) 800 MG tablet; Take 1 tablet (800 mg total) by mouth 3 (three) times daily.  Dispense: 30 tablet; Refill: 0 - Apply knee brace/sleeve in UC for protection and compression of left knee until follow-up with sports medicine  2. Strain of left shoulder, initial encounter - DG Shoulder Right x-ray shows no acute fracture or dislocation of the right shoulder. - AMB referral to sports medicine for follow-up evaluation and management of chronic left knee pain and acute right shoulder pain. - Apply Shoulder Sling in UC for immobilization and protection of shoulder until follow-up with Ortho -Continue to monitor symptoms for any change in severity if there is any escalation of current symptoms or development of new symptoms follow-up in ER for further evaluation and management.  Lucky Cowboy   Cumming, [place] B, [place] 10/05/23 2057

## 2023-10-06 ENCOUNTER — Encounter (HOSPITAL_COMMUNITY): Payer: Self-pay

## 2023-10-08 ENCOUNTER — Encounter: Payer: Self-pay | Admitting: Family Medicine

## 2023-10-08 ENCOUNTER — Ambulatory Visit: Admitting: Family Medicine

## 2023-10-08 VITALS — BP 139/88 | Ht 69.0 in | Wt 279.0 lb

## 2023-10-08 DIAGNOSIS — M7581 Other shoulder lesions, right shoulder: Secondary | ICD-10-CM | POA: Diagnosis not present

## 2023-10-08 DIAGNOSIS — M1712 Unilateral primary osteoarthritis, left knee: Secondary | ICD-10-CM | POA: Diagnosis not present

## 2023-10-08 NOTE — Progress Notes (Signed)
 Subjective:   HPI: Patient is a 48 y.o. female here for left knee and right shoulder pain.   Right shoulder pain began approximately three weeks ago, described as sharp and noticed upon waking--patient states possibly due to sleeping position. She has experienced similar episodes in the past, with prior improvement through physical therapy. Current symptoms include difficulty with quick movements and lifting heavy objects, though she is able to drive. She is currently in a cast and uses Tylenol, heat, and ice for symptom relief.   Regarding her left knee, pain started in November while carrying a box when the knee gave out. Pain is localized more to the medial and superior aspects, with associated clicking and popping. Symptoms are worsened by sit-to-stand transitions, prolonged standing, and walking. She denies trauma or falls. Conservative measures (heat, ice, Tylenol, elevation, stretching, rest, and knee brace) have provided only temporary relief. She has not yet tried physical therapy. She denies radiation or numbness/tingling from the knee but has a history of bilateral sciatica, herniated disc, degenerative disc and joint disease, and osteoarthritis.   Past Medical History:  Diagnosis Date   Asthma    Chronic pancreatitis (HCC)    DDD (degenerative disc disease), lumbosacral    DJD (degenerative joint disease)    Headache    Hyperlipidemia    Hypertension    Hypoglycemia    Obesity    Osteoarthritis    Prolactinoma (HCC)    Sciatica    Sleep apnea     Current Outpatient Medications on File Prior to Visit  Medication Sig Dispense Refill   albuterol (VENTOLIN HFA) 108 (90 Base) MCG/ACT inhaler Inhale 2 puffs into the lungs every 6 (six) hours as needed for wheezing or shortness of breath. 8 g 6   amoxicillin (AMOXIL) 875 MG tablet Take 1 tablet (875 mg total) by mouth 2 (two) times daily. 20 tablet 0   Ascorbic Acid (VITAMIN C) 1000 MG tablet Take 1,000 mg by mouth daily.      benzonatate (TESSALON) 200 MG capsule Take 1 capsule (200 mg total) by mouth 3 (three) times daily as needed for cough. 30 capsule 3   Butenafine HCl (MENTAX) 1 % cream Apply 1 application topically 2 (two) times daily. (Patient taking differently: Apply 1 application  topically as needed.) 24 g 1   Cholecalciferol (VITAMIN D3 PO) Take 2,000 mg by mouth daily. On Hold     Docusate Sodium (COLACE PO) Take 1 capsule by mouth daily.     EPINEPHrine 0.3 mg/0.3 mL IJ SOAJ injection Inject 0.3 mg into the muscle as needed for anaphylaxis.     ergocalciferol (VITAMIN D2) 1.25 MG (50000 UT) capsule Take 1 capsule (50,000 Units total) by mouth once a week. 8 capsule 0   fluticasone (FLONASE) 50 MCG/ACT nasal spray Place 2 sprays into both nostrils daily. 16 g 3   ibuprofen (ADVIL) 800 MG tablet Take 1 tablet (800 mg total) by mouth 3 (three) times daily. 30 tablet 0   ipratropium-albuterol (DUONEB) 0.5-2.5 (3) MG/3ML SOLN Take 3 mLs by nebulization every 6 (six) hours as needed (shortness of breath, wheezing, and persistent cough). 360 mL 0   levonorgestrel (MIRENA) 20 MCG/24HR IUD 1 each by Intrauterine route once.      LORazepam (ATIVAN) 1 MG tablet Take 1 tablet (1 mg total) by mouth every 8 (eight) hours as needed for anxiety (mri). 3 tablet 0   Menthol, Topical Analgesic, (BIOFREEZE) 10 % CREA Apply 1 Application topically daily as needed (pain).  ondansetron (ZOFRAN-ODT) 8 MG disintegrating tablet Take 1 tablet (8 mg total) by mouth every 8 (eight) hours as needed for nausea or vomiting. 20 tablet 1   oseltamivir (TAMIFLU) 75 MG capsule Take 1 capsule (75 mg total) by mouth 2 (two) times daily. 10 capsule 0   Pancrelipase, Lip-Prot-Amyl, (CREON) 24000-76000 units CPEP Take 2 capsules by mouth See admin instructions. Take 2 capsules three times daily with food and 1 capsule twice daily with snacks.     pantoprazole (PROTONIX) 20 MG tablet Take 20 mg by mouth daily.     promethazine-dextromethorphan  (PROMETHAZINE-DM) 6.25-15 MG/5ML syrup Take 5 mLs by mouth 3 (three) times daily as needed for cough. 140 mL 0   psyllium (REGULOID) 0.52 g capsule Take 0.52 capsules by mouth as needed.     rosuvastatin (CRESTOR) 40 MG tablet Take 1 tablet (40 mg total) by mouth daily. 90 tablet 3   Zinc Sulfate (ZINC 15 PO) Take 15 mg by mouth daily.     No current facility-administered medications on file prior to visit.    Past Surgical History:  Procedure Laterality Date   CHOLECYSTECTOMY     COLONOSCOPY     CRANIOTOMY N/A 01/04/2022   Procedure: Endonasal endoscopic resection of pituitary tumor;  Surgeon: Cannon Champion, MD;  Location: [facility] OR;  Service: Neurosurgery;  Laterality: N/A;  RM 20   NASAL SINUS SURGERY N/A 01/04/2022   Procedure: ENDOSCOPIC SINUS SURGERY TRANSNASAL PITUITARY REMOVAL WITH NASAL NASOSETTAL FLAP;  Surgeon: Skotnicki, Meghan A, DO;  Location: [facility] OR;  Service: ENT;  Laterality: N/A;    Allergies  Allergen Reactions   Bahia Hives, Shortness Of Breath and Swelling    Found through allergy testing, not sure which item caused the shortness of breath   [place] Grass Hives, Shortness Of Breath and Swelling    Found through allergy testing, not sure which item caused the shortness of breath   [employer], Shortness Of Breath and Swelling    Found through allergy testing, not sure which item caused the shortness of breath   Cladosporium Cladosporioides Hives, Shortness Of Breath and Swelling    Found through allergy testing, not sure which item caused the shortness of breath   Dust Mite Extract Hives, Shortness Of Breath and Swelling    Found through allergy testing, not sure which item caused the shortness of breath   Elm Bark [Ulmus Fulva] Hives, Shortness Of Breath and Swelling    Found through allergy testing, not sure which item caused the shortness of breath   Molds & Smuts Hives, Shortness Of Breath and Swelling    Found through allergy testing, not sure which item  caused the shortness of breath   Mugwort Hives, Shortness Of Breath and Swelling    Found through allergy testing, not sure which item caused the shortness of breath   Nettle [Nettle (Urtica Dioica)] Hives, Shortness Of Breath and Swelling   Red [place]berry Allergy Skin Test Hives, Shortness Of Breath and Swelling    Found through allergy testing, not sure which item caused the shortness of breath   Sorrel-Dock Mix [Sheep Sorrel-Yellow Dock] Hives, Shortness Of Breath and Swelling    Found through allergy testing, not sure which item caused the shortness of breath   Timothy Grass Pollen Allergen Hives, Shortness Of Breath and Swelling    Found through allergy testing, not sure which item caused the shortness of breath   American Cockroach    Mixed Ragweed     BP  139/88   Ht 5\' 9"  (1.753 m)   Wt 279 lb (126.6 kg)   BMI 41.20 kg/m       No data to display              No data to display              Objective:  Physical Exam:  Gen: NAD, comfortable in exam room  MSK:    Left knee - no visible swelling or bruising. Tenderness to palpation over the medial joint line. Pain and weakness noted with knee extension. Positive valgus stress test and positive McMurray test. Right shoulder - no visible swelling or bruising. Tenderness noted in the anterior shoulder and supraspinatus region. Full range of motion. Weakness with abduction. Positive Neer and lift-off tests; Hawkins test negative.  Neuro: sensation and motor function intact    Assessment & Plan:  Right Rotator Cuff Tendinopathy with possible bursitis The patient presents with right shoulder pain ongoing for 3 weeks, with exam findings consistent with rotator cuff tendinopathy and subacromial bursitis, including tenderness over the supraspinatus, weakness with abduction, and positive Neer and lift-off tests. No evidence of acute trauma. She has had similar episodes in the past that responded to physical therapy.    Plan:   - Initiate physical therapy focusing on rotator cuff strengthening and scapular stabilization - Continue use of NSAIDs (e.g., ibuprofen) and conservative measures (ice/heat as needed) - Avoid overhead lifting or aggravating movements - If no improvement with PT and conservative treatment, consider MRI to assess for rotator cuff tear - Follow up in 4-6 weeks to assess response to therapy  2. Left Knee Pain - Likely Arthritis with Possible Medial Meniscus Tear The patient reports left knee pain since November, worsened by activity, with mechanical symptoms including clicking and popping. Exam reveals medial joint line tenderness, pain with extension, and positive valgus and McMurray tests, concerning for possible medial meniscal pathology. History of osteoarthritis and degenerative joint disease supports an underlying arthritic component.  Plan:  - Begin physical therapy with focus on quadriceps strengthening, range of motion, and joint stabilization - Continue NSAIDs (e.g., ibuprofen) and other conservative measures (ice, elevation, rest as needed) - Continue use of knee brace as needed for support during prolonged activity - Consider MRI of the knee if no improvement with conservative management - Follow up in 4-6 weeks to reassess symptoms and response to therapy

## 2023-10-10 ENCOUNTER — Telehealth (INDEPENDENT_AMBULATORY_CARE_PROVIDER_SITE_OTHER): Payer: Self-pay | Admitting: Primary Care

## 2023-10-10 NOTE — Telephone Encounter (Signed)
 Called pt to confirm appt. Pt will be present.

## 2023-10-11 ENCOUNTER — Other Ambulatory Visit (INDEPENDENT_AMBULATORY_CARE_PROVIDER_SITE_OTHER): Payer: BC Managed Care – PPO

## 2023-10-11 DIAGNOSIS — E782 Mixed hyperlipidemia: Secondary | ICD-10-CM | POA: Diagnosis not present

## 2023-10-11 DIAGNOSIS — I10 Essential (primary) hypertension: Secondary | ICD-10-CM | POA: Diagnosis not present

## 2023-10-11 DIAGNOSIS — E559 Vitamin D deficiency, unspecified: Secondary | ICD-10-CM

## 2023-10-11 NOTE — Progress Notes (Signed)
 PT came into the office today for lab work

## 2023-10-12 ENCOUNTER — Ambulatory Visit: Payer: Self-pay

## 2023-10-12 DIAGNOSIS — Z1231 Encounter for screening mammogram for malignant neoplasm of breast: Secondary | ICD-10-CM | POA: Diagnosis not present

## 2023-10-12 LAB — CBC WITH DIFFERENTIAL/PLATELET
Basophils Absolute: 0 10*3/uL (ref 0.0–0.2)
Basos: 1 %
EOS (ABSOLUTE): 0.2 10*3/uL (ref 0.0–0.4)
Eos: 4 %
Hematocrit: 37.5 % (ref 34.0–46.6)
Hemoglobin: 12.4 g/dL (ref 11.1–15.9)
Immature Grans (Abs): 0 10*3/uL (ref 0.0–0.1)
Immature Granulocytes: 0 %
Lymphocytes Absolute: 2.5 10*3/uL (ref 0.7–3.1)
Lymphs: 55 %
MCH: 28.4 pg (ref 26.6–33.0)
MCHC: 33.1 g/dL (ref 31.5–35.7)
MCV: 86 fL (ref 79–97)
Monocytes Absolute: 0.5 10*3/uL (ref 0.1–0.9)
Monocytes: 10 %
Neutrophils Absolute: 1.4 10*3/uL (ref 1.4–7.0)
Neutrophils: 30 %
Platelets: 312 10*3/uL (ref 150–450)
RBC: 4.36 x10E6/uL (ref 3.77–5.28)
RDW: 14.6 % (ref 11.7–15.4)
WBC: 4.6 10*3/uL (ref 3.4–10.8)

## 2023-10-12 LAB — CMP14+EGFR
ALT: 15 IU/L (ref 0–32)
AST: 16 IU/L (ref 0–40)
Albumin: 4.2 g/dL (ref 3.9–4.9)
Alkaline Phosphatase: 77 IU/L (ref 44–121)
BUN/Creatinine Ratio: 9 (ref 9–23)
BUN: 9 mg/dL (ref 6–24)
Bilirubin Total: 0.4 mg/dL (ref 0.0–1.2)
CO2: 23 mmol/L (ref 20–29)
Calcium: 9.6 mg/dL (ref 8.7–10.2)
Chloride: 106 mmol/L (ref 96–106)
Creatinine, Ser: 1.01 mg/dL — ABNORMAL HIGH (ref 0.57–1.00)
Globulin, Total: 2.4 g/dL (ref 1.5–4.5)
Glucose: 85 mg/dL (ref 70–99)
Potassium: 4.3 mmol/L (ref 3.5–5.2)
Sodium: 141 mmol/L (ref 134–144)
Total Protein: 6.6 g/dL (ref 6.0–8.5)
eGFR: 69 mL/min/{1.73_m2} (ref >59)

## 2023-10-12 LAB — VITAMIN D 25 HYDROXY (VIT D DEFICIENCY, FRACTURES): Vit D, 25-Hydroxy: 31.5 ng/mL (ref 30.0–100.0)

## 2023-10-12 LAB — LIPID PANEL
Chol/HDL Ratio: 5.8 ratio — ABNORMAL HIGH (ref 0.0–4.4)
Cholesterol, Total: 204 mg/dL — ABNORMAL HIGH (ref 100–199)
HDL: 35 mg/dL — ABNORMAL LOW (ref >39)
LDL Chol Calc (NIH): 138 mg/dL — ABNORMAL HIGH (ref 0–99)
Triglycerides: 174 mg/dL — ABNORMAL HIGH (ref 0–149)
VLDL Cholesterol Cal: 31 mg/dL (ref 5–40)

## 2023-10-12 NOTE — Patient Outreach (Signed)
 Complex Care Management   Visit Note  10/12/2023  Name:  Beth [name] MRN: [id number] DOB: 02-19-1976  Situation: Referral received for Complex Care Management related to  HLD  I obtained verbal consent from Patient.  Visit completed with patient  on the phone  Background:   Past Medical History:  Diagnosis Date   Asthma    Chronic pancreatitis (HCC)    DDD (degenerative disc disease), lumbosacral    DJD (degenerative joint disease)    Headache    Hyperlipidemia    Hypertension    Hypoglycemia    Obesity    Osteoarthritis    Prolactinoma (HCC)    Sciatica    Sleep apnea     Assessment: Patient Reported Symptoms:  Cognitive Cognitive Status: Able to follow simple commands, Alert and oriented to person, place, and time, Difficulties with attention and concentration      Neurological Neurological Review of Symptoms: Headaches Neurological Conditions:  (half brain tumor)  HEENT        Cardiovascular Cardiovascular Symptoms Reported: No symptoms reported Does patient have uncontrolled Hypertension?: No Cardiovascular Conditions: Valvular disease Cardiovascular Management Strategies: Medication therapy  Respiratory Respiratory Symptoms Reported: Shortness of breath Respiratory Conditions: Asthma Respiratory Self-Management Outcome: 4 (good)  Endocrine Is patient diabetic?: No    Gastrointestinal Gastrointestinal Symptoms Reported: Constipation, Other Other Gastrointestinal Symptoms: EPI Gastrointestinal Conditions: Abdominal pain Gastrointestinal Self-Management Outcome: 4 (good)    Genitourinary Genitourinary Symptoms Reported: Incontinence Genitourinary Management Strategies: Bladder program Bladder Program: Positioning Genitourinary Comment: [place]  Integumentary Integumentary Symptoms Reported: Skin changes (Dry areas  like a rash and flaking) Skin Management Strategies: Medication therapy Skin Self-Management Outcome: 3 (uncertain)  Musculoskeletal  Musculoskelatal Symptoms Reviewed: Difficulty walking Additional Musculoskeletal Details: because of knee Musculoskeletal Conditions: Back pain, Osteoarthritis, Joint pain Musculoskeletal Self-Management Outcome: 4 (good) Falls in the past year?: No    Psychosocial              10/12/2023   11:17 AM  Depression screen PHQ 2/9  Decreased Interest 1  Down, Depressed, Hopeless 1  PHQ - 2 Score 2  Altered sleeping 2  Tired, decreased energy 2  Change in appetite 2  Feeling bad or failure about yourself  2  Trouble concentrating 1  Moving slowly or fidgety/restless 1  Suicidal thoughts 0  PHQ-9 Score 12  Difficult doing work/chores Not difficult at all    There were no vitals filed for this visit.  Medications Reviewed Today     Reviewed by Weyman Corning, RN (Registered Nurse) on 10/12/23 at 1105  Med List Status: <None>   Medication Order Taking? Sig Documenting Provider Last Dose Status Informant  albuterol  (VENTOLIN  HFA) 108 (90 Base) MCG/ACT inhaler [id number] Yes Inhale 2 puffs into the lungs every 6 (six) hours as needed for wheezing or shortness of breath. Shelah Lamar RAMAN, MD Taking Active   amoxicillin  (AMOXIL ) 875 MG tablet [id number] No Take 1 tablet (875 mg total) by mouth 2 (two) times daily.  Patient not taking: Reported on 10/12/2023   Arloa Suzen RAMAN, NP Not Taking Active   Ascorbic Acid  (VITAMIN C) 1000 MG tablet [id number] Yes Take 1,000 mg by mouth daily. [provider] Taking Active Self  benzonatate  (TESSALON ) 200 MG capsule [id number] Yes Take 1 capsule (200 mg total) by mouth 3 (three) times daily as needed for cough. Shelah Lamar RAMAN, MD Taking Active   Butenafine  HCl (MENTAX) 1 % cream [id number] Yes Apply 1 application topically 2 (two) times daily.  Patient taking  differently: Apply 1 application  topically as needed.   Celestia Rosaline SQUIBB, NP Taking Active Self  Cholecalciferol (VITAMIN D3 PO) [id number] Yes Take 2,000 mg by mouth daily. On Hold  [provider] Taking Active Self  Docusate Sodium  (COLACE PO) [id number] Yes Take 1 capsule by mouth daily. [provider] Taking Active Self  EPINEPHrine  0.3 mg/0.3 mL IJ SOAJ injection [id number]  Inject 0.3 mg into the muscle as needed for anaphylaxis. [provider]  Active Self           Med Note SOILA LYLE JAYSON Pablo Dec 26, 2021  9:08 AM) Pt needs a refill, current pen is expired  ergocalciferol  (VITAMIN D2) 1.25 MG (50000 UT) capsule [id number] No Take 1 capsule (50,000 Units total) by mouth once a week.  Patient not taking: Reported on 10/12/2023   Celestia Rosaline SQUIBB, NP Not Taking Active   fluticasone  (FLONASE ) 50 MCG/ACT nasal spray [id number] Yes Place 2 sprays into both nostrils daily. Shelah Lamar RAMAN, MD Taking Active Self  ibuprofen  (ADVIL ) 800 MG tablet [id number] Yes Take 1 tablet (800 mg total) by mouth 3 (three) times daily. Reddick, Ethel B, NP Taking Active   ipratropium-albuterol  (DUONEB) 0.5-2.5 (3) MG/3ML SOLN [id number] Yes Take 3 mLs by nebulization every 6 (six) hours as needed (shortness of breath, wheezing, and persistent cough). Arloa Suzen RAMAN, NP Taking Active   levonorgestrel  (MIRENA ) 20 MCG/24HR IUD [id number] Yes 1 each by Intrauterine route once.  [provider] Taking Active Self  LORazepam  (ATIVAN ) 1 MG tablet [id number] Yes Take 1 tablet (1 mg total) by mouth every 8 (eight) hours as needed for anxiety (mri). Vaslow, Zachary K, MD Taking Active   Menthol, Topical Analgesic, (BIOFREEZE) 10 % CREA [id number] Yes Apply 1 Application topically daily as needed (pain). [provider] Taking Active Self  ondansetron  (ZOFRAN -ODT) 8 MG disintegrating tablet [id number] Yes Take 1 tablet (8 mg total) by mouth every 8 (eight) hours as needed for nausea or vomiting. Izell Domino, MD Taking Active   oseltamivir  (TAMIFLU ) 75 MG capsule [id number] No Take 1 capsule (75 mg total) by mouth 2 (two) times daily.  Patient not taking:  Reported on 10/12/2023   Arloa Suzen RAMAN, NP Not Taking Active   Pancrelipase , Lip-Prot-Amyl, (CREON ) 24000-76000 units CPEP [id number] Yes Take 2 capsules by mouth See admin instructions. Take 2 capsules three times daily with food and 1 capsule twice daily with snacks. [provider] Taking Active Self  pantoprazole  (PROTONIX ) 20 MG tablet [id number] Yes Take 20 mg by mouth daily. [provider] Taking Active Self  promethazine -dextromethorphan  (PROMETHAZINE -DM) 6.25-15 MG/5ML syrup [id number] Yes Take 5 mLs by mouth 3 (three) times daily as needed for cough. Arloa Suzen RAMAN, NP Taking Active   psyllium (REGULOID) 0.52 g capsule [id number] Yes Take 0.52 capsules by mouth as needed. [provider] Taking Active Self  rosuvastatin  (CRESTOR ) 40 MG tablet [id number] Yes Take 1 tablet (40 mg total) by mouth daily. Celestia Rosaline SQUIBB, NP Taking Active   Zinc  Sulfate (ZINC  15 PO) [id number] Yes Take 15 mg by mouth daily. [provider] Taking Active Self            Recommendation:   PCP Follow-up  Follow Up Plan:   Telephone follow-up in 1 month  Malon Branton Weyman RN, BSN, [facility]  [facility], [facility]  Care Coordinator Phone: [phone]

## 2023-10-12 NOTE — Patient Instructions (Signed)
 Visit Information  Thank you for taking time to visit with me today. Please don't hesitate to contact me if I can be of assistance to you before our next scheduled appointment.  Your next care management appointment is by telephone on   at 11/16/23 11 am  Telephone follow-up in 1 month  Please call the care guide team at [phone] if you need to cancel, schedule, or reschedule an appointment.   Please call [phone]-TALK (toll free, 24 hour hotline) if you are experiencing a Mental Health or Behavioral Health Crisis or need someone to talk to.  Wilbert Diver RN, BSN, [facility]  [facility], [facility]  Care Coordinator Phone: [phone]

## 2023-10-16 ENCOUNTER — Other Ambulatory Visit (INDEPENDENT_AMBULATORY_CARE_PROVIDER_SITE_OTHER): Payer: Self-pay | Admitting: Primary Care

## 2023-10-16 ENCOUNTER — Encounter (INDEPENDENT_AMBULATORY_CARE_PROVIDER_SITE_OTHER): Payer: Self-pay | Admitting: Primary Care

## 2023-10-16 DIAGNOSIS — E785 Hyperlipidemia, unspecified: Secondary | ICD-10-CM

## 2023-10-16 MED ORDER — FENOFIBRATE 145 MG PO TABS: 145.0000 mg | ORAL_TABLET | Freq: Every day | ORAL | 1 refills | Status: AC

## 2023-10-23 NOTE — Therapy (Signed)
 OUTPATIENT PHYSICAL THERAPY SHOULDER/KNEE EVALUATION   Patient Name: Beth [name] MRN: [id number] DOB:30-Sep-1975, 48 y.o., female Today's Date: 10/25/2023  END OF SESSION:  PT End of Session - 10/24/23 1706     Visit Number 1    Number of Visits 12    Date for PT Re-Evaluation 12/24/22    Authorization Type [employer]    PT Start Time 1700    PT Stop Time 1745    PT Time Calculation (min) 45 min    Activity Tolerance Patient tolerated treatment well    Behavior During Therapy WFL for tasks assessed/performed             Past Medical History:  Diagnosis Date   Asthma    Chronic pancreatitis (HCC)    DDD (degenerative disc disease), lumbosacral    DJD (degenerative joint disease)    Headache    Hyperlipidemia    Hypertension    Hypoglycemia    Obesity    Osteoarthritis    Prolactinoma (HCC)    Sciatica    Sleep apnea    Past Surgical History:  Procedure Laterality Date   CHOLECYSTECTOMY     COLONOSCOPY     CRANIOTOMY N/A 01/04/2022   Procedure: Endonasal endoscopic resection of pituitary tumor;  Surgeon: Cannon Champion, MD;  Location: [facility] OR;  Service: Neurosurgery;  Laterality: N/A;  RM 20   NASAL SINUS SURGERY N/A 01/04/2022   Procedure: ENDOSCOPIC SINUS SURGERY TRANSNASAL PITUITARY REMOVAL WITH NASAL NASOSETTAL FLAP;  Surgeon: Daleen Dubs, DO;  Location: [facility] OR;  Service: ENT;  Laterality: N/A;   Patient Active Problem List   Diagnosis Date Noted   Class 3 severe obesity due to excess calories with body mass index (BMI) of 40.0 to 44.9 in adult (HCC) 03/30/2023   Hypoglycemia 06/16/2022   Status post transsphenoidal pituitary resection (HCC) 01/04/2022   Low TSH level 10/21/2021   Dyslipidemia 07/15/2021   Osteoarthritis 08/20/2020   Sciatica 07/30/2020   DJD (degenerative joint disease)    Obstructive sleep apnea 03/11/2020   Snoring 02/05/2020   Pneumonia due to COVID-19 virus 12/15/2019   Acute respiratory failure with hypoxia (HCC) 12/15/2019    Hypokalemia 12/15/2019   Asthma without acute exacerbation 12/15/2019   Encounter for management of intrauterine contraceptive device (IUD) 06/09/2019   Essential hypertension 06/09/2019   Fatigue 06/09/2019   Slow transit constipation 06/09/2019   Hyperprolactinemia (HCC) 02/15/2018   Migraine 10/12/2017   Chronic pancreatitis (HCC) 11/09/2016   Prolactinoma (HCC) 11/09/2016   BMI 38.0-38.9,adult 11/09/2016   Candida rash of groin 11/09/2016   Mild intermittent asthma 11/09/2016   Mixed hyperlipidemia 11/09/2016    PCP: Marius Siemens, NP   REFERRING PROVIDER: Rodgers Clack, DO  REFERRING DIAG: [phone] (ICD-10-CM) - Rotator cuff tendinitis, right M17.12 (ICD-10-CM) - Primary osteoarthritis of left knee  THERAPY DIAG:  Unsteadiness on feet  Chronic pain of left knee  Chronic right shoulder pain  Rationale for Evaluation and Treatment: Rehabilitation  ONSET DATE: Acute for shoulder, chronic for knee  SUBJECTIVE:  SUBJECTIVE STATEMENT: Describes chronic L knee pain onsetting 04/2023 following furniture moving.  Has been dx'ed with L knee OA and possible meniscus tear. Hand dominance: Right  PERTINENT HISTORY: Also with right shoulder shoulder pain for the last 3 weeks.  No injury or trauma. She does note when she gets out of bed she typically pushes up on the right side No radiation of pain No numbness or tingling Previous shoulder issues for which she did physical therapy following COVID hospitalization in 2021 She has started doing some shoulder circles and wall crawls Evaluated at urgent care 10/05/23 -X-rays were unremarkable of the shoulder - Was given a sling - Rx ibuprofen  800 mg 3 times daily as needed.  This has been somewhat helpful - Recommended to follow-up with sports  medicine  Patient with left knee pain since 04/2023 and right shoulder pain x 3 weeks Left medial knee pain without any specific injury or trauma Pain along the medial knee Some occasional clicking and popping Seen in urgent care 10/05/2023 - Urgent care notes reviewed in detail during the visit today - Fitted with a knee brace - X-ray showed mild osteoarthritis Given Rx ibuprofen  800 mg 3 times daily as needed which has been somewhat helpful  PAIN:  Are you having pain? Yes: NPRS scale: 10/10 Pain location: L knee Pain description: ache Aggravating factors: descending stairs, prolonged walking/sitting Relieving factors: heat, topical gels  PAIN:  Are you having pain? Yes: NPRS scale: 10/10 Pain location: R shoulder Pain description: ache, sharp Aggravating factors: OH motions, unexpected motion Relieving factors: massage, heat/ice    PRECAUTIONS: None  RED FLAGS: None   WEIGHT BEARING RESTRICTIONS: No  FALLS:  Has patient fallen in last 6 months? No  OCCUPATION: Office work  PLOF: Independent  PATIENT GOALS:To manage my shoulder and knee pain  NEXT MD VISIT: 6 weeks  OBJECTIVE:  Note: Objective measures were completed at Evaluation unless otherwise noted.  DIAGNOSTIC FINDINGS:  Right shoulder x-ray 10/05/2023 at urgent care personally reviewed and interpreted by me showing: - Mild A/C joint degenerative changes - No other bony abnormalities   Left knee x-ray 10/05/2023 4 views AP/lateral/obliques personally reviewed and interpreted by me today showing: - Mild degenerative changes along the medial and patellofemoral compartments - No other bony abnormalities  PATIENT SURVEYS:  LEFS 16/80  POSTURE: Rounded shoulders  UPPER EXTREMITY ROM:   A/PROM Right eval Left eval  Shoulder flexion 90/160   Shoulder extension    Shoulder abduction 90/160   Shoulder adduction    Shoulder internal rotation    Shoulder external rotation    Elbow flexion    Elbow  extension    Wrist flexion    Wrist extension    Wrist ulnar deviation    Wrist radial deviation    Wrist pronation    Wrist supination    (Blank rows = not tested)  UPPER EXTREMITY MMT:  MMT Right eval Left eval  Shoulder flexion 4-   Shoulder extension    Shoulder abduction 4-   Shoulder adduction    Shoulder internal rotation    Shoulder external rotation    Middle trapezius    Lower trapezius    Elbow flexion    Elbow extension    Wrist flexion    Wrist extension    Wrist ulnar deviation    Wrist radial deviation    Wrist pronation    Wrist supination    Grip strength (lbs)    (Blank rows = not tested)  SHOULDER SPECIAL TESTS: Impingement tests: Neer impingement test: positive  and Hawkins/Kennedy impingement test: positive  Rotator cuff assessment: Drop arm test: negative, Empty can test: negative, and Hornblower's sign: negative   LOWER EXTREMITY MMT:    MMT Right eval Left eval  Hip flexion    Hip extension    Hip abduction    Hip adduction    Hip internal rotation    Hip external rotation    Knee flexion  4  Knee extension  4-  Ankle dorsiflexion    Ankle plantarflexion    Ankle inversion    Ankle eversion     (Blank rows = not tested)   LOWER EXTREMITY ROM:     Active  Right eval Left eval  Hip flexion    Hip extension    Hip abduction    Hip adduction    Hip internal rotation    Hip external rotation    Knee flexion 125 135  Knee extension    Ankle dorsiflexion    Ankle plantarflexion    Ankle inversion    Ankle eversion     (Blank rows = not tested)   LOWER EXTREMITY SPECIAL TESTS:  Knee special tests: Lateral pull sign: negative and varus/valgus stress negative for laxity  PALPATION:  TTP R biceps tendon, AC joint   Functional testing:  30s chair stand test 0 reps                                                                                                                          TREATMENT DATE:  [facility]  Treatment:                                                DATE: 10/24/23 Eval R shoulder/L knee   PATIENT EDUCATION: Education details: Discussed eval findings, rehab rationale and POC and patient is in agreement  Person educated: Patient Education method: Explanation Education comprehension: verbalized understanding and needs further education  HOME EXERCISE PROGRAM: TBD due to time constraints  ASSESSMENT:   CLINICAL IMPRESSION: Patient is a 48 y.o. female who was seen today for physical therapy evaluation and treatment for chronic R shoulder and L knee pain. Patient presents with limited R shoulder AROM and strength with positive impingement signs but no signs of RC tear.  L knee flexion is mildly limited with quadriceps strength deficit noted as evidenced by lateral patellar tracking.  OBJECTIVE IMPAIRMENTS: Abnormal gait, decreased activity tolerance, decreased endurance, decreased knowledge of condition, decreased knowledge of use of DME, decreased mobility, difficulty walking, decreased ROM, decreased strength, impaired perceived functional ability, impaired UE functional use, postural dysfunction, obesity, and pain.   ACTIVITY LIMITATIONS: carrying, lifting, sitting, standing, squatting, stairs, dressing, reach over head, and locomotion level  PERSONAL FACTORS: Age, Behavior pattern, Fitness, Past/current experiences, Time since onset of injury/illness/exacerbation,  and 1 comorbidity: Hx of long Covid  are also affecting patient's functional outcome.   REHAB POTENTIAL: Good  CLINICAL DECISION MAKING: Stable/uncomplicated  EVALUATION COMPLEXITY: Moderate   GOALS: Goals reviewed with patient? No  SHORT TERM GOALS: Target date: 11/14/2023    Patient to demonstrate independence in HEP  Baseline: TBD due to time constraints Goal status: INITIAL  2.  Patient will increase AROM R shoulder to 160d flexion an abduction Baseline:  A/PROM Right eval Left eval  Shoulder  flexion 90/160   Shoulder extension    Shoulder abduction 90/160    Goal status: INITIAL  3.  Increase L knee flexion to 130d Baseline: 125d Goal status: INITIAL    LONG TERM GOALS: Target date: 12/05/2023    Patient will acknowledge 6/10 pain at least once during episode of care  in L knee and R shoulder Baseline: 10/10 Goal status: INITIAL  2.  Patient will score at least 24/80 on LEFS to signify clinically meaningful improvement in functional abilities.   Baseline: 16/80 Goal status: INITIAL  3.  Patient will increase 30s chair stand reps from 0 to 5 without arms to demonstrate and improved functional ability with less pain/difficulty as well as reduce fall risk.  Baseline:  Goal status: INITIAL  4.  Increase R shoulder strength to 4/5 Baseline:  MMT Right eval Left eval  Shoulder flexion 4-   Shoulder extension    Shoulder abduction 4-    Goal status: INITIAL  5.  Increase L knee strength to 4+/5 Baseline:  MMT Right eval Left eval  Hip flexion    Hip extension    Hip abduction    Hip adduction    Hip internal rotation    Hip external rotation    Knee flexion  4  Knee extension  4-   Goal status: INITIAL    PLAN:  PT FREQUENCY: 1-2x/week  PT DURATION: 6 weeks  PLANNED INTERVENTIONS: 97110-Therapeutic exercises, 97530- Therapeutic activity, 97112- Neuromuscular re-education, 97535- Self Care, [id number]- Manual therapy, [phone]- Gait training, and Patient/Family education  PLAN FOR NEXT SESSION: HEP review and update, manual techniques as appropriate, aerobic tasks, ROM and flexibility activities, strengthening and PREs, TPDN, gait and balance training as needed     Eldon Greenland, PT 10/25/2023, 11:55 AM

## 2023-10-24 ENCOUNTER — Other Ambulatory Visit: Payer: Self-pay

## 2023-10-24 ENCOUNTER — Ambulatory Visit: Attending: Family Medicine

## 2023-10-24 DIAGNOSIS — M1712 Unilateral primary osteoarthritis, left knee: Secondary | ICD-10-CM | POA: Insufficient documentation

## 2023-10-24 DIAGNOSIS — G8929 Other chronic pain: Secondary | ICD-10-CM | POA: Insufficient documentation

## 2023-10-24 DIAGNOSIS — M7581 Other shoulder lesions, right shoulder: Secondary | ICD-10-CM | POA: Diagnosis not present

## 2023-10-24 DIAGNOSIS — R2681 Unsteadiness on feet: Secondary | ICD-10-CM | POA: Diagnosis not present

## 2023-10-24 DIAGNOSIS — M25562 Pain in left knee: Secondary | ICD-10-CM | POA: Insufficient documentation

## 2023-10-24 DIAGNOSIS — M25511 Pain in right shoulder: Secondary | ICD-10-CM | POA: Insufficient documentation

## 2023-10-30 NOTE — Therapy (Unsigned)
 OUTPATIENT PHYSICAL THERAPY TREATMENT NOTE   Patient Name: Michaelanne Boatwright MRN: [id number] DOB:1976/05/28, 48 y.o., female Today's Date: 10/31/2023  END OF SESSION:  PT End of Session - 10/31/23 1532     Visit Number 2    Number of Visits 12    Date for PT Re-Evaluation 12/24/22    Authorization Type BCBS    PT Start Time 1530    PT Stop Time 1610    PT Time Calculation (min) 40 min    Activity Tolerance Patient tolerated treatment well    Behavior During Therapy WFL for tasks assessed/performed              Past Medical History:  Diagnosis Date   Asthma    Chronic pancreatitis (HCC)    DDD (degenerative disc disease), lumbosacral    DJD (degenerative joint disease)    Headache    Hyperlipidemia    Hypertension    Hypoglycemia    Obesity    Osteoarthritis    Prolactinoma (HCC)    Sciatica    Sleep apnea    Past Surgical History:  Procedure Laterality Date   CHOLECYSTECTOMY     COLONOSCOPY     CRANIOTOMY N/A 01/04/2022   Procedure: Endonasal endoscopic resection of pituitary tumor;  Surgeon: Cannon Champion, MD;  Location: [facility] OR;  Service: Neurosurgery;  Laterality: N/A;  RM 20   NASAL SINUS SURGERY N/A 01/04/2022   Procedure: ENDOSCOPIC SINUS SURGERY TRANSNASAL PITUITARY REMOVAL WITH NASAL NASOSETTAL FLAP;  Surgeon: Daleen Dubs, DO;  Location: [facility] OR;  Service: ENT;  Laterality: N/A;   Patient Active Problem List   Diagnosis Date Noted   Class 3 severe obesity due to excess calories with body mass index (BMI) of 40.0 to 44.9 in adult 03/30/2023   Hypoglycemia 06/16/2022   Status post transsphenoidal pituitary resection (HCC) 01/04/2022   Low TSH level 10/21/2021   Dyslipidemia 07/15/2021   Osteoarthritis 08/20/2020   Sciatica 07/30/2020   DJD (degenerative joint disease)    Obstructive sleep apnea 03/11/2020   Snoring 02/05/2020   Pneumonia due to COVID-19 virus 12/15/2019   Acute respiratory failure with hypoxia (HCC) 12/15/2019    Hypokalemia 12/15/2019   Asthma without acute exacerbation 12/15/2019   Encounter for management of intrauterine contraceptive device (IUD) 06/09/2019   Essential hypertension 06/09/2019   Fatigue 06/09/2019   Slow transit constipation 06/09/2019   Hyperprolactinemia (HCC) 02/15/2018   Migraine 10/12/2017   Chronic pancreatitis (HCC) 11/09/2016   Prolactinoma (HCC) 11/09/2016   BMI 38.0-38.9,adult 11/09/2016   Candida rash of groin 11/09/2016   Mild intermittent asthma 11/09/2016   Mixed hyperlipidemia 11/09/2016    PCP: Marius Siemens, NP   REFERRING PROVIDER: Rodgers Clack, DO  REFERRING DIAG: [phone] (ICD-10-CM) - Rotator cuff tendinitis, right M17.12 (ICD-10-CM) - Primary osteoarthritis of left knee  THERAPY DIAG:  Unsteadiness on feet  Chronic pain of left knee  Chronic right shoulder pain  Rationale for Evaluation and Treatment: Rehabilitation  ONSET DATE: Acute for shoulder, chronic for knee  SUBJECTIVE:  SUBJECTIVE STATEMENT: No new c/o.   Hand dominance: Right  PERTINENT HISTORY: Also with right shoulder shoulder pain for the last 3 weeks.  No injury or trauma. She does note when she gets out of bed she typically pushes up on the right side No radiation of pain No numbness or tingling Previous shoulder issues for which she did physical therapy following COVID hospitalization in 2021 She has started doing some shoulder circles and wall crawls Evaluated at urgent care 10/05/23 -X-rays were unremarkable of the shoulder - Was given a sling - Rx ibuprofen  800 mg 3 times daily as needed.  This has been somewhat helpful - Recommended to follow-up with sports medicine  Patient with left knee pain since 04/2023 and right shoulder pain x 3 weeks Left medial knee pain without any  specific injury or trauma Pain along the medial knee Some occasional clicking and popping Seen in urgent care 10/05/2023 - Urgent care notes reviewed in detail during the visit today - Fitted with a knee brace - X-ray showed mild osteoarthritis Given Rx ibuprofen  800 mg 3 times daily as needed which has been somewhat helpful  PAIN:  Are you having pain? Yes: NPRS scale: 10/10 Pain location: L knee Pain description: ache Aggravating factors: descending stairs, prolonged walking/sitting Relieving factors: heat, topical gels  PAIN:  Are you having pain? Yes: NPRS scale: 10/10 Pain location: R shoulder Pain description: ache, sharp Aggravating factors: OH motions, unexpected motion Relieving factors: massage, heat/ice    PRECAUTIONS: None  RED FLAGS: None   WEIGHT BEARING RESTRICTIONS: No  FALLS:  Has patient fallen in last 6 months? No  OCCUPATION: Office work  PLOF: Independent  PATIENT GOALS:To manage my shoulder and knee pain  NEXT MD VISIT: 6 weeks  OBJECTIVE:  Note: Objective measures were completed at Evaluation unless otherwise noted.  DIAGNOSTIC FINDINGS:  Right shoulder x-ray 10/05/2023 at urgent care personally reviewed and interpreted by me showing: - Mild A/C joint degenerative changes - No other bony abnormalities   Left knee x-ray 10/05/2023 4 views AP/lateral/obliques personally reviewed and interpreted by me today showing: - Mild degenerative changes along the medial and patellofemoral compartments - No other bony abnormalities  PATIENT SURVEYS:  LEFS 16/80  POSTURE: Rounded shoulders  UPPER EXTREMITY ROM:   A/PROM Right eval Left eval  Shoulder flexion 90/160   Shoulder extension    Shoulder abduction 90/160   Shoulder adduction    Shoulder internal rotation    Shoulder external rotation    Elbow flexion    Elbow extension    Wrist flexion    Wrist extension    Wrist ulnar deviation    Wrist radial deviation    Wrist pronation     Wrist supination    (Blank rows = not tested)  UPPER EXTREMITY MMT:  MMT Right eval Left eval  Shoulder flexion 4-   Shoulder extension    Shoulder abduction 4-   Shoulder adduction    Shoulder internal rotation    Shoulder external rotation    Middle trapezius    Lower trapezius    Elbow flexion    Elbow extension    Wrist flexion    Wrist extension    Wrist ulnar deviation    Wrist radial deviation    Wrist pronation    Wrist supination    Grip strength (lbs)    (Blank rows = not tested)  SHOULDER SPECIAL TESTS: Impingement tests: Neer impingement test: positive  and Hawkins/Kennedy impingement test: positive  Rotator cuff assessment: Drop arm test: negative, Empty can test: negative, and Hornblower's sign: negative   LOWER EXTREMITY MMT:    MMT Right eval Left eval  Hip flexion    Hip extension    Hip abduction    Hip adduction    Hip internal rotation    Hip external rotation    Knee flexion  4  Knee extension  4-  Ankle dorsiflexion    Ankle plantarflexion    Ankle inversion    Ankle eversion     (Blank rows = not tested)   LOWER EXTREMITY ROM:     Active  Right eval Left eval  Hip flexion    Hip extension    Hip abduction    Hip adduction    Hip internal rotation    Hip external rotation    Knee flexion 125 135  Knee extension    Ankle dorsiflexion    Ankle plantarflexion    Ankle inversion    Ankle eversion     (Blank rows = not tested)   LOWER EXTREMITY SPECIAL TESTS:  Knee special tests: Lateral pull sign: negative and varus/valgus stress negative for laxity  PALPATION:  TTP R biceps tendon, AC joint   Functional testing:  30s chair stand test 0 reps                                                                                                                          TREATMENT DATE:  [facility] Adult PT Treatment:                                                DATE: 10/31/23 Therapeutic Exercise: Nustep L1   Therapeutic  Activity: Supine press 15x Ranger  Supine OH press 15x L QS 3s 15x L SAQ 15x L SLR 15x  [facility] Adult PT Treatment:                                                DATE: 10/24/23 Eval R shoulder/L knee   PATIENT EDUCATION: Education details: Discussed eval findings, rehab rationale and POC and patient is in agreement  Person educated: Patient Education method: Explanation Education comprehension: verbalized understanding and needs further education  HOME EXERCISE PROGRAM: TBD due to time constraints  ASSESSMENT:   CLINICAL IMPRESSION: First f/u session.  Focus of session was HEP establishment for L knee and R shoulder.  Incorporated aerobic work, ROM and functional strengthening.  Limited tolerance to all activities due to pain and fatigue.      Patient is a 48 y.o. female who was seen today for physical therapy evaluation and treatment for chronic R shoulder and L knee pain. Patient presents with limited  R shoulder AROM and strength with positive impingement signs but no signs of RC tear.  L knee flexion is mildly limited with quadriceps strength deficit noted as evidenced by lateral patellar tracking.  OBJECTIVE IMPAIRMENTS: Abnormal gait, decreased activity tolerance, decreased endurance, decreased knowledge of condition, decreased knowledge of use of DME, decreased mobility, difficulty walking, decreased ROM, decreased strength, impaired perceived functional ability, impaired UE functional use, postural dysfunction, obesity, and pain.   ACTIVITY LIMITATIONS: carrying, lifting, sitting, standing, squatting, stairs, dressing, reach over head, and locomotion level  PERSONAL FACTORS: Age, Behavior pattern, Fitness, Past/current experiences, Time since onset of injury/illness/exacerbation, and 1 comorbidity: Hx of long Covid  are also affecting patient's functional outcome.   REHAB POTENTIAL: Good  CLINICAL DECISION MAKING: Stable/uncomplicated  EVALUATION COMPLEXITY:  Moderate   GOALS: Goals reviewed with patient? No  SHORT TERM GOALS: Target date: 11/14/2023    Patient to demonstrate independence in HEP  Baseline: TBD due to time constraints Goal status: INITIAL  2.  Patient will increase AROM R shoulder to 160d flexion an abduction Baseline:  A/PROM Right eval Left eval  Shoulder flexion 90/160   Shoulder extension    Shoulder abduction 90/160    Goal status: INITIAL  3.  Increase L knee flexion to 130d Baseline: 125d Goal status: INITIAL    LONG TERM GOALS: Target date: 12/05/2023    Patient will acknowledge 6/10 pain at least once during episode of care  in L knee and R shoulder Baseline: 10/10 Goal status: INITIAL  2.  Patient will score at least 24/80 on LEFS to signify clinically meaningful improvement in functional abilities.   Baseline: 16/80 Goal status: INITIAL  3.  Patient will increase 30s chair stand reps from 0 to 5 without arms to demonstrate and improved functional ability with less pain/difficulty as well as reduce fall risk.  Baseline:  Goal status: INITIAL  4.  Increase R shoulder strength to 4/5 Baseline:  MMT Right eval Left eval  Shoulder flexion 4-   Shoulder extension    Shoulder abduction 4-    Goal status: INITIAL  5.  Increase L knee strength to 4+/5 Baseline:  MMT Right eval Left eval  Hip flexion    Hip extension    Hip abduction    Hip adduction    Hip internal rotation    Hip external rotation    Knee flexion  4  Knee extension  4-   Goal status: INITIAL    PLAN:  PT FREQUENCY: 1-2x/week  PT DURATION: 6 weeks  PLANNED INTERVENTIONS: 97110-Therapeutic exercises, 97530- Therapeutic activity, 97112- Neuromuscular re-education, 97535- Self Care, 82956- Manual therapy, [phone]- Gait training, and Patient/Family education  PLAN FOR NEXT SESSION: HEP review and update, manual techniques as appropriate, aerobic tasks, ROM and flexibility activities, strengthening and PREs, TPDN,  gait and balance training as needed     Eldon Greenland, PT 10/31/2023, 4:14 PM

## 2023-10-31 ENCOUNTER — Ambulatory Visit: Attending: Family Medicine

## 2023-10-31 DIAGNOSIS — M25562 Pain in left knee: Secondary | ICD-10-CM | POA: Diagnosis not present

## 2023-10-31 DIAGNOSIS — M25511 Pain in right shoulder: Secondary | ICD-10-CM | POA: Insufficient documentation

## 2023-10-31 DIAGNOSIS — R2681 Unsteadiness on feet: Secondary | ICD-10-CM | POA: Insufficient documentation

## 2023-10-31 DIAGNOSIS — G8929 Other chronic pain: Secondary | ICD-10-CM | POA: Diagnosis not present

## 2023-11-07 ENCOUNTER — Ambulatory Visit: Admitting: Physical Therapy

## 2023-11-14 ENCOUNTER — Ambulatory Visit

## 2023-11-14 DIAGNOSIS — M25562 Pain in left knee: Secondary | ICD-10-CM | POA: Diagnosis not present

## 2023-11-14 DIAGNOSIS — G8929 Other chronic pain: Secondary | ICD-10-CM

## 2023-11-14 DIAGNOSIS — R2681 Unsteadiness on feet: Secondary | ICD-10-CM | POA: Diagnosis not present

## 2023-11-14 DIAGNOSIS — M25511 Pain in right shoulder: Secondary | ICD-10-CM | POA: Diagnosis not present

## 2023-11-14 NOTE — Therapy (Signed)
 OUTPATIENT PHYSICAL THERAPY TREATMENT NOTE   Patient Name: Shiana Rappleye MRN: [id number] DOB:1976/05/08, 48 y.o., female Today's Date: 11/14/2023  END OF SESSION:   PT End of Session - 11/14/23 1709     Visit Number 3    Number of Visits 12    Date for PT Re-Evaluation 12/24/22    Authorization Type [employer]BS    PT Start Time 1704    PT Stop Time 1743    PT Time Calculation (min) 39 min    Activity Tolerance Patient tolerated treatment well    Behavior During Therapy WFL for tasks assessed/performed               Past Medical History:  Diagnosis Date   Asthma    Chronic pancreatitis (HCC)    DDD (degenerative disc disease), lumbosacral    DJD (degenerative joint disease)    Headache    Hyperlipidemia    Hypertension    Hypoglycemia    Obesity    Osteoarthritis    Prolactinoma (HCC)    Sciatica    Sleep apnea    Past Surgical History:  Procedure Laterality Date   CHOLECYSTECTOMY     COLONOSCOPY     CRANIOTOMY N/A 01/04/2022   Procedure: Endonasal endoscopic resection of pituitary tumor;  Surgeon: Cannon Champion, MD;  Location: [facility] OR;  Service: Neurosurgery;  Laterality: N/A;  RM 20   NASAL SINUS SURGERY N/A 01/04/2022   Procedure: ENDOSCOPIC SINUS SURGERY TRANSNASAL PITUITARY REMOVAL WITH NASAL NASOSETTAL FLAP;  Surgeon: Daleen Dubs, DO;  Location: [facility] OR;  Service: ENT;  Laterality: N/A;   Patient Active Problem List   Diagnosis Date Noted   Class 3 severe obesity due to excess calories with body mass index (BMI) of 40.0 to 44.9 in adult 03/30/2023   Hypoglycemia 06/16/2022   Status post transsphenoidal pituitary resection (HCC) 01/04/2022   Low TSH level 10/21/2021   Dyslipidemia 07/15/2021   Osteoarthritis 08/20/2020   Sciatica 07/30/2020   DJD (degenerative joint disease)    Obstructive sleep apnea 03/11/2020   Snoring 02/05/2020   Pneumonia due to COVID-19 virus 12/15/2019   Acute respiratory failure with hypoxia (HCC) 12/15/2019    Hypokalemia 12/15/2019   Asthma without acute exacerbation 12/15/2019   Encounter for management of intrauterine contraceptive device (IUD) 06/09/2019   Essential hypertension 06/09/2019   Fatigue 06/09/2019   Slow transit constipation 06/09/2019   Hyperprolactinemia (HCC) 02/15/2018   Migraine 10/12/2017   Chronic pancreatitis (HCC) 11/09/2016   Prolactinoma (HCC) 11/09/2016   BMI 38.0-38.9,adult 11/09/2016   Candida rash of groin 11/09/2016   Mild intermittent asthma 11/09/2016   Mixed hyperlipidemia 11/09/2016    PCP: Marius Siemens, NP   REFERRING PROVIDER: Rodgers Clack, DO  REFERRING DIAG: [phone] (ICD-10-CM) - Rotator cuff tendinitis, right M17.12 (ICD-10-CM) - Primary osteoarthritis of left knee  THERAPY DIAG:  Unsteadiness on feet  Chronic pain of left knee  Chronic right shoulder pain  Rationale for Evaluation and Treatment: Rehabilitation  ONSET DATE: Acute for shoulder, chronic for knee  SUBJECTIVE:  SUBJECTIVE STATEMENT: Patient reports that she is having a little bit of pain today. Knee feels more limiting today.    Hand dominance: Right  PERTINENT HISTORY: Also with right shoulder shoulder pain for the last 3 weeks.  No injury or trauma. She does note when she gets out of bed she typically pushes up on the right side No radiation of pain No numbness or tingling Previous shoulder issues for which she did physical therapy following COVID hospitalization in 2021 She has started doing some shoulder circles and wall crawls Evaluated at urgent care 10/05/23 -X-rays were unremarkable of the shoulder - Was given a sling - Rx ibuprofen  800 mg 3 times daily as needed.  This has been somewhat helpful - Recommended to follow-up with sports medicine  Patient with left knee pain  since 04/2023 and right shoulder pain x 3 weeks Left medial knee pain without any specific injury or trauma Pain along the medial knee Some occasional clicking and popping Seen in urgent care 10/05/2023 - Urgent care notes reviewed in detail during the visit today - Fitted with a knee brace - X-ray showed mild osteoarthritis Given Rx ibuprofen  800 mg 3 times daily as needed which has been somewhat helpful  PAIN:  Are you having pain? Yes: NPRS scale: 10/10 Pain location: L knee Pain description: ache Aggravating factors: descending stairs, prolonged walking/sitting Relieving factors: heat, topical gels  PAIN:  Are you having pain? Yes: NPRS scale: 10/10 Pain location: R shoulder Pain description: ache, sharp Aggravating factors: OH motions, unexpected motion Relieving factors: massage, heat/ice   PRECAUTIONS: None  RED FLAGS: None   WEIGHT BEARING RESTRICTIONS: No  FALLS:  Has patient fallen in last 6 months? No  OCCUPATION: Office work  PLOF: Independent  PATIENT GOALS:To manage my shoulder and knee pain  NEXT MD VISIT: 6 weeks  OBJECTIVE:  Note: Objective measures were completed at Evaluation unless otherwise noted.  DIAGNOSTIC FINDINGS:  Right shoulder x-ray 10/05/2023 at urgent care personally reviewed and interpreted by me showing: - Mild A/C joint degenerative changes - No other bony abnormalities   Left knee x-ray 10/05/2023 4 views AP/lateral/obliques personally reviewed and interpreted by me today showing: - Mild degenerative changes along the medial and patellofemoral compartments - No other bony abnormalities  PATIENT SURVEYS:  LEFS 16/80  POSTURE: Rounded shoulders  UPPER EXTREMITY ROM:   A/PROM Right eval Left eval  Shoulder flexion 90/160   Shoulder extension    Shoulder abduction 90/160   Shoulder adduction    Shoulder internal rotation    Shoulder external rotation    Elbow flexion    Elbow extension    Wrist flexion    Wrist  extension    Wrist ulnar deviation    Wrist radial deviation    Wrist pronation    Wrist supination    (Blank rows = not tested)  UPPER EXTREMITY MMT:  MMT Right eval Left eval  Shoulder flexion 4-   Shoulder extension    Shoulder abduction 4-   Shoulder adduction    Shoulder internal rotation    Shoulder external rotation    Middle trapezius    Lower trapezius    Elbow flexion    Elbow extension    Wrist flexion    Wrist extension    Wrist ulnar deviation    Wrist radial deviation    Wrist pronation    Wrist supination    Grip strength (lbs)    (Blank rows = not tested)  SHOULDER SPECIAL  TESTS: Impingement tests: Neer impingement test: positive  and Hawkins/Kennedy impingement test: positive  Rotator cuff assessment: Drop arm test: negative, Empty can test: negative, and Hornblower's sign: negative   LOWER EXTREMITY MMT:    MMT Right eval Left eval  Hip flexion    Hip extension    Hip abduction    Hip adduction    Hip internal rotation    Hip external rotation    Knee flexion  4  Knee extension  4-  Ankle dorsiflexion    Ankle plantarflexion    Ankle inversion    Ankle eversion     (Blank rows = not tested)   LOWER EXTREMITY ROM:     Active  Right eval Left eval  Hip flexion    Hip extension    Hip abduction    Hip adduction    Hip internal rotation    Hip external rotation    Knee flexion 125 135  Knee extension    Ankle dorsiflexion    Ankle plantarflexion    Ankle inversion    Ankle eversion     (Blank rows = not tested)   LOWER EXTREMITY SPECIAL TESTS:  Knee special tests: Lateral pull sign: negative and varus/valgus stress negative for laxity  PALPATION:  TTP R biceps tendon, AC joint   Functional testing:  30s chair stand test 0 reps                                                                                                                          TREATMENT DATE:   [facility] Adult PT Treatment:                                                 DATE: 11/14/2023  Therapeutic Exercise: Nustep L3 x 5 minute  Shoulder isometrics 3sec x 15 adduction/abduction  Shoulder mobilizations 3 sec x 15   Therapeutic Activity: Supine press 15x Ranger  Supine OH press 15x L QS 3s 15x L SAQ 15x L SLR 15x  [facility] Adult PT Treatment:                                                DATE: 10/31/23 Therapeutic Exercise: Nustep L1   Therapeutic Activity: Supine press 15x Ranger  Supine OH press 15x L QS 3s 15x L SAQ 15x L SLR 15x  [facility] Adult PT Treatment:                                                DATE: 10/24/23 Eval R shoulder/L knee  PATIENT EDUCATION: Education details: Discussed eval findings, rehab rationale and POC and patient is in agreement  Person educated: Patient Education method: Explanation Education comprehension: verbalized understanding and needs further education  HOME EXERCISE PROGRAM: TBD due to time constraints  ASSESSMENT:   CLINICAL IMPRESSION:   Itzamara had fair tolerance of today's treatment session, which focused on shoulder mobility and LE strengthening activities. She will continue to benefit from progression of current POC as tolerated. She is somewhat limited by pain severity and muscle fatigue. We will continue to progress per POC as tolerated, in order to reach established rehab goals.      Patient is a 48 y.o. female who was seen today for physical therapy evaluation and treatment for chronic R shoulder and L knee pain. Patient presents with limited R shoulder AROM and strength with positive impingement signs but no signs of RC tear.  L knee flexion is mildly limited with quadriceps strength deficit noted as evidenced by lateral patellar tracking.  OBJECTIVE IMPAIRMENTS: Abnormal gait, decreased activity tolerance, decreased endurance, decreased knowledge of condition, decreased knowledge of use of DME, decreased mobility, difficulty walking, decreased ROM, decreased strength, impaired  perceived functional ability, impaired UE functional use, postural dysfunction, obesity, and pain.   ACTIVITY LIMITATIONS: carrying, lifting, sitting, standing, squatting, stairs, dressing, reach over head, and locomotion level  PERSONAL FACTORS: Age, Behavior pattern, Fitness, Past/current experiences, Time since onset of injury/illness/exacerbation, and 1 comorbidity: Hx of long Covid are also affecting patient's functional outcome.   REHAB POTENTIAL: Good  CLINICAL DECISION MAKING: Stable/uncomplicated  EVALUATION COMPLEXITY: Moderate   GOALS: Goals reviewed with patient? No  SHORT TERM GOALS: Target date: 11/14/2023    Patient to demonstrate independence in HEP  Baseline: TBD due to time constraints Goal status: INITIAL  2.  Patient will increase AROM R shoulder to 160d flexion an abduction Baseline:  A/PROM Right eval Left eval  Shoulder flexion 90/160   Shoulder extension    Shoulder abduction 90/160    Goal status: INITIAL  3.  Increase L knee flexion to 130d Baseline: 125d Goal status: INITIAL    LONG TERM GOALS: Target date: 12/05/2023    Patient will acknowledge 6/10 pain at least once during episode of care  in L knee and R shoulder Baseline: 10/10 Goal status: INITIAL  2.  Patient will score at least 24/80 on LEFS to signify clinically meaningful improvement in functional abilities.   Baseline: 16/80 Goal status: INITIAL  3.  Patient will increase 30s chair stand reps from 0 to 5 without arms to demonstrate and improved functional ability with less pain/difficulty as well as reduce fall risk.  Baseline:  Goal status: INITIAL  4.  Increase R shoulder strength to 4/5 Baseline:  MMT Right eval Left eval  Shoulder flexion 4-   Shoulder extension    Shoulder abduction 4-    Goal status: INITIAL  5.  Increase L knee strength to 4+/5 Baseline:  MMT Right eval Left eval  Hip flexion    Hip extension    Hip abduction    Hip adduction    Hip  internal rotation    Hip external rotation    Knee flexion  4  Knee extension  4-   Goal status: INITIAL    PLAN:  PT FREQUENCY: 1-2x/week  PT DURATION: 6 weeks  PLANNED INTERVENTIONS: 97110-Therapeutic exercises, 97530- Therapeutic activity, [id number]- Neuromuscular re-education, 97535- Self Care, [id number]- Manual therapy, [phone]- Gait training, and Patient/Family education  PLAN FOR NEXT SESSION: HEP review  and update, manual techniques as appropriate, aerobic tasks, ROM and flexibility activities, strengthening and PREs, TPDN, gait and balance training as needed     Arlester Bence, PT, DPT  11/15/2023 4:44 PM

## 2023-11-15 DIAGNOSIS — G4733 Obstructive sleep apnea (adult) (pediatric): Secondary | ICD-10-CM | POA: Diagnosis not present

## 2023-11-16 ENCOUNTER — Other Ambulatory Visit: Payer: Self-pay

## 2023-11-16 ENCOUNTER — Encounter: Payer: Self-pay | Admitting: Physical Therapy

## 2023-11-16 ENCOUNTER — Ambulatory Visit: Admitting: Physical Therapy

## 2023-11-16 DIAGNOSIS — E782 Mixed hyperlipidemia: Secondary | ICD-10-CM

## 2023-11-16 DIAGNOSIS — Z789 Other specified health status: Secondary | ICD-10-CM

## 2023-11-16 DIAGNOSIS — J45909 Unspecified asthma, uncomplicated: Secondary | ICD-10-CM

## 2023-11-16 DIAGNOSIS — M170 Bilateral primary osteoarthritis of knee: Secondary | ICD-10-CM

## 2023-11-16 DIAGNOSIS — M25511 Pain in right shoulder: Secondary | ICD-10-CM | POA: Diagnosis not present

## 2023-11-16 DIAGNOSIS — R2681 Unsteadiness on feet: Secondary | ICD-10-CM | POA: Diagnosis not present

## 2023-11-16 DIAGNOSIS — G8929 Other chronic pain: Secondary | ICD-10-CM | POA: Diagnosis not present

## 2023-11-16 DIAGNOSIS — M25562 Pain in left knee: Secondary | ICD-10-CM | POA: Diagnosis not present

## 2023-11-16 DIAGNOSIS — I10 Essential (primary) hypertension: Secondary | ICD-10-CM

## 2023-11-16 NOTE — Therapy (Signed)
 OUTPATIENT PHYSICAL THERAPY TREATMENT NOTE   Patient Name: Beth [name] MRN: [id number] DOB:28-Oct-1975, 48 y.o., female Today's Date: 11/16/2023  END OF SESSION:  PT End of Session - 11/16/23 1004     Visit Number 4    Number of Visits 12    Date for PT Re-Evaluation 12/24/22    Authorization Type BCBS    PT Start Time 1004   late check in   PT Stop Time 1043    PT Time Calculation (min) 39 min    Activity Tolerance Patient tolerated treatment well             PT End of Session - 11/16/23 1004     Visit Number 4    Number of Visits 12    Date for PT Re-Evaluation 12/24/22    Authorization Type BCBS    PT Start Time 1004   late check in   PT Stop Time 1043    PT Time Calculation (min) 39 min    Activity Tolerance Patient tolerated treatment well                Past Medical History:  Diagnosis Date   Asthma    Chronic pancreatitis (HCC)    DDD (degenerative disc disease), lumbosacral    DJD (degenerative joint disease)    Headache    Hyperlipidemia    Hypertension    Hypoglycemia    Obesity    Osteoarthritis    Prolactinoma (HCC)    Sciatica    Sleep apnea    Past Surgical History:  Procedure Laterality Date   CHOLECYSTECTOMY     COLONOSCOPY     CRANIOTOMY N/A 01/04/2022   Procedure: Endonasal endoscopic resection of pituitary tumor;  Surgeon: Cannon Champion, MD;  Location: [facility] OR;  Service: Neurosurgery;  Laterality: N/A;  RM 20   NASAL SINUS SURGERY N/A 01/04/2022   Procedure: ENDOSCOPIC SINUS SURGERY TRANSNASAL PITUITARY REMOVAL WITH NASAL NASOSETTAL FLAP;  Surgeon: Daleen Dubs, DO;  Location: [facility] OR;  Service: ENT;  Laterality: N/A;   Patient Active Problem List   Diagnosis Date Noted   Class 3 severe obesity due to excess calories with body mass index (BMI) of 40.0 to 44.9 in adult 03/30/2023   Hypoglycemia 06/16/2022   Status post transsphenoidal pituitary resection (HCC) 01/04/2022   Low TSH level 10/21/2021   Dyslipidemia  07/15/2021   Osteoarthritis 08/20/2020   Sciatica 07/30/2020   DJD (degenerative joint disease)    Obstructive sleep apnea 03/11/2020   Snoring 02/05/2020   Pneumonia due to COVID-19 virus 12/15/2019   Acute respiratory failure with hypoxia (HCC) 12/15/2019   Hypokalemia 12/15/2019   Asthma without acute exacerbation 12/15/2019   Encounter for management of intrauterine contraceptive device (IUD) 06/09/2019   Essential hypertension 06/09/2019   Fatigue 06/09/2019   Slow transit constipation 06/09/2019   Hyperprolactinemia (HCC) 02/15/2018   Migraine 10/12/2017   Chronic pancreatitis (HCC) 11/09/2016   Prolactinoma (HCC) 11/09/2016   BMI 38.0-38.9,adult 11/09/2016   Candida rash of groin 11/09/2016   Mild intermittent asthma 11/09/2016   Mixed hyperlipidemia 11/09/2016    PCP: Marius Siemens, NP   REFERRING PROVIDER: Rodgers Clack, DO  REFERRING DIAG: [phone] (ICD-10-CM) - Rotator cuff tendinitis, right M17.12 (ICD-10-CM) - Primary osteoarthritis of left knee  THERAPY DIAG:  Unsteadiness on feet  Chronic pain of left knee  Chronic right shoulder pain  Rationale for Evaluation and Treatment: Rehabilitation  ONSET DATE: Acute for shoulder, chronic for knee  SUBJECTIVE:  SUBJECTIVE STATEMENT: 11/16/2023 just a bit tired today. Mildly sore w/ shoulder after last session, but overall doing better. Describes more mobility in shoulder, less pain. Mild tenderness in R shoulder today rated at 3/10, knee 2/10.     Hand dominance: Right  PERTINENT HISTORY: Also with right shoulder shoulder pain for the last 3 weeks.  No injury or trauma. She does note when she gets out of bed she typically pushes up on the right side No radiation of pain No numbness or tingling Previous shoulder issues for  which she did physical therapy following COVID hospitalization in 2021 She has started doing some shoulder circles and wall crawls Evaluated at urgent care 10/05/23 -X-rays were unremarkable of the shoulder - Was given a sling - Rx ibuprofen  800 mg 3 times daily as needed.  This has been somewhat helpful - Recommended to follow-up with sports medicine  Patient with left knee pain since 04/2023 and right shoulder pain x 3 weeks Left medial knee pain without any specific injury or trauma Pain along the medial knee Some occasional clicking and popping Seen in urgent care 10/05/2023 - Urgent care notes reviewed in detail during the visit today - Fitted with a knee brace - X-ray showed mild osteoarthritis Given Rx ibuprofen  800 mg 3 times daily as needed which has been somewhat helpful  PAIN:  Are you having pain? Yes: NPRS scale: see subjective/10 Pain location: L knee Pain description: ache Aggravating factors: descending stairs, prolonged walking/sitting Relieving factors: heat, topical gels  PAIN:  Are you having pain? Yes: NPRS scale: 10/10 Pain location: R shoulder Pain description: ache, sharp Aggravating factors: OH motions, unexpected motion Relieving factors: massage, heat/ice   PRECAUTIONS: None  RED FLAGS: None   WEIGHT BEARING RESTRICTIONS: No  FALLS:  Has patient fallen in last 6 months? No  OCCUPATION: Office work  PLOF: Independent  PATIENT GOALS:To manage my shoulder and knee pain  NEXT MD VISIT: 6 weeks  OBJECTIVE:  Note: Objective measures were completed at Evaluation unless otherwise noted.  DIAGNOSTIC FINDINGS:  Right shoulder x-ray 10/05/2023 at urgent care personally reviewed and interpreted by me showing: - Mild A/C joint degenerative changes - No other bony abnormalities   Left knee x-ray 10/05/2023 4 views AP/lateral/obliques personally reviewed and interpreted by me today showing: - Mild degenerative changes along the medial and  patellofemoral compartments - No other bony abnormalities  PATIENT SURVEYS:  LEFS 16/80  POSTURE: Rounded shoulders  UPPER EXTREMITY ROM:   A/PROM Right eval Left eval  Shoulder flexion 90/160   Shoulder extension    Shoulder abduction 90/160   Shoulder adduction    Shoulder internal rotation    Shoulder external rotation    Elbow flexion    Elbow extension    Wrist flexion    Wrist extension    Wrist ulnar deviation    Wrist radial deviation    Wrist pronation    Wrist supination    (Blank rows = not tested)  UPPER EXTREMITY MMT:  MMT Right eval Left eval  Shoulder flexion 4-   Shoulder extension    Shoulder abduction 4-   Shoulder adduction    Shoulder internal rotation    Shoulder external rotation    Middle trapezius    Lower trapezius    Elbow flexion    Elbow extension    Wrist flexion    Wrist extension    Wrist ulnar deviation    Wrist radial deviation    Wrist pronation  Wrist supination    Grip strength (lbs)    (Blank rows = not tested)  SHOULDER SPECIAL TESTS: Impingement tests: Neer impingement test: positive  and Hawkins/Kennedy impingement test: positive  Rotator cuff assessment: Drop arm test: negative, Empty can test: negative, and Hornblower's sign: negative   LOWER EXTREMITY MMT:    MMT Right eval Left eval  Hip flexion    Hip extension    Hip abduction    Hip adduction    Hip internal rotation    Hip external rotation    Knee flexion  4  Knee extension  4-  Ankle dorsiflexion    Ankle plantarflexion    Ankle inversion    Ankle eversion     (Blank rows = not tested)   LOWER EXTREMITY ROM:     Active  Right eval Left eval  Hip flexion    Hip extension    Hip abduction    Hip adduction    Hip internal rotation    Hip external rotation    Knee flexion 125 135  Knee extension    Ankle dorsiflexion    Ankle plantarflexion    Ankle inversion    Ankle eversion     (Blank rows = not tested)   LOWER EXTREMITY  SPECIAL TESTS:  Knee special tests: Lateral pull sign: negative and varus/valgus stress negative for laxity  PALPATION:  TTP R biceps tendon, AC joint   Functional testing:  30s chair stand test 0 reps                                                                                                                          TREATMENT DATE:   [facility] Adult PT Treatment:                                                DATE: 11/16/23 Therapeutic Exercise: Nu step L6, LE/UE Shoulder iso ER/IR x5 BIL at rack Shoulder flexion walkback at squat rack x5 w 5-10sec hold HEP update + education/handout   Therapeutic Activity: STS + OH press (hands clasped) 2x8  Heel raises 2x12 BIL     [facility] Adult PT Treatment:                                                DATE: 11/14/2023  Therapeutic Exercise: Nustep L3 x 5 minute  Shoulder isometrics 3sec x 15 adduction/abduction  Shoulder mobilizations 3 sec x 15   Therapeutic Activity: Supine press 15x Ranger  Supine OH press 15x L QS 3s 15x L SAQ 15x L SLR 15x  [facility] Adult PT Treatment:  DATE: 10/31/23 Therapeutic Exercise: Nustep L1   Therapeutic Activity: Supine press 15x Ranger  Supine OH press 15x L QS 3s 15x L SAQ 15x L SLR 15x  [facility] Adult PT Treatment:                                                DATE: 10/24/23 Eval R shoulder/L knee   PATIENT EDUCATION: Education details: Discussed eval findings, rehab rationale and POC and patient is in agreement  Person educated: Patient Education method: Explanation Education comprehension: verbalized understanding and needs further education  HOME EXERCISE PROGRAM: Access Code: [email or web address] URL: [email or web address] Date: 11/16/2023 Prepared by: Mayme Spearman  Exercises - Standing Isometric Shoulder External Rotation with Doorway  - 2-3 x daily - 7 x weekly - 1 sets - 5 reps - Standing Isometric Shoulder Internal Rotation at  Doorway  - 2-3 x daily - 7 x weekly - 1 sets - 5 reps - Standing 'L' Stretch at Counter  - 2-3 x daily - 7 x weekly - 1 sets - 5 reps  ASSESSMENT:   CLINICAL IMPRESSION:   11/16/2023 Pt arrives w/ report of improving symptoms, some fatigue/soreness today. Despite this her tolerance appears to have improved today, no acute increases in pain with activity but does endorse some soreness + muscle fatigue as expected. Progression as above and HEP update to include isometrics. No adverse events. Recommend continuing along current POC in order to address relevant deficits and improve functional tolerance. Pt departs today's session in no acute distress, all voiced questions/concerns addressed appropriately from PT perspective.      Per eval - Patient is a 48 y.o. female who was seen today for physical therapy evaluation and treatment for chronic R shoulder and L knee pain. Patient presents with limited R shoulder AROM and strength with positive impingement signs but no signs of RC tear.  L knee flexion is mildly limited with quadriceps strength deficit noted as evidenced by lateral patellar tracking.  OBJECTIVE IMPAIRMENTS: Abnormal gait, decreased activity tolerance, decreased endurance, decreased knowledge of condition, decreased knowledge of use of DME, decreased mobility, difficulty walking, decreased ROM, decreased strength, impaired perceived functional ability, impaired UE functional use, postural dysfunction, obesity, and pain.   ACTIVITY LIMITATIONS: carrying, lifting, sitting, standing, squatting, stairs, dressing, reach over head, and locomotion level  PERSONAL FACTORS: Age, Behavior pattern, Fitness, Past/current experiences, Time since onset of injury/illness/exacerbation, and 1 comorbidity: Hx of long Covid are also affecting patient's functional outcome.   REHAB POTENTIAL: Good  CLINICAL DECISION MAKING: Stable/uncomplicated  EVALUATION COMPLEXITY: Moderate   GOALS: Goals reviewed  with patient? No  SHORT TERM GOALS: Target date: 11/14/2023    Patient to demonstrate independence in HEP  Baseline: TBD due to time constraints 11/16/23: reports good HEP adherence Goal status: MET  2.  Patient will increase AROM R shoulder to 160d flexion an abduction Baseline:  A/PROM Right eval Left eval  Shoulder flexion 90/160   Shoulder extension    Shoulder abduction 90/160    11/16/23: gross observation ~140 deg AAROM against gravity Goal status: ONGOING  3.  Increase L knee flexion to 130d Baseline: 125d Goal status: ONGOING    LONG TERM GOALS: Target date: 12/05/2023    Patient will acknowledge 6/10 pain at least once during episode of care  in L knee and R shoulder Baseline: 10/10 Goal status: INITIAL  2.  Patient will score at least 24/80 on LEFS to signify clinically meaningful improvement in functional abilities.   Baseline: 16/80 Goal status: INITIAL  3.  Patient will increase 30s chair stand reps from 0 to 5 without arms to demonstrate and improved functional ability with less pain/difficulty as well as reduce fall risk.  Baseline:  Goal status: INITIAL  4.  Increase R shoulder strength to 4/5 Baseline:  MMT Right eval Left eval  Shoulder flexion 4-   Shoulder extension    Shoulder abduction 4-    Goal status: INITIAL  5.  Increase L knee strength to 4+/5 Baseline:  MMT Right eval Left eval  Hip flexion    Hip extension    Hip abduction    Hip adduction    Hip internal rotation    Hip external rotation    Knee flexion  4  Knee extension  4-   Goal status: INITIAL    PLAN:  PT FREQUENCY: 1-2x/week  PT DURATION: 6 weeks  PLANNED INTERVENTIONS: 97110-Therapeutic exercises, 97530- Therapeutic activity, 97112- Neuromuscular re-education, 97535- Self Care, 40981- Manual therapy, [phone]- Gait training, and Patient/Family education  PLAN FOR NEXT SESSION: HEP review and update, manual techniques as appropriate, aerobic tasks, ROM and  flexibility activities, strengthening and PREs, TPDN, gait and balance training as needed     Lovett Ruck PT, DPT 11/16/2023 10:48 AM

## 2023-11-16 NOTE — Patient Outreach (Signed)
 Complex Care Management   Visit Note  11/16/2023  Name:  Beth [name] MRN: [id number] DOB: 11-02-1975  Situation: Referral received for Complex Care Management related to HDL I obtained verbal consent from Patient.  Visit completed with patient  on the phone  Background:   Past Medical History:  Diagnosis Date   Asthma    Chronic pancreatitis (HCC)    DDD (degenerative disc disease), lumbosacral    DJD (degenerative joint disease)    Headache    Hyperlipidemia    Hypertension    Hypoglycemia    Obesity    Osteoarthritis    Prolactinoma (HCC)    Sciatica    Sleep apnea     Assessment: Patient Reported Symptoms:  Cognitive Cognitive Status: Able to follow simple commands, Alert and oriented to person, place, and time, Normal speech and language skills      Neurological Neurological Review of Symptoms: Dizziness, Headaches Neurological Conditions:  (Brain surgery) Neurological Management Strategies:  (nasal rinse)  HEENT HEENT Symptoms Reported: Change or loss of hearing (left ear loss) HEENT Conditions: Ear problem(s) Ear problem(s)  Cardiovascular Cardiovascular Symptoms Reported: No symptoms reported    Respiratory Respiratory Symptoms Reported: Shortness of breath Respiratory Conditions: Seasonal allergies, Shortness of breath  Endocrine Patient reports the following symptoms related to hypoglycemia or hyperglycemia : No symptoms reported    Gastrointestinal Gastrointestinal Symptoms Reported: Constipation Gastrointestinal Conditions: Constipation Gastrointestinal Management Strategies:  (increase of water)    Genitourinary Genitourinary Symptoms Reported: No symptoms reported    Integumentary   Skin Conditions: Itching (Athletes foot) Skin Management Strategies: Medication therapy  Musculoskeletal Musculoskelatal Symptoms Reviewed: Difficulty walking, Weakness Musculoskeletal Conditions: Joint pain, Back pain, Osteoarthritis, Unsteady gait Musculoskeletal  Management Strategies: Medical device, Medication therapy Falls in the past year?: No    Psychosocial       Quality of Family Relationships: supportive Do you feel physically threatened by others?: No      11/16/2023   11:29 AM  Depression screen PHQ 2/9  Decreased Interest 1  Down, Depressed, Hopeless 1  PHQ - 2 Score 2  Altered sleeping 2  Change in appetite 2  Feeling bad or failure about yourself  2  Trouble concentrating 1  Moving slowly or fidgety/restless 0  Suicidal thoughts 0  PHQ-9 Score 9  Difficult doing work/chores Somewhat difficult    There were no vitals filed for this visit.  Medications Reviewed Today     Reviewed by Augustin Leber, RN (Registered Nurse) on 11/16/23 at 1117  Med List Status: <None>   Medication Order Taking? Sig Documenting Provider Last Dose Status Informant  albuterol  (VENTOLIN  HFA) 108 (90 Base) MCG/ACT inhaler [id number] Yes Inhale 2 puffs into the lungs every 6 (six) hours as needed for wheezing or shortness of breath. Denson Flake, MD Taking Active   amoxicillin  (AMOXIL ) 875 MG tablet [id number] No Take 1 tablet (875 mg total) by mouth 2 (two) times daily.  Patient not taking: Reported on 11/16/2023   Buena Carmine, NP Not Taking Active   Ascorbic Acid  (VITAMIN C) 1000 MG tablet [id number] Yes Take 1,000 mg by mouth daily. [provider] Taking Active Self  benzonatate  (TESSALON ) 200 MG capsule [id number] Yes Take 1 capsule (200 mg total) by mouth 3 (three) times daily as needed for cough. Denson Flake, MD Taking Active   Butenafine  HCl (MENTAX) 1 % cream [id number] Yes Apply 1 application topically 2 (two) times daily.  Patient taking differently: Apply 1 application  topically as needed.  Marius Siemens, NP Taking Active Self  Cholecalciferol (VITAMIN D3 PO) [id number] Yes Take 2,000 mg by mouth daily. On Hold [provider] Taking Active Self  Docusate Sodium  (COLACE PO) [id number] Yes Take 1 capsule by  mouth daily. [provider] Taking Active Self  EPINEPHrine  0.3 mg/0.3 mL IJ SOAJ injection [id number] No Inject 0.3 mg into the muscle as needed for anaphylaxis. [provider] Unknown Active Self           Med Note Mauri Sous Dec 26, 2021  9:08 AM) Pt needs a refill, current pen is expired  ergocalciferol  (VITAMIN D2) 1.25 MG (50000 UT) capsule [id number] No Take 1 capsule (50,000 Units total) by mouth once a week.  Patient not taking: Reported on 11/16/2023   Marius Siemens, NP Not Taking Active   fenofibrate  (TRICOR ) 145 MG tablet [id number] Yes Take 1 tablet (145 mg total) by mouth daily. Marius Siemens, NP Taking Active   fluticasone  (FLONASE ) 50 MCG/ACT nasal spray [id number] Yes Place 2 sprays into both nostrils daily. Denson Flake, MD Taking Active Self  ibuprofen  (ADVIL ) 800 MG tablet [id number] Yes Take 1 tablet (800 mg total) by mouth 3 (three) times daily. Reddick, Arcadio Knuckles B, NP Taking Active   ipratropium-albuterol  (DUONEB) 0.5-2.5 (3) MG/3ML SOLN [id number] Yes Take 3 mLs by nebulization every 6 (six) hours as needed (shortness of breath, wheezing, and persistent cough). Buena Carmine, NP Taking Active   levonorgestrel  (MIRENA ) 20 MCG/24HR IUD [id number] Yes 1 each by Intrauterine route once.  [provider] Taking Active Self  LORazepam  (ATIVAN ) 1 MG tablet [id number] Yes Take 1 tablet (1 mg total) by mouth every 8 (eight) hours as needed for anxiety (mri). Vaslow, Zachary K, MD Taking Active   Menthol, Topical Analgesic, (BIOFREEZE) 10 % CREA [id number]  Apply 1 Application topically daily as needed (pain). [provider]  Active Self  ondansetron  (ZOFRAN -ODT) 8 MG disintegrating tablet [id number] Yes Take 1 tablet (8 mg total) by mouth every 8 (eight) hours as needed for nausea or vomiting. Colie Dawes, MD Taking Active   oseltamivir  (TAMIFLU ) 75 MG capsule [id number] No Take 1 capsule (75 mg total) by mouth 2 (two) times  daily.  Patient not taking: Reported on 11/16/2023   Buena Carmine, NP Not Taking Active   Pancrelipase , Lip-Prot-Amyl, (CREON ) 24000-76000 units CPEP [id number] No Take 2 capsules by mouth See admin instructions. Take 2 capsules three times daily with food and 1 capsule twice daily with snacks.  Patient not taking: Reported on 11/16/2023   [provider] Not Taking Active Self  pantoprazole  (PROTONIX ) 20 MG tablet [id number] Yes Take 20 mg by mouth daily. [provider] Taking Active Self  promethazine -dextromethorphan  (PROMETHAZINE -DM) 6.25-15 MG/5ML syrup [id number] Yes Take 5 mLs by mouth 3 (three) times daily as needed for cough. Buena Carmine, NP Taking Active   psyllium (REGULOID) 0.52 g capsule [id number] Yes Take 0.52 capsules by mouth as needed. [provider] Taking Active Self  rosuvastatin  (CRESTOR ) 40 MG tablet [id number]  Take 1 tablet (40 mg total) by mouth daily. Marius Siemens, NP  Active   Zinc  Sulfate (ZINC  15 PO) [id number] Yes Take 15 mg by mouth daily. [provider] Taking Active Self            Recommendation:   PCP Follow-up  Follow Up Plan:   Telephone follow up appointment with care management team member scheduled for:  12/21/23  11 am  Augustin Leber RN, BSN, [facility]  [facility], [facility]  Care Coordinator Phone: [phone]

## 2023-11-16 NOTE — Patient Instructions (Signed)
 Visit Information  Thank you for taking time to visit with me today. Please don't hesitate to contact me if I can be of assistance to you before our next scheduled appointment.  Your next care management appointment is scheduled for:  12/21/23  11 am  Please call the care guide team at [phone] if you need to cancel, schedule, or reschedule an appointment.   Please call [phone]-TALK (toll free, 24 hour hotline) if you are experiencing a Mental Health or Behavioral Health Crisis or need someone to talk to.  Augustin Leber RN, BSN, [facility]  [facility], [facility]  Care Coordinator Phone: [phone]

## 2023-11-20 ENCOUNTER — Ambulatory Visit

## 2023-11-20 DIAGNOSIS — G8929 Other chronic pain: Secondary | ICD-10-CM | POA: Diagnosis not present

## 2023-11-20 DIAGNOSIS — M25562 Pain in left knee: Secondary | ICD-10-CM | POA: Diagnosis not present

## 2023-11-20 DIAGNOSIS — M25511 Pain in right shoulder: Secondary | ICD-10-CM | POA: Diagnosis not present

## 2023-11-20 DIAGNOSIS — R2681 Unsteadiness on feet: Secondary | ICD-10-CM | POA: Diagnosis not present

## 2023-11-20 NOTE — Therapy (Signed)
 OUTPATIENT PHYSICAL THERAPY TREATMENT NOTE   Patient Name: Beth [name] MRN: [id number] DOB:[birth year], 48 y.o., female Today's Date: 11/20/2023  END OF SESSION:         Past Medical History:  Diagnosis Date   Asthma    Chronic pancreatitis (HCC)    DDD (degenerative disc disease), lumbosacral    DJD (degenerative joint disease)    Headache    Hyperlipidemia    Hypertension    Hypoglycemia    Obesity    Osteoarthritis    Prolactinoma (HCC)    Sciatica    Sleep apnea    Past Surgical History:  Procedure Laterality Date   CHOLECYSTECTOMY     COLONOSCOPY     CRANIOTOMY N/A 01/04/2022   Procedure: Endonasal endoscopic resection of pituitary tumor;  Surgeon: Cannon Champion, MD;  Location: [facility] OR;  Service: Neurosurgery;  Laterality: N/A;  RM 20   NASAL SINUS SURGERY N/A 01/04/2022   Procedure: ENDOSCOPIC SINUS SURGERY TRANSNASAL PITUITARY REMOVAL WITH NASAL NASOSETTAL FLAP;  Surgeon: Daleen Dubs, DO;  Location: [facility] OR;  Service: ENT;  Laterality: N/A;   Patient Active Problem List   Diagnosis Date Noted   Class 3 severe obesity due to excess calories with body mass index (BMI) of 40.0 to 44.9 in adult 03/30/2023   Hypoglycemia 06/16/2022   Status post transsphenoidal pituitary resection (HCC) 01/04/2022   Low TSH level 10/21/2021   Dyslipidemia 07/15/2021   Osteoarthritis 08/20/2020   Sciatica 07/30/2020   DJD (degenerative joint disease)    Obstructive sleep apnea 03/11/2020   Snoring 02/05/2020   Pneumonia due to COVID-19 virus 12/15/2019   Acute respiratory failure with hypoxia (HCC) 12/15/2019   Hypokalemia 12/15/2019   Asthma without acute exacerbation 12/15/2019   Encounter for management of intrauterine contraceptive device (IUD) 06/09/2019   Essential hypertension 06/09/2019   Fatigue 06/09/2019   Slow transit constipation 06/09/2019   Hyperprolactinemia (HCC) 02/15/2018   Migraine 10/12/2017   Chronic pancreatitis (HCC) 11/09/2016    Prolactinoma (HCC) 11/09/2016   BMI 38.0-38.9,adult 11/09/2016   Candida rash of groin 11/09/2016   Mild intermittent asthma 11/09/2016   Mixed hyperlipidemia 11/09/2016    PCP: Marius Siemens, NP   REFERRING PROVIDER: Rodgers Clack, DO  REFERRING DIAG: [phone] (ICD-10-CM) - Rotator cuff tendinitis, right M17.12 (ICD-10-CM) - Primary osteoarthritis of left knee  THERAPY DIAG:  No diagnosis found.  Rationale for Evaluation and Treatment: Rehabilitation  ONSET DATE: Acute for shoulder, chronic for knee  SUBJECTIVE:  SUBJECTIVE STATEMENT: 11/20/2023 Patient reports to PT with L knee brace, and R shoulder sling. She was cleaning the bleachers and school and "lost her footing," then fell down about 8 levels of bleachers. She feels that she did twist or sprain her right ankle during the fall. She states that her right thumb is also hurting. She did not see a MD since she is scheduled to return to ortho on 5/29.  She denies hitting her head or LOC.   She states that her right ankle, L knee (4/10), R shoulder (7/1), R thumb, Lower back (3-4/10), L ankle are bothering her in that order.    Hand dominance: Right  PERTINENT HISTORY: Also with right shoulder shoulder pain for the last 3 weeks.  No injury or trauma. She does note when she gets out of bed she typically pushes up on the right side No radiation of pain No numbness or tingling Previous shoulder issues for which she did physical therapy following COVID hospitalization in 2021 She has started doing some shoulder circles and wall crawls Evaluated at urgent care 10/05/23 -X-rays were unremarkable of the shoulder - Was given a sling - Rx ibuprofen  800 mg 3 times daily as needed.  This has been somewhat helpful - Recommended to follow-up with sports  medicine  Patient with left knee pain since 04/2023 and right shoulder pain x 3 weeks Left medial knee pain without any specific injury or trauma Pain along the medial knee Some occasional clicking and popping Seen in urgent care 10/05/2023 - Urgent care notes reviewed in detail during the visit today - Fitted with a knee brace - X-ray showed mild osteoarthritis Given Rx ibuprofen  800 mg 3 times daily as needed which has been somewhat helpful  PAIN:  Are you having pain? Yes: NPRS scale: see subjective/10 Pain location: L knee Pain description: ache Aggravating factors: descending stairs, prolonged walking/sitting Relieving factors: heat, topical gels  PAIN:  Are you having pain? Yes: NPRS scale: 10/10 Pain location: R shoulder Pain description: ache, sharp Aggravating factors: OH motions, unexpected motion Relieving factors: massage, heat/ice   PRECAUTIONS: None  RED FLAGS: None   WEIGHT BEARING RESTRICTIONS: No  FALLS:  Has patient fallen in last 6 months? No  OCCUPATION: Office work  PLOF: Independent  PATIENT GOALS:To manage my shoulder and knee pain  NEXT MD VISIT: 6 weeks  OBJECTIVE:  Note: Objective measures were completed at Evaluation unless otherwise noted.  DIAGNOSTIC FINDINGS:  Right shoulder x-ray 10/05/2023 at urgent care personally reviewed and interpreted by me showing: - Mild A/C joint degenerative changes - No other bony abnormalities   Left knee x-ray 10/05/2023 4 views AP/lateral/obliques personally reviewed and interpreted by me today showing: - Mild degenerative changes along the medial and patellofemoral compartments - No other bony abnormalities  PATIENT SURVEYS:  LEFS 16/80  POSTURE: Rounded shoulders  UPPER EXTREMITY ROM:   A/PROM Right eval Left eval  Shoulder flexion 90/160   Shoulder extension    Shoulder abduction 90/160   Shoulder adduction    Shoulder internal rotation    Shoulder external rotation    Elbow flexion     Elbow extension    Wrist flexion    Wrist extension    Wrist ulnar deviation    Wrist radial deviation    Wrist pronation    Wrist supination    (Blank rows = not tested)  UPPER EXTREMITY MMT:  MMT Right eval Left eval  Shoulder flexion 4-   Shoulder extension  Shoulder abduction 4-   Shoulder adduction    Shoulder internal rotation    Shoulder external rotation    Middle trapezius    Lower trapezius    Elbow flexion    Elbow extension    Wrist flexion    Wrist extension    Wrist ulnar deviation    Wrist radial deviation    Wrist pronation    Wrist supination    Grip strength (lbs)    (Blank rows = not tested)  SHOULDER SPECIAL TESTS: Impingement tests: Neer impingement test: positive  and Hawkins/Kennedy impingement test: positive  Rotator cuff assessment: Drop arm test: negative, Empty can test: negative, and Hornblower's sign: negative   LOWER EXTREMITY MMT:    MMT Right eval Left eval  Hip flexion    Hip extension    Hip abduction    Hip adduction    Hip internal rotation    Hip external rotation    Knee flexion  4  Knee extension  4-  Ankle dorsiflexion    Ankle plantarflexion    Ankle inversion    Ankle eversion     (Blank rows = not tested)   LOWER EXTREMITY ROM:     Active  Right eval Left eval  Hip flexion    Hip extension    Hip abduction    Hip adduction    Hip internal rotation    Hip external rotation    Knee flexion 125 135  Knee extension    Ankle dorsiflexion    Ankle plantarflexion    Ankle inversion    Ankle eversion     (Blank rows = not tested)   LOWER EXTREMITY SPECIAL TESTS:  Knee special tests: Lateral pull sign: negative and varus/valgus stress negative for laxity  PALPATION:  TTP R biceps tendon, AC joint   Functional testing:  30s chair stand test 0 reps                                                                                                                          TREATMENT DATE:    [facility]  Adult PT Treatment:                                                DATE: 11/16/23 Therapeutic Exercise: Nu step L6, LE/UE Shoulder iso ER/IR x5 BIL at rack Shoulder flexion walkback at squat rack x5 w 5-10sec hold HEP update + education/handout   Therapeutic Activity: STS + OH press (hands clasped) 2x8  Heel raises 2x12 BIL  ***  [facility] Adult PT Treatment:  DATE: 11/16/23 Therapeutic Exercise: Nu step L6, LE/UE Shoulder iso ER/IR x5 BIL at rack Shoulder flexion walkback at squat rack x5 w 5-10sec hold HEP update + education/handout   Therapeutic Activity: STS + OH press (hands clasped) 2x8  Heel raises 2x12 BIL     [facility] Adult PT Treatment:                                                DATE: 11/14/2023  Therapeutic Exercise: Nustep L3 x 5 minute  Shoulder isometrics 3sec x 15 adduction/abduction  Shoulder mobilizations 3 sec x 15   Therapeutic Activity: Supine press 15x Ranger  Supine OH press 15x L QS 3s 15x L SAQ 15x L SLR 15x  [facility] Adult PT Treatment:                                                DATE: 10/31/23 Therapeutic Exercise: Nustep L1   Therapeutic Activity: Supine press 15x Ranger  Supine OH press 15x L QS 3s 15x L SAQ 15x L SLR 15x  [facility] Adult PT Treatment:                                                DATE: 10/24/23 Eval R shoulder/L knee   PATIENT EDUCATION: Education details: Discussed eval findings, rehab rationale and POC and patient is in agreement  Person educated: Patient Education method: Explanation Education comprehension: verbalized understanding and needs further education  HOME EXERCISE PROGRAM: Access Code: [email or web address] URL: [email or web address] Date: 11/16/2023 Prepared by: Mayme Spearman  Exercises - Standing Isometric Shoulder External Rotation with Doorway  - 2-3 x daily - 7 x weekly - 1 sets - 5 reps - Standing Isometric Shoulder Internal Rotation at Doorway   - 2-3 x daily - 7 x weekly - 1 sets - 5 reps - Standing 'L' Stretch at Counter  - 2-3 x daily - 7 x weekly - 1 sets - 5 reps  ASSESSMENT:   CLINICAL IMPRESSION:   11/20/2023 Pt arrives w/ report of improving symptoms, some fatigue/soreness today. Despite this her tolerance appears to have improved today, no acute increases in pain with activity but does endorse some soreness + muscle fatigue as expected. Progression as above and HEP update to include isometrics. No adverse events. Recommend continuing along current POC in order to address relevant deficits and improve functional tolerance. Pt departs today's session in no acute distress, all voiced questions/concerns addressed appropriately from PT perspective.      Per eval - Patient is a 48 y.o. female who was seen today for physical therapy evaluation and treatment for chronic R shoulder and L knee pain. Patient presents with limited R shoulder AROM and strength with positive impingement signs but no signs of RC tear.  L knee flexion is mildly limited with quadriceps strength deficit noted as evidenced by lateral patellar tracking.  OBJECTIVE IMPAIRMENTS: Abnormal gait, decreased activity tolerance, decreased endurance, decreased knowledge of condition, decreased knowledge of use of DME, decreased mobility, difficulty walking, decreased ROM, decreased strength, impaired perceived functional ability, impaired UE functional use, postural dysfunction,  obesity, and pain.   ACTIVITY LIMITATIONS: carrying, lifting, sitting, standing, squatting, stairs, dressing, reach over head, and locomotion level  PERSONAL FACTORS: Age, Behavior pattern, Fitness, Past/current experiences, Time since onset of injury/illness/exacerbation, and 1 comorbidity: Hx of long Covid are also affecting patient's functional outcome.   REHAB POTENTIAL: Good  CLINICAL DECISION MAKING: Stable/uncomplicated  EVALUATION COMPLEXITY: Moderate   GOALS: Goals reviewed with  patient? No  SHORT TERM GOALS: Target date: 11/14/2023    Patient to demonstrate independence in HEP  Baseline: TBD due to time constraints 11/16/23: reports good HEP adherence Goal status: MET  2.  Patient will increase AROM R shoulder to 160d flexion an abduction Baseline:  A/PROM Right eval Left eval  Shoulder flexion 90/160   Shoulder extension    Shoulder abduction 90/160    11/16/23: gross observation ~140 deg AAROM against gravity Goal status: ONGOING  3.  Increase L knee flexion to 130d Baseline: 125d Goal status: ONGOING    LONG TERM GOALS: Target date: 12/05/2023    Patient will acknowledge 6/10 pain at least once during episode of care  in L knee and R shoulder Baseline: 10/10 Goal status: INITIAL  2.  Patient will score at least 24/80 on LEFS to signify clinically meaningful improvement in functional abilities.   Baseline: 16/80 Goal status: INITIAL  3.  Patient will increase 30s chair stand reps from 0 to 5 without arms to demonstrate and improved functional ability with less pain/difficulty as well as reduce fall risk.  Baseline:  Goal status: INITIAL  4.  Increase R shoulder strength to 4/5 Baseline:  MMT Right eval Left eval  Shoulder flexion 4-   Shoulder extension    Shoulder abduction 4-    Goal status: INITIAL  5.  Increase L knee strength to 4+/5 Baseline:  MMT Right eval Left eval  Hip flexion    Hip extension    Hip abduction    Hip adduction    Hip internal rotation    Hip external rotation    Knee flexion  4  Knee extension  4-   Goal status: INITIAL    PLAN:  PT FREQUENCY: 1-2x/week  PT DURATION: 6 weeks  PLANNED INTERVENTIONS: 97110-Therapeutic exercises, 97530- Therapeutic activity, 97112- Neuromuscular re-education, 97535- Self Care, 95284- Manual therapy, [id number]- Gait training, and Patient/Family education  PLAN FOR NEXT SESSION: HEP review and update, manual techniques as appropriate, aerobic tasks, ROM and  flexibility activities, strengthening and PREs, TPDN, gait and balance training as needed     Lovett Ruck PT, DPT 11/20/2023 5:51 PM

## 2023-11-22 ENCOUNTER — Ambulatory Visit: Admitting: Family Medicine

## 2023-11-22 ENCOUNTER — Ambulatory Visit

## 2023-11-22 VITALS — BP 115/71 | Ht 69.0 in | Wt 276.0 lb

## 2023-11-22 DIAGNOSIS — S93401A Sprain of unspecified ligament of right ankle, initial encounter: Secondary | ICD-10-CM | POA: Diagnosis not present

## 2023-11-22 DIAGNOSIS — M25511 Pain in right shoulder: Secondary | ICD-10-CM

## 2023-11-22 DIAGNOSIS — M2392 Unspecified internal derangement of left knee: Secondary | ICD-10-CM | POA: Diagnosis not present

## 2023-11-22 NOTE — Progress Notes (Unsigned)
 DATE OF VISIT: 11/22/2023        Beth [name] DOB: February 03, 1976 MRN: [id number]  CC:  f/u Rt shoulder and left knee; new complaint of Rt ankle pain  History of present Illness: Beth [name] is a 48 y.o. female who presents for a follow-up visit for Rt shoulder, Lt knee, and new c/o Rt ankle pain Last seen by me 10/08/2023 - Diagnosed with right rotator cuff tendinitis and left knee osteoarthritis - Was referred to physical therapy and recommended oral NSAIDs  Since last visit she has been compliant with her physical therapy Notes that she was feeling approximately 75 to 80% improved in the shoulder and knee up until the end of last week when she had a fall She was cleaning some bleachers in a gym, lost her balance, grabbed a loose railing with her right arm, while she was falling her arm was jerked, she landed on her left side and her left knee Had increasing swelling in the left knee Increased pain in the right shoulder Also having some pain and swelling of the right ankle Has continued with physical therapy, last session was 11/20/2023 - There is somewhat limited due to her pain from her injury After the fall, she feels as though she has regressed - Only feeling about 50% improved overall from our initial evaluation for both her knee and her shoulder  Since the fall she has had increased pain in the right shoulder Increased pain with lifting and overhead activities She is having increased nighttime pain Continue use ibuprofen  600-800 mg 3 times daily as needed  Since the fall she has had increased left knee pain and swelling Having some clicking and cracking in the knee Having some feelings of instability Continues to wear her knee brace Started to use her cane again for support, as she is feeling somewhat unsteady - She was able to go without her cane recently  New right ankle pain since the fall Some associated swelling Was able to walk immediately after the injury Has been  using an ankle sleeve Has not had any imaging Ibuprofen  has been somewhat helpful  Medications:  Outpatient Encounter Medications as of 11/22/2023  Medication Sig   albuterol  (VENTOLIN  HFA) 108 (90 Base) MCG/ACT inhaler Inhale 2 puffs into the lungs every 6 (six) hours as needed for wheezing or shortness of breath.   amoxicillin  (AMOXIL ) 875 MG tablet Take 1 tablet (875 mg total) by mouth 2 (two) times daily. (Patient not taking: Reported on 11/16/2023)   Ascorbic Acid  (VITAMIN C) 1000 MG tablet Take 1,000 mg by mouth daily.   benzonatate  (TESSALON ) 200 MG capsule Take 1 capsule (200 mg total) by mouth 3 (three) times daily as needed for cough.   Butenafine  HCl (MENTAX) 1 % cream Apply 1 application topically 2 (two) times daily. (Patient taking differently: Apply 1 application  topically as needed.)   Cholecalciferol (VITAMIN D3 PO) Take 2,000 mg by mouth daily. On Hold   Docusate Sodium  (COLACE PO) Take 1 capsule by mouth daily.   EPINEPHrine  0.3 mg/0.3 mL IJ SOAJ injection Inject 0.3 mg into the muscle as needed for anaphylaxis.   ergocalciferol  (VITAMIN D2) 1.25 MG (50000 UT) capsule Take 1 capsule (50,000 Units total) by mouth once a week. (Patient not taking: Reported on 11/16/2023)   fenofibrate  (TRICOR ) 145 MG tablet Take 1 tablet (145 mg total) by mouth daily.   fluticasone  (FLONASE ) 50 MCG/ACT nasal spray Place 2 sprays into both nostrils daily.   ibuprofen  (ADVIL ) 800  MG tablet Take 1 tablet (800 mg total) by mouth 3 (three) times daily.   ipratropium-albuterol  (DUONEB) 0.5-2.5 (3) MG/3ML SOLN Take 3 mLs by nebulization every 6 (six) hours as needed (shortness of breath, wheezing, and persistent cough).   levonorgestrel  (MIRENA ) 20 MCG/24HR IUD 1 each by Intrauterine route once.    LORazepam  (ATIVAN ) 1 MG tablet Take 1 tablet (1 mg total) by mouth every 8 (eight) hours as needed for anxiety (mri).   Menthol, Topical Analgesic, (BIOFREEZE) 10 % CREA Apply 1 Application topically daily as  needed (pain).   ondansetron  (ZOFRAN -ODT) 8 MG disintegrating tablet Take 1 tablet (8 mg total) by mouth every 8 (eight) hours as needed for nausea or vomiting.   oseltamivir  (TAMIFLU ) 75 MG capsule Take 1 capsule (75 mg total) by mouth 2 (two) times daily. (Patient not taking: Reported on 11/16/2023)   Pancrelipase , Lip-Prot-Amyl, (CREON ) 24000-76000 units CPEP Take 2 capsules by mouth See admin instructions. Take 2 capsules three times daily with food and 1 capsule twice daily with snacks. (Patient not taking: Reported on 11/16/2023)   pantoprazole  (PROTONIX ) 20 MG tablet Take 20 mg by mouth daily.   promethazine -dextromethorphan  (PROMETHAZINE -DM) 6.25-15 MG/5ML syrup Take 5 mLs by mouth 3 (three) times daily as needed for cough.   psyllium (REGULOID) 0.52 g capsule Take 0.52 capsules by mouth as needed.   rosuvastatin  (CRESTOR ) 40 MG tablet Take 1 tablet (40 mg total) by mouth daily.   Zinc  Sulfate (ZINC  15 PO) Take 15 mg by mouth daily.   No facility-administered encounter medications on file as of 11/22/2023.    Allergies: is allergic to bahia, [place] grass, cedar, cladosporium cladosporioides, dust mite extract, elm bark [ulmus fulva], molds & smuts, mugwort, nettle [nettle (urtica dioica)], red mulberry allergy skin test, sorrel-dock mix [sheep sorrel-yellow dock], timothy grass pollen allergen, american cockroach, and mixed ragweed.  Physical Examination: Vitals: BP 115/71   Ht 5\' 9"  (1.753 m)   Wt 276 lb (125.2 kg)   BMI 40.76 kg/m  GENERAL:  Beth [name] is a 48 y.o. female appearing their stated age, alert and oriented x 3, in no apparent distress.  SKIN: no rashes or lesions, skin clean, dry, intact MSK:  Shoulder: Right shoulder without gross deformity.  Tender to palpation over the bicipital groove and greater tuberosity.  Decreased active range of motion in all planes limited by approximately 25% with pain.  Has near full passive range of motion in all planes with associated  pain.  Positive decant, positive Hawkins, positive Neer, negative drop arm.  Rotator cuff 4/5 throughout and limited by pain. Left shoulder with full range of motion without pain, weakness, instability  Knee: Left knee without gross deformity.  Has small effusion.  No increased redness or warmth.  Tender to palpation along the medial and lateral joint line.  Positive McMurray.  Negative Lachman, negative varus and valgus stress test. Right knee has full range of motion without pain, weakness, instability  Ankle: Right ankle with soft tissue swelling.  No increased redness or warmth.  Good range of motion with some pain at terminal dorsiflexion and plantarflexion.  No bony tenderness over the lateral malleolus, fibular head, navicular, base of the fifth metatarsal.  Negative anterior drawer, negative talar tilt. Left ankle full range of motion without pain, weakness, instability Gait: Walking with the assistance of a cane NEURO: sensation intact to light touch upper extremity lower extremity bilaterally VASC: pulses 2+ and symmetric radial artery and posterior tibialis bilaterally  Radiology: Right shoulder  x-ray 10/05/2023 at urgent care showing: - Mild A/C joint degenerative changes - No other bony abnormalities   Left knee x-ray 10/05/2023 4 views AP/lateral/obliques personally r showing: - Mild degenerative changes along the medial and patellofemoral compartments - No other bony abnormalities  Assessment & Plan Acute internal derangement of left knee Acute on chronic left knee pain with underlying osteoarthritis, recent traumatic fall 11/16/2023.  Now with acute worsening of knee pain, exam with ongoing medial lateral joint line tenderness, positive McMurray, positive feelings of instability, now requiring the use of a cane.  Was improving with physical therapy prior to the fall. - Concern for acute meniscal injury or other ligamentous injury due to recent fall  Plan: - MRI left knee rule  out meniscal tear, ligamentous injury.  Patient would be interested in surgical intervention if indicated - She will continue to wear her knee brace - Continue with physical therapy in the interim - Continue ibuprofen  600 to 800 mg 3 times daily as needed with food - Ice as needed - Follow-up after MRI to review results and discuss further treatment Acute pain of right shoulder Acute right shoulder pain with rotator cuff tendinopathy, was improving with physical therapy, traumatic fall 11/16/2023, now with acute worsening of pain.  Exam today with decreased range of motion, positive impingement testing, rotator cuff weakness. - Concern for acute rotator cuff tear due to recent fall  Plan: - MRI right shoulder rule out rotator cuff tear.  Patient would be interested in surgical intervention if indicated - Continue with physical therapy in the interim - Continue ibuprofen  600 to 800 mg 3 times daily as needed with food - Ice as needed - Follow-up after MRI to review results and discuss further treatment Sprain of right ankle, unspecified ligament, initial encounter Acute right ankle sprain status post fall 11/16/2023, negative [place] ankle rules today  Plan: - No imaging indicated at this time - She will continue to wear her ankle sleeve for added support and stability - Continue ibuprofen  600 to 800 mg 3 times daily as needed with food - Ice as needed - Will reassess at her follow-up after she completes her shoulder and knee MRIs.  She can reach out sooner if symptoms are worsening   Patient expressed understanding & agreement with above.  Encounter Diagnoses  Name Primary?   Acute internal derangement of left knee Yes   Acute pain of right shoulder    Sprain of right ankle, unspecified ligament, initial encounter     Orders Placed This Encounter  Procedures   MR SHOULDER RIGHT WO CONTRAST   MR Knee Left  Wo Contrast

## 2023-11-23 ENCOUNTER — Encounter: Payer: Self-pay | Admitting: Family Medicine

## 2023-11-27 ENCOUNTER — Ambulatory Visit: Attending: Family Medicine

## 2023-11-27 DIAGNOSIS — G8929 Other chronic pain: Secondary | ICD-10-CM | POA: Insufficient documentation

## 2023-11-27 DIAGNOSIS — M25511 Pain in right shoulder: Secondary | ICD-10-CM | POA: Diagnosis not present

## 2023-11-27 DIAGNOSIS — M25562 Pain in left knee: Secondary | ICD-10-CM | POA: Insufficient documentation

## 2023-11-27 DIAGNOSIS — R2681 Unsteadiness on feet: Secondary | ICD-10-CM | POA: Diagnosis not present

## 2023-11-27 NOTE — Therapy (Signed)
 OUTPATIENT PHYSICAL THERAPY TREATMENT NOTE   Patient Name: Beth [name] MRN: [id number] DOB:April 18, 1976, 48 y.o., female Today's Date: 11/27/2023  END OF SESSION:  PT End of Session - 11/27/23 1750     Visit Number 6    Number of Visits 12    Date for PT Re-Evaluation 12/24/22    Authorization Type [employer]    PT Start Time 1745    PT Stop Time 1825    PT Time Calculation (min) 40 min    Activity Tolerance Patient tolerated treatment well    Behavior During Therapy WFL for tasks assessed/performed            Past Medical History:  Diagnosis Date   Asthma    Chronic pancreatitis (HCC)    DDD (degenerative disc disease), lumbosacral    DJD (degenerative joint disease)    Headache    Hyperlipidemia    Hypertension    Hypoglycemia    Obesity    Osteoarthritis    Prolactinoma (HCC)    Sciatica    Sleep apnea    Past Surgical History:  Procedure Laterality Date   CHOLECYSTECTOMY     COLONOSCOPY     CRANIOTOMY N/A 01/04/2022   Procedure: Endonasal endoscopic resection of pituitary tumor;  Surgeon: Cannon Champion, MD;  Location: [facility] OR;  Service: Neurosurgery;  Laterality: N/A;  RM 20   NASAL SINUS SURGERY N/A 01/04/2022   Procedure: ENDOSCOPIC SINUS SURGERY TRANSNASAL PITUITARY REMOVAL WITH NASAL NASOSETTAL FLAP;  Surgeon: Daleen Dubs, DO;  Location: [facility] OR;  Service: ENT;  Laterality: N/A;   Patient Active Problem List   Diagnosis Date Noted   Class 3 severe obesity due to excess calories with body mass index (BMI) of 40.0 to 44.9 in adult 03/30/2023   Hypoglycemia 06/16/2022   Status post transsphenoidal pituitary resection (HCC) 01/04/2022   Low TSH level 10/21/2021   Dyslipidemia 07/15/2021   Osteoarthritis 08/20/2020   Sciatica 07/30/2020   DJD (degenerative joint disease)    Obstructive sleep apnea 03/11/2020   Snoring 02/05/2020   Pneumonia due to COVID-19 virus 12/15/2019   Acute respiratory failure with hypoxia (HCC) 12/15/2019   Hypokalemia  12/15/2019   Asthma without acute exacerbation 12/15/2019   Encounter for management of intrauterine contraceptive device (IUD) 06/09/2019   Essential hypertension 06/09/2019   Fatigue 06/09/2019   Slow transit constipation 06/09/2019   Hyperprolactinemia (HCC) 02/15/2018   Migraine 10/12/2017   Chronic pancreatitis (HCC) 11/09/2016   Prolactinoma (HCC) 11/09/2016   BMI 38.0-38.9,adult 11/09/2016   Candida rash of groin 11/09/2016   Mild intermittent asthma 11/09/2016   Mixed hyperlipidemia 11/09/2016    PCP: Marius Siemens, NP   REFERRING PROVIDER: Rodgers Clack, DO  REFERRING DIAG: [phone] (ICD-10-CM) - Rotator cuff tendinitis, right M17.12 (ICD-10-CM) - Primary osteoarthritis of left knee  THERAPY DIAG:  Unsteadiness on feet  Chronic pain of left knee  Chronic right shoulder pain  Rationale for Evaluation and Treatment: Rehabilitation  ONSET DATE: Acute for shoulder, chronic for knee  SUBJECTIVE:  SUBJECTIVE STATEMENT: Continued elevated discomfort in R shoulder, L knee since recent fall, back to baseline discomfort following relief with current OPPT.  MRIs scheduled for this Saturday.   Hand dominance: Right  PERTINENT HISTORY: Also with right shoulder shoulder pain for the last 3 weeks.  No injury or trauma. She does note when she gets out of bed she typically pushes up on the right side No radiation of pain No numbness or tingling Previous shoulder issues for which she did physical therapy following COVID hospitalization in 2021 She has started doing some shoulder circles and wall crawls Evaluated at urgent care 10/05/23 -X-rays were unremarkable of the shoulder - Was given a sling - Rx ibuprofen  800 mg 3 times daily as needed.  This has been somewhat helpful - Recommended to  follow-up with sports medicine  Patient with left knee pain since 04/2023 and right shoulder pain x 3 weeks Left medial knee pain without any specific injury or trauma Pain along the medial knee Some occasional clicking and popping Seen in urgent care 10/05/2023 - Urgent care notes reviewed in detail during the visit today - Fitted with a knee brace - X-ray showed mild osteoarthritis Given Rx ibuprofen  800 mg 3 times daily as needed which has been somewhat helpful  PAIN:  Are you having pain? Yes: NPRS scale: see subjective/10 Pain location: L knee Pain description: ache Aggravating factors: descending stairs, prolonged walking/sitting Relieving factors: heat, topical gels  PAIN:  Are you having pain? Yes: NPRS scale: 10/10 Pain location: R shoulder Pain description: ache, sharp Aggravating factors: OH motions, unexpected motion Relieving factors: massage, heat/ice   PRECAUTIONS: None  RED FLAGS: None   WEIGHT BEARING RESTRICTIONS: No  FALLS:  Has patient fallen in last 6 months? No  OCCUPATION: Office work  PLOF: Independent  PATIENT GOALS:To manage my shoulder and knee pain  NEXT MD VISIT: 6 weeks  OBJECTIVE:  Note: Objective measures were completed at Evaluation unless otherwise noted.  DIAGNOSTIC FINDINGS:  Right shoulder x-ray 10/05/2023 at urgent care personally reviewed and interpreted by me showing: - Mild A/C joint degenerative changes - No other bony abnormalities   Left knee x-ray 10/05/2023 4 views AP/lateral/obliques personally reviewed and interpreted by me today showing: - Mild degenerative changes along the medial and patellofemoral compartments - No other bony abnormalities  PATIENT SURVEYS:  LEFS 16/80  POSTURE: Rounded shoulders  UPPER EXTREMITY ROM:   A/PROM Right eval Left eval  Shoulder flexion 90/160   Shoulder extension    Shoulder abduction 90/160   Shoulder adduction    Shoulder internal rotation    Shoulder external  rotation    Elbow flexion    Elbow extension    Wrist flexion    Wrist extension    Wrist ulnar deviation    Wrist radial deviation    Wrist pronation    Wrist supination    (Blank rows = not tested)  UPPER EXTREMITY MMT:  MMT Right eval Left eval  Shoulder flexion 4-   Shoulder extension    Shoulder abduction 4-   Shoulder adduction    Shoulder internal rotation    Shoulder external rotation    Middle trapezius    Lower trapezius    Elbow flexion    Elbow extension    Wrist flexion    Wrist extension    Wrist ulnar deviation    Wrist radial deviation    Wrist pronation    Wrist supination    Grip strength (lbs)    (  Blank rows = not tested)  SHOULDER SPECIAL TESTS: Impingement tests: Neer impingement test: positive  and Hawkins/Kennedy impingement test: positive  Rotator cuff assessment: Drop arm test: negative, Empty can test: negative, and Hornblower's sign: negative   LOWER EXTREMITY MMT:    MMT Right eval Left eval  Hip flexion    Hip extension    Hip abduction    Hip adduction    Hip internal rotation    Hip external rotation    Knee flexion  4  Knee extension  4-  Ankle dorsiflexion    Ankle plantarflexion    Ankle inversion    Ankle eversion     (Blank rows = not tested)   LOWER EXTREMITY ROM:     Active  Right eval Left eval  Hip flexion    Hip extension    Hip abduction    Hip adduction    Hip internal rotation    Hip external rotation    Knee flexion 125 135  Knee extension    Ankle dorsiflexion    Ankle plantarflexion    Ankle inversion    Ankle eversion     (Blank rows = not tested)   LOWER EXTREMITY SPECIAL TESTS:  Knee special tests: Lateral pull sign: negative and varus/valgus stress negative for laxity  PALPATION:  TTP R biceps tendon, AC joint   Functional testing:  30s chair stand test 0 reps                                                                                                                           TREATMENT DATE:  [facility] Adult PT Treatment:                                                DATE: 11/27/23 Therapeutic Exercise: Nustep L4 8 min Neuromuscular re-ed: Supine press/protraction 500g ball 15x SAQs 2s 15x TKEs RTB 15x Therapeutic Activity: Supine flexion UE Ranger 15x Supine ER UE Ranger 15x Seated ham curls RTB 15x     [facility] Adult PT Treatment:                                                DATE: 11/20/2023  Therapeutic Exercise: Nu step L6, LE/UE Seated pball rolling x 10 flexion, right diagonal  Finger ladder Shoulder flexion walkback at squat rack x5 w 5-10sec hold  Therapeutic Activity: Patient education regarding signs/symptoms that indicate need for urgent care/evaluation.  Additional time with subjective assessment d/t recent fall and related concerns   [facility] Adult PT Treatment:  DATE: 11/16/23 Therapeutic Exercise: Nu step L6, LE/UE Shoulder iso ER/IR x5 BIL at rack Shoulder flexion walkback at squat rack x5 w 5-10sec hold HEP update + education/handout   Therapeutic Activity: STS + OH press (hands clasped) 2x8  Heel raises 2x12 BIL     [facility] Adult PT Treatment:                                                DATE: 11/14/2023  Therapeutic Exercise: Nustep L3 x 5 minute  Shoulder isometrics 3sec x 15 adduction/abduction  Shoulder mobilizations 3 sec x 15   Therapeutic Activity: Supine press 15x Ranger  Supine OH press 15x L QS 3s 15x L SAQ 15x L SLR 15x   PATIENT EDUCATION: Education details: Discussed eval findings, rehab rationale and POC and patient is in agreement  Person educated: Patient Education method: Explanation Education comprehension: verbalized understanding and needs further education  HOME EXERCISE PROGRAM: Access Code: [email or web address] URL: [email or web address] Date: 11/16/2023 Prepared by: Mayme Spearman  Exercises - Standing Isometric Shoulder External  Rotation with Doorway  - 2-3 x daily - 7 x weekly - 1 sets - 5 reps - Standing Isometric Shoulder Internal Rotation at Doorway  - 2-3 x daily - 7 x weekly - 1 sets - 5 reps - Standing 'L' Stretch at Counter  - 2-3 x daily - 7 x weekly - 1 sets - 5 reps  ASSESSMENT:   CLINICAL IMPRESSION: Continued knee/shoulder symptoms exacerbated by recent fall.  Able to participate in exercise today but tasks limited to accommodate symptoms.  Focus placed on ROM of R shoulder and TKE of L knee.  Able to perform aerobic work for additional time.  Required frequent rest breaks to accommodate symptoms   11/27/2023 Beth [name] had poor tolerance of today's treatment session d/t recent symptom exacerbation, after falling last week and sustaining several subsequent injuries Patient is expected to f/u with referring provider on 11/22/23 prior to next scheduled visit. Plan is to discuss POC focus based on Dr.Jacobs assessment/recommendation.      Per eval - Patient is a 48 y.o. female who was seen today for physical therapy evaluation and treatment for chronic R shoulder and L knee pain. Patient presents with limited R shoulder AROM and strength with positive impingement signs but no signs of RC tear.  L knee flexion is mildly limited with quadriceps strength deficit noted as evidenced by lateral patellar tracking.  OBJECTIVE IMPAIRMENTS: Abnormal gait, decreased activity tolerance, decreased endurance, decreased knowledge of condition, decreased knowledge of use of DME, decreased mobility, difficulty walking, decreased ROM, decreased strength, impaired perceived functional ability, impaired UE functional use, postural dysfunction, obesity, and pain.   ACTIVITY LIMITATIONS: carrying, lifting, sitting, standing, squatting, stairs, dressing, reach over head, and locomotion level  PERSONAL FACTORS: Age, Behavior pattern, Fitness, Past/current experiences, Time since onset of injury/illness/exacerbation, and 1 comorbidity: Hx of long  Covid are also affecting patient's functional outcome.   REHAB POTENTIAL: Good  CLINICAL DECISION MAKING: Stable/uncomplicated  EVALUATION COMPLEXITY: Moderate   GOALS: Goals reviewed with patient? No  SHORT TERM GOALS: Target date: 11/14/2023    Patient to demonstrate independence in HEP  Baseline: TBD due to time constraints 11/16/23: reports good HEP adherence Goal status: MET  2.  Patient will increase AROM R shoulder to 160d flexion an abduction Baseline:  A/PROM Right eval Left eval  Shoulder flexion 90/160   Shoulder extension    Shoulder abduction 90/160    11/16/23: gross observation ~140 deg AAROM against gravity Goal status: ONGOING  3.  Increase L knee flexion to 130d Baseline: 125d Goal status: ONGOING    LONG TERM GOALS: Target date: 12/05/2023    Patient will acknowledge 6/10 pain at least once during episode of care  in L knee and R shoulder Baseline: 10/10 Goal status: INITIAL  2.  Patient will score at least 24/80 on LEFS to signify clinically meaningful improvement in functional abilities.   Baseline: 16/80 Goal status: INITIAL  3.  Patient will increase 30s chair stand reps from 0 to 5 without arms to demonstrate and improved functional ability with less pain/difficulty as well as reduce fall risk.  Baseline:  Goal status: INITIAL  4.  Increase R shoulder strength to 4/5 Baseline:  MMT Right eval Left eval  Shoulder flexion 4-   Shoulder extension    Shoulder abduction 4-    Goal status: INITIAL  5.  Increase L knee strength to 4+/5 Baseline:  MMT Right eval Left eval  Hip flexion    Hip extension    Hip abduction    Hip adduction    Hip internal rotation    Hip external rotation    Knee flexion  4  Knee extension  4-   Goal status: INITIAL    PLAN:  PT FREQUENCY: 1-2x/week  PT DURATION: 6 weeks  PLANNED INTERVENTIONS: 97110-Therapeutic exercises, 97530- Therapeutic activity, 97112- Neuromuscular re-education,  97535- Self Care, 16109- Manual therapy, [phone]- Gait training, and Patient/Family education  PLAN FOR NEXT SESSION: HEP review and update, manual techniques as appropriate, aerobic tasks, ROM and flexibility activities, strengthening and PREs, TPDN, gait and balance training as needed     Arlester Bence, PT, DPT  11/27/2023 6:35 PM

## 2023-11-30 DIAGNOSIS — Z01411 Encounter for gynecological examination (general) (routine) with abnormal findings: Secondary | ICD-10-CM | POA: Diagnosis not present

## 2023-11-30 DIAGNOSIS — D352 Benign neoplasm of pituitary gland: Secondary | ICD-10-CM | POA: Diagnosis not present

## 2023-11-30 DIAGNOSIS — M5431 Sciatica, right side: Secondary | ICD-10-CM | POA: Diagnosis not present

## 2023-11-30 DIAGNOSIS — M5432 Sciatica, left side: Secondary | ICD-10-CM | POA: Diagnosis not present

## 2023-12-01 ENCOUNTER — Ambulatory Visit
Admission: RE | Admit: 2023-12-01 | Discharge: 2023-12-01 | Disposition: A | Discharge status: Home / Self Care | Source: Ambulatory Visit | Attending: Family Medicine | Admitting: Family Medicine

## 2023-12-01 DIAGNOSIS — M7581 Other shoulder lesions, right shoulder: Secondary | ICD-10-CM | POA: Diagnosis not present

## 2023-12-01 DIAGNOSIS — M25462 Effusion, left knee: Secondary | ICD-10-CM | POA: Diagnosis not present

## 2023-12-01 DIAGNOSIS — M25511 Pain in right shoulder: Secondary | ICD-10-CM

## 2023-12-01 DIAGNOSIS — M25562 Pain in left knee: Secondary | ICD-10-CM | POA: Diagnosis not present

## 2023-12-01 DIAGNOSIS — M2392 Unspecified internal derangement of left knee: Secondary | ICD-10-CM

## 2023-12-01 DIAGNOSIS — M19011 Primary osteoarthritis, right shoulder: Secondary | ICD-10-CM | POA: Diagnosis not present

## 2023-12-03 ENCOUNTER — Ambulatory Visit: Payer: Self-pay | Admitting: Family Medicine

## 2023-12-03 DIAGNOSIS — Z124 Encounter for screening for malignant neoplasm of cervix: Secondary | ICD-10-CM | POA: Diagnosis not present

## 2023-12-03 NOTE — Progress Notes (Signed)
 MRI results reviewed.  MyChart message sent

## 2023-12-06 IMAGING — MR MR HEAD WO/W CM
11 of 20 series · 25 of 48 positions shown · IV contrast (multihance)
Comparison: None Available.

CLINICAL DATA: Vision loss, binocular

EXAM:
MRI HEAD WITHOUT AND WITH CONTRAST
TECHNIQUE: Multiplanar, multiecho pulse sequences of the brain and surrounding
structures were obtained without and with intravenous contrast.
CONTRAST:  10mL MULTIHANCE GADOBENATE DIMEGLUMINE 529 MG/ML IV SOLN

[Series 2: t1_se_sag · sagittal · 5.0mm · 0.45mm/px · 1 of 23 slices shown]
[im 1/23]
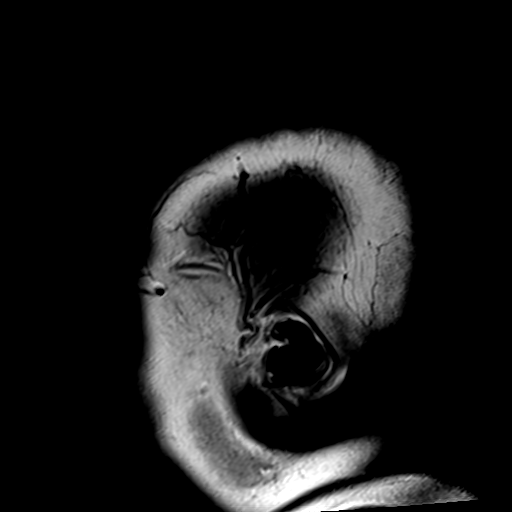

[Series 5: T2 · axial · 5.0mm · 0.45mm/px · z∈[-37,+99]mm · 2 of 22 slices shown (1 of 2)]
[im 1/22]
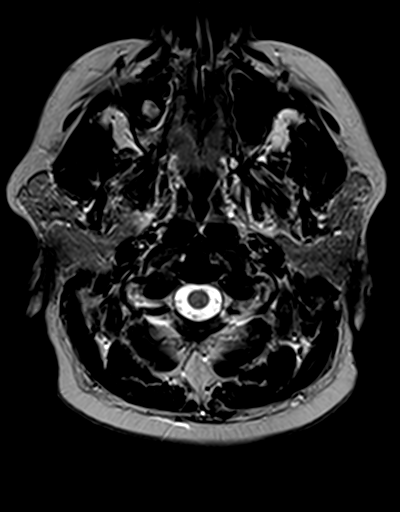
[im 22/22]
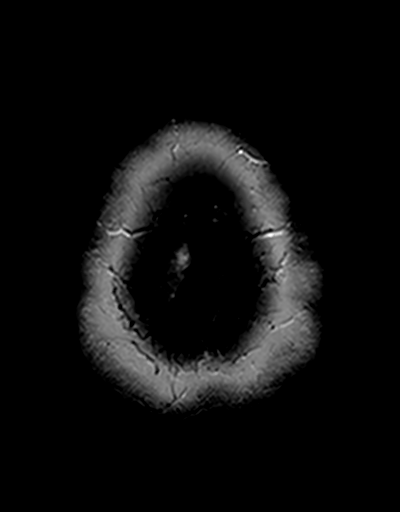

[Series 6: GRE · axial · 5.0mm · 0.45mm/px · z∈[-38,+99]mm · 2 of 22 slices shown]
[im 1/22]
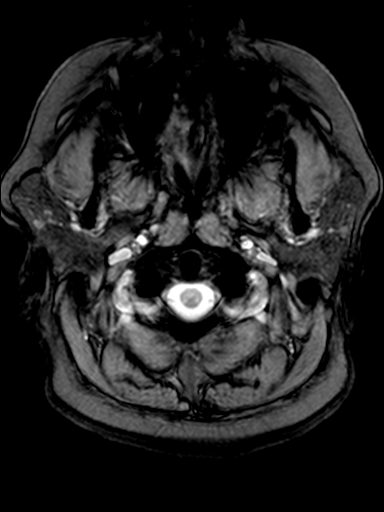
[im 22/22]
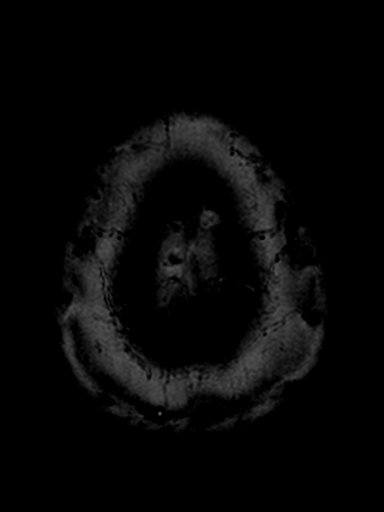

[Series 7: FLAIR · axial · 5.0mm · 0.45mm/px · z∈[-34,+102]mm · 2 of 22 slices shown]
[im 1/22]
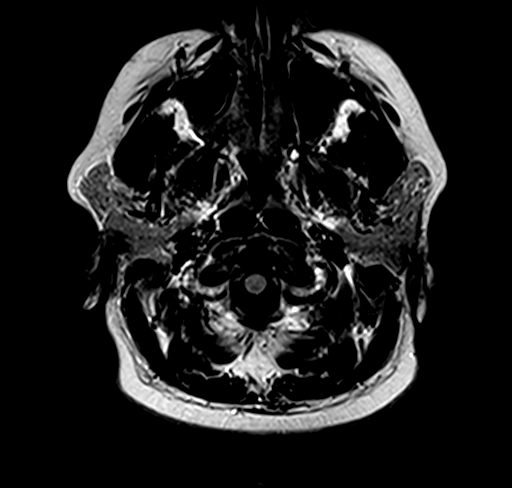
[im 22/22]
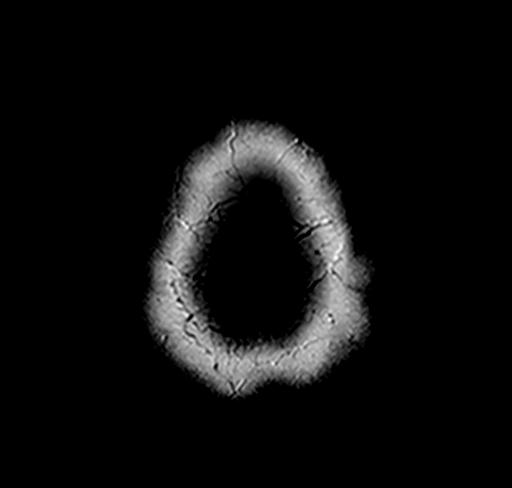

[Series 8: T1 · sagittal · 3.0mm · 0.35mm/px · 1 of 15 slices shown (1 of 2)]
[im 1/15]
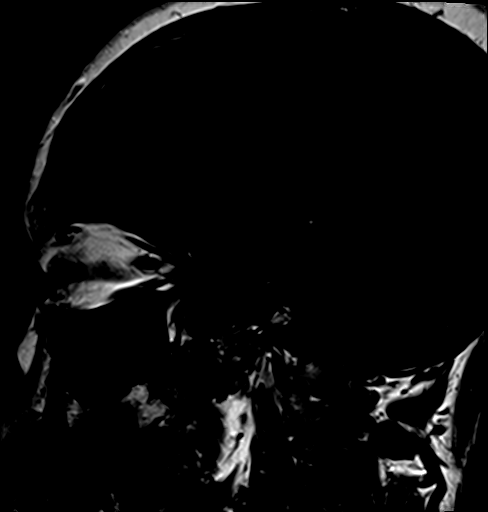

[Series 9: T1 · coronal · 3.0mm · 0.35mm/px · 2 of 18 slices shown (2 of 2)]
[im 1/18]
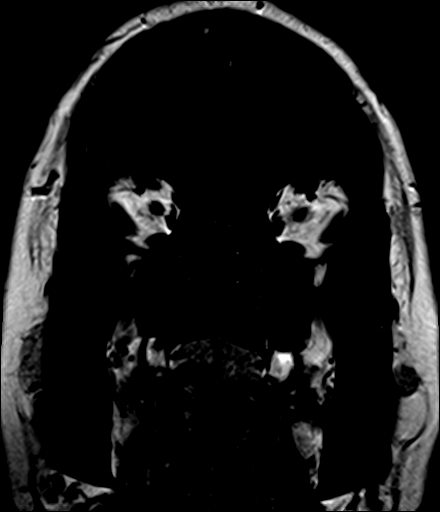
[im 18/18]
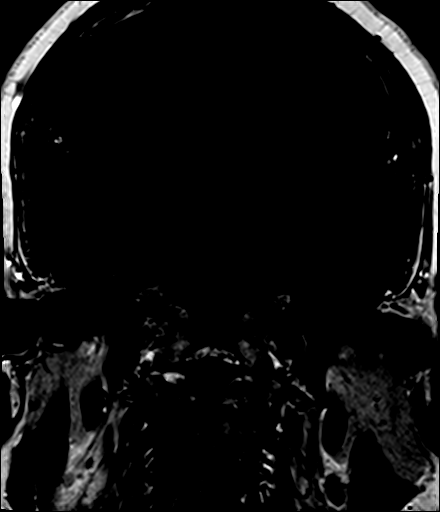

[Series 10: T2 · coronal · 3.0mm · 0.40mm/px · 2 of 18 slices shown (2 of 2)]
[im 1/18]
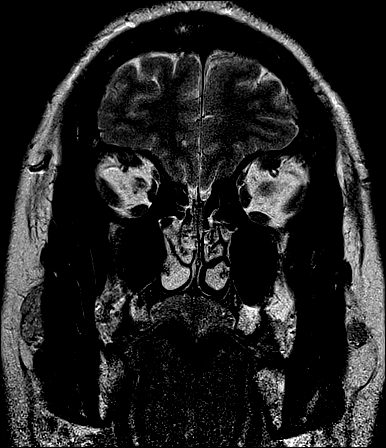
[im 18/18]
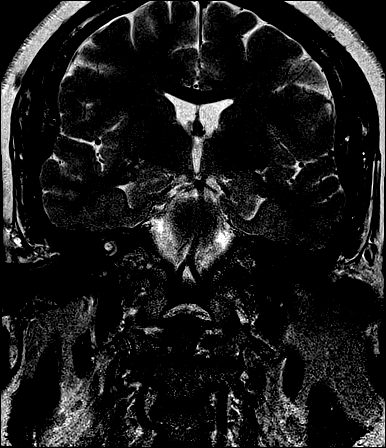

[Series 18: T1 post-contrast · coronal · 3.0mm · 0.35mm/px · 2 of 18 slices shown (1 of 4)]
[im 1/18]
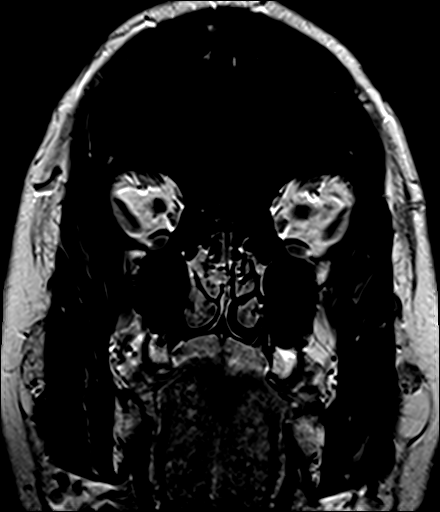
[im 18/18]
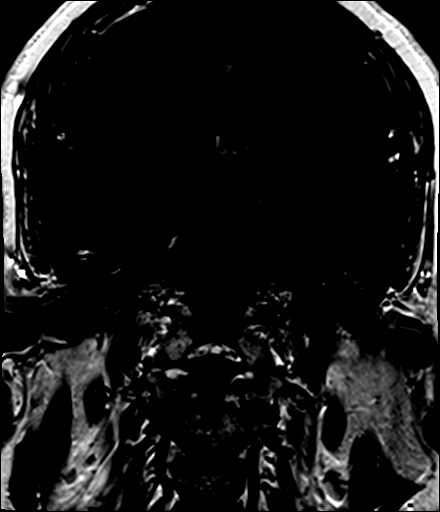

[Series 19: T1 post-contrast · sagittal · 3.0mm · 0.35mm/px · 1 of 15 slices shown (2 of 4)]
[im 1/15]
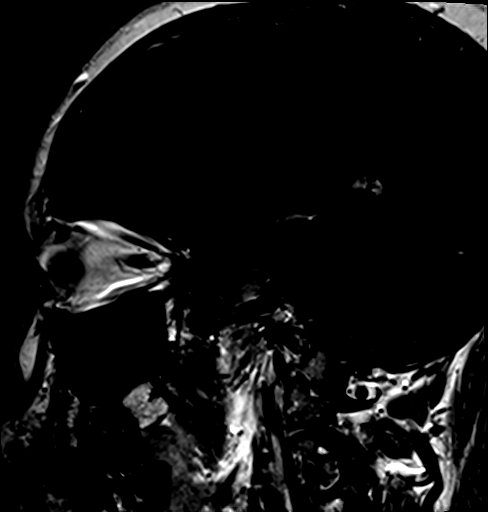

[Series 20: T1 post-contrast · axial · 2.0mm · 0.45mm/px · z∈[-38,+104]mm · 7 of 72 slices shown (3 of 4)]
[im 1/72]
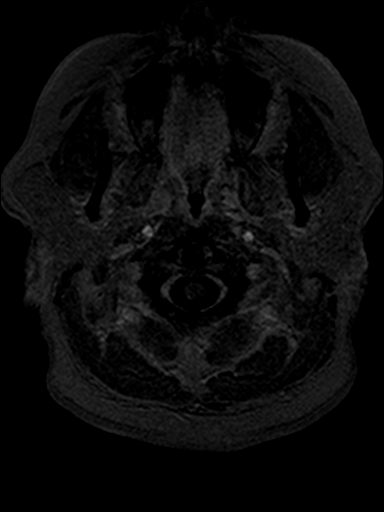
[im 12/72]
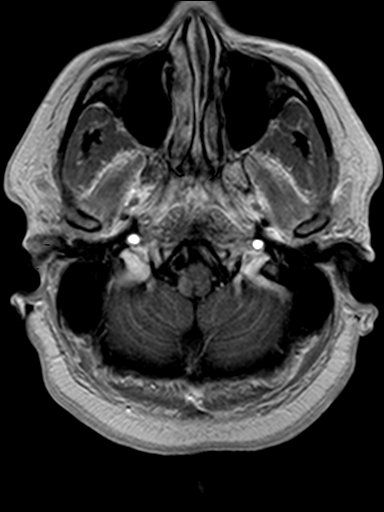
[im 24/72]
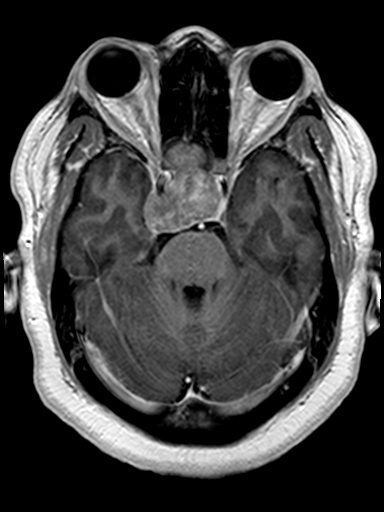
[im 36/72]
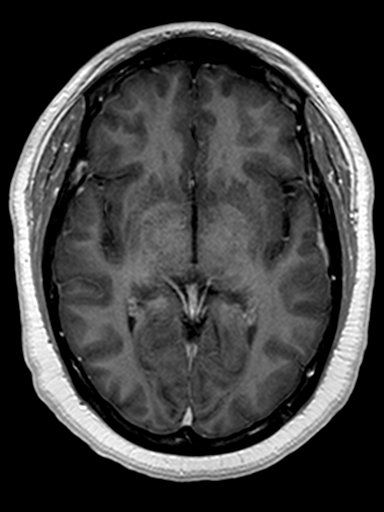
[im 48/72]
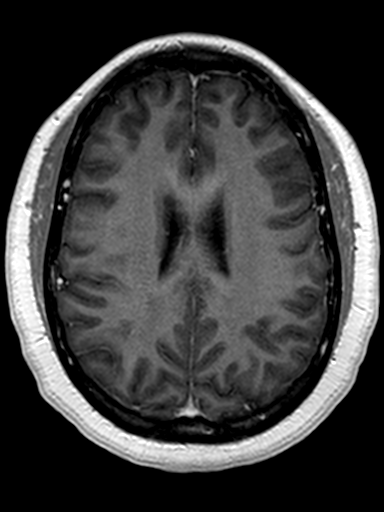
[im 60/72]
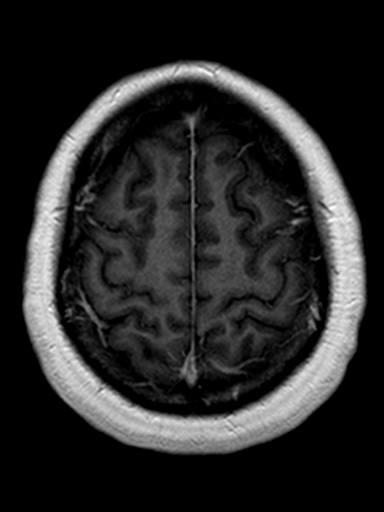
[im 72/72]
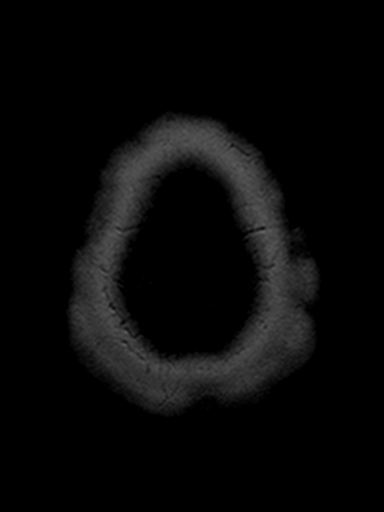

[Series 21: T1 post-contrast · coronal · 5.0mm · 0.69mm/px · 3 of 27 slices shown (4 of 4)]
[im 1/27]
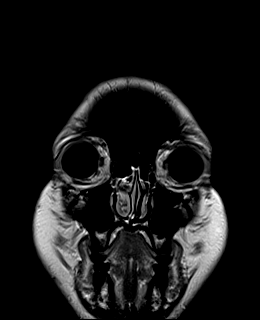
[im 14/27]
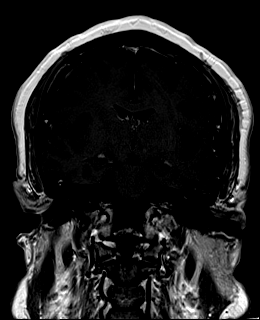
[im 27/27]
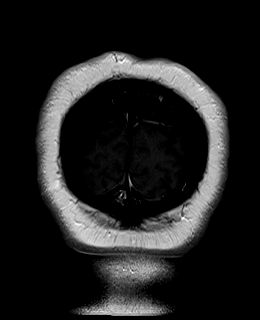

[25 of 48 positions shown; findings below may reference images not displayed]

FINDINGS: Brain: Very large heterogeneously enhancing sellar/suprasellar mass,
which involves the pituitary gland and measures up to 4.1 x 3.4 x
4.1 cm (AP by transverse by craniocaudal). Significant suprasellar
extension with significant mass effect on the optic chiasm. Tumor
extends into the sphenoid sinuses bilaterally. Definite right and
probable left cavernous sinus invasion with apparent encasement of
the right greater than left internal carotid arteries.

No evidence of acute infarct, acute hemorrhage, hydrocephalus,
midline shift, or extra-axial fluid collection. No pathologic
enhancement outside of the pituitary/sella.

Vascular: Major arterial flow voids are maintained at the skull
base.

Skull and upper cervical spine: Normal marrow signal.

Sinuses/Orbits: Tumor within the sphenoid sinuses, described above.
Otherwise, sinuses are largely clear.

Other: No mastoid effusions.ac
IMPRESSION: Very large 4.1 x 3.4 by 4.1 cm sellar/suprasellar mass with
significant mass effect on the optic chiasm, cavernous sinus
invasion, and extension into the sphenoid sinus, probably a
pituitary macroadenoma. Recommend neurosurgical consultation. A

These results will be called to the ordering clinician or
representative by the Radiologist Assistant, and communication
documented in the PACS or [REDACTED].

## 2023-12-07 NOTE — Therapy (Signed)
 OUTPATIENT PHYSICAL THERAPY RE-ASSESSMENT/RECERTIFICATION   Patient Name: Shylyn Younce MRN: [id number] DOB:02/27/1976, 48 y.o., female Today's Date: 12/08/2023  END OF SESSION:  PT End of Session - 12/08/23 0906     Visit Number 7    Number of Visits 19    Date for PT Re-Evaluation 01/19/24    Authorization Type [employer]    PT Start Time 0906   late check in   PT Stop Time 0944    PT Time Calculation (min) 38 min    Activity Tolerance Patient tolerated treatment well          Past Medical History:  Diagnosis Date   Asthma    Chronic pancreatitis (HCC)    DDD (degenerative disc disease), lumbosacral    DJD (degenerative joint disease)    Headache    Hyperlipidemia    Hypertension    Hypoglycemia    Obesity    Osteoarthritis    Prolactinoma (HCC)    Sciatica    Sleep apnea    Past Surgical History:  Procedure Laterality Date   CHOLECYSTECTOMY     COLONOSCOPY     CRANIOTOMY N/A 01/04/2022   Procedure: Endonasal endoscopic resection of pituitary tumor;  Surgeon: Cannon Champion, MD;  Location: [facility];  Service: Neurosurgery;  Laterality: N/A;  RM 20   NASAL SINUS SURGERY N/A 01/04/2022   Procedure: ENDOSCOPIC SINUS SURGERY TRANSNASAL PITUITARY REMOVAL WITH NASAL NASOSETTAL FLAP;  Surgeon: Daleen Dubs, DO;  Location: [facility] OR;  Service: ENT;  Laterality: N/A;   Patient Active Problem List   Diagnosis Date Noted   Class 3 severe obesity due to excess calories with body mass index (BMI) of 40.0 to 44.9 in adult 03/30/2023   Hypoglycemia 06/16/2022   Status post transsphenoidal pituitary resection (HCC) 01/04/2022   Low TSH level 10/21/2021   Dyslipidemia 07/15/2021   Osteoarthritis 08/20/2020   Sciatica 07/30/2020   DJD (degenerative joint disease)    Obstructive sleep apnea 03/11/2020   Snoring 02/05/2020   Pneumonia due to COVID-19 virus 12/15/2019   Acute respiratory failure with hypoxia (HCC) 12/15/2019   Hypokalemia 12/15/2019   Asthma without acute  exacerbation 12/15/2019   Encounter for management of intrauterine contraceptive device (IUD) 06/09/2019   Essential hypertension 06/09/2019   Fatigue 06/09/2019   Slow transit constipation 06/09/2019   Hyperprolactinemia (HCC) 02/15/2018   Migraine 10/12/2017   Chronic pancreatitis (HCC) 11/09/2016   Prolactinoma (HCC) 11/09/2016   BMI 38.0-38.9,adult 11/09/2016   Candida rash of groin 11/09/2016   Mild intermittent asthma 11/09/2016   Mixed hyperlipidemia 11/09/2016    PCP: Marius Siemens, NP   REFERRING PROVIDER: Rodgers Clack, DO  REFERRING DIAG: [id number] (ICD-10-CM) - Rotator cuff tendinitis, right M17.12 (ICD-10-CM) - Primary osteoarthritis of left knee  THERAPY DIAG:  Unsteadiness on feet  Chronic pain of left knee  Chronic right shoulder pain  Rationale for Evaluation and Treatment: Rehabilitation  ONSET DATE: Acute for shoulder, chronic for knee  SUBJECTIVE:  SUBJECTIVE STATEMENT:  12/08/2023: pt states she has been trying to rest to accommodate swelling but notes imaging was reassuring. States she was told she may be able to pursue injections if needed, was encouraged to continue PT. States prior to fall she feels like she was improving well. Has improved some since fall but not quite back to where she was.     Hand dominance: Right  PERTINENT HISTORY: Also with right shoulder shoulder pain for the last 3 weeks.  No injury or trauma. She does note when she gets out of bed she typically pushes up on the right side No radiation of pain No numbness or tingling Previous shoulder issues for which she did physical therapy following COVID hospitalization in 2021 She has started doing some shoulder circles and wall crawls Evaluated at urgent care 10/05/23 -X-rays were unremarkable of the  shoulder - Was given a sling - Rx ibuprofen  800 mg 3 times daily as needed.  This has been somewhat helpful - Recommended to follow-up with sports medicine  Patient with left knee pain since 04/2023 and right shoulder pain x 3 weeks Left medial knee pain without any specific injury or trauma Pain along the medial knee Some occasional clicking and popping Seen in urgent care 10/05/2023 - Urgent care notes reviewed in detail during the visit today - Fitted with a knee brace - X-ray showed mild osteoarthritis Given Rx ibuprofen  800 mg 3 times daily as needed which has been somewhat helpful  PAIN:  Are you having pain? Yes: NPRS scale: see subjective/10 Pain location: L knee Pain description: ache Aggravating factors: descending stairs, prolonged walking/sitting Relieving factors: heat, topical gels  PAIN:  Are you having pain? 12/08/23 - R shoulder: 2/10 currently, 1-10/10 in past week - L knee: 1/10 currently, 1-8/10  Prior to 12/08/23 visit: NPRS scale: 10/10 Pain location: R shoulder Pain description: ache, sharp Aggravating factors: OH motions, unexpected motion Relieving factors: massage, heat/ice   PRECAUTIONS: None  RED FLAGS: None   WEIGHT BEARING RESTRICTIONS: No  FALLS:  Has patient fallen in last 6 months? No  OCCUPATION: Office work  PLOF: Independent  PATIENT GOALS:To manage my shoulder and knee pain  NEXT MD VISIT: 6 weeks  OBJECTIVE:  Note: Objective measures were completed at Evaluation unless otherwise noted.  DIAGNOSTIC FINDINGS:  Right shoulder x-ray 10/05/2023 at urgent care personally reviewed and interpreted by me showing: - Mild A/C joint degenerative changes - No other bony abnormalities   Left knee x-ray 10/05/2023 4 views AP/lateral/obliques personally reviewed and interpreted by me today showing: - Mild degenerative changes along the medial and patellofemoral compartments - No other bony abnormalities  PATIENT SURVEYS:  LEFS  16/80  12/08/23 LEFS 31/80  POSTURE: Rounded shoulders  UPPER EXTREMITY ROM:   A/PROM Right eval Left eval R 12/08/23  Shoulder flexion 90/160  A: 153 deg  Shoulder extension     Shoulder abduction 90/160    Shoulder adduction     Shoulder internal rotation     Shoulder external rotation     Elbow flexion     Elbow extension     Wrist flexion     Wrist extension     Wrist ulnar deviation     Wrist radial deviation     Wrist pronation     Wrist supination     (Blank rows = not tested)  UPPER EXTREMITY MMT:  MMT Right eval Left eval R/L 12/08/23    Shoulder flexion 4-  4+*/5  Shoulder  extension     Shoulder abduction 4-  4+*/5  Shoulder adduction     Shoulder internal rotation     Shoulder external rotation     Middle trapezius     Lower trapezius     Elbow flexion     Elbow extension     Wrist flexion     Wrist extension     Wrist ulnar deviation     Wrist radial deviation     Wrist pronation     Wrist supination     Grip strength (lbs)     (Blank rows = not tested)  SHOULDER SPECIAL TESTS: Impingement tests: Neer impingement test: positive  and Hawkins/Kennedy impingement test: positive  Rotator cuff assessment: Drop arm test: negative, Empty can test: negative, and Hornblower's sign: negative   LOWER EXTREMITY MMT:    MMT Right eval Left eval R/L 12/08/23    Hip flexion     Hip extension     Hip abduction     Hip adduction     Hip internal rotation     Hip external rotation     Knee flexion  4 5/5  Knee extension  4- 5/4*  Ankle dorsiflexion     Ankle plantarflexion     Ankle inversion     Ankle eversion      (Blank rows = not tested)   LOWER EXTREMITY ROM:     Active  Right eval Left eval L 12/08/23   Hip flexion     Hip extension     Hip abduction     Hip adduction     Hip internal rotation     Hip external rotation     Knee flexion 125 135 A: 130 deg *  Knee extension     Ankle dorsiflexion     Ankle plantarflexion     Ankle  inversion     Ankle eversion      (Blank rows = not tested)   LOWER EXTREMITY SPECIAL TESTS:  Knee special tests: Lateral pull sign: negative and varus/valgus stress negative for laxity  PALPATION:  TTP R biceps tendon, AC joint   Functional testing:  30s chair stand test 0 reps   12/08/23:  - 30secSTS 4 reps (beginning ascent for fifth rep at 30sec mark), moderate exertion and knee pain                                                                                                                          TREATMENT DATE:  [facility] Adult PT Treatment:                                                DATE: 12/08/23 Therapeutic Exercise: Seated heel/toe raises x12 LAQ x12 Seated scap retraction x12 cues for posture HEP update + education/handout + rationale for interventions  Therapeutic Activity: MSK assessment + education 30sec STS + education Education/discussion re: progress with PT, symptom behavior as it affects activity tolerance, PT goals/POC  Education/discussion re: activity modification, pacing of tasks, frequent/short bouts of mobility and HEP to mitigate stiffness   [facility] Adult PT Treatment:                                                DATE: 11/27/23 Therapeutic Exercise: Nustep L4 8 min Neuromuscular re-ed: Supine press/protraction 500g ball 15x SAQs 2s 15x TKEs RTB 15x Therapeutic Activity: Supine flexion UE Ranger 15x Supine ER UE Ranger 15x Seated ham curls RTB 15x     [facility] Adult PT Treatment:                                                DATE: 11/20/2023  Therapeutic Exercise: Nu step L6, LE/UE Seated pball rolling x 10 flexion, right diagonal  Finger ladder Shoulder flexion walkback at squat rack x5 w 5-10sec hold  Therapeutic Activity: Patient education regarding signs/symptoms that indicate need for urgent care/evaluation.  Additional time with subjective assessment d/t recent fall and related concerns   [facility] Adult PT Treatment:                                                 DATE: 11/16/23 Therapeutic Exercise: Nu step L6, LE/UE Shoulder iso ER/IR x5 BIL at rack Shoulder flexion walkback at squat rack x5 w 5-10sec hold HEP update + education/handout   Therapeutic Activity: STS + OH press (hands clasped) 2x8  Heel raises 2x12 BIL     [facility] Adult PT Treatment:                                                DATE: 11/14/2023  Therapeutic Exercise: Nustep L3 x 5 minute  Shoulder isometrics 3sec x 15 adduction/abduction  Shoulder mobilizations 3 sec x 15   Therapeutic Activity: Supine press 15x Ranger  Supine OH press 15x L QS 3s 15x L SAQ 15x L SLR 15x   PATIENT EDUCATION: Education details: rationale for interventions, HEP, PT goals/POC Person educated: Patient Education method: Explanation, Demonstration, Tactile cues, Verbal cues Education comprehension: verbalized understanding, returned demonstration, verbal cues required, tactile cues required, and needs further education     HOME EXERCISE PROGRAM: Access Code: [email or web address] URL: [email or web address] Date: 12/08/2023 Prepared by: Mayme Spearman  Exercises - Standing Isometric Shoulder External Rotation with Doorway  - 2-3 x daily - 7 x weekly - 1 sets - 5 reps - Standing Isometric Shoulder Internal Rotation at Doorway  - 2-3 x daily - 7 x weekly - 1 sets - 5 reps - Standing 'L' Stretch at Counter  - 2-3 x daily - 7 x weekly - 1 sets - 5 reps - Seated Heel Toe Raises  - 2-3 x daily - 7 x weekly - 1 sets - 10 reps - Seated Long Arc Quad  - 2-3 x  daily - 7 x weekly - 1 sets - 10 reps  ASSESSMENT:   CLINICAL IMPRESSION:  12/08/2023: Pt arrives w/ report of 1-2/10 pain for shoulder/knee, improvement since fall but symptoms remain exacerbated; received MRI results which were reassuring, refer to [facility] for details. Looking at goals, pt demonstrates notable improvements in MMT/ROM and 30sec STS despite symptom exacerbation post fall, although deficits do  persist and she has not yet fully met LTG. Continues to require rest breaks to maximize tolerance. HEP update today with emphasis on strategies to mitigate stiffness with prolonged sitting. Recommend extension of POC to address relevant deficits, minimize pain, and maximize functional tolerance. No adverse events, pt reports some UT soreness on departure but otherwise no change in symptoms. Pt departs today's session in no acute distress, all voiced questions/concerns addressed appropriately from PT perspective.        Per eval - Patient is a 48 y.o. female who was seen today for physical therapy evaluation and treatment for chronic R shoulder and L knee pain. Patient presents with limited R shoulder AROM and strength with positive impingement signs but no signs of RC tear.  L knee flexion is mildly limited with quadriceps strength deficit noted as evidenced by lateral patellar tracking.  OBJECTIVE IMPAIRMENTS: Abnormal gait, decreased activity tolerance, decreased endurance, decreased knowledge of condition, decreased knowledge of use of DME, decreased mobility, difficulty walking, decreased ROM, decreased strength, impaired perceived functional ability, impaired UE functional use, postural dysfunction, obesity, and pain.   ACTIVITY LIMITATIONS: carrying, lifting, sitting, standing, squatting, stairs, dressing, reach over head, and locomotion level  PERSONAL FACTORS: Age, Behavior pattern, Fitness, Past/current experiences, Time since onset of injury/illness/exacerbation, and 1 comorbidity: Hx of long Covid are also affecting patient's functional outcome.   REHAB POTENTIAL: Good  CLINICAL DECISION MAKING: Stable/uncomplicated  EVALUATION COMPLEXITY: Moderate   GOALS: Goals reviewed with patient? No  SHORT TERM GOALS: Target date: 11/14/2023    Patient to demonstrate independence in HEP  Baseline: TBD due to time constraints 11/16/23: reports good HEP adherence Goal status: MET  2.   Patient will increase AROM R shoulder to 160d flexion an abduction Baseline:  A/PROM Right eval Left eval  Shoulder flexion 90/160   Shoulder extension    Shoulder abduction 90/160    11/16/23: gross observation ~140 deg AAROM against gravity 12/08/23: 153 deg Goal status: PROGRESSING  3.  Increase L knee flexion to 130d Baseline: 125d 12/08/23: 130 deg painful Goal status: MET    LONG TERM GOALS: Target date: 01/19/2024  (Updated 12/08/23)    Patient will acknowledge 6/10 pain at least once during episode of care  in L knee and R shoulder Baseline: 10/10 12/08/23:  R shoulder: 2/10 currently, 1-10/10 in past week; L knee: 1/10 currently, 1-8/10 Goal status: MET  2.  Patient will score at least 45/80 on LEFS to signify clinically meaningful improvement in functional abilities.   Baseline: 16/80 12/08/23: 31/80 (meeting initial goal) Goal status: NEW/UPDATED 12/08/23  3.  Patient will increase 30s chair stand reps from 0 to 5 without arms to demonstrate and improved functional ability with less pain/difficulty as well as reduce fall risk.  Baseline:  12/08/23: 4 reps w/ exertion/pain Goal status: PROGRESSING  4.  Increase R shoulder strength to 4/5 Baseline:  MMT Right eval Left eval  Shoulder flexion 4-   Shoulder extension    Shoulder abduction 4-    12/08/23: see MMT chart above Goal status: MET  5.  Increase L knee strength to  4+/5 Baseline:  MMT Right eval Left eval  Hip flexion    Hip extension    Hip abduction    Hip adduction    Hip internal rotation    Hip external rotation    Knee flexion  4  Knee extension  4-   12/08/23: see MMT chart above Goal status: PROGRESSING    PLAN: (updated 12/08/23)  PT FREQUENCY: 1-2x/week  PT DURATION: 6 weeks  PLANNED INTERVENTIONS: 97110-Therapeutic exercises, 97530- Therapeutic activity, 97112- Neuromuscular re-education, 97535- Self Care, 16606- Manual therapy, [phone]- Gait training, and Patient/Family  education  PLAN FOR NEXT SESSION: HEP review and update, manual techniques as appropriate, aerobic tasks, ROM and flexibility activities, strengthening and PREs, TPDN, gait and balance training as needed     Lovett Ruck PT, DPT 12/08/2023 10:37 AM

## 2023-12-08 ENCOUNTER — Ambulatory Visit: Payer: Self-pay | Admitting: Physical Therapy

## 2023-12-08 ENCOUNTER — Encounter: Payer: Self-pay | Admitting: Physical Therapy

## 2023-12-08 DIAGNOSIS — R2681 Unsteadiness on feet: Secondary | ICD-10-CM | POA: Diagnosis not present

## 2023-12-08 DIAGNOSIS — G8929 Other chronic pain: Secondary | ICD-10-CM

## 2023-12-08 DIAGNOSIS — M25562 Pain in left knee: Secondary | ICD-10-CM | POA: Diagnosis not present

## 2023-12-08 DIAGNOSIS — M25511 Pain in right shoulder: Secondary | ICD-10-CM | POA: Diagnosis not present

## 2023-12-12 ENCOUNTER — Other Ambulatory Visit

## 2023-12-13 ENCOUNTER — Other Ambulatory Visit: Payer: Self-pay | Admitting: Licensed Clinical Social Worker

## 2023-12-13 NOTE — Patient Outreach (Signed)
 LCSW introduced self and explained role in complex care management. Patient reports that she is interested in services; however, is requesting to r/s. Appt was r/s for 12/14/23  Alease Hunter, LCSW [place]  [facility], [facility] Clinical Social Worker Direct Dial: [phone]  Fax: [phone] Website: [email or web address] 9:17 AM

## 2023-12-14 ENCOUNTER — Other Ambulatory Visit: Payer: Self-pay | Admitting: Licensed Clinical Social Worker

## 2023-12-15 ENCOUNTER — Ambulatory Visit: Payer: Self-pay

## 2023-12-15 DIAGNOSIS — M25511 Pain in right shoulder: Secondary | ICD-10-CM | POA: Diagnosis not present

## 2023-12-15 DIAGNOSIS — G8929 Other chronic pain: Secondary | ICD-10-CM | POA: Diagnosis not present

## 2023-12-15 DIAGNOSIS — R2681 Unsteadiness on feet: Secondary | ICD-10-CM | POA: Diagnosis not present

## 2023-12-15 DIAGNOSIS — M25562 Pain in left knee: Secondary | ICD-10-CM | POA: Diagnosis not present

## 2023-12-15 NOTE — Therapy (Signed)
 OUTPATIENT PHYSICAL THERAPY NOTE   Patient Name: Beth [name] MRN: [id number] DOB:1975/08/13, 48 y.o., female Today's Date: 12/15/2023  END OF SESSION:  PT End of Session - 12/15/23 0923     Visit Number 8    Number of Visits 19    Date for PT Re-Evaluation 01/19/24    Authorization Type [employer]    PT Start Time 0902    PT Stop Time 0944    PT Time Calculation (min) 42 min    Activity Tolerance Patient tolerated treatment well    Behavior During Therapy WFL for tasks assessed/performed           Past Medical History:  Diagnosis Date   Asthma    Chronic pancreatitis (HCC)    DDD (degenerative disc disease), lumbosacral    DJD (degenerative joint disease)    Headache    Hyperlipidemia    Hypertension    Hypoglycemia    Obesity    Osteoarthritis    Prolactinoma (HCC)    Sciatica    Sleep apnea    Past Surgical History:  Procedure Laterality Date   CHOLECYSTECTOMY     COLONOSCOPY     CRANIOTOMY N/A 01/04/2022   Procedure: Endonasal endoscopic resection of pituitary tumor;  Surgeon: Cheryle Debby LABOR, MD;  Location: [facility] OR;  Service: Neurosurgery;  Laterality: N/A;  RM 20   NASAL SINUS SURGERY N/A 01/04/2022   Procedure: ENDOSCOPIC SINUS SURGERY TRANSNASAL PITUITARY REMOVAL WITH NASAL NASOSETTAL FLAP;  Surgeon: Llewellyn Gerard LABOR, DO;  Location: [facility] OR;  Service: ENT;  Laterality: N/A;   Patient Active Problem List   Diagnosis Date Noted   Class 3 severe obesity due to excess calories with body mass index (BMI) of 40.0 to 44.9 in adult 03/30/2023   Hypoglycemia 06/16/2022   Status post transsphenoidal pituitary resection (HCC) 01/04/2022   Low TSH level 10/21/2021   Dyslipidemia 07/15/2021   Osteoarthritis 08/20/2020   Sciatica 07/30/2020   DJD (degenerative joint disease)    Obstructive sleep apnea 03/11/2020   Snoring 02/05/2020   Pneumonia due to COVID-19 virus 12/15/2019   Acute respiratory failure with hypoxia (HCC) 12/15/2019   Hypokalemia 12/15/2019    Asthma without acute exacerbation 12/15/2019   Encounter for management of intrauterine contraceptive device (IUD) 06/09/2019   Essential hypertension 06/09/2019   Fatigue 06/09/2019   Slow transit constipation 06/09/2019   Hyperprolactinemia (HCC) 02/15/2018   Migraine 10/12/2017   Chronic pancreatitis (HCC) 11/09/2016   Prolactinoma (HCC) 11/09/2016   BMI 38.0-38.9,adult 11/09/2016   Candida rash of groin 11/09/2016   Mild intermittent asthma 11/09/2016   Mixed hyperlipidemia 11/09/2016    PCP: Celestia Rosaline SQUIBB, NP   REFERRING PROVIDER: Teressa Rainell BROCKS, DO  REFERRING DIAG: [phone] (ICD-10-CM) - Rotator cuff tendinitis, right M17.12 (ICD-10-CM) - Primary osteoarthritis of left knee  THERAPY DIAG:  Unsteadiness on feet  Chronic pain of left knee  Chronic right shoulder pain  Rationale for Evaluation and Treatment: Rehabilitation  ONSET DATE: Acute for shoulder, chronic for knee  SUBJECTIVE:  SUBJECTIVE STATEMENT:  12/15/2023: Patient reports that she feel good after last session, without any worsening of symptoms.      Hand dominance: Right  PERTINENT HISTORY: Also with right shoulder shoulder pain for the last 3 weeks.  No injury or trauma. She does note when she gets out of bed she typically pushes up on the right side No radiation of pain No numbness or tingling Previous shoulder issues for which she did physical therapy following COVID hospitalization in 2021 She has started doing some shoulder circles and wall crawls Evaluated at urgent care 10/05/23 -X-rays were unremarkable of the shoulder - Was given a sling - Rx ibuprofen  800 mg 3 times daily as needed.  This has been somewhat helpful - Recommended to follow-up with sports medicine  Patient with left knee pain since 04/2023  and right shoulder pain x 3 weeks Left medial knee pain without any specific injury or trauma Pain along the medial knee Some occasional clicking and popping Seen in urgent care 10/05/2023 - Urgent care notes reviewed in detail during the visit today - Fitted with a knee brace - X-ray showed mild osteoarthritis Given Rx ibuprofen  800 mg 3 times daily as needed which has been somewhat helpful  PAIN:  Are you having pain? Yes: NPRS scale: see subjective/10 Pain location: L knee Pain description: ache Aggravating factors: descending stairs, prolonged walking/sitting Relieving factors: heat, topical gels  PAIN:  Are you having pain? 12/08/23 - R shoulder: 2/10 currently, 1-10/10 in past week - L knee: 1/10 currently, 1-8/10  Prior to 12/08/23 visit: NPRS scale: 10/10 Pain location: R shoulder Pain description: ache, sharp Aggravating factors: OH motions, unexpected motion Relieving factors: massage, heat/ice   PRECAUTIONS: None  RED FLAGS: None   WEIGHT BEARING RESTRICTIONS: No  FALLS:  Has patient fallen in last 6 months? No  OCCUPATION: Office work  PLOF: Independent  PATIENT GOALS:To manage my shoulder and knee pain  NEXT MD VISIT: 6 weeks  OBJECTIVE:  Note: Objective measures were completed at Evaluation unless otherwise noted.  DIAGNOSTIC FINDINGS:  Right shoulder x-ray 10/05/2023 at urgent care personally reviewed and interpreted by me showing: - Mild A/C joint degenerative changes - No other bony abnormalities   Left knee x-ray 10/05/2023 4 views AP/lateral/obliques personally reviewed and interpreted by me today showing: - Mild degenerative changes along the medial and patellofemoral compartments - No other bony abnormalities  PATIENT SURVEYS:  LEFS 16/80  12/08/23 LEFS 31/80  POSTURE: Rounded shoulders  UPPER EXTREMITY ROM:   A/PROM Right eval Left eval R 12/08/23  Shoulder flexion 90/160  A: 153 deg  Shoulder extension     Shoulder abduction  90/160    Shoulder adduction     Shoulder internal rotation     Shoulder external rotation     Elbow flexion     Elbow extension     Wrist flexion     Wrist extension     Wrist ulnar deviation     Wrist radial deviation     Wrist pronation     Wrist supination     (Blank rows = not tested)  UPPER EXTREMITY MMT:  MMT Right eval Left eval R/L 12/08/23    Shoulder flexion 4-  4+*/5  Shoulder extension     Shoulder abduction 4-  4+*/5  Shoulder adduction     Shoulder internal rotation     Shoulder external rotation     Middle trapezius     Lower trapezius  Elbow flexion     Elbow extension     Wrist flexion     Wrist extension     Wrist ulnar deviation     Wrist radial deviation     Wrist pronation     Wrist supination     Grip strength (lbs)     (Blank rows = not tested)  SHOULDER SPECIAL TESTS: Impingement tests: Neer impingement test: positive  and Hawkins/Kennedy impingement test: positive  Rotator cuff assessment: Drop arm test: negative, Empty can test: negative, and Hornblower's sign: negative   LOWER EXTREMITY MMT:    MMT Right eval Left eval R/L 12/08/23    Hip flexion     Hip extension     Hip abduction     Hip adduction     Hip internal rotation     Hip external rotation     Knee flexion  4 5/5  Knee extension  4- 5/4*  Ankle dorsiflexion     Ankle plantarflexion     Ankle inversion     Ankle eversion      (Blank rows = not tested)   LOWER EXTREMITY ROM:     Active  Right eval Left eval L 12/08/23   Hip flexion     Hip extension     Hip abduction     Hip adduction     Hip internal rotation     Hip external rotation     Knee flexion 125 135 A: 130 deg *  Knee extension     Ankle dorsiflexion     Ankle plantarflexion     Ankle inversion     Ankle eversion      (Blank rows = not tested)   LOWER EXTREMITY SPECIAL TESTS:  Knee special tests: Lateral pull sign: negative and varus/valgus stress negative for laxity  PALPATION:  TTP R  biceps tendon, AC joint   Functional testing:  30s chair stand test 0 reps   12/08/23:  - 30secSTS 4 reps (beginning ascent for fifth rep at 30sec mark), moderate exertion and knee pain                                                                                                                           TREATMENT DATE:   [facility] Adult PT Treatment:                                                DATE: 12/15/2023  Neuromuscular re-ed: Seated heel/toe raises x15 LAQ x15 Supine press/protraction 1kg ball 15x TKEs RTB 2x15  Therapeutic Activity: Nustep L4 8 min Supine flexion UE Ranger 15x Supine ER UE Ranger 15x    [facility] Adult PT Treatment:  DATE: 12/08/23 Therapeutic Exercise: Seated heel/toe raises x12 LAQ x12 Seated scap retraction x12 cues for posture HEP update + education/handout + rationale for interventions  Therapeutic Activity: MSK assessment + education 30sec STS + education Education/discussion re: progress with PT, symptom behavior as it affects activity tolerance, PT goals/POC  Education/discussion re: activity modification, pacing of tasks, frequent/short bouts of mobility and HEP to mitigate stiffness   [facility] Adult PT Treatment:                                                DATE: 11/27/23 Therapeutic Exercise: Nustep L4 8 min Neuromuscular re-ed: Supine press/protraction 500g ball 15x SAQs 2s 15x TKEs RTB 15x Therapeutic Activity: Supine flexion UE Ranger 15x Supine ER UE Ranger 15x Seated ham curls RTB 15x       PATIENT EDUCATION: Education details: rationale for interventions, HEP, PT goals/POC Person educated: Patient Education method: Explanation, Demonstration, Tactile cues, Verbal cues Education comprehension: verbalized understanding, returned demonstration, verbal cues required, tactile cues required, and needs further education     HOME EXERCISE PROGRAM: Access Code: [email or web address] URL:  [email or web address] Date: 12/08/2023 Prepared by: Alm Jenny  Exercises - Standing Isometric Shoulder External Rotation with Doorway  - 2-3 x daily - 7 x weekly - 1 sets - 5 reps - Standing Isometric Shoulder Internal Rotation at Doorway  - 2-3 x daily - 7 x weekly - 1 sets - 5 reps - Standing 'L' Stretch at Counter  - 2-3 x daily - 7 x weekly - 1 sets - 5 reps - Seated Heel Toe Raises  - 2-3 x daily - 7 x weekly - 1 sets - 10 reps - Seated Long Arc Quad  - 2-3 x daily - 7 x weekly - 1 sets - 10 reps  ASSESSMENT:   CLINICAL IMPRESSION:  12/15/2023: Tavionna had good tolerance of today's treatment session, which focused on ongoing ROM and LE neuroreeducation. She was able to tolerate additional resistance during today's visit and reports intention to begin increasing resistance at home as tolerated. We will continue to progress per POC as tolerated, in order to reach established rehab goals.    RECERT 12/08/2023: Pt arrives w/ report of 1-2/10 pain for shoulder/knee, improvement since fall but symptoms remain exacerbated; received MRI results which were reassuring, refer to [facility] for details. Looking at goals, pt demonstrates notable improvements in MMT/ROM and 30sec STS despite symptom exacerbation post fall, although deficits do persist and she has not yet fully met LTG. Continues to require rest breaks to maximize tolerance. HEP update today with emphasis on strategies to mitigate stiffness with prolonged sitting. Recommend extension of POC to address relevant deficits, minimize pain, and maximize functional tolerance. No adverse events, pt reports some UT soreness on departure but otherwise no change in symptoms. Pt departs today's session in no acute distress, all voiced questions/concerns addressed appropriately from PT perspective.       OBJECTIVE IMPAIRMENTS: Abnormal gait, decreased activity tolerance, decreased endurance, decreased knowledge of condition, decreased  knowledge of use of DME, decreased mobility, difficulty walking, decreased ROM, decreased strength, impaired perceived functional ability, impaired UE functional use, postural dysfunction, obesity, and pain.   ACTIVITY LIMITATIONS: carrying, lifting, sitting, standing, squatting, stairs, dressing, reach over head, and locomotion level  PERSONAL FACTORS: Age, Behavior pattern, Fitness, Past/current experiences, Time since onset of injury/illness/exacerbation, and 1 comorbidity: Hx  of long Covid are also affecting patient's functional outcome.   REHAB POTENTIAL: Good  CLINICAL DECISION MAKING: Stable/uncomplicated  EVALUATION COMPLEXITY: Moderate   GOALS: Goals reviewed with patient? No  SHORT TERM GOALS: Target date: 11/14/2023    Patient to demonstrate independence in HEP  Baseline: TBD due to time constraints 11/16/23: reports good HEP adherence Goal status: MET  2.  Patient will increase AROM R shoulder to 160d flexion an abduction Baseline:  A/PROM Right eval Left eval  Shoulder flexion 90/160   Shoulder extension    Shoulder abduction 90/160    11/16/23: gross observation ~140 deg AAROM against gravity 12/08/23: 153 deg Goal status: PROGRESSING  3.  Increase L knee flexion to 130d Baseline: 125d 12/08/23: 130 deg painful Goal status: MET    LONG TERM GOALS: Target date: 01/19/2024  (Updated 12/08/23)    Patient will acknowledge 6/10 pain at least once during episode of care  in L knee and R shoulder Baseline: 10/10 12/08/23:  R shoulder: 2/10 currently, 1-10/10 in past week; L knee: 1/10 currently, 1-8/10 Goal status: MET  2.  Patient will score at least 45/80 on LEFS to signify clinically meaningful improvement in functional abilities.   Baseline: 16/80 12/08/23: 31/80 (meeting initial goal) Goal status: NEW/UPDATED 12/08/23  3.  Patient will increase 30s chair stand reps from 0 to 5 without arms to demonstrate and improved functional ability with less  pain/difficulty as well as reduce fall risk.  Baseline:  12/08/23: 4 reps w/ exertion/pain Goal status: PROGRESSING  4.  Increase R shoulder strength to 4/5 Baseline:  MMT Right eval Left eval  Shoulder flexion 4-   Shoulder extension    Shoulder abduction 4-    12/08/23: see MMT chart above Goal status: MET  5.  Increase L knee strength to 4+/5 Baseline:  MMT Right eval Left eval  Hip flexion    Hip extension    Hip abduction    Hip adduction    Hip internal rotation    Hip external rotation    Knee flexion  4  Knee extension  4-   12/08/23: see MMT chart above Goal status: PROGRESSING    PLAN: (updated 12/08/23)  PT FREQUENCY: 1-2x/week  PT DURATION: 6 weeks  PLANNED INTERVENTIONS: 97110-Therapeutic exercises, 97530- Therapeutic activity, 97112- Neuromuscular re-education, 97535- Self Care, [id number]- Manual therapy, [phone]- Gait training, and Patient/Family education  PLAN FOR NEXT SESSION: HEP review and update, manual techniques as appropriate, aerobic tasks, ROM and flexibility activities, strengthening and PREs, TPDN, gait and balance training as needed     Marko Molt, PT, DPT  12/15/2023 9:53 AM

## 2023-12-18 ENCOUNTER — Ambulatory Visit

## 2023-12-18 DIAGNOSIS — R2681 Unsteadiness on feet: Secondary | ICD-10-CM

## 2023-12-18 DIAGNOSIS — M25511 Pain in right shoulder: Secondary | ICD-10-CM | POA: Diagnosis not present

## 2023-12-18 DIAGNOSIS — G8929 Other chronic pain: Secondary | ICD-10-CM

## 2023-12-18 DIAGNOSIS — M25562 Pain in left knee: Secondary | ICD-10-CM | POA: Diagnosis not present

## 2023-12-18 NOTE — Therapy (Signed)
 OUTPATIENT PHYSICAL THERAPY NOTE   Patient Name: Beth [name] MRN: [id number] DOB:[birth year], 48 y.o., female Today's Date: 12/18/2023  END OF SESSION:  PT End of Session - 12/18/23 1857     Visit Number 9    Number of Visits 19    Date for PT Re-Evaluation 01/19/24    Authorization Type [employer]    PT Start Time [phone]    PT Stop Time 0630    PT Time Calculation (min) 42 min    Activity Tolerance Patient tolerated treatment well    Behavior During Therapy WFL for tasks assessed/performed            Past Medical History:  Diagnosis Date   Asthma    Chronic pancreatitis (HCC)    DDD (degenerative disc disease), lumbosacral    DJD (degenerative joint disease)    Headache    Hyperlipidemia    Hypertension    Hypoglycemia    Obesity    Osteoarthritis    Prolactinoma (HCC)    Sciatica    Sleep apnea    Past Surgical History:  Procedure Laterality Date   CHOLECYSTECTOMY     COLONOSCOPY     CRANIOTOMY N/A 01/04/2022   Procedure: Endonasal endoscopic resection of pituitary tumor;  Surgeon: Cheryle Debby LABOR, MD;  Location: [facility] OR;  Service: Neurosurgery;  Laterality: N/A;  RM 20   NASAL SINUS SURGERY N/A 01/04/2022   Procedure: ENDOSCOPIC SINUS SURGERY TRANSNASAL PITUITARY REMOVAL WITH NASAL NASOSETTAL FLAP;  Surgeon: Llewellyn Gerard LABOR, DO;  Location: [facility] OR;  Service: ENT;  Laterality: N/A;   Patient Active Problem List   Diagnosis Date Noted   Class 3 severe obesity due to excess calories with body mass index (BMI) of 40.0 to 44.9 in adult 03/30/2023   Hypoglycemia 06/16/2022   Status post transsphenoidal pituitary resection (HCC) 01/04/2022   Low TSH level 10/21/2021   Dyslipidemia 07/15/2021   Osteoarthritis 08/20/2020   Sciatica 07/30/2020   DJD (degenerative joint disease)    Obstructive sleep apnea 03/11/2020   Snoring 02/05/2020   Pneumonia due to COVID-19 virus 12/15/2019   Acute respiratory failure with hypoxia (HCC) 12/15/2019   Hypokalemia 12/15/2019    Asthma without acute exacerbation 12/15/2019   Encounter for management of intrauterine contraceptive device (IUD) 06/09/2019   Essential hypertension 06/09/2019   Fatigue 06/09/2019   Slow transit constipation 06/09/2019   Hyperprolactinemia (HCC) 02/15/2018   Migraine 10/12/2017   Chronic pancreatitis (HCC) 11/09/2016   Prolactinoma (HCC) 11/09/2016   BMI 38.0-38.9,adult 11/09/2016   Candida rash of groin 11/09/2016   Mild intermittent asthma 11/09/2016   Mixed hyperlipidemia 11/09/2016    PCP: Celestia Rosaline SQUIBB, NP   REFERRING PROVIDER: Teressa Rainell BROCKS, DO  REFERRING DIAG: [phone] (ICD-10-CM) - Rotator cuff tendinitis, right M17.12 (ICD-10-CM) - Primary osteoarthritis of left knee  THERAPY DIAG:  Unsteadiness on feet  Chronic pain of left knee  Chronic right shoulder pain  Rationale for Evaluation and Treatment: Rehabilitation  ONSET DATE: Acute for shoulder, chronic for knee  SUBJECTIVE:  SUBJECTIVE STATEMENT:  12/18/2023: Patient reports that she fel good after last session, without any worsening of symptoms.      Hand dominance: Right  PERTINENT HISTORY: Also with right shoulder shoulder pain for the last 3 weeks.  No injury or trauma. She does note when she gets out of bed she typically pushes up on the right side No radiation of pain No numbness or tingling Previous shoulder issues for which she did physical therapy following COVID hospitalization in 2021 She has started doing some shoulder circles and wall crawls Evaluated at urgent care 10/05/23 -X-rays were unremarkable of the shoulder - Was given a sling - Rx ibuprofen  800 mg 3 times daily as needed.  This has been somewhat helpful - Recommended to follow-up with sports medicine  Patient with left knee pain since 04/2023  and right shoulder pain x 3 weeks Left medial knee pain without any specific injury or trauma Pain along the medial knee Some occasional clicking and popping Seen in urgent care 10/05/2023 - Urgent care notes reviewed in detail during the visit today - Fitted with a knee brace - X-ray showed mild osteoarthritis Given Rx ibuprofen  800 mg 3 times daily as needed which has been somewhat helpful  PAIN:  Are you having pain? Yes: NPRS scale: see subjective/10 Pain location: L knee Pain description: ache Aggravating factors: descending stairs, prolonged walking/sitting Relieving factors: heat, topical gels  PAIN:  Are you having pain? 12/08/23 - R shoulder: 2/10 currently, 1-10/10 in past week - L knee: 1/10 currently, 1-8/10  Prior to 12/08/23 visit: NPRS scale: 10/10 Pain location: R shoulder Pain description: ache, sharp Aggravating factors: OH motions, unexpected motion Relieving factors: massage, heat/ice   PRECAUTIONS: None  RED FLAGS: None   WEIGHT BEARING RESTRICTIONS: No  FALLS:  Has patient fallen in last 6 months? No  OCCUPATION: Office work  PLOF: Independent  PATIENT GOALS:To manage my shoulder and knee pain  NEXT MD VISIT: 6 weeks  OBJECTIVE:  Note: Objective measures were completed at Evaluation unless otherwise noted.  DIAGNOSTIC FINDINGS:  Right shoulder x-ray 10/05/2023 at urgent care personally reviewed and interpreted by me showing: - Mild A/C joint degenerative changes - No other bony abnormalities   Left knee x-ray 10/05/2023 4 views AP/lateral/obliques personally reviewed and interpreted by me today showing: - Mild degenerative changes along the medial and patellofemoral compartments - No other bony abnormalities  PATIENT SURVEYS:  LEFS 16/80  12/08/23 LEFS 31/80  POSTURE: Rounded shoulders  UPPER EXTREMITY ROM:   A/PROM Right eval Left eval R 12/08/23  Shoulder flexion 90/160  A: 153 deg  Shoulder extension     Shoulder abduction  90/160    Shoulder adduction     Shoulder internal rotation     Shoulder external rotation     Elbow flexion     Elbow extension     Wrist flexion     Wrist extension     Wrist ulnar deviation     Wrist radial deviation     Wrist pronation     Wrist supination     (Blank rows = not tested)  UPPER EXTREMITY MMT:  MMT Right eval Left eval R/L 12/08/23    Shoulder flexion 4-  4+*/5  Shoulder extension     Shoulder abduction 4-  4+*/5  Shoulder adduction     Shoulder internal rotation     Shoulder external rotation     Middle trapezius     Lower trapezius  Elbow flexion     Elbow extension     Wrist flexion     Wrist extension     Wrist ulnar deviation     Wrist radial deviation     Wrist pronation     Wrist supination     Grip strength (lbs)     (Blank rows = not tested)  SHOULDER SPECIAL TESTS: Impingement tests: Neer impingement test: positive  and Hawkins/Kennedy impingement test: positive  Rotator cuff assessment: Drop arm test: negative, Empty can test: negative, and Hornblower's sign: negative   LOWER EXTREMITY MMT:    MMT Right eval Left eval R/L 12/08/23    Hip flexion     Hip extension     Hip abduction     Hip adduction     Hip internal rotation     Hip external rotation     Knee flexion  4 5/5  Knee extension  4- 5/4*  Ankle dorsiflexion     Ankle plantarflexion     Ankle inversion     Ankle eversion      (Blank rows = not tested)   LOWER EXTREMITY ROM:     Active  Right eval Left eval L 12/08/23   Hip flexion     Hip extension     Hip abduction     Hip adduction     Hip internal rotation     Hip external rotation     Knee flexion 125 135 A: 130 deg *  Knee extension     Ankle dorsiflexion     Ankle plantarflexion     Ankle inversion     Ankle eversion      (Blank rows = not tested)   LOWER EXTREMITY SPECIAL TESTS:  Knee special tests: Lateral pull sign: negative and varus/valgus stress negative for laxity  PALPATION:  TTP R  biceps tendon, AC joint   Functional testing:  30s chair stand test 0 reps   12/08/23:  - 30secSTS 4 reps (beginning ascent for fifth rep at 30sec mark), moderate exertion and knee pain                                                                                                                           TREATMENT DATE:    [facility] Adult PT Treatment:                                                DATE: 12/18/2023  Neuromuscular re-ed: Seated heel/toe raises 2x15 into BOSU LAQ 2x15 2lb Ball squeeze + LAQ 2 x 5, 2lb - LEFT only  Supine press/protraction 1kg ball 2x15 TKEs GTB 2x15 each  Therapeutic Activity: Nustep L4 9 min  [facility] Adult PT Treatment:  DATE: 12/15/2023  Neuromuscular re-ed: Seated heel/toe raises x15 LAQ x15 Supine press/protraction 1kg ball 15x TKEs RTB 2x15  Therapeutic Activity: Nustep L4 8 min Supine flexion UE Ranger 15x Supine ER UE Ranger 15x    [facility] Adult PT Treatment:                                                DATE: 12/08/23 Therapeutic Exercise: Seated heel/toe raises x12 LAQ x12 Seated scap retraction x12 cues for posture HEP update + education/handout + rationale for interventions  Therapeutic Activity: MSK assessment + education 30sec STS + education Education/discussion re: progress with PT, symptom behavior as it affects activity tolerance, PT goals/POC  Education/discussion re: activity modification, pacing of tasks, frequent/short bouts of mobility and HEP to mitigate stiffness   [facility] Adult PT Treatment:                                                DATE: 11/27/23 Therapeutic Exercise: Nustep L4 8 min Neuromuscular re-ed: Supine press/protraction 500g ball 15x SAQs 2s 15x TKEs RTB 15x Therapeutic Activity: Supine flexion UE Ranger 15x Supine ER UE Ranger 15x Seated ham curls RTB 15x       PATIENT EDUCATION: Education details: rationale for interventions, HEP, PT  goals/POC Person educated: Patient Education method: Explanation, Demonstration, Tactile cues, Verbal cues Education comprehension: verbalized understanding, returned demonstration, verbal cues required, tactile cues required, and needs further education     HOME EXERCISE PROGRAM: Access Code: [email or web address] URL: [email or web address] Date: 12/08/2023 Prepared by: Alm Jenny  Exercises - Standing Isometric Shoulder External Rotation with Doorway  - 2-3 x daily - 7 x weekly - 1 sets - 5 reps - Standing Isometric Shoulder Internal Rotation at Doorway  - 2-3 x daily - 7 x weekly - 1 sets - 5 reps - Standing 'L' Stretch at Counter  - 2-3 x daily - 7 x weekly - 1 sets - 5 reps - Seated Heel Toe Raises  - 2-3 x daily - 7 x weekly - 1 sets - 10 reps - Seated Long Arc Quad  - 2-3 x daily - 7 x weekly - 1 sets - 10 reps  ASSESSMENT:   CLINICAL IMPRESSION:  12/18/2023: Beth [name] had good tolerance of today's treatment session, with increased repetitions of UE and LE neuroreed program. She was able to tolerate additional resistance during today's visit with TKEs tolerated. We will continue to progress per POC as tolerated, in order to reach established rehab goals.    RECERT 12/08/2023: Pt arrives w/ report of 1-2/10 pain for shoulder/knee, improvement since fall but symptoms remain exacerbated; received MRI results which were reassuring, refer to [facility] for details. Looking at goals, pt demonstrates notable improvements in MMT/ROM and 30sec STS despite symptom exacerbation post fall, although deficits do persist and she has not yet fully met LTG. Continues to require rest breaks to maximize tolerance. HEP update today with emphasis on strategies to mitigate stiffness with prolonged sitting. Recommend extension of POC to address relevant deficits, minimize pain, and maximize functional tolerance. No adverse events, pt reports some UT soreness on departure but otherwise no change in symptoms. Pt  departs today's session in no acute distress, all voiced questions/concerns addressed appropriately from  PT perspective.       OBJECTIVE IMPAIRMENTS: Abnormal gait, decreased activity tolerance, decreased endurance, decreased knowledge of condition, decreased knowledge of use of DME, decreased mobility, difficulty walking, decreased ROM, decreased strength, impaired perceived functional ability, impaired UE functional use, postural dysfunction, obesity, and pain.   ACTIVITY LIMITATIONS: carrying, lifting, sitting, standing, squatting, stairs, dressing, reach over head, and locomotion level  PERSONAL FACTORS: Age, Behavior pattern, Fitness, Past/current experiences, Time since onset of injury/illness/exacerbation, and 1 comorbidity: Hx of long Covid are also affecting patient's functional outcome.   REHAB POTENTIAL: Good  CLINICAL DECISION MAKING: Stable/uncomplicated  EVALUATION COMPLEXITY: Moderate   GOALS: Goals reviewed with patient? No  SHORT TERM GOALS: Target date: 11/14/2023    Patient to demonstrate independence in HEP  Baseline: TBD due to time constraints 11/16/23: reports good HEP adherence Goal status: MET  2.  Patient will increase AROM R shoulder to 160d flexion an abduction Baseline:  A/PROM Right eval Left eval  Shoulder flexion 90/160   Shoulder extension    Shoulder abduction 90/160    11/16/23: gross observation ~140 deg AAROM against gravity 12/08/23: 153 deg Goal status: PROGRESSING  3.  Increase L knee flexion to 130d Baseline: 125d 12/08/23: 130 deg painful Goal status: MET    LONG TERM GOALS: Target date: 01/19/2024  (Updated 12/08/23)    Patient will acknowledge 6/10 pain at least once during episode of care  in L knee and R shoulder Baseline: 10/10 12/08/23:  R shoulder: 2/10 currently, 1-10/10 in past week; L knee: 1/10 currently, 1-8/10 Goal status: MET  2.  Patient will score at least 45/80 on LEFS to signify clinically meaningful  improvement in functional abilities.   Baseline: 16/80 12/08/23: 31/80 (meeting initial goal) Goal status: NEW/UPDATED 12/08/23  3.  Patient will increase 30s chair stand reps from 0 to 5 without arms to demonstrate and improved functional ability with less pain/difficulty as well as reduce fall risk.  Baseline:  12/08/23: 4 reps w/ exertion/pain Goal status: PROGRESSING  4.  Increase R shoulder strength to 4/5 Baseline:  MMT Right eval Left eval  Shoulder flexion 4-   Shoulder extension    Shoulder abduction 4-    12/08/23: see MMT chart above Goal status: MET  5.  Increase L knee strength to 4+/5 Baseline:  MMT Right eval Left eval  Hip flexion    Hip extension    Hip abduction    Hip adduction    Hip internal rotation    Hip external rotation    Knee flexion  4  Knee extension  4-   12/08/23: see MMT chart above Goal status: PROGRESSING    PLAN: (updated 12/08/23)  PT FREQUENCY: 1-2x/week  PT DURATION: 6 weeks  PLANNED INTERVENTIONS: 97110-Therapeutic exercises, 97530- Therapeutic activity, 97112- Neuromuscular re-education, 97535- Self Care, 02859- Manual therapy, [phone]- Gait training, and Patient/Family education  PLAN FOR NEXT SESSION: HEP review and update, manual techniques as appropriate, aerobic tasks, ROM and flexibility activities, strengthening and PREs, TPDN, gait and balance training as needed     Marko Molt, PT, DPT  12/18/2023 7:01 PM

## 2023-12-19 NOTE — Patient Outreach (Signed)
 Complex Care Management   Visit Note  12/14/2023  Name:  Beth [name] MRN: [id number] DOB: 12/19/1975  Situation: Referral received for Complex Care Management related to Stress I obtained verbal consent from Patient.  Visit completed with pt  on the phone  Background:   Past Medical History:  Diagnosis Date   Asthma    Chronic pancreatitis (HCC)    DDD (degenerative disc disease), lumbosacral    DJD (degenerative joint disease)    Headache    Hyperlipidemia    Hypertension    Hypoglycemia    Obesity    Osteoarthritis    Prolactinoma (HCC)    Sciatica    Sleep apnea     Assessment: Patient Reported Symptoms:  Cognitive Cognitive Status: Alert and oriented to person, place, and time, Normal speech and language skills Cognitive/Intellectual Conditions Management [RPT]: None reported or documented in medical history or problem list   Health Maintenance Behaviors: Annual physical exam, Stress management  Neurological Neurological Review of Symptoms: Not assessed    HEENT HEENT Symptoms Reported: Not assessed      Cardiovascular Cardiovascular Symptoms Reported: Not assessed    Respiratory Respiratory Symptoms Reported: Not assesed    Endocrine Patient reports the following symptoms related to hypoglycemia or hyperglycemia : Not assessed    Gastrointestinal Gastrointestinal Symptoms Reported: Not assessed      Genitourinary Genitourinary Symptoms Reported: Not assessed    Integumentary Integumentary Symptoms Reported: Not assessed    Musculoskeletal Musculoskelatal Symptoms Reviewed: Not assessed        Psychosocial Psychosocial Symptoms Reported: Other Other Psychosocial Conditions: Stress Additional Psychological Details: Pt endorses stress of managing chronic health conditions that has impacted mobility and vision. Validation and encouragement provided. Healthy coping skills and solution focused strategies discussed to strengthen support for pt Behavioral  Management Strategies: Adequate rest, Coping strategies, Support system Major Change/Loss/Stressor/Fears (CP): Medical condition, self Quality of Family Relationships: supportive      11/16/2023   11:29 AM  Depression screen PHQ 2/9  Decreased Interest 1  Down, Depressed, Hopeless 1  PHQ - 2 Score 2  Altered sleeping 2  Change in appetite 2  Feeling bad or failure about yourself  2  Trouble concentrating 1  Moving slowly or fidgety/restless 0  Suicidal thoughts 0  PHQ-9 Score 9  Difficult doing work/chores Somewhat difficult    There were no vitals filed for this visit.  Medications Reviewed Today     Reviewed by Demetria Iwai D, LCSW (Social Worker) on 12/14/23 at 1533  Med List Status: <None>   Medication Order Taking? Sig Documenting Provider Last Dose Status Informant  albuterol  (VENTOLIN  HFA) 108 (90 Base) MCG/ACT inhaler [id number] No Inhale 2 puffs into the lungs every 6 (six) hours as needed for wheezing or shortness of breath. Shelah Lamar RAMAN, MD Taking Active   amoxicillin  (AMOXIL ) 875 MG tablet [id number] No Take 1 tablet (875 mg total) by mouth 2 (two) times daily.  Patient not taking: Reported on 11/16/2023   Arloa Suzen RAMAN, NP Not Taking Active   Ascorbic Acid  (VITAMIN C) 1000 MG tablet [id number] No Take 1,000 mg by mouth daily. [provider] Taking Active Self  benzonatate  (TESSALON ) 200 MG capsule [id number] No Take 1 capsule (200 mg total) by mouth 3 (three) times daily as needed for cough. Shelah Lamar RAMAN, MD Taking Active   Butenafine  HCl (MENTAX) 1 % cream [id number] No Apply 1 application topically 2 (two) times daily.  Patient taking differently: Apply 1 application  topically  as needed.   Celestia Rosaline SQUIBB, NP Taking Active Self  Cholecalciferol (VITAMIN D3 PO) [id number] No Take 2,000 mg by mouth daily. On Hold [provider] Taking Active Self  Docusate Sodium  (COLACE PO) [id number] No Take 1 capsule by mouth daily. [provider] Taking Active Self  EPINEPHrine  0.3 mg/0.3 mL IJ SOAJ injection [id number] No Inject 0.3 mg into the muscle as needed for anaphylaxis. [provider] Unknown Active Self           Med Note SOILA LYLE JAYSON Pablo Dec 26, 2021  9:08 AM) Pt needs a refill, current pen is expired  ergocalciferol  (VITAMIN D2) 1.25 MG (50000 UT) capsule [id number] No Take 1 capsule (50,000 Units total) by mouth once a week.  Patient not taking: Reported on 11/16/2023   Celestia Rosaline SQUIBB, NP Not Taking Active   fenofibrate  (TRICOR ) 145 MG tablet [id number] No Take 1 tablet (145 mg total) by mouth daily. Celestia Rosaline SQUIBB, NP Taking Active   fluticasone  (FLONASE ) 50 MCG/ACT nasal spray [id number] No Place 2 sprays into both nostrils daily. Shelah Lamar RAMAN, MD Taking Active Self  ibuprofen  (ADVIL ) 800 MG tablet [id number] No Take 1 tablet (800 mg total) by mouth 3 (three) times daily. Reddick, Ethel B, NP Taking Active   ipratropium-albuterol  (DUONEB) 0.5-2.5 (3) MG/3ML SOLN [id number] No Take 3 mLs by nebulization every 6 (six) hours as needed (shortness of breath, wheezing, and persistent cough). Arloa Suzen RAMAN, NP Taking Active   levonorgestrel  (MIRENA ) 20 MCG/24HR IUD [id number] No 1 each by Intrauterine route once.  [provider] Taking Active Self  LORazepam  (ATIVAN ) 1 MG tablet [id number] No Take 1 tablet (1 mg total) by mouth every 8 (eight) hours as needed for anxiety (mri). Vaslow, Zachary K, MD Taking Active   Menthol, Topical Analgesic, (BIOFREEZE) 10 % CREA [id number] No Apply 1 Application topically daily as needed (pain). [provider] Taking Active Self  ondansetron  (ZOFRAN -ODT) 8 MG disintegrating tablet [id number] No Take 1 tablet (8 mg total) by mouth every 8 (eight) hours as needed for nausea or vomiting. Izell Domino, MD Taking Active   oseltamivir  (TAMIFLU ) 75 MG capsule [id number] No Take 1 capsule (75 mg total) by mouth 2 (two) times daily.  Patient not  taking: Reported on 11/16/2023   Arloa Suzen RAMAN, NP Not Taking Active   Pancrelipase , Lip-Prot-Amyl, (CREON ) 24000-76000 units CPEP [id number] No Take 2 capsules by mouth See admin instructions. Take 2 capsules three times daily with food and 1 capsule twice daily with snacks.  Patient not taking: Reported on 11/16/2023   [provider] Not Taking Active Self  pantoprazole  (PROTONIX ) 20 MG tablet [id number] No Take 20 mg by mouth daily. [provider] Taking Active Self  promethazine -dextromethorphan  (PROMETHAZINE -DM) 6.25-15 MG/5ML syrup [id number] No Take 5 mLs by mouth 3 (three) times daily as needed for cough. Arloa Suzen RAMAN, NP Taking Active   psyllium (REGULOID) 0.52 g capsule [id number] No Take 0.52 capsules by mouth as needed. [provider] Taking Active Self  rosuvastatin  (CRESTOR ) 40 MG tablet [id number] No Take 1 tablet (40 mg total) by mouth daily. Celestia Rosaline SQUIBB, NP Taking Active   Zinc  Sulfate (ZINC  15 PO) [id number] No Take 15 mg by mouth daily. [provider] Taking Active Self            Recommendation:   Continue Current Plan of Care  Follow Up Plan:   Telephone follow-up in 1 month  Rolin Kerns, LCSW  [facility]  [facility] Clinical Social Worker Direct Dial: [phone]  Fax: [phone] Website: [email or web address] 4:07 PM

## 2023-12-19 NOTE — Patient Instructions (Signed)
 Visit Information  Thank you for taking time to visit with me today. Please don't hesitate to contact me if I can be of assistance to you before our next scheduled appointment.  Our next appointment is by telephone on 07/11 at 3 PM Please call the care guide team at [phone] if you need to cancel or reschedule your appointment.   Following is a copy of your care plan:   Goals Addressed             This Visit's Progress    LCSW VBCI Social Work Care Plan   On track    Problems:   Disease Management support and education needs related to Stress at managing chronic health conditions  CSW Clinical Goal(s):   Over the next 90 days the Patient will attend all scheduled medical appointments as evidenced by patient report and care team review of appointment completion in electronic MEDICAL RECORD NUMBER  demonstrate a reduction in symptoms related to Stress at managing chronic health conditions .  Interventions:  Mental Health:  Evaluation of current treatment plan related to Stress at managing chronic health conditions Active listening / Reflection utilized Financial risk analyst / information provided Emotional Support Provided Participation in counseling encourage : Patient is not interested at this time Participation in support group encouraged : Patient is interested. LCSW will collaborate with RNCM and look into appropriate groups to provide pt for review Provided general psycho-education for mental health needs Solution-Focued Strategies employed:  Patient Goals/Self-Care Activities:  Continue taking your medication as prescribed.   Increase coping skills, healthy habits, and stress reduction  Plan:   Telephone follow up appointment with care management team member scheduled for:  2-4 weeks        Please call the Suicide and Crisis Lifeline: 988 go to [facility] [address] [place] [phone]) call 911 if you are  experiencing a Mental Health or Behavioral Health Crisis or need someone to talk to.  Patient verbalizes understanding of instructions and care plan provided today and agrees to view in MyChart. Active MyChart status and patient understanding of how to access instructions and care plan via MyChart confirmed with patient.     Rolin Ezzard HUGHS [facility]  [facility], [facility] Clinical Social Worker Direct Dial: [phone]  Fax: [phone] Website: [email or web address] 4:08 PM

## 2023-12-20 ENCOUNTER — Ambulatory Visit

## 2023-12-20 DIAGNOSIS — G8929 Other chronic pain: Secondary | ICD-10-CM

## 2023-12-20 DIAGNOSIS — R2681 Unsteadiness on feet: Secondary | ICD-10-CM | POA: Diagnosis not present

## 2023-12-20 DIAGNOSIS — M25511 Pain in right shoulder: Secondary | ICD-10-CM | POA: Diagnosis not present

## 2023-12-20 DIAGNOSIS — M25562 Pain in left knee: Secondary | ICD-10-CM | POA: Diagnosis not present

## 2023-12-20 NOTE — Therapy (Signed)
 OUTPATIENT PHYSICAL THERAPY NOTE   Patient Name: Beth [name] MRN: [id number] DOB:May 25, 1976, 48 y.o., female Today's Date: 12/20/2023  END OF SESSION:  PT End of Session - 12/20/23 1715     Visit Number 10    Number of Visits 19    Date for PT Re-Evaluation 01/19/24    Authorization Type BCBS   Start Time 1705 Stop Time 1745  PT Time Calculation 40 min        Past Medical History:  Diagnosis Date   Asthma    Chronic pancreatitis (HCC)    DDD (degenerative disc disease), lumbosacral    DJD (degenerative joint disease)    Headache    Hyperlipidemia    Hypertension    Hypoglycemia    Obesity    Osteoarthritis    Prolactinoma (HCC)    Sciatica    Sleep apnea    Past Surgical History:  Procedure Laterality Date   CHOLECYSTECTOMY     COLONOSCOPY     CRANIOTOMY N/A 01/04/2022   Procedure: Endonasal endoscopic resection of pituitary tumor;  Surgeon: Cheryle Debby LABOR, MD;  Location: [facility] OR;  Service: Neurosurgery;  Laterality: N/A;  RM 20   NASAL SINUS SURGERY N/A 01/04/2022   Procedure: ENDOSCOPIC SINUS SURGERY TRANSNASAL PITUITARY REMOVAL WITH NASAL NASOSETTAL FLAP;  Surgeon: Llewellyn Gerard LABOR, DO;  Location: [facility] OR;  Service: ENT;  Laterality: N/A;   Patient Active Problem List   Diagnosis Date Noted   Class 3 severe obesity due to excess calories with body mass index (BMI) of 40.0 to 44.9 in adult 03/30/2023   Hypoglycemia 06/16/2022   Status post transsphenoidal pituitary resection (HCC) 01/04/2022   Low TSH level 10/21/2021   Dyslipidemia 07/15/2021   Osteoarthritis 08/20/2020   Sciatica 07/30/2020   DJD (degenerative joint disease)    Obstructive sleep apnea 03/11/2020   Snoring 02/05/2020   Pneumonia due to COVID-19 virus 12/15/2019   Acute respiratory failure with hypoxia (HCC) 12/15/2019   Hypokalemia 12/15/2019   Asthma without acute exacerbation 12/15/2019   Encounter for management of intrauterine contraceptive device (IUD) 06/09/2019    Essential hypertension 06/09/2019   Fatigue 06/09/2019   Slow transit constipation 06/09/2019   Hyperprolactinemia (HCC) 02/15/2018   Migraine 10/12/2017   Chronic pancreatitis (HCC) 11/09/2016   Prolactinoma (HCC) 11/09/2016   BMI 38.0-38.9,adult 11/09/2016   Candida rash of groin 11/09/2016   Mild intermittent asthma 11/09/2016   Mixed hyperlipidemia 11/09/2016    PCP: Celestia Rosaline SQUIBB, NP   REFERRING PROVIDER: Teressa Rainell BROCKS, DO  REFERRING DIAG: [phone] (ICD-10-CM) - Rotator cuff tendinitis, right M17.12 (ICD-10-CM) - Primary osteoarthritis of left knee  THERAPY DIAG:  Unsteadiness on feet  Chronic pain of left knee  Chronic right shoulder pain  Rationale for Evaluation and Treatment: Rehabilitation  ONSET DATE: Acute for shoulder, chronic for knee  SUBJECTIVE:  SUBJECTIVE STATEMENT:  12/20/2023: Patient reports that she fel good after last session, without any worsening of symptoms.      Hand dominance: Right  PERTINENT HISTORY: Also with right shoulder shoulder pain for the last 3 weeks.  No injury or trauma. She does note when she gets out of bed she typically pushes up on the right side No radiation of pain No numbness or tingling Previous shoulder issues for which she did physical therapy following COVID hospitalization in 2021 She has started doing some shoulder circles and wall crawls Evaluated at urgent care 10/05/23 -X-rays were unremarkable of the shoulder - Was given a sling - Rx ibuprofen  800 mg 3 times daily as needed.  This has been somewhat helpful - Recommended to follow-up with sports medicine  Patient with left knee pain since 04/2023 and right shoulder pain x 3 weeks Left medial knee pain without any specific injury or trauma Pain along the medial knee Some  occasional clicking and popping Seen in urgent care 10/05/2023 - Urgent care notes reviewed in detail during the visit today - Fitted with a knee brace - X-ray showed mild osteoarthritis Given Rx ibuprofen  800 mg 3 times daily as needed which has been somewhat helpful  PAIN:  Are you having pain? Yes: NPRS scale: see subjective/10 Pain location: L knee Pain description: ache Aggravating factors: descending stairs, prolonged walking/sitting Relieving factors: heat, topical gels  PAIN:  Are you having pain? 12/08/23 - R shoulder: 2/10 currently, 1-10/10 in past week - L knee: 1/10 currently, 1-8/10  Prior to 12/08/23 visit: NPRS scale: 10/10 Pain location: R shoulder Pain description: ache, sharp Aggravating factors: OH motions, unexpected motion Relieving factors: massage, heat/ice   PRECAUTIONS: None  RED FLAGS: None   WEIGHT BEARING RESTRICTIONS: No  FALLS:  Has patient fallen in last 6 months? No  OCCUPATION: Office work  PLOF: Independent  PATIENT GOALS:To manage my shoulder and knee pain  NEXT MD VISIT: 6 weeks  OBJECTIVE:  Note: Objective measures were completed at Evaluation unless otherwise noted.  DIAGNOSTIC FINDINGS:  Right shoulder x-ray 10/05/2023 at urgent care personally reviewed and interpreted by me showing: - Mild A/C joint degenerative changes - No other bony abnormalities   Left knee x-ray 10/05/2023 4 views AP/lateral/obliques personally reviewed and interpreted by me today showing: - Mild degenerative changes along the medial and patellofemoral compartments - No other bony abnormalities  PATIENT SURVEYS:  LEFS 16/80  12/08/23 LEFS 31/80  POSTURE: Rounded shoulders  UPPER EXTREMITY ROM:   A/PROM Right eval Left eval R 12/08/23  Shoulder flexion 90/160  A: 153 deg  Shoulder extension     Shoulder abduction 90/160    Shoulder adduction     Shoulder internal rotation     Shoulder external rotation     Elbow flexion     Elbow  extension     Wrist flexion     Wrist extension     Wrist ulnar deviation     Wrist radial deviation     Wrist pronation     Wrist supination     (Blank rows = not tested)  UPPER EXTREMITY MMT:  MMT Right eval Left eval R/L 12/08/23    Shoulder flexion 4-  4+*/5  Shoulder extension     Shoulder abduction 4-  4+*/5  Shoulder adduction     Shoulder internal rotation     Shoulder external rotation     Middle trapezius     Lower trapezius  Elbow flexion     Elbow extension     Wrist flexion     Wrist extension     Wrist ulnar deviation     Wrist radial deviation     Wrist pronation     Wrist supination     Grip strength (lbs)     (Blank rows = not tested)  SHOULDER SPECIAL TESTS: Impingement tests: Neer impingement test: positive  and Hawkins/Kennedy impingement test: positive  Rotator cuff assessment: Drop arm test: negative, Empty can test: negative, and Hornblower's sign: negative   LOWER EXTREMITY MMT:    MMT Right eval Left eval R/L 12/08/23    Hip flexion     Hip extension     Hip abduction     Hip adduction     Hip internal rotation     Hip external rotation     Knee flexion  4 5/5  Knee extension  4- 5/4*  Ankle dorsiflexion     Ankle plantarflexion     Ankle inversion     Ankle eversion      (Blank rows = not tested)   LOWER EXTREMITY ROM:     Active  Right eval Left eval L 12/08/23   Hip flexion     Hip extension     Hip abduction     Hip adduction     Hip internal rotation     Hip external rotation     Knee flexion 125 135 A: 130 deg *  Knee extension     Ankle dorsiflexion     Ankle plantarflexion     Ankle inversion     Ankle eversion      (Blank rows = not tested)   LOWER EXTREMITY SPECIAL TESTS:  Knee special tests: Lateral pull sign: negative and varus/valgus stress negative for laxity  PALPATION:  TTP R biceps tendon, AC joint   Functional testing:  30s chair stand test 0 reps   12/08/23:  - 30secSTS 4 reps (beginning  ascent for fifth rep at 30sec mark), moderate exertion and knee pain                                                                                                                           TREATMENT DATE:   [facility] Adult PT Treatment:                                                DATE: 12/20/2023  Neuromuscular re-ed: Seated heel/toe raises 2x15 into BOSU LAQ 2x15 2lb Ball squeeze + LAQ 2 x 5, 2lb - LEFT only  Manual therapy - long axis traction  TKEs BlueTB 2x15 each  Therapeutic Activity: Nustep L4 x 8 min  Manual Therapy:  Inferior PF mobilization, grade III with knee at 90 degrees to improved pain free ROM  [facility] Adult  PT Treatment:                                                DATE: 12/18/2023  Neuromuscular re-ed: Seated heel/toe raises 2x15 into BOSU LAQ 2x15 2lb Ball squeeze + LAQ 2 x 5, 2lb - LEFT only  Supine press/protraction 1kg ball 2x15 TKEs GTB 2x15 each  Therapeutic Activity: Nustep L4 x 9 min  [facility] Adult PT Treatment:                                                DATE: 12/18/2023  Neuromuscular re-ed: Seated heel/toe raises 2x15 into BOSU LAQ 2x15 2lb Ball squeeze + LAQ 2 x 5, 2lb - LEFT only  Supine press/protraction 1kg ball 2x15 TKEs GTB 2x15 each  Therapeutic Activity: Nustep L4 9 min Seated ham curls RTB 15x    PATIENT EDUCATION: Education details: rationale for interventions, HEP, PT goals/POC Person educated: Patient Education method: Explanation, Demonstration, Tactile cues, Verbal cues Education comprehension: verbalized understanding, returned demonstration, verbal cues required, tactile cues required, and needs further education     HOME EXERCISE PROGRAM: Access Code: [email or web address] URL: [email or web address] Date: 12/08/2023 Prepared by: Alm Jenny  Exercises - Standing Isometric Shoulder External Rotation with Doorway  - 2-3 x daily - 7 x weekly - 1 sets - 5 reps - Standing Isometric Shoulder Internal Rotation at  Doorway  - 2-3 x daily - 7 x weekly - 1 sets - 5 reps - Standing 'L' Stretch at Counter  - 2-3 x daily - 7 x weekly - 1 sets - 5 reps - Seated Heel Toe Raises  - 2-3 x daily - 7 x weekly - 1 sets - 10 reps - Seated Long Arc Quad  - 2-3 x daily - 7 x weekly - 1 sets - 10 reps  ASSESSMENT:   CLINICAL IMPRESSION:  12/20/2023: Angeni had good tolerance of today's treatment session, with focus on LE neuroreed program. Plan to focus on shoulder at next visit. She was able to tolerate additional resistance during today's visit with TKEs tolerated. She reported medial knee pain with LAQ that improved following manual intervention. We will continue to progress per POC as tolerated, in order to reach established rehab goals.    RECERT 12/08/2023: Pt arrives w/ report of 1-2/10 pain for shoulder/knee, improvement since fall but symptoms remain exacerbated; received MRI results which were reassuring, refer to [facility] for details. Looking at goals, pt demonstrates notable improvements in MMT/ROM and 30sec STS despite symptom exacerbation post fall, although deficits do persist and she has not yet fully met LTG. Continues to require rest breaks to maximize tolerance. HEP update today with emphasis on strategies to mitigate stiffness with prolonged sitting. Recommend extension of POC to address relevant deficits, minimize pain, and maximize functional tolerance. No adverse events, pt reports some UT soreness on departure but otherwise no change in symptoms. Pt departs today's session in no acute distress, all voiced questions/concerns addressed appropriately from PT perspective.       OBJECTIVE IMPAIRMENTS: Abnormal gait, decreased activity tolerance, decreased endurance, decreased knowledge of condition, decreased knowledge of use of DME, decreased mobility, difficulty walking, decreased ROM, decreased strength, impaired perceived functional ability, impaired UE functional use, postural  dysfunction, obesity, and pain.    ACTIVITY LIMITATIONS: carrying, lifting, sitting, standing, squatting, stairs, dressing, reach over head, and locomotion level  PERSONAL FACTORS: Age, Behavior pattern, Fitness, Past/current experiences, Time since onset of injury/illness/exacerbation, and 1 comorbidity: Hx of long Covid are also affecting patient's functional outcome.   REHAB POTENTIAL: Good  CLINICAL DECISION MAKING: Stable/uncomplicated  EVALUATION COMPLEXITY: Moderate   GOALS: Goals reviewed with patient? No  SHORT TERM GOALS: Target date: 11/14/2023    Patient to demonstrate independence in HEP  Baseline: TBD due to time constraints 11/16/23: reports good HEP adherence Goal status: MET  2.  Patient will increase AROM R shoulder to 160d flexion an abduction Baseline:  A/PROM Right eval Left eval  Shoulder flexion 90/160   Shoulder extension    Shoulder abduction 90/160    11/16/23: gross observation ~140 deg AAROM against gravity 12/08/23: 153 deg Goal status: PROGRESSING  3.  Increase L knee flexion to 130d Baseline: 125d 12/08/23: 130 deg painful Goal status: MET    LONG TERM GOALS: Target date: 01/19/2024  (Updated 12/08/23)    Patient will acknowledge 6/10 pain at least once during episode of care  in L knee and R shoulder Baseline: 10/10 12/08/23:  R shoulder: 2/10 currently, 1-10/10 in past week; L knee: 1/10 currently, 1-8/10 Goal status: MET  2.  Patient will score at least 45/80 on LEFS to signify clinically meaningful improvement in functional abilities.   Baseline: 16/80 12/08/23: 31/80 (meeting initial goal) Goal status: NEW/UPDATED 12/08/23  3.  Patient will increase 30s chair stand reps from 0 to 5 without arms to demonstrate and improved functional ability with less pain/difficulty as well as reduce fall risk.  Baseline:  12/08/23: 4 reps w/ exertion/pain Goal status: PROGRESSING  4.  Increase R shoulder strength to 4/5 Baseline:  MMT Right eval Left eval  Shoulder flexion  4-   Shoulder extension    Shoulder abduction 4-    12/08/23: see MMT chart above Goal status: MET  5.  Increase L knee strength to 4+/5 Baseline:  MMT Right eval Left eval  Hip flexion    Hip extension    Hip abduction    Hip adduction    Hip internal rotation    Hip external rotation    Knee flexion  4  Knee extension  4-   12/08/23: see MMT chart above Goal status: PROGRESSING    PLAN: (updated 12/08/23)  PT FREQUENCY: 1-2x/week  PT DURATION: 6 weeks  PLANNED INTERVENTIONS: 97110-Therapeutic exercises, 97530- Therapeutic activity, 97112- Neuromuscular re-education, 97535- Self Care, [id number]- Manual therapy, [phone]- Gait training, and Patient/Family education  PLAN FOR NEXT SESSION: HEP review and update, manual techniques as appropriate, aerobic tasks, ROM and flexibility activities, strengthening and PREs, TPDN, gait and balance training as needed     Marko Molt, PT, DPT  12/22/2023 4:11 PM

## 2023-12-21 ENCOUNTER — Other Ambulatory Visit: Payer: Self-pay

## 2023-12-21 NOTE — Patient Outreach (Signed)
 Complex Care Management   Visit Note  12/21/2023  Name:  Beth [name] MRN: [id number] DOB: 1975/12/15  Situation: Referral received for Complex Care Management related to HLD I obtained verbal consent from Patient.  Visit completed with patient  on the phone  Background:   Past Medical History:  Diagnosis Date   Asthma    Chronic pancreatitis (HCC)    DDD (degenerative disc disease), lumbosacral    DJD (degenerative joint disease)    Headache    Hyperlipidemia    Hypertension    Hypoglycemia    Obesity    Osteoarthritis    Prolactinoma (HCC)    Sciatica    Sleep apnea     Assessment: The patient reported experiencing a fall while cleaning the bleachers, which resulted in a loss of balance. During the incident, she reached for the bleacher for support, which ultimately led to an injury to her right shoulder and impact to her left knee and ankle. She is presently engaged in physical therapy to address the complications arising from this incident. Patient Reported Symptoms:  Cognitive Cognitive Status: Able to follow simple commands, Alert and oriented to person, place, and time, Normal speech and language skills      Neurological Neurological Review of Symptoms: No symptoms reported    HEENT HEENT Symptoms Reported: Change or loss of hearing HEENT Conditions: Ear problem(s) HEENT Comment: She has some heaaring loss.  she went to ENT and has some loss in the left ear but not require a hearing aid. Ear problem(s)  Cardiovascular Cardiovascular Symptoms Reported: No symptoms reported    Respiratory Respiratory Symptoms Reported: Shortness of breath Respiratory Conditions: Shortness of breath, Seasonal allergies  Endocrine Patient reports the following symptoms related to hypoglycemia or hyperglycemia : No symptoms reported Is patient diabetic?: No    Gastrointestinal Gastrointestinal Symptoms Reported: Constipation Gastrointestinal Conditions:  Constipation Gastrointestinal Management Strategies: Medication therapy, Diet modification, Fluid modification Gastrointestinal Comment: because of pancreatitis her food does not break down as it should    Genitourinary Genitourinary Symptoms Reported: Incontinence Genitourinary Conditions: Incontinence Genitourinary Management Strategies: Incontinence garment/pad, Activity Genitourinary Comment: pelvic floor muscles are week on the left side.  It has gotten better but will still continue to do the exercises  Integumentary   Skin Conditions: Itching Skin Management Strategies: Medication therapy  Musculoskeletal Musculoskelatal Symptoms Reviewed: Unsteady gait Musculoskeletal Conditions: Joint pain, Osteoarthritis, Back pain, Unsteady gait Musculoskeletal Management Strategies: Medical device, Medication therapy Falls in the past year?: Yes Number of falls in past year: 1 or less Was there an injury with Fall?: Yes Fall Risk Category Calculator: 2 Patient Fall Risk Level: Moderate Fall Risk Fall risk Follow up: Falls evaluation completed, Education provided, Falls prevention discussed  Psychosocial              12/21/2023   11:31 AM  Depression screen PHQ 2/9  Decreased Interest 2  Down, Depressed, Hopeless 1  PHQ - 2 Score 3  Altered sleeping 1  Tired, decreased energy 1  Change in appetite 3  Feeling bad or failure about yourself  1  Trouble concentrating 1  Moving slowly or fidgety/restless 0  Suicidal thoughts 0  PHQ-9 Score 10    There were no vitals filed for this visit.  Medications Reviewed Today     Reviewed by Weyman Corning, RN (Registered Nurse) on 12/21/23 at 1112  Med List Status: <None>   Medication Order Taking? Sig Documenting Provider Last Dose Status Informant  albuterol  (VENTOLIN  HFA) 108 (90 Base)  MCG/ACT inhaler [id number] Yes Inhale 2 puffs into the lungs every 6 (six) hours as needed for wheezing or shortness of breath. Byrum, Robert S, MD  Active    amoxicillin  (AMOXIL ) 875 MG tablet [id number]  Take 1 tablet (875 mg total) by mouth 2 (two) times daily.  Patient not taking: Reported on 11/16/2023   Arloa Suzen RAMAN, NP  Active   Ascorbic Acid  (VITAMIN C) 1000 MG tablet [id number] Yes Take 1,000 mg by mouth daily. [provider]  Active Self  benzonatate  (TESSALON ) 200 MG capsule [id number] Yes Take 1 capsule (200 mg total) by mouth 3 (three) times daily as needed for cough. Byrum, Robert S, MD  Active   Butenafine  HCl (MENTAX) 1 % cream [id number] Yes Apply 1 application topically 2 (two) times daily. Celestia Rosaline SQUIBB, NP  Active Self  Cholecalciferol (VITAMIN D3 PO) [id number] Yes Take 2,000 mg by mouth daily. On Hold [provider]  Active Self  Docusate Sodium  (COLACE PO) [id number]  Take 1 capsule by mouth daily. [provider]  Active Self  EPINEPHrine  0.3 mg/0.3 mL IJ SOAJ injection [id number]  Inject 0.3 mg into the muscle as needed for anaphylaxis. [provider]  Active Self           Med Note SOILA LYLE JAYSON Pablo Dec 26, 2021  9:08 AM) Pt needs a refill, current pen is expired  ergocalciferol  (VITAMIN D2) 1.25 MG (50000 UT) capsule [id number]  Take 1 capsule (50,000 Units total) by mouth once a week.  Patient not taking: Reported on 11/16/2023   Celestia Rosaline SQUIBB, NP  Active   fenofibrate  (TRICOR ) 145 MG tablet [id number] Yes Take 1 tablet (145 mg total) by mouth daily. Celestia Rosaline SQUIBB, NP  Active   fluticasone  (FLONASE ) 50 MCG/ACT nasal spray [id number] Yes Place 2 sprays into both nostrils daily. Shelah Lamar RAMAN, MD  Active Self  ibuprofen  (ADVIL ) 800 MG tablet [id number]  Take 1 tablet (800 mg total) by mouth 3 (three) times daily. Reddick, Johnathan B, NP  Active   ipratropium-albuterol  (DUONEB) 0.5-2.5 (3) MG/3ML SOLN [id number] Yes Take 3 mLs by nebulization every 6 (six) hours as needed (shortness of breath, wheezing, and persistent cough). Arloa Suzen RAMAN, NP  Active    levonorgestrel  (MIRENA ) 20 MCG/24HR IUD [id number] Yes 1 each by Intrauterine route once.  [provider]  Active Self  LORazepam  (ATIVAN ) 1 MG tablet [id number] Yes Take 1 tablet (1 mg total) by mouth every 8 (eight) hours as needed for anxiety (mri). Vaslow, Zachary K, MD  Active   Menthol, Topical Analgesic, (BIOFREEZE) 10 % CREA [id number] Yes Apply 1 Application topically daily as needed (pain). [provider]  Active Self  ondansetron  (ZOFRAN -ODT) 8 MG disintegrating tablet [id number] Yes Take 1 tablet (8 mg total) by mouth every 8 (eight) hours as needed for nausea or vomiting. Izell Domino, MD  Active   oseltamivir  (TAMIFLU ) 75 MG capsule [id number]  Take 1 capsule (75 mg total) by mouth 2 (two) times daily.  Patient not taking: Reported on 11/16/2023   Arloa Suzen RAMAN, NP  Active   Pancrelipase , Lip-Prot-Amyl, (CREON ) 24000-76000 units CPEP [id number] Yes Take 2 capsules by mouth See admin instructions. Take 2 capsules three times daily with food and 1 capsule twice daily with snacks. [provider]  Active Self  pantoprazole  (PROTONIX ) 20 MG tablet [id number] Yes Take 20 mg by mouth daily. [provider]  Active Self  promethazine -dextromethorphan  (PROMETHAZINE -DM) 6.25-15 MG/5ML syrup [id number] Yes  Take 5 mLs by mouth 3 (three) times daily as needed for cough. Arloa Suzen RAMAN, NP  Active   psyllium (REGULOID) 0.52 g capsule [id number] Yes Take 0.52 capsules by mouth as needed. [provider]  Active Self  rosuvastatin  (CRESTOR ) 40 MG tablet [id number] Yes Take 1 tablet (40 mg total) by mouth daily. Celestia Rosaline SQUIBB, NP  Active   Zinc  Sulfate (ZINC  15 PO) [id number] Yes Take 15 mg by mouth daily. [provider]  Active Self            Recommendation:   PCP Follow-up Specialty provider follow-up Continue with Physical therapy  Follow Up Plan:   Telephone follow up appointment date/time:  01/18/24  2 pm  Wilbert Diver RN,  BSN, [facility]   [facility], [facility]    Care Coordinator Phone: [phone]

## 2023-12-21 NOTE — Patient Instructions (Signed)
 Visit Information  Thank you for taking time to visit with me today. Please don't hesitate to contact me if I can be of assistance to you before our next scheduled appointment.  Your next care management appointment is by telephone on 01/18/24 at 2 pm   Please call the care guide team at [phone] if you need to cancel, schedule, or reschedule an appointment.   Please call [phone]-TALK (toll free, 24 hour hotline) go to [facility] [address], [place] [phone]) call 911 if you are experiencing a Mental Health or Behavioral Health Crisis or need someone to talk to.  Wilbert Diver RN, BSN, [facility], [facility]    Care Coordinator Phone: [phone]

## 2023-12-25 ENCOUNTER — Ambulatory Visit: Attending: Family Medicine

## 2023-12-25 DIAGNOSIS — M25511 Pain in right shoulder: Secondary | ICD-10-CM | POA: Diagnosis not present

## 2023-12-25 DIAGNOSIS — R2681 Unsteadiness on feet: Secondary | ICD-10-CM | POA: Insufficient documentation

## 2023-12-25 DIAGNOSIS — M25562 Pain in left knee: Secondary | ICD-10-CM | POA: Insufficient documentation

## 2023-12-25 DIAGNOSIS — G8929 Other chronic pain: Secondary | ICD-10-CM | POA: Insufficient documentation

## 2023-12-25 NOTE — Therapy (Signed)
 OUTPATIENT PHYSICAL THERAPY NOTE   Patient Name: Beth [name] MRN: [id number] DOB:[birth year], 48 y.o., female Today's Date: 12/25/2023  END OF SESSION:        Past Medical History:  Diagnosis Date   Asthma    Chronic pancreatitis (HCC)    DDD (degenerative disc disease), lumbosacral    DJD (degenerative joint disease)    Headache    Hyperlipidemia    Hypertension    Hypoglycemia    Obesity    Osteoarthritis    Prolactinoma (HCC)    Sciatica    Sleep apnea    Past Surgical History:  Procedure Laterality Date   CHOLECYSTECTOMY     COLONOSCOPY     CRANIOTOMY N/A 01/04/2022   Procedure: Endonasal endoscopic resection of pituitary tumor;  Surgeon: Cheryle Debby LABOR, MD;  Location: [facility] OR;  Service: Neurosurgery;  Laterality: N/A;  RM 20   NASAL SINUS SURGERY N/A 01/04/2022   Procedure: ENDOSCOPIC SINUS SURGERY TRANSNASAL PITUITARY REMOVAL WITH NASAL NASOSETTAL FLAP;  Surgeon: Llewellyn Gerard LABOR, DO;  Location: [facility] OR;  Service: ENT;  Laterality: N/A;   Patient Active Problem List   Diagnosis Date Noted   Class 3 severe obesity due to excess calories with body mass index (BMI) of 40.0 to 44.9 in adult 03/30/2023   Hypoglycemia 06/16/2022   Status post transsphenoidal pituitary resection (HCC) 01/04/2022   Low TSH level 10/21/2021   Dyslipidemia 07/15/2021   Osteoarthritis 08/20/2020   Sciatica 07/30/2020   DJD (degenerative joint disease)    Obstructive sleep apnea 03/11/2020   Snoring 02/05/2020   Pneumonia due to COVID-19 virus 12/15/2019   Acute respiratory failure with hypoxia (HCC) 12/15/2019   Hypokalemia 12/15/2019   Asthma without acute exacerbation 12/15/2019   Encounter for management of intrauterine contraceptive device (IUD) 06/09/2019   Essential hypertension 06/09/2019   Fatigue 06/09/2019   Slow transit constipation 06/09/2019   Hyperprolactinemia (HCC) 02/15/2018   Migraine 10/12/2017   Chronic pancreatitis (HCC) 11/09/2016   Prolactinoma  (HCC) 11/09/2016   BMI 38.0-38.9,adult 11/09/2016   Candida rash of groin 11/09/2016   Mild intermittent asthma 11/09/2016   Mixed hyperlipidemia 11/09/2016    PCP: Celestia Rosaline SQUIBB, NP   REFERRING PROVIDER: Teressa Rainell BROCKS, DO  REFERRING DIAG: [phone] (ICD-10-CM) - Rotator cuff tendinitis, right M17.12 (ICD-10-CM) - Primary osteoarthritis of left knee  THERAPY DIAG:  No diagnosis found.  Rationale for Evaluation and Treatment: Rehabilitation  ONSET DATE: Acute for shoulder, chronic for knee  SUBJECTIVE:  SUBJECTIVE STATEMENT:  12/25/2023: Patient reports that she had to get on the floor to look for something, and had pain with standing. She states heavy lifting, especially sideways.     Hand dominance: Right  PERTINENT HISTORY: Also with right shoulder shoulder pain for the last 3 weeks.  No injury or trauma. She does note when she gets out of bed she typically pushes up on the right side No radiation of pain No numbness or tingling Previous shoulder issues for which she did physical therapy following COVID hospitalization in 2021 She has started doing some shoulder circles and wall crawls Evaluated at urgent care 10/05/23 -X-rays were unremarkable of the shoulder - Was given a sling - Rx ibuprofen  800 mg 3 times daily as needed.  This has been somewhat helpful - Recommended to follow-up with sports medicine  Patient with left knee pain since 04/2023 and right shoulder pain x 3 weeks Left medial knee pain without any specific injury or trauma Pain along the medial knee Some occasional clicking and popping Seen in urgent care 10/05/2023 - Urgent care notes reviewed in detail during the visit today - Fitted with a knee brace - X-ray showed mild osteoarthritis Given Rx ibuprofen  800 mg 3 times  daily as needed which has been somewhat helpful  PAIN:  Are you having pain? Yes: NPRS scale: see subjective/10 Pain location: L knee Pain description: ache Aggravating factors: descending stairs, prolonged walking/sitting Relieving factors: heat, topical gels  PAIN:  Are you having pain? 12/08/23 - R shoulder: 2/10 currently, 1-10/10 in past week - L knee: 1/10 currently, 1-8/10  Prior to 12/08/23 visit: NPRS scale: 10/10 Pain location: R shoulder Pain description: ache, sharp Aggravating factors: OH motions, unexpected motion Relieving factors: massage, heat/ice   PRECAUTIONS: None  RED FLAGS: None   WEIGHT BEARING RESTRICTIONS: No  FALLS:  Has patient fallen in last 6 months? No  OCCUPATION: Office work  PLOF: Independent  PATIENT GOALS:To manage my shoulder and knee pain  NEXT MD VISIT: 6 weeks  OBJECTIVE:  Note: Objective measures were completed at Evaluation unless otherwise noted.  DIAGNOSTIC FINDINGS:  Right shoulder x-ray 10/05/2023 at urgent care personally reviewed and interpreted by me showing: - Mild A/C joint degenerative changes - No other bony abnormalities   Left knee x-ray 10/05/2023 4 views AP/lateral/obliques personally reviewed and interpreted by me today showing: - Mild degenerative changes along the medial and patellofemoral compartments - No other bony abnormalities  PATIENT SURVEYS:  LEFS 16/80  12/08/23 LEFS 31/80  POSTURE: Rounded shoulders  UPPER EXTREMITY ROM:   A/PROM Right eval Left eval R 12/08/23  Shoulder flexion 90/160  A: 153 deg  Shoulder extension     Shoulder abduction 90/160    Shoulder adduction     Shoulder internal rotation     Shoulder external rotation     Elbow flexion     Elbow extension     Wrist flexion     Wrist extension     Wrist ulnar deviation     Wrist radial deviation     Wrist pronation     Wrist supination     (Blank rows = not tested)  UPPER EXTREMITY MMT:  MMT Right eval  Left eval R/L 12/08/23    Shoulder flexion 4-  4+*/5  Shoulder extension     Shoulder abduction 4-  4+*/5  Shoulder adduction     Shoulder internal rotation     Shoulder external rotation     Middle trapezius  Lower trapezius     Elbow flexion     Elbow extension     Wrist flexion     Wrist extension     Wrist ulnar deviation     Wrist radial deviation     Wrist pronation     Wrist supination     Grip strength (lbs)     (Blank rows = not tested)  SHOULDER SPECIAL TESTS: Impingement tests: Neer impingement test: positive  and Hawkins/Kennedy impingement test: positive  Rotator cuff assessment: Drop arm test: negative, Empty can test: negative, and Hornblower's sign: negative   LOWER EXTREMITY MMT:    MMT Right eval Left eval R/L 12/08/23    Hip flexion     Hip extension     Hip abduction     Hip adduction     Hip internal rotation     Hip external rotation     Knee flexion  4 5/5  Knee extension  4- 5/4*  Ankle dorsiflexion     Ankle plantarflexion     Ankle inversion     Ankle eversion      (Blank rows = not tested)   LOWER EXTREMITY ROM:     Active  Right eval Left eval L 12/08/23   Hip flexion     Hip extension     Hip abduction     Hip adduction     Hip internal rotation     Hip external rotation     Knee flexion 125 135 A: 130 deg *  Knee extension     Ankle dorsiflexion     Ankle plantarflexion     Ankle inversion     Ankle eversion      (Blank rows = not tested)   LOWER EXTREMITY SPECIAL TESTS:  Knee special tests: Lateral pull sign: negative and varus/valgus stress negative for laxity  PALPATION:  TTP R biceps tendon, AC joint   Functional testing:  30s chair stand test 0 reps   12/08/23:  - 30secSTS 4 reps (beginning ascent for fifth rep at 30sec mark), moderate exertion and knee pain                                                                                                                           TREATMENT DATE:   [facility]  Adult PT Treatment:                                                DATE: 12/20/2023   PNF and isometric   Therapeutic Exercise:    Seated heel/toe raises 2x15 into BOSU LAQ 2x15 2lb Ball squeeze + LAQ 2 x 5, 2lb - LEFT only  Manual therapy - long axis traction  TKEs BlueTB 2x15 each  Therapeutic Activity: Nustep L4 x 8 min  Manual Therapy:  Inferior PF mobilization,  grade III with knee at 90 degrees to improved pain free ROM  [facility] Adult PT Treatment:                                                DATE: 12/18/2023  Neuromuscular re-ed: Seated heel/toe raises 2x15 into BOSU LAQ 2x15 2lb Ball squeeze + LAQ 2 x 5, 2lb - LEFT only  Supine press/protraction 1kg ball 2x15 TKEs GTB 2x15 each  Therapeutic Activity: Nustep L4 x 9 min  [facility] Adult PT Treatment:                                                DATE: 12/18/2023  Neuromuscular re-ed: Seated heel/toe raises 2x15 into BOSU LAQ 2x15 2lb Ball squeeze + LAQ 2 x 5, 2lb - LEFT only  Supine press/protraction 1kg ball 2x15 TKEs GTB 2x15 each  Therapeutic Activity: Nustep L4 9 min Seated ham curls RTB 15x    PATIENT EDUCATION: Education details: rationale for interventions, HEP, PT goals/POC Person educated: Patient Education method: Explanation, Demonstration, Tactile cues, Verbal cues Education comprehension: verbalized understanding, returned demonstration, verbal cues required, tactile cues required, and needs further education     HOME EXERCISE PROGRAM: Access Code: [email or web address] URL: [email or web address] Date: 12/25/2023 Prepared by: Marko Molt  Exercises - Standing Isometric Shoulder External Rotation with Doorway  - 2-3 x daily - 7 x weekly - 1 sets - 5 reps - Standing Isometric Shoulder Internal Rotation at Doorway  - 2-3 x daily - 7 x weekly - 1 sets - 5 reps - Standing 'L' Stretch at Counter  - 2-3 x daily - 7 x weekly - 1 sets - 5 reps - Seated Heel Toe Raises  - 2-3 x daily - 7 x weekly - 1  sets - 10 reps - Seated Long Arc Quad  - 2-3 x daily - 7 x weekly - 1 sets - 10 reps - Horizontal Shoulder Pendulum with Table Support  - 2 x daily - 7 x weekly - 30 sec hold - Circular Shoulder Pendulum with Table Support  - 2 x daily - 7 x weekly - 30 sec hold  ASSESSMENT:   CLINICAL IMPRESSION:  12/25/2023: Zianna had good tolerance of today's treatment session, with focus on LE neuroreed program. Plan to focus on shoulder at next visit. She was able to tolerate additional resistance during today's visit with TKEs tolerated. She reported medial knee pain with LAQ that improved following manual intervention. We will continue to progress per POC as tolerated, in order to reach established rehab goals.    RECERT 12/08/2023: Pt arrives w/ report of 1-2/10 pain for shoulder/knee, improvement since fall but symptoms remain exacerbated; received MRI results which were reassuring, refer to [facility] for details. Looking at goals, pt demonstrates notable improvements in MMT/ROM and 30sec STS despite symptom exacerbation post fall, although deficits do persist and she has not yet fully met LTG. Continues to require rest breaks to maximize tolerance. HEP update today with emphasis on strategies to mitigate stiffness with prolonged sitting. Recommend extension of POC to address relevant deficits, minimize pain, and maximize functional tolerance. No adverse events, pt reports some UT soreness on departure but otherwise no change in symptoms. Pt departs today's  session in no acute distress, all voiced questions/concerns addressed appropriately from PT perspective.       OBJECTIVE IMPAIRMENTS: Abnormal gait, decreased activity tolerance, decreased endurance, decreased knowledge of condition, decreased knowledge of use of DME, decreased mobility, difficulty walking, decreased ROM, decreased strength, impaired perceived functional ability, impaired UE functional use, postural dysfunction, obesity, and pain.   ACTIVITY  LIMITATIONS: carrying, lifting, sitting, standing, squatting, stairs, dressing, reach over head, and locomotion level  PERSONAL FACTORS: Age, Behavior pattern, Fitness, Past/current experiences, Time since onset of injury/illness/exacerbation, and 1 comorbidity: Hx of long Covid are also affecting patient's functional outcome.   REHAB POTENTIAL: Good  CLINICAL DECISION MAKING: Stable/uncomplicated  EVALUATION COMPLEXITY: Moderate   GOALS: Goals reviewed with patient? No  SHORT TERM GOALS: Target date: 11/14/2023    Patient to demonstrate independence in HEP  Baseline: TBD due to time constraints 11/16/23: reports good HEP adherence Goal status: MET  2.  Patient will increase AROM R shoulder to 160d flexion an abduction Baseline:  A/PROM Right eval Left eval  Shoulder flexion 90/160   Shoulder extension    Shoulder abduction 90/160    11/16/23: gross observation ~140 deg AAROM against gravity 12/08/23: 153 deg Goal status: PROGRESSING  3.  Increase L knee flexion to 130d Baseline: 125d 12/08/23: 130 deg painful Goal status: MET    LONG TERM GOALS: Target date: 01/19/2024  (Updated 12/08/23)    Patient will acknowledge 6/10 pain at least once during episode of care  in L knee and R shoulder Baseline: 10/10 12/08/23:  R shoulder: 2/10 currently, 1-10/10 in past week; L knee: 1/10 currently, 1-8/10 Goal status: MET  2.  Patient will score at least 45/80 on LEFS to signify clinically meaningful improvement in functional abilities.   Baseline: 16/80 12/08/23: 31/80 (meeting initial goal) Goal status: NEW/UPDATED 12/08/23  3.  Patient will increase 30s chair stand reps from 0 to 5 without arms to demonstrate and improved functional ability with less pain/difficulty as well as reduce fall risk.  Baseline:  12/08/23: 4 reps w/ exertion/pain Goal status: PROGRESSING  4.  Increase R shoulder strength to 4/5 Baseline:  MMT Right eval Left eval  Shoulder flexion 4-    Shoulder extension    Shoulder abduction 4-    12/08/23: see MMT chart above Goal status: MET  5.  Increase L knee strength to 4+/5 Baseline:  MMT Right eval Left eval  Hip flexion    Hip extension    Hip abduction    Hip adduction    Hip internal rotation    Hip external rotation    Knee flexion  4  Knee extension  4-   12/08/23: see MMT chart above Goal status: PROGRESSING    PLAN: (updated 12/08/23)  PT FREQUENCY: 1-2x/week  PT DURATION: 6 weeks  PLANNED INTERVENTIONS: 97110-Therapeutic exercises, 97530- Therapeutic activity, 97112- Neuromuscular re-education, 97535- Self Care, [id number]- Manual therapy, [phone]- Gait training, and Patient/Family education  PLAN FOR NEXT SESSION: HEP review and update, manual techniques as appropriate, aerobic tasks, ROM and flexibility activities, strengthening and PREs, TPDN, gait and balance training as needed     Marko Molt, PT, DPT  12/25/2023 5:06 PM

## 2023-12-27 ENCOUNTER — Ambulatory Visit: Attending: Family Medicine

## 2023-12-27 ENCOUNTER — Encounter: Payer: Self-pay | Admitting: Internal Medicine

## 2023-12-27 ENCOUNTER — Ambulatory Visit: Payer: BC Managed Care – PPO | Admitting: Internal Medicine

## 2023-12-27 VITALS — BP 120/80 | HR 71 | Ht 69.0 in | Wt 279.2 lb

## 2023-12-27 DIAGNOSIS — E221 Hyperprolactinemia: Secondary | ICD-10-CM

## 2023-12-27 DIAGNOSIS — R7989 Other specified abnormal findings of blood chemistry: Secondary | ICD-10-CM | POA: Diagnosis not present

## 2023-12-27 DIAGNOSIS — G8929 Other chronic pain: Secondary | ICD-10-CM

## 2023-12-27 DIAGNOSIS — R2681 Unsteadiness on feet: Secondary | ICD-10-CM

## 2023-12-27 DIAGNOSIS — D352 Benign neoplasm of pituitary gland: Secondary | ICD-10-CM

## 2023-12-27 DIAGNOSIS — E232 Diabetes insipidus: Secondary | ICD-10-CM

## 2023-12-27 DIAGNOSIS — M25511 Pain in right shoulder: Secondary | ICD-10-CM | POA: Diagnosis not present

## 2023-12-27 DIAGNOSIS — M25562 Pain in left knee: Secondary | ICD-10-CM | POA: Diagnosis not present

## 2023-12-27 NOTE — Patient Instructions (Signed)
Please come back for labs fasting, between 8-9 am.  You should have an endocrinology follow-up appointment in 1 year.

## 2023-12-27 NOTE — Therapy (Signed)
 OUTPATIENT PHYSICAL THERAPY NOTE   Patient Name: Beth [name] MRN: [id number] DOB:09-30-1975, 48 y.o., female Today's Date: 12/27/2023  END OF SESSION:   PT End of Session - 12/27/23 1621     Visit Number 12    Number of Visits 19    Date for PT Re-Evaluation 01/19/24    Authorization Type BCBS    PT Start Time 1620    PT Stop Time 1700    PT Time Calculation (min) 40 min    Activity Tolerance Patient tolerated treatment well    Behavior During Therapy [facility] for tasks assessed/performed          PT End of Session - 12/27/23 1621     Visit Number 12    Number of Visits 19    Date for PT Re-Evaluation 01/19/24    Authorization Type BCBS    PT Start Time 1620    PT Stop Time 1700    PT Time Calculation (min) 40 min    Activity Tolerance Patient tolerated treatment well    Behavior During Therapy [facility] for tasks assessed/performed           PT End of Session - 12/27/23 1621     Visit Number 12    Number of Visits 19    Date for PT Re-Evaluation 01/19/24    Authorization Type BCBS    PT Start Time 1620    PT Stop Time 1700    PT Time Calculation (min) 40 min    Activity Tolerance Patient tolerated treatment well    Behavior During Therapy WFL for tasks assessed/performed             Past Medical History:  Diagnosis Date   Asthma    Chronic pancreatitis (HCC)    DDD (degenerative disc disease), lumbosacral    DJD (degenerative joint disease)    Headache    Hyperlipidemia    Hypertension    Hypoglycemia    Obesity    Osteoarthritis    Prolactinoma (HCC)    Sciatica    Sleep apnea    Past Surgical History:  Procedure Laterality Date   CHOLECYSTECTOMY     COLONOSCOPY     CRANIOTOMY N/A 01/04/2022   Procedure: Endonasal endoscopic resection of pituitary tumor;  Surgeon: Cheryle Debby LABOR, MD;  Location: [facility] OR;  Service: Neurosurgery;  Laterality: N/A;  RM 20   NASAL SINUS SURGERY N/A 01/04/2022   Procedure: ENDOSCOPIC SINUS SURGERY TRANSNASAL  PITUITARY REMOVAL WITH NASAL NASOSETTAL FLAP;  Surgeon: Llewellyn Gerard LABOR, DO;  Location: [facility] OR;  Service: ENT;  Laterality: N/A;   Patient Active Problem List   Diagnosis Date Noted   Class 3 severe obesity due to excess calories with body mass index (BMI) of 40.0 to 44.9 in adult 03/30/2023   Hypoglycemia 06/16/2022   Status post transsphenoidal pituitary resection (HCC) 01/04/2022   Low TSH level 10/21/2021   Dyslipidemia 07/15/2021   Osteoarthritis 08/20/2020   Sciatica 07/30/2020   DJD (degenerative joint disease)    Obstructive sleep apnea 03/11/2020   Snoring 02/05/2020   Pneumonia due to COVID-19 virus 12/15/2019   Acute respiratory failure with hypoxia (HCC) 12/15/2019   Hypokalemia 12/15/2019   Asthma without acute exacerbation 12/15/2019   Encounter for management of intrauterine contraceptive device (IUD) 06/09/2019   Essential hypertension 06/09/2019   Fatigue 06/09/2019   Slow transit constipation 06/09/2019   Hyperprolactinemia (HCC) 02/15/2018   Migraine 10/12/2017   Chronic pancreatitis (HCC) 11/09/2016   Prolactinoma (HCC) 11/09/2016  BMI 38.0-38.9,adult 11/09/2016   Candida rash of groin 11/09/2016   Mild intermittent asthma 11/09/2016   Mixed hyperlipidemia 11/09/2016    PCP: Celestia Rosaline SQUIBB, NP   REFERRING PROVIDER: Teressa Rainell BROCKS, DO  REFERRING DIAG: [phone] (ICD-10-CM) - Rotator cuff tendinitis, right M17.12 (ICD-10-CM) - Primary osteoarthritis of left knee  THERAPY DIAG:  Chronic pain of left knee  Chronic right shoulder pain  Unsteadiness on feet  Rationale for Evaluation and Treatment: Rehabilitation  ONSET DATE: Acute for shoulder, chronic for knee  SUBJECTIVE:                                                                                                                                                                                      SUBJECTIVE STATEMENT: R shoulder improved, has regained full mobility.  L knee continues to  limit standing, stepping and squatting activities.    Hand dominance: Right  PERTINENT HISTORY: Also with right shoulder shoulder pain for the last 3 weeks.  No injury or trauma. She does note when she gets out of bed she typically pushes up on the right side No radiation of pain No numbness or tingling Previous shoulder issues for which she did physical therapy following COVID hospitalization in 2021 She has started doing some shoulder circles and wall crawls Evaluated at urgent care 10/05/23 -X-rays were unremarkable of the shoulder - Was given a sling - Rx ibuprofen  800 mg 3 times daily as needed.  This has been somewhat helpful - Recommended to follow-up with sports medicine  Patient with left knee pain since 04/2023 and right shoulder pain x 3 weeks Left medial knee pain without any specific injury or trauma Pain along the medial knee Some occasional clicking and popping Seen in urgent care 10/05/2023 - Urgent care notes reviewed in detail during the visit today - Fitted with a knee brace - X-ray showed mild osteoarthritis Given Rx ibuprofen  800 mg 3 times daily as needed which has been somewhat helpful  PAIN:  Are you having pain? Yes: NPRS scale: see subjective/10 Pain location: L knee Pain description: ache Aggravating factors: descending stairs, prolonged walking/sitting Relieving factors: heat, topical gels  PAIN:  Are you having pain? 12/08/23 - R shoulder: 2/10 currently, 1-10/10 in past week - L knee: 1/10 currently, 1-8/10  Prior to 12/08/23 visit: NPRS scale: 10/10 Pain location: R shoulder Pain description: ache, sharp Aggravating factors: OH motions, unexpected motion Relieving factors: massage, heat/ice   PRECAUTIONS: None  RED FLAGS: None   WEIGHT BEARING RESTRICTIONS: No  FALLS:  Has patient fallen in last 6 months? No  OCCUPATION: Office work  PLOF: Independent  PATIENT GOALS:To manage my shoulder  and knee pain  NEXT MD VISIT: 6  weeks  OBJECTIVE:  Note: Objective measures were completed at Evaluation unless otherwise noted.  DIAGNOSTIC FINDINGS:  Right shoulder x-ray 10/05/2023 at urgent care personally reviewed and interpreted by me showing: - Mild A/C joint degenerative changes - No other bony abnormalities   Left knee x-ray 10/05/2023 4 views AP/lateral/obliques personally reviewed and interpreted by me today showing: - Mild degenerative changes along the medial and patellofemoral compartments - No other bony abnormalities  PATIENT SURVEYS:  LEFS 16/80  12/08/23 LEFS 31/80  POSTURE: Rounded shoulders  UPPER EXTREMITY ROM:   A/PROM Right eval Left eval R 12/08/23  Shoulder flexion 90/160  A: 153 deg  Shoulder extension     Shoulder abduction 90/160    Shoulder adduction     Shoulder internal rotation     Shoulder external rotation     Elbow flexion     Elbow extension     Wrist flexion     Wrist extension     Wrist ulnar deviation     Wrist radial deviation     Wrist pronation     Wrist supination     (Blank rows = not tested)  UPPER EXTREMITY MMT:  MMT Right eval Left eval R/L 12/08/23    Shoulder flexion 4-  4+*/5  Shoulder extension     Shoulder abduction 4-  4+*/5  Shoulder adduction     Shoulder internal rotation     Shoulder external rotation     Middle trapezius     Lower trapezius     Elbow flexion     Elbow extension     Wrist flexion     Wrist extension     Wrist ulnar deviation     Wrist radial deviation     Wrist pronation     Wrist supination     Grip strength (lbs)     (Blank rows = not tested)  SHOULDER SPECIAL TESTS: Impingement tests: Neer impingement test: positive  and Hawkins/Kennedy impingement test: positive  Rotator cuff assessment: Drop arm test: negative, Empty can test: negative, and Hornblower's sign: negative   LOWER EXTREMITY MMT:    MMT Right eval Left eval R/L 12/08/23    Hip flexion     Hip extension     Hip abduction     Hip  adduction     Hip internal rotation     Hip external rotation     Knee flexion  4 5/5  Knee extension  4- 5/4*  Ankle dorsiflexion     Ankle plantarflexion     Ankle inversion     Ankle eversion      (Blank rows = not tested)   LOWER EXTREMITY ROM:     Active  Right eval Left eval L 12/08/23   Hip flexion     Hip extension     Hip abduction     Hip adduction     Hip internal rotation     Hip external rotation     Knee flexion 125 135 A: 130 deg *  Knee extension     Ankle dorsiflexion     Ankle plantarflexion     Ankle inversion     Ankle eversion      (Blank rows = not tested)   LOWER EXTREMITY SPECIAL TESTS:  Knee special tests: Lateral pull sign: negative and varus/valgus stress negative for laxity  PALPATION:  TTP R biceps tendon, AC joint   Functional testing:  30s chair stand test 0 reps   12/08/23:  - 30secSTS 4 reps (beginning ascent for fifth rep at 30sec mark), moderate exertion and knee pain                                                                                                                           TREATMENT DATE:  [facility] Adult PT Treatment:                                                DATE: 12/27/23 Therapeutic Exercise: Nustep L4 6 min Neuromuscular re-ed: Supine press/protraction 1000 ball 15x Supine OH flexion 1000g ball 15x Rhythmic stab at 90d flexion 30s x3 Therapeutic Activity: Supine hor abd YTB 15x B, 15/15 unilaterally SLR L 15x SAQs 3# 15x emphasis on eccentric motion TKEs BluTB 15x 3 way pull   [facility] Adult PT Treatment:                                                DATE: 12/26/2023  Neuromuscular Re-ed:  Supine dowel AAROM flexion, 2 x 10  Supine dowel AAROM Abduction, poor tolerance today x 8  Supine shoulder flexion with resisted abduction 2 x 10  Supine shoulder flexion/abduction 2 x 8 PNF/Reversal of Antagonist  Therapeutic Activity:  UBE level 2 x 2.5 minute forward/2.5 minute backward Extra time discussing  important patient goals for remaining POC   Resistance today provided by manual resistance from clinician unless otherwise noted   [facility] Adult PT Treatment:                                                DATE: 12/20/2023   Neuromuscular re-ed: Seated heel/toe raises 2x15 into BOSU LAQ 2x15 2lb Ball squeeze + LAQ 2 x 5, 2lb - LEFT only  Manual therapy - long axis traction  TKEs BlueTB 2x15 each   Therapeutic Activity: Nustep L4 x 8 min   Manual Therapy:  Inferior PF mobilization, grade III with knee at 90 degrees to improved pain free ROM  [facility] Adult PT Treatment:                                                DATE: 12/18/2023  Neuromuscular re-ed: Seated heel/toe raises 2x15 into BOSU LAQ 2x15 2lb Ball squeeze + LAQ 2 x 5, 2lb - LEFT only  Supine press/protraction 1kg ball 2x15 TKEs GTB 2x15 each  Therapeutic Activity: Nustep  L4 x 9 min  [facility] Adult PT Treatment:                                                DATE: 12/18/2023  Neuromuscular re-ed: Seated heel/toe raises 2x15 into BOSU LAQ 2x15 2lb Ball squeeze + LAQ 2 x 5, 2lb - LEFT only  Supine press/protraction 1kg ball 2x15 TKEs GTB 2x15 each  Therapeutic Activity: Nustep L4 9 min Seated ham curls RTB 15x    PATIENT EDUCATION: Education details: rationale for interventions, HEP, PT goals/POC Person educated: Patient Education method: Explanation, Demonstration, Tactile cues, Verbal cues Education comprehension: verbalized understanding, returned demonstration, verbal cues required, tactile cues required, and needs further education     HOME EXERCISE PROGRAM: Access Code: [email or web address] URL: [email or web address] Date: 12/25/2023 Prepared by: Marko Molt  Exercises - Standing Isometric Shoulder External Rotation with Doorway  - 2-3 x daily - 7 x weekly - 1 sets - 5 reps - Standing Isometric Shoulder Internal Rotation at Doorway  - 2-3 x daily - 7 x weekly - 1 sets - 5 reps - Standing 'L' Stretch  at Counter  - 2-3 x daily - 7 x weekly - 1 sets - 5 reps - Seated Heel Toe Raises  - 2-3 x daily - 7 x weekly - 1 sets - 10 reps - Seated Long Arc Quad  - 2-3 x daily - 7 x weekly - 1 sets - 10 reps - Horizontal Shoulder Pendulum with Table Support  - 2 x daily - 7 x weekly - 30 sec hold - Circular Shoulder Pendulum with Table Support  - 2 x daily - 7 x weekly - 30 sec hold  ASSESSMENT:   CLINICAL IMPRESSION:  Focus of today's session was L TKE strengthening and posterior shoulder strengthening.  Progressed to more strengthening tasks and challenged TKE by adding alternative directions of pull.  Attempt prone exercises for shoulder.   RECERT 12/08/2023: Pt arrives w/ report of 1-2/10 pain for shoulder/knee, improvement since fall but symptoms remain exacerbated; received MRI results which were reassuring, refer to [facility] for details. Looking at goals, pt demonstrates notable improvements in MMT/ROM and 30sec STS despite symptom exacerbation post fall, although deficits do persist and she has not yet fully met LTG. Continues to require rest breaks to maximize tolerance. HEP update today with emphasis on strategies to mitigate stiffness with prolonged sitting. Recommend extension of POC to address relevant deficits, minimize pain, and maximize functional tolerance. No adverse events, pt reports some UT soreness on departure but otherwise no change in symptoms. Pt departs today's session in no acute distress, all voiced questions/concerns addressed appropriately from PT perspective.       OBJECTIVE IMPAIRMENTS: Abnormal gait, decreased activity tolerance, decreased endurance, decreased knowledge of condition, decreased knowledge of use of DME, decreased mobility, difficulty walking, decreased ROM, decreased strength, impaired perceived functional ability, impaired UE functional use, postural dysfunction, obesity, and pain.   ACTIVITY LIMITATIONS: carrying, lifting, sitting, standing, squatting, stairs,  dressing, reach over head, and locomotion level  PERSONAL FACTORS: Age, Behavior pattern, Fitness, Past/current experiences, Time since onset of injury/illness/exacerbation, and 1 comorbidity: Hx of long Covid are also affecting patient's functional outcome.   REHAB POTENTIAL: Good  CLINICAL DECISION MAKING: Stable/uncomplicated  EVALUATION COMPLEXITY: Moderate   GOALS: Goals reviewed with patient? No  SHORT TERM GOALS: Target date: 11/14/2023  Patient to demonstrate independence in HEP  Baseline: TBD due to time constraints 11/16/23: reports good HEP adherence Goal status: MET  2.  Patient will increase AROM R shoulder to 160d flexion an abduction Baseline:  A/PROM Right eval Left eval  Shoulder flexion 90/160   Shoulder extension    Shoulder abduction 90/160    11/16/23: gross observation ~140 deg AAROM against gravity 12/08/23: 153 deg Goal status: PROGRESSING  3.  Increase L knee flexion to 130d Baseline: 125d 12/08/23: 130 deg painful Goal status: MET    LONG TERM GOALS: Target date: 01/19/2024  (Updated 12/08/23)    Patient will acknowledge 6/10 pain at least once during episode of care  in L knee and R shoulder Baseline: 10/10 12/08/23:  R shoulder: 2/10 currently, 1-10/10 in past week; L knee: 1/10 currently, 1-8/10 Goal status: MET  2.  Patient will score at least 45/80 on LEFS to signify clinically meaningful improvement in functional abilities.   Baseline: 16/80 12/08/23: 31/80 (meeting initial goal) Goal status: NEW/UPDATED 12/08/23  3.  Patient will increase 30s chair stand reps from 0 to 5 without arms to demonstrate and improved functional ability with less pain/difficulty as well as reduce fall risk.  Baseline:  12/08/23: 4 reps w/ exertion/pain Goal status: PROGRESSING  4.  Increase R shoulder strength to 4/5 Baseline:  MMT Right eval Left eval  Shoulder flexion 4-   Shoulder extension    Shoulder abduction 4-    12/08/23: see MMT chart  above Goal status: MET  5.  Increase L knee strength to 4+/5 Baseline:  MMT Right eval Left eval  Hip flexion    Hip extension    Hip abduction    Hip adduction    Hip internal rotation    Hip external rotation    Knee flexion  4  Knee extension  4-   12/08/23: see MMT chart above Goal status: PROGRESSING    PLAN: (updated 12/08/23)  PT FREQUENCY: 1-2x/week  PT DURATION: 6 weeks  PLANNED INTERVENTIONS: 97110-Therapeutic exercises, 97530- Therapeutic activity, 97112- Neuromuscular re-education, 97535- Self Care, [id number]- Manual therapy, [phone]- Gait training, and Patient/Family education  PLAN FOR NEXT SESSION: HEP review and update, manual techniques as appropriate, aerobic tasks, ROM and flexibility activities, strengthening and PREs, TPDN, gait and balance training as needed     12/27/2023

## 2023-12-27 NOTE — Progress Notes (Addendum)
 Patient ID: Beth [name], female   DOB: 05-02-1976, 48 y.o.   MRN: [id number]  HPI  Beth [name] is a 48 y.o.-year-old female, presenting for follow-up for pituitary macroadenoma, now s/p transsphenoidal surgery.  She previously saw Dr. Kassie, but last visit with me, 4 months ago.  Interim history: She still has headaches, but not as frequent.  No significant visual deficit.  No galactorrhea. She has some urinary incontinence, which is chronic.  She sees urology. Previously had fatigue which improved after starting back on her CPAP.  Before last visit, she had stereotactic radiation therapy to her pituitary tumor.  She developed facial swelling, coughing, nausea and vomiting during RxTx but was able to complete the 5 treatments. She has some memory pbs after the RxTx. She has R shoulder and L knee pain >> in PT. She fell while in PT. Shoulder pain is better.  Reviewed and addended history:  Pt. was found to have a high prolactin level in 2007 when she presented with galactorrhea. She was started on Metformin. 1 Month later, she got pregnant.  She stopped Metformin when pregnancy was detected.  In 2013, she saw another ObGyn for current galactorrhea.  Pregnancy was ruled out. PRL was high. She was started on Bromocriptine .  She moved to [place] in 2015. She continued Bromocriptine .  She was referred to Dr. Kassie in 01/2018.  She was still on bromocriptine  when she started to see him and she was maintained on a low-dose by Dr. Kassie.  In 06/2021, PRL again returned high >> Bromocriptine  was increased from 2.5 to 5 mg daily but she could not tolerate it >> back to 2.5 mg.  She then saw ophthalmology >> Bilateral VF cut >> rec'd an MRI >> sent for a pituitary MRI (10/2021).   This showed a very large pituitary tumor, consistent with a macroadenoma with significant mass effect on the optic chiasm: Pituitary MRI (11/12/2021): Brain: Very large heterogeneously enhancing sellar/suprasellar  mass, which involves the pituitary gland and measures up to 4.1 x 3.4 x 4.1 cm (AP by transverse by craniocaudal). Significant suprasellar extension with significant mass effect on the optic chiasm. Tumor extends into the sphenoid sinuses bilaterally. Definite right and probable left cavernous sinus invasion with apparent encasement of the right greater than left internal carotid arteries.   No evidence of acute infarct, acute hemorrhage, hydrocephalus, midline shift, or extra-axial fluid collection. No pathologic enhancement outside of the pituitary/sella.   Vascular: Major arterial flow voids are maintained at the skull base.   Skull and upper cervical spine: Normal marrow signal.   Sinuses/Orbits: Tumor within the sphenoid sinuses, described above.  Otherwise, sinuses are largely clear.   Other: No mastoid effusions.ac   IMPRESSION: Very large 4.1 x 3.4 by 4.1 cm sellar/suprasellar mass with significant mass effect on the optic chiasm, cavernous sinus invasion, and extension into the sphenoid sinus, probably a pituitary macroadenoma. Recommend neurosurgical consultation.      The MRI results return to me after Dr. Kassie left the practice.  We checked her pituitary hormones and these were normal.    I referred her to neurosurgery and she had transsphenoidal surgery 01/04/2022 by Dr. Rockney >> debulking, with residual pituitary tumor in the cavernous sinus  Pathology showed: Pituitary tumor pathology was reviewed but this was reported only as neuroendocrine tumor, but we asked for further staining, which actually showed a gonadotropin  origin: A. PITUITARY TUMOR, RESECTION: Pituitary neuroendocrine tumor (adenoma of anterior pituitary gland), fragmented. There is no tumor necrosis. Adjacent  strips of normal bone and nasal mucosa. There are no cytologic features diagnostic of malignancy.  GROSS DESCRIPTION: Received fresh are portions of soft tan-pink to hemorrhagic  tissue measuring 2.5 x 2.5 x 0.8 cm in aggregate.  There is a 1 cm portion of bone present.  The specimen is entirely submitted in 4 cassettes including decalcified bone.  (GRP 01/04/2022)  The case was sent to [facility] for performing additional immunostains. The immunostains show that the tumor cells are positive for Steroidogenic factor 1 (SF-1) and are negative for ACTH , prolactin, FSH, LH, GH and TSH.  Pituitary MRI obtained after surgery (02/20/2022): Showed large amount of persistent tissue in the right cavernous sinus:  Resolved suprasellar mass effect including on the optic chiasm (series 3, image 14). Intraorbital soft tissues remain stable and within normal limits. Postoperative changes to the posterior ethmoid and sphenoid sinuses, see additional details below. Mastoids remain clear. Visible internal auditory structures appear normal.   Other: Interval intra-sellar and suprasellar resection of tumor. Resolve suprasellar mass effect. Hypothalamus is within normal limits. Infundibulum is now visible, with some downward traction (series 12, image 8). Infundibulum is also deviated into the left sella turcica (series 14, image 8). Subjacent fairly homogeneously enhancing soft tissue there is likely normal pituitary. But this is inseparable from heterogeneously enhancing residual tumor which tracks posteriorly and laterally from the sella turcica into and across the right cavernous sinus as before. Confluent residual tumor is estimated at 26 (series 12, image 4) x 30 x 17 mm (images 6 and 7 of series 14), AP by transverse by CC. Right IAC siphon is engulfed as left-side/intra sellar tumor margin with the residual pituitary is difficult to discern on some images. But the left cavernous sinus appears to remain spared.   Superimposed abnormal signal and enhancement in the postoperative sphenoid sinuses. In the midline and to the right of midline most of this is intrinsically T1  hyperintense (series 9, image 10) compatible with postoperative fat and/or inspissated secretions. Rim enhancing, but centrally T1 and T2 hypointense abnormality throughout the central and left sphenoid sinus appears fairly distinct from the residual tumor in the cavernous sinus and is probably paranasal sinus mucosal disease and/or granulation tissue with inspissated secretions. ...   IMPRESSION: 1. Interval debulking of intra-sellar and suprasellar pituitary adenoma. Resolved suprasellar mass effect. Residual tumor with epicenter at the right cavernous sinus is approximately 2.6 x 3.0 x 1.7 cm. See series 14, images 6 and 7. Left cavernous sinus seems spared. Abnormal mixed signal and enhancing material throughout both sphenoid sinuses is favored to be postoperative and/or sinus mucosal disease related. Attention is directed on follow-up.    After surgery, her TSH was low, but she was not started on levothyroxine pending further investigation. Also, she developed increased thirst and urination was started on DDAVP  0.1 mg twice a day. The rest of the hormones were not checked at that time. After the surgery, we got in contact and I advised her to stop bromocriptine .  I saw the patient for the first time in 02/2022 and repeated her pituitary hormones and they were again normal.  Pituitary MRI (05/25/2022): Brain: Postsurgical changes reflecting prior trans-sphenoidal pituitary mass resection are again seen. Residual adenoma centered in the right cavernous sinus measures approximately 2.2 cm AP x 2.7 cm TV in the axial plane (19-52) and 2.9 cm TV x 1.4 cm cc in the coronal plane (18-9). The size of residual tumor is not significantly changed compared to the prior study allowing  for slight differences in measurement technique and slice selection. There is unchanged cavernous sinus invasion and encasement of the cavernous ICA. There is no extent into the suprasellar cistern or mass  effect on the chiasm. There is unchanged leftward deviation of the pituitary stalk.   Previously seen enhancement in the adjacent sphenoid sinuses has decreased, favored to reflect evolving postoperative change as opposed to residual tumor. ...   IMPRESSION: 1. Since 02/18/2022, no significant interval change in size or appearance of the residual pituitary adenoma centered in the right cavernous sinus. 2. Decreased mucosal thickening and enhancement in the sphenoid sinuses, consistent with evolving postoperative change.   In 05/2022, she had stereotactic radiation therapy - 5 sessions - every other day. Last session 06/14/2022.   Pituitary MRI (12/14/2022): Brain: Patient is status post transsphenoidal resection of a pituitary macro adenoma. The pituitary stalk is asymmetric to the left, stable. Residual right-sided tumor extending into the right cavernous sinus continues to decrease in size, now measuring 20 x 11 mm on coronal images. This compares with 21 x 14 mm on the prior study. Cavernous sinus invasion is stable to decreased since prior exam. No new or progressive enhancement is present.  VF 09/01/2022 (Dr. Glendia) - stable.   Pituitary MRI (06/07/2023): Brain: Negative for an acute infarct. No hemorrhage. No hydrocephalus. No extra-axial fluid collection.   Postsurgical changes from transsphenoidal resection of pituitary tumor. Compared to prior exam there is interval decrease in size of the residual tumor along the right aspect of the sella. Residual tumor now measures up to 9 x 8 mm, previously 11 x 9 mm (when measured in a similar orientation) with slightly decreased invasion of the right cavernous sinus. No new contrast-enhancing lesions visualized.   Vascular: Normal flow voids.   Skull and upper cervical spine: Normal marrow signal.   Sinuses/Orbits: No middle ear or mastoid effusion. Paranasal sinuses are notable for postsurgical changes from transsphenoidal  resection of pituitary tumor. Orbits are unremarkable.   Other: None.   IMPRESSION: Postsurgical changes from transsphenoidal resection of pituitary tumor. Interval decrease in size of the residual tumor along the right aspect of the sella with slightly decreased invasion of the right cavernous sinus.  VF 11/02/2023 (Dr. Glendia) - stable, reportedly  Sees oncology 06/17/2024 (Dr. Rockney left) -  Dr. Buckley.  Alse see DR. Skotniki, ENT.  I reviewed pt's pertinent tests: Lab Results  Component Value Date   PROLACTIN 13.6 12/29/2022   PROLACTIN 12.6 07/10/2022   PROLACTIN 15.1 03/14/2022   PROLACTIN 1.8 (L) 10/21/2021   PROLACTIN 40.0 (H) 07/15/2021   PROLACTIN 32.1 (H) 06/11/2020   PROLACTIN 5.4 05/15/2019   PROLACTIN 32.9 (H) 04/03/2019   PROLACTIN 5.5 03/22/2018   PROLACTIN 34.9 (H) 02/15/2018   Component Ref Range & Units 3 mo ago  PROLACTIN,UNDILUTED 2.0 - 30.0 ng/mL 2.8   PROLACTIN,DILUTED ng/mL   Comment: Result confirmed by 1:100 dilution. No high dose  hook effect detected.              Reference Range   Females          Non-pregnant        3.0-30.0          Pregnant           10.0-209.0          Postmenopausal      2.0-20.0     Lab Results  Component Value Date   TSH 1.29 04/20/2023   TSH 0.64 12/29/2022  TSH 0.89 07/10/2022   TSH 0.287 (L) 06/16/2022   TSH 0.71 03/14/2022   TSH 0.026 (L) 01/09/2022   TSH 0.73 11/15/2021   TSH 0.71 10/21/2021   TSH 1.030 06/13/2021   TSH 0.84 06/11/2020   FREET4 0.58 (L) 04/20/2023   FREET4 0.75 12/29/2022   FREET4 0.72 07/10/2022   FREET4 0.91 06/16/2022   FREET4 0.65 03/14/2022   FREET4 1.00 01/09/2022   FREET4 0.70 11/15/2021   FREET4 1.0 10/21/2021   FREET4 0.99 06/13/2021   FREET4 0.72 06/11/2020    Lab Results  Component Value Date   T3FREE 3.1 04/20/2023   T3FREE 3.3 12/29/2022   T3FREE 3.5 07/10/2022   T3FREE 2.4 06/16/2022   T3FREE 2.8 03/14/2022   T3FREE 2.6 11/15/2021   Other pertinent  test results: Component     Latest Ref Rng 12/29/2022  IGF-I, LC/MS     52 - 328 ng/mL 106   Z-Score (Female)     -2.0 - 2.0 SD -0.6   Prolactin     ng/mL 13.6   Cortisol, Plasma     ug/dL 4.4   Triiodothyronine,Free,Serum     2.3 - 4.2 pg/mL 3.3   FSH     mIU/ML 6.0   LH     mIU/mL 2.94   C206 ACTH      6 - 50 pg/mL 21   Growth Hormone     < OR = 7.1 ng/mL 0.1    Component     Latest Ref Rng 07/10/2022  IGF-I, LC/MS     52 - 328 ng/mL 129   Z-Score (Female)     -2.0 - 2.0 SD -0.2   Prolactin     ng/mL 12.6   Cortisol, Plasma     ug/dL 9.1   Triiodothyronine,Free,Serum     2.3 - 4.2 pg/mL 3.5   C206 ACTH      6 - 50 pg/mL 31   Growth Hormone     < OR = 7.1 ng/mL 0.1    Component     Latest Ref Rng 03/14/2022  IGF-I, LC/MS     52 - 328 ng/mL 115   Z-Score (Female)     -2.0 - 2.0 SD -0.5   Sodium     135 - 145 mEq/L 141   Cortisol, Plasma     ug/dL 7.8   FSH     mIU/ML 6.9   LH     mIU/mL 3.51   C206 ACTH      6 - 50 pg/mL 23    Component     Latest Ref Rng 11/15/2021 01/09/2022  IGF-I, LC/MS     52 - 328 ng/mL 124    Z-Score (Female)     -2.0 - 2.0 SD -0.3    FSH     mIU/ML 5.8    LH     mIU/mL 2.28    Cortisol, Plasma     ug/dL 6.0  81.2   R793 ACTH      6 - 50 pg/mL 22     At our visit from 02/2022, I advised her to try to stop the DDAVP .  She was able to do so.  Sodium levels are normal: Lab Results  Component Value Date   NA 141 10/11/2023   NA 142 06/29/2023   NA 144 06/16/2022   NA 141 03/14/2022   NA 140 01/13/2022   NA 140 01/12/2022   NA 141 01/11/2022   NA 141 01/11/2022   NA 141 01/10/2022  NA 141 01/10/2022   She has a paternal aunt with a Prolactinoma  >> resected.  Pt. also has a history of chronic pancreatitis  - chronic EPI, DDD, HTN, HL, obesity, sciatica. She is on Mirena  IUD.  ROS: + see HPI  Past Medical History:  Diagnosis Date   Asthma    Chronic pancreatitis (HCC)    DDD (degenerative disc disease),  lumbosacral    DJD (degenerative joint disease)    Headache    Hyperlipidemia    Hypertension    Hypoglycemia    Obesity    Osteoarthritis    Prolactinoma (HCC)    Sciatica    Sleep apnea    Past Surgical History:  Procedure Laterality Date   CHOLECYSTECTOMY     COLONOSCOPY     CRANIOTOMY N/A 01/04/2022   Procedure: Endonasal endoscopic resection of pituitary tumor;  Surgeon: Cheryle Debby LABOR, MD;  Location: [facility] OR;  Service: Neurosurgery;  Laterality: N/A;  RM 20   NASAL SINUS SURGERY N/A 01/04/2022   Procedure: ENDOSCOPIC SINUS SURGERY TRANSNASAL PITUITARY REMOVAL WITH NASAL NASOSETTAL FLAP;  Surgeon: Llewellyn Gerard LABOR, DO;  Location: [facility] OR;  Service: ENT;  Laterality: N/A;   Social History   Socioeconomic History   Marital status: Divorced    Spouse name: Not on file   Number of children: 3   Years of education: Not on file   Highest education level: Some college, no degree  Occupational History   Not on file  Tobacco Use   Smoking status: Never    Passive exposure: Never   Smokeless tobacco: Never  Vaping Use   Vaping status: Never Used  Substance and Sexual Activity   Alcohol use: Never   Drug use: Never   Sexual activity: Yes    Birth control/protection: I.U.D.  Other Topics Concern   Not on file  Social History Narrative   Not on file   Social Drivers of Health   Financial Resource Strain: High Risk (06/29/2023)   Overall Financial Resource Strain (CARDIA)    Difficulty of Paying Living Expenses: Hard  Food Insecurity: Food Insecurity Present (12/21/2023)   Hunger Vital Sign    Worried About Running Out of Food in the Last Year: Sometimes true    Ran Out of Food in the Last Year: Sometimes true  Transportation Needs: No Transportation Needs (12/21/2023)   PRAPARE - Administrator, Civil Service (Medical): No    Lack of Transportation (Non-Medical): No  Physical Activity: Unknown (06/29/2023)   Exercise Vital Sign    Days of Exercise per  Week: 0 days    Minutes of Exercise per Session: Not on file  Stress: Stress Concern Present (06/29/2023)   [employer] of Occupational Health - Occupational Stress Questionnaire    Feeling of Stress : To some extent  Social Connections: Unknown (06/29/2023)   Social Connection and Isolation Panel    Frequency of Communication with Friends and Family: More than three times a week    Frequency of Social Gatherings with Friends and Family: Patient declined    Attends Religious Services: Patient declined    Database administrator or Organizations: Yes    Attends Engineer, structural: More than 4 times per year    Marital Status: Divorced  Intimate Partner Violence: Not At Risk (12/21/2023)   Humiliation, Afraid, Rape, and Kick questionnaire    Fear of Current or Ex-Partner: No    Emotionally Abused: No    Physically Abused: No  Sexually Abused: No   Current Outpatient Medications on File Prior to Visit  Medication Sig Dispense Refill   albuterol  (VENTOLIN  HFA) 108 (90 Base) MCG/ACT inhaler Inhale 2 puffs into the lungs every 6 (six) hours as needed for wheezing or shortness of breath. 8 g 6   amoxicillin  (AMOXIL ) 875 MG tablet Take 1 tablet (875 mg total) by mouth 2 (two) times daily. (Patient not taking: Reported on 11/16/2023) 20 tablet 0   Ascorbic Acid  (VITAMIN C) 1000 MG tablet Take 1,000 mg by mouth daily.     benzonatate  (TESSALON ) 200 MG capsule Take 1 capsule (200 mg total) by mouth 3 (three) times daily as needed for cough. 30 capsule 3   Butenafine  HCl (MENTAX) 1 % cream Apply 1 application topically 2 (two) times daily. 24 g 1   Camphor-Menthol-Methyl Sal (TIGER BALM MUSCLE RUB EX) Apply 1 oz topically as needed (patient uses asa needed).     Cholecalciferol (VITAMIN D3 PO) Take 2,000 mg by mouth daily. On Hold     Docusate Sodium  (COLACE PO) Take 1 capsule by mouth daily.     EPINEPHrine  0.3 mg/0.3 mL IJ SOAJ injection Inject 0.3 mg into the muscle as needed for  anaphylaxis.     ergocalciferol  (VITAMIN D2) 1.25 MG (50000 UT) capsule Take 1 capsule (50,000 Units total) by mouth once a week. (Patient not taking: Reported on 11/16/2023) 8 capsule 0   fenofibrate  (TRICOR ) 145 MG tablet Take 1 tablet (145 mg total) by mouth daily. 90 tablet 1   fluticasone  (FLONASE ) 50 MCG/ACT nasal spray Place 2 sprays into both nostrils daily. 16 g 3   ibuprofen  (ADVIL ) 800 MG tablet Take 1 tablet (800 mg total) by mouth 3 (three) times daily. 30 tablet 0   ipratropium-albuterol  (DUONEB) 0.5-2.5 (3) MG/3ML SOLN Take 3 mLs by nebulization every 6 (six) hours as needed (shortness of breath, wheezing, and persistent cough). 360 mL 0   levonorgestrel  (MIRENA ) 20 MCG/24HR IUD 1 each by Intrauterine route once.      LORazepam  (ATIVAN ) 1 MG tablet Take 1 tablet (1 mg total) by mouth every 8 (eight) hours as needed for anxiety (mri). 3 tablet 0   Menthol, Topical Analgesic, (BIOFREEZE) 10 % CREA Apply 1 Application topically daily as needed (pain).     ondansetron  (ZOFRAN -ODT) 8 MG disintegrating tablet Take 1 tablet (8 mg total) by mouth every 8 (eight) hours as needed for nausea or vomiting. 20 tablet 1   oseltamivir  (TAMIFLU ) 75 MG capsule Take 1 capsule (75 mg total) by mouth 2 (two) times daily. (Patient not taking: Reported on 11/16/2023) 10 capsule 0   Pancrelipase , Lip-Prot-Amyl, (CREON ) 24000-76000 units CPEP Take 2 capsules by mouth See admin instructions. Take 2 capsules three times daily with food and 1 capsule twice daily with snacks.     pantoprazole  (PROTONIX ) 20 MG tablet Take 20 mg by mouth daily.     promethazine -dextromethorphan  (PROMETHAZINE -DM) 6.25-15 MG/5ML syrup Take 5 mLs by mouth 3 (three) times daily as needed for cough. 140 mL 0   psyllium (REGULOID) 0.52 g capsule Take 0.52 capsules by mouth as needed.     rosuvastatin  (CRESTOR ) 40 MG tablet Take 1 tablet (40 mg total) by mouth daily. 90 tablet 3   Zinc  Sulfate (ZINC  15 PO) Take 15 mg by mouth daily.     No  current facility-administered medications on file prior to visit.   Allergies  Allergen Reactions   Bahia Hives, Shortness Of Breath and Swelling  Found through allergy testing, not sure which item caused the shortness of breath   [place] Grass Hives, Shortness Of Breath and Swelling    Found through allergy testing, not sure which item caused the shortness of breath   [employer], Shortness Of Breath and Swelling    Found through allergy testing, not sure which item caused the shortness of breath   Cladosporium Cladosporioides Hives, Shortness Of Breath and Swelling    Found through allergy testing, not sure which item caused the shortness of breath   Dust Mite Extract Hives, Shortness Of Breath and Swelling    Found through allergy testing, not sure which item caused the shortness of breath   Elm Bark [Ulmus Fulva] Hives, Shortness Of Breath and Swelling    Found through allergy testing, not sure which item caused the shortness of breath   Molds & Smuts Hives, Shortness Of Breath and Swelling    Found through allergy testing, not sure which item caused the shortness of breath   Mugwort Hives, Shortness Of Breath and Swelling    Found through allergy testing, not sure which item caused the shortness of breath   Nettle [Nettle (Urtica Dioica)] Hives, Shortness Of Breath and Swelling   Red [place] Allergy Skin Test Hives, Shortness Of Breath and Swelling    Found through allergy testing, not sure which item caused the shortness of breath   Sorrel-Dock Mix [Sheep Sorrel-Yellow Dock] Hives, Shortness Of Breath and Swelling    Found through allergy testing, not sure which item caused the shortness of breath   Timothy Grass Pollen Allergen Hives, Shortness Of Breath and Swelling    Found through allergy testing, not sure which item caused the shortness of breath   American Cockroach    Mixed Ragweed    Family History  Problem Relation Age of Onset   Hypertension Mother    Diabetes  Mother    Colon cancer Mother    Arthritis Mother    Thyroid  disease Mother    Asthma Mother    Osteoporosis Mother    Diabetes Father    Hypertension Father    Bell's palsy Father    Hypertension Sister    Heart murmur Sister    Lupus Sister    Hypertension Sister    Polycystic ovary syndrome Sister    Scoliosis Sister    Hypertension Brother    Heart failure Brother    Cancer Brother    Other Paternal Aunt        pituitary surgery   PE: BP 120/80   Pulse 71   Ht 5' 9 (1.753 m)   Wt 279 lb 3.2 oz (126.6 kg)   SpO2 97%   BMI 41.23 kg/m  Wt Readings from Last 15 Encounters:  12/27/23 279 lb 3.2 oz (126.6 kg)  11/22/23 276 lb (125.2 kg)  10/08/23 279 lb (126.6 kg)  10/05/23 279 lb (126.6 kg)  07/10/23 282 lb 3.2 oz (128 kg)  06/29/23 285 lb 12.8 oz (129.6 kg)  06/14/23 274 lb 11.2 oz (124.6 kg)  03/30/23 281 lb 9.6 oz (127.7 kg)  01/25/23 280 lb (127 kg)  12/25/22 272 lb 3.2 oz (123.5 kg)  12/21/22 274 lb 9.6 oz (124.6 kg)  12/02/22 274 lb (124.3 kg)  07/18/22 279 lb 12.8 oz (126.9 kg)  07/07/22 279 lb 3.2 oz (126.6 kg)  06/22/22 279 lb 3.2 oz (126.6 kg)   Constitutional: overweight, in NAD Eyes: EOMI, no exophthalmos ENT: no thyromegaly, no cervical lymphadenopathy Cardiovascular:  RRR, No MRG Respiratory: CTA B Musculoskeletal: no deformities Skin: no rashes Neurological: no tremor with outstretched hands  ASSESSMENT: 1. Pituitary macroadenoma - s/p resection 01/09/2022  2. H/o Hyperprolactinemia  3.  Low TSH  4. DI  PLAN:  1.  Patient with history of pituitary macroadenoma, diagnosed after having had several years of evaded prolactin -The tumor was very large, impinging upon the optic chiasm.  This was suspected at the time of her visit with ophthalmology in 2023, due to visual field cuts -we checked pituitary labs, which returned grossly normal including prolactin, but patient was on bromocriptine  at that time.  We also checked the prolactin level  with dilution, 30-minute possible hook effect and this returned normal, also -I refer patient to neurosurgery and she had transsphenoidal surgery on 01/04/2022.  The tumor was debulked but it was not possible to be completely resected, and she had residual tumor in the cavernous sinus, not amenable to surgery.  Latest MRI is from 06/07/2023 and this showed decrease in size of her residual tumor -The pathology returned as neuroendocrine tumor without further characterization.  We asked for further staining and  a SF1 marker was positive, pointing towards gonadotropin  etiology.  This is good news, since gonadotroph tumors are usually inactive and less likely to spread. 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 - She continues to have some memory problems after her radiation therapy but otherwise has vague symptoms, and she was not sure whether this was related to the pituitary adenoma, radiation itself or her other medical issues. -At today's visit, she denies increased headaches, visual problems, or galactorrhea. - Will repeat her TFTs and her pituitary tests -she will return for these fasting: Orders Placed This Encounter  Procedures   TSH   T4, free   T3, free   Prolactin   Insulin -like growth factor   Follicle stimulating hormone   ACTH    Luteinizing hormone   Cortisol  - She was following with Dr. Rockney, but he left practice.  He is not seeing Dr. Buckley with oncology.  She has an appointment with him coming up in 5 months, after another pituitary MRI.  2.   Hyperprolactinemia -Patient with 10-year history of slightly elevated prolactin, previously treated with bromocriptine  -In 2023, pituitary MRI showed a large pituitary tumor, and the high prolactin was most likely related to pituitary stalk compression -we stopped bromocriptine  after her transsphenoidal surgery -At last visit, prolactin level was normal.  We will repeat this with the rest of her pituitary labs.  3.  Low TSH - She initially had a low TSH around the time of her surgery, which normalized but she had another low TSH in 05/2022, with all subsequent TFTs being normal afterwards. - Latest TFTs were from a year ago and they were normal - We will repeat this with next lab draw  4. DI -previously on DDAVP  0.1 mg twice a day. - We reduced the dose and she was able to stop this without discomfort -No increased thirst today.  She does have a history of a weak bladder wall with mild urinary  incontinence, for which she is seeing urology. -Latest sodium level was normal, at 141, in 09/2022.  Component     Latest Ref Rng 01/04/2024  TSH     mIU/L 0.86   Prolactin     ng/mL 17.4   T4,Free(Direct)     0.8 - 1.8 ng/dL 0.9   Cortisol, Plasma     mcg/dL 83.5   Triiodothyronine,Free,Serum     2.3 - 4.2 pg/mL 2.6   FSH     mIU/mL 5.7   LH     mIU/mL 1.7   IGF-I, LC/MS     52 - 328 ng/mL 81   Z-Score (Female)     -2.0 - 2.0 SD -1.1   C206 ACTH      6 - 50 pg/mL 29    Labs are all normal.  Lela Fendt, MD PhD [facility]

## 2023-12-31 NOTE — Therapy (Unsigned)
 OUTPATIENT PHYSICAL THERAPY NOTE   Patient Name: Beth [name] MRN: [id number] DOB:Apr 01, 1976, 48 y.o., female Today's Date: 01/01/2024  END OF SESSION:   PT End of Session - 01/01/24 1820     Visit Number 13    Number of Visits 19    Date for PT Re-Evaluation 01/19/24    Authorization Type BCBS    PT Start Time 1745    PT Stop Time 1825    PT Time Calculation (min) 40 min    Activity Tolerance Patient tolerated treatment well    Behavior During Therapy [facility] for tasks assessed/performed           PT End of Session - 01/01/24 1820     Visit Number 13    Number of Visits 19    Date for PT Re-Evaluation 01/19/24    Authorization Type BCBS    PT Start Time 1745    PT Stop Time 1825    PT Time Calculation (min) 40 min    Activity Tolerance Patient tolerated treatment well    Behavior During Therapy [facility] for tasks assessed/performed            PT End of Session - 01/01/24 1820     Visit Number 13    Number of Visits 19    Date for PT Re-Evaluation 01/19/24    Authorization Type BCBS    PT Start Time 1745    PT Stop Time 1825    PT Time Calculation (min) 40 min    Activity Tolerance Patient tolerated treatment well    Behavior During Therapy WFL for tasks assessed/performed              Past Medical History:  Diagnosis Date   Asthma    Chronic pancreatitis (HCC)    DDD (degenerative disc disease), lumbosacral    DJD (degenerative joint disease)    Headache    Hyperlipidemia    Hypertension    Hypoglycemia    Obesity    Osteoarthritis    Prolactinoma (HCC)    Sciatica    Sleep apnea    Past Surgical History:  Procedure Laterality Date   CHOLECYSTECTOMY     COLONOSCOPY     CRANIOTOMY N/A 01/04/2022   Procedure: Endonasal endoscopic resection of pituitary tumor;  Surgeon: Cheryle Debby LABOR, MD;  Location: [facility] OR;  Service: Neurosurgery;  Laterality: N/A;  RM 20   NASAL SINUS SURGERY N/A 01/04/2022   Procedure: ENDOSCOPIC SINUS SURGERY  TRANSNASAL PITUITARY REMOVAL WITH NASAL NASOSETTAL FLAP;  Surgeon: Llewellyn Gerard LABOR, DO;  Location: [facility] OR;  Service: ENT;  Laterality: N/A;   Patient Active Problem List   Diagnosis Date Noted   Class 3 severe obesity due to excess calories with body mass index (BMI) of 40.0 to 44.9 in adult 03/30/2023   Hypoglycemia 06/16/2022   Status post transsphenoidal pituitary resection (HCC) 01/04/2022   Low TSH level 10/21/2021   Dyslipidemia 07/15/2021   Osteoarthritis 08/20/2020   Sciatica 07/30/2020   DJD (degenerative joint disease)    Obstructive sleep apnea 03/11/2020   Snoring 02/05/2020   Pneumonia due to COVID-19 virus 12/15/2019   Acute respiratory failure with hypoxia (HCC) 12/15/2019   Hypokalemia 12/15/2019   Asthma without acute exacerbation 12/15/2019   Encounter for management of intrauterine contraceptive device (IUD) 06/09/2019   Essential hypertension 06/09/2019   Fatigue 06/09/2019   Slow transit constipation 06/09/2019   Hyperprolactinemia (HCC) 02/15/2018   Migraine 10/12/2017   Chronic pancreatitis (HCC) 11/09/2016  Prolactinoma (HCC) 11/09/2016   BMI 38.0-38.9,adult 11/09/2016   Candida rash of groin 11/09/2016   Mild intermittent asthma 11/09/2016   Mixed hyperlipidemia 11/09/2016    PCP: Celestia Rosaline SQUIBB, NP   REFERRING PROVIDER: Teressa Rainell BROCKS, DO  REFERRING DIAG: [phone] (ICD-10-CM) - Rotator cuff tendinitis, right M17.12 (ICD-10-CM) - Primary osteoarthritis of left knee  THERAPY DIAG:  Chronic pain of left knee  Chronic right shoulder pain  Unsteadiness on feet  Rationale for Evaluation and Treatment: Rehabilitation  ONSET DATE: Acute for shoulder, chronic for knee  SUBJECTIVE:                                                                                                                                                                                      SUBJECTIVE STATEMENT: Only mild soreness noted following last session which resolved  with ice and time.    Hand dominance: Right  PERTINENT HISTORY: Also with right shoulder shoulder pain for the last 3 weeks.  No injury or trauma. She does note when she gets out of bed she typically pushes up on the right side No radiation of pain No numbness or tingling Previous shoulder issues for which she did physical therapy following COVID hospitalization in 2021 She has started doing some shoulder circles and wall crawls Evaluated at urgent care 10/05/23 -X-rays were unremarkable of the shoulder - Was given a sling - Rx ibuprofen  800 mg 3 times daily as needed.  This has been somewhat helpful - Recommended to follow-up with sports medicine  Patient with left knee pain since 04/2023 and right shoulder pain x 3 weeks Left medial knee pain without any specific injury or trauma Pain along the medial knee Some occasional clicking and popping Seen in urgent care 10/05/2023 - Urgent care notes reviewed in detail during the visit today - Fitted with a knee brace - X-ray showed mild osteoarthritis Given Rx ibuprofen  800 mg 3 times daily as needed which has been somewhat helpful  PAIN:  Are you having pain? Yes: NPRS scale: see subjective/10 Pain location: L knee Pain description: ache Aggravating factors: descending stairs, prolonged walking/sitting Relieving factors: heat, topical gels  PAIN:  Are you having pain? 12/08/23 - R shoulder: 2/10 currently, 1-10/10 in past week - L knee: 1/10 currently, 1-8/10  Prior to 12/08/23 visit: NPRS scale: 10/10 Pain location: R shoulder Pain description: ache, sharp Aggravating factors: OH motions, unexpected motion Relieving factors: massage, heat/ice   PRECAUTIONS: None  RED FLAGS: None   WEIGHT BEARING RESTRICTIONS: No  FALLS:  Has patient fallen in last 6 months? No  OCCUPATION: Office work  PLOF: Independent  PATIENT GOALS:To manage my shoulder  and knee pain  NEXT MD VISIT: 6 weeks  OBJECTIVE:  Note: Objective  measures were completed at Evaluation unless otherwise noted.  DIAGNOSTIC FINDINGS:  Right shoulder x-ray 10/05/2023 at urgent care personally reviewed and interpreted by me showing: - Mild A/C joint degenerative changes - No other bony abnormalities   Left knee x-ray 10/05/2023 4 views AP/lateral/obliques personally reviewed and interpreted by me today showing: - Mild degenerative changes along the medial and patellofemoral compartments - No other bony abnormalities  PATIENT SURVEYS:  LEFS 16/80  12/08/23 LEFS 31/80  POSTURE: Rounded shoulders  UPPER EXTREMITY ROM:   A/PROM Right eval Left eval R 12/08/23  Shoulder flexion 90/160  A: 153 deg  Shoulder extension     Shoulder abduction 90/160    Shoulder adduction     Shoulder internal rotation     Shoulder external rotation     Elbow flexion     Elbow extension     Wrist flexion     Wrist extension     Wrist ulnar deviation     Wrist radial deviation     Wrist pronation     Wrist supination     (Blank rows = not tested)  UPPER EXTREMITY MMT:  MMT Right eval Left eval R/L 12/08/23    Shoulder flexion 4-  4+*/5  Shoulder extension     Shoulder abduction 4-  4+*/5  Shoulder adduction     Shoulder internal rotation     Shoulder external rotation     Middle trapezius     Lower trapezius     Elbow flexion     Elbow extension     Wrist flexion     Wrist extension     Wrist ulnar deviation     Wrist radial deviation     Wrist pronation     Wrist supination     Grip strength (lbs)     (Blank rows = not tested)  SHOULDER SPECIAL TESTS: Impingement tests: Neer impingement test: positive  and Hawkins/Kennedy impingement test: positive  Rotator cuff assessment: Drop arm test: negative, Empty can test: negative, and Hornblower's sign: negative   LOWER EXTREMITY MMT:    MMT Right eval Left eval R/L 12/08/23    Hip flexion     Hip extension     Hip abduction     Hip adduction     Hip internal rotation     Hip  external rotation     Knee flexion  4 5/5  Knee extension  4- 5/4*  Ankle dorsiflexion     Ankle plantarflexion     Ankle inversion     Ankle eversion      (Blank rows = not tested)   LOWER EXTREMITY ROM:     Active  Right eval Left eval L 12/08/23   Hip flexion     Hip extension     Hip abduction     Hip adduction     Hip internal rotation     Hip external rotation     Knee flexion 125 135 A: 130 deg *  Knee extension     Ankle dorsiflexion     Ankle plantarflexion     Ankle inversion     Ankle eversion      (Blank rows = not tested)   LOWER EXTREMITY SPECIAL TESTS:  Knee special tests: Lateral pull sign: negative and varus/valgus stress negative for laxity  PALPATION:  TTP R biceps tendon, AC joint   Functional testing:  30s chair stand test 0 reps   12/08/23:  - 30secSTS 4 reps (beginning ascent for fifth rep at 30sec mark), moderate exertion and knee pain                                                                                                                           TREATMENT DATE:  [facility] Adult PT Treatment:                                                DATE: 01/01/24 Therapeutic Exercise: Nustep L5 8 min Neuromuscular re-ed: Prone R shoulder extension 500g ball 15x Prone R shoulder flexion 500g ball 15x Prone R shoulder row 500g ball 15x Prone R shoulder hor abd 500g ball 15x Prone R shoulder scaption 500g ball 15x Therapeutic Activity: SLR L 15x 2# SAQs 5# 15x emphasis on eccentric motion TKEs BluTB 15x 3 way pull  [facility] Adult PT Treatment:                                                DATE: 12/27/23 Therapeutic Exercise: Nustep L4 6 min Neuromuscular re-ed: Supine press/protraction 1000 ball 15x Supine OH flexion 1000g ball 15x Rhythmic stab at 90d flexion 30s x3 Therapeutic Activity: Supine hor abd YTB 15x B, 15/15 unilaterally SLR L 15x SAQs 3# 15x emphasis on eccentric motion TKEs BluTB 15x 3 way pull   [facility] Adult PT Treatment:                                                 DATE: 12/26/2023  Neuromuscular Re-ed:  Supine dowel AAROM flexion, 2 x 10  Supine dowel AAROM Abduction, poor tolerance today x 8  Supine shoulder flexion with resisted abduction 2 x 10  Supine shoulder flexion/abduction 2 x 8 PNF/Reversal of Antagonist  Therapeutic Activity:  UBE level 2 x 2.5 minute forward/2.5 minute backward Extra time discussing important patient goals for remaining POC   Resistance today provided by manual resistance from clinician unless otherwise noted     PATIENT EDUCATION: Education details: rationale for interventions, HEP, PT goals/POC Person educated: Patient Education method: Explanation, Demonstration, Tactile cues, Verbal cues Education comprehension: verbalized understanding, returned demonstration, verbal cues required, tactile cues required, and needs further education     HOME EXERCISE PROGRAM: Access Code: [email or web address] URL: [email or web address] Date: 12/25/2023 Prepared by: Marko Molt  Exercises - Standing Isometric Shoulder External Rotation with Doorway  - 2-3 x daily - 7 x weekly - 1 sets - 5 reps - Standing Isometric Shoulder Internal Rotation at Doorway  -  2-3 x daily - 7 x weekly - 1 sets - 5 reps - Standing 'L' Stretch at Counter  - 2-3 x daily - 7 x weekly - 1 sets - 5 reps - Seated Heel Toe Raises  - 2-3 x daily - 7 x weekly - 1 sets - 10 reps - Seated Long Arc Quad  - 2-3 x daily - 7 x weekly - 1 sets - 10 reps - Horizontal Shoulder Pendulum with Table Support  - 2 x daily - 7 x weekly - 30 sec hold - Circular Shoulder Pendulum with Table Support  - 2 x daily - 7 x weekly - 30 sec hold  ASSESSMENT:   CLINICAL IMPRESSION:  Increased resistance and time on aerobic task for strength and stamina.  Introduced prone shoulder exercises for posterior shoulder strengthening and control.  Increased weight/resistance on SAQ/SLR tasks with patient able to demo w/o increased  symptoms.   RECERT 12/08/2023: Pt arrives w/ report of 1-2/10 pain for shoulder/knee, improvement since fall but symptoms remain exacerbated; received MRI results which were reassuring, refer to [facility] for details. Looking at goals, pt demonstrates notable improvements in MMT/ROM and 30sec STS despite symptom exacerbation post fall, although deficits do persist and she has not yet fully met LTG. Continues to require rest breaks to maximize tolerance. HEP update today with emphasis on strategies to mitigate stiffness with prolonged sitting. Recommend extension of POC to address relevant deficits, minimize pain, and maximize functional tolerance. No adverse events, pt reports some UT soreness on departure but otherwise no change in symptoms. Pt departs today's session in no acute distress, all voiced questions/concerns addressed appropriately from PT perspective.       OBJECTIVE IMPAIRMENTS: Abnormal gait, decreased activity tolerance, decreased endurance, decreased knowledge of condition, decreased knowledge of use of DME, decreased mobility, difficulty walking, decreased ROM, decreased strength, impaired perceived functional ability, impaired UE functional use, postural dysfunction, obesity, and pain.   ACTIVITY LIMITATIONS: carrying, lifting, sitting, standing, squatting, stairs, dressing, reach over head, and locomotion level  PERSONAL FACTORS: Age, Behavior pattern, Fitness, Past/current experiences, Time since onset of injury/illness/exacerbation, and 1 comorbidity: Hx of long Covid are also affecting patient's functional outcome.   REHAB POTENTIAL: Good  CLINICAL DECISION MAKING: Stable/uncomplicated  EVALUATION COMPLEXITY: Moderate   GOALS: Goals reviewed with patient? No  SHORT TERM GOALS: Target date: 11/14/2023    Patient to demonstrate independence in HEP  Baseline: TBD due to time constraints 11/16/23: reports good HEP adherence Goal status: MET  2.  Patient will increase AROM R  shoulder to 160d flexion an abduction Baseline:  A/PROM Right eval Left eval  Shoulder flexion 90/160   Shoulder extension    Shoulder abduction 90/160    11/16/23: gross observation ~140 deg AAROM against gravity 12/08/23: 153 deg Goal status: PROGRESSING  3.  Increase L knee flexion to 130d Baseline: 125d 12/08/23: 130 deg painful Goal status: MET    LONG TERM GOALS: Target date: 01/19/2024  (Updated 12/08/23)    Patient will acknowledge 6/10 pain at least once during episode of care  in L knee and R shoulder Baseline: 10/10 12/08/23:  R shoulder: 2/10 currently, 1-10/10 in past week; L knee: 1/10 currently, 1-8/10 Goal status: MET  2.  Patient will score at least 45/80 on LEFS to signify clinically meaningful improvement in functional abilities.   Baseline: 16/80 12/08/23: 31/80 (meeting initial goal) Goal status: NEW/UPDATED 12/08/23  3.  Patient will increase 30s chair stand reps from 0 to 5 without  arms to demonstrate and improved functional ability with less pain/difficulty as well as reduce fall risk.  Baseline:  12/08/23: 4 reps w/ exertion/pain Goal status: PROGRESSING  4.  Increase R shoulder strength to 4/5 Baseline:  MMT Right eval Left eval  Shoulder flexion 4-   Shoulder extension    Shoulder abduction 4-    12/08/23: see MMT chart above Goal status: MET  5.  Increase L knee strength to 4+/5 Baseline:  MMT Right eval Left eval  Hip flexion    Hip extension    Hip abduction    Hip adduction    Hip internal rotation    Hip external rotation    Knee flexion  4  Knee extension  4-   12/08/23: see MMT chart above Goal status: PROGRESSING    PLAN: (updated 12/08/23)  PT FREQUENCY: 1-2x/week  PT DURATION: 6 weeks  PLANNED INTERVENTIONS: 97110-Therapeutic exercises, 97530- Therapeutic activity, 97112- Neuromuscular re-education, 97535- Self Care, [id number]- Manual therapy, [phone]- Gait training, and Patient/Family education  PLAN FOR NEXT SESSION:  HEP review and update, manual techniques as appropriate, aerobic tasks, ROM and flexibility activities, strengthening and PREs, TPDN, gait and balance training as needed     01/01/2024

## 2024-01-01 ENCOUNTER — Ambulatory Visit

## 2024-01-01 ENCOUNTER — Ambulatory Visit: Admitting: Family Medicine

## 2024-01-01 DIAGNOSIS — M25562 Pain in left knee: Secondary | ICD-10-CM | POA: Diagnosis not present

## 2024-01-01 DIAGNOSIS — G8929 Other chronic pain: Secondary | ICD-10-CM | POA: Diagnosis not present

## 2024-01-01 DIAGNOSIS — R2681 Unsteadiness on feet: Secondary | ICD-10-CM | POA: Diagnosis not present

## 2024-01-01 DIAGNOSIS — M25511 Pain in right shoulder: Secondary | ICD-10-CM | POA: Diagnosis not present

## 2024-01-03 ENCOUNTER — Ambulatory Visit

## 2024-01-03 DIAGNOSIS — R2681 Unsteadiness on feet: Secondary | ICD-10-CM

## 2024-01-03 DIAGNOSIS — M25562 Pain in left knee: Secondary | ICD-10-CM | POA: Diagnosis not present

## 2024-01-03 DIAGNOSIS — G8929 Other chronic pain: Secondary | ICD-10-CM | POA: Diagnosis not present

## 2024-01-03 DIAGNOSIS — M25511 Pain in right shoulder: Secondary | ICD-10-CM | POA: Diagnosis not present

## 2024-01-03 NOTE — Therapy (Signed)
 OUTPATIENT PHYSICAL THERAPY NOTE   Patient Name: Beth [name] MRN: [id number] DOB:1975/10/12, 48 y.o., female Today's Date: 01/03/2024  END OF SESSION:   PT End of Session - 01/03/24 1738     Visit Number 14    Number of Visits 19    Date for PT Re-Evaluation 01/19/24    Authorization Type BCBS    PT Start Time 1735    PT Stop Time 1815    PT Time Calculation (min) 40 min    Activity Tolerance Patient tolerated treatment well    Behavior During Therapy [facility] for tasks assessed/performed           PT End of Session - 01/03/24 1738     Visit Number 14    Number of Visits 19    Date for PT Re-Evaluation 01/19/24    Authorization Type BCBS    PT Start Time 1735    PT Stop Time 1815    PT Time Calculation (min) 40 min    Activity Tolerance Patient tolerated treatment well    Behavior During Therapy [facility] for tasks assessed/performed            PT End of Session - 01/03/24 1738     Visit Number 14    Number of Visits 19    Date for PT Re-Evaluation 01/19/24    Authorization Type BCBS    PT Start Time 1735    PT Stop Time 1815    PT Time Calculation (min) 40 min    Activity Tolerance Patient tolerated treatment well    Behavior During Therapy WFL for tasks assessed/performed              Past Medical History:  Diagnosis Date   Asthma    Chronic pancreatitis (HCC)    DDD (degenerative disc disease), lumbosacral    DJD (degenerative joint disease)    Headache    Hyperlipidemia    Hypertension    Hypoglycemia    Obesity    Osteoarthritis    Prolactinoma (HCC)    Sciatica    Sleep apnea    Past Surgical History:  Procedure Laterality Date   CHOLECYSTECTOMY     COLONOSCOPY     CRANIOTOMY N/A 01/04/2022   Procedure: Endonasal endoscopic resection of pituitary tumor;  Surgeon: Cheryle Debby LABOR, MD;  Location: [facility] OR;  Service: Neurosurgery;  Laterality: N/A;  RM 20   NASAL SINUS SURGERY N/A 01/04/2022   Procedure: ENDOSCOPIC SINUS SURGERY  TRANSNASAL PITUITARY REMOVAL WITH NASAL NASOSETTAL FLAP;  Surgeon: Llewellyn Gerard LABOR, DO;  Location: [facility] OR;  Service: ENT;  Laterality: N/A;   Patient Active Problem List   Diagnosis Date Noted   Class 3 severe obesity due to excess calories with body mass index (BMI) of 40.0 to 44.9 in adult 03/30/2023   Hypoglycemia 06/16/2022   Status post transsphenoidal pituitary resection (HCC) 01/04/2022   Low TSH level 10/21/2021   Dyslipidemia 07/15/2021   Osteoarthritis 08/20/2020   Sciatica 07/30/2020   DJD (degenerative joint disease)    Obstructive sleep apnea 03/11/2020   Snoring 02/05/2020   Pneumonia due to COVID-19 virus 12/15/2019   Acute respiratory failure with hypoxia (HCC) 12/15/2019   Hypokalemia 12/15/2019   Asthma without acute exacerbation 12/15/2019   Encounter for management of intrauterine contraceptive device (IUD) 06/09/2019   Essential hypertension 06/09/2019   Fatigue 06/09/2019   Slow transit constipation 06/09/2019   Hyperprolactinemia (HCC) 02/15/2018   Migraine 10/12/2017   Chronic pancreatitis (HCC) 11/09/2016  Prolactinoma (HCC) 11/09/2016   BMI 38.0-38.9,adult 11/09/2016   Candida rash of groin 11/09/2016   Mild intermittent asthma 11/09/2016   Mixed hyperlipidemia 11/09/2016    PCP: Celestia Rosaline SQUIBB, NP   REFERRING PROVIDER: Teressa Rainell BROCKS, DO  REFERRING DIAG: [phone] (ICD-10-CM) - Rotator cuff tendinitis, right M17.12 (ICD-10-CM) - Primary osteoarthritis of left knee  THERAPY DIAG:  Chronic pain of left knee  Chronic right shoulder pain  Unsteadiness on feet  Rationale for Evaluation and Treatment: Rehabilitation  ONSET DATE: Acute for shoulder, chronic for knee  SUBJECTIVE:                                                                                                                                                                                      SUBJECTIVE STATEMENT: Good tolerance to last session with only minimal post session  discomfort.  Symptoms manages well with ice and rest.      Hand dominance: Right  PERTINENT HISTORY: Also with right shoulder shoulder pain for the last 3 weeks.  No injury or trauma. She does note when she gets out of bed she typically pushes up on the right side No radiation of pain No numbness or tingling Previous shoulder issues for which she did physical therapy following COVID hospitalization in 2021 She has started doing some shoulder circles and wall crawls Evaluated at urgent care 10/05/23 -X-rays were unremarkable of the shoulder - Was given a sling - Rx ibuprofen  800 mg 3 times daily as needed.  This has been somewhat helpful - Recommended to follow-up with sports medicine  Patient with left knee pain since 04/2023 and right shoulder pain x 3 weeks Left medial knee pain without any specific injury or trauma Pain along the medial knee Some occasional clicking and popping Seen in urgent care 10/05/2023 - Urgent care notes reviewed in detail during the visit today - Fitted with a knee brace - X-ray showed mild osteoarthritis Given Rx ibuprofen  800 mg 3 times daily as needed which has been somewhat helpful  PAIN:  Are you having pain? Yes: NPRS scale: see subjective/10 Pain location: L knee Pain description: ache Aggravating factors: descending stairs, prolonged walking/sitting Relieving factors: heat, topical gels  PAIN:  Are you having pain? 12/08/23 - R shoulder: 2/10 currently, 1-10/10 in past week - L knee: 1/10 currently, 1-8/10  Prior to 12/08/23 visit: NPRS scale: 10/10 Pain location: R shoulder Pain description: ache, sharp Aggravating factors: OH motions, unexpected motion Relieving factors: massage, heat/ice   PRECAUTIONS: None  RED FLAGS: None   WEIGHT BEARING RESTRICTIONS: No  FALLS:  Has patient fallen in last 6 months? No  OCCUPATION: Office work  PLOF: Independent  PATIENT GOALS:To manage my shoulder and knee pain  NEXT MD VISIT: 6  weeks  OBJECTIVE:  Note: Objective measures were completed at Evaluation unless otherwise noted.  DIAGNOSTIC FINDINGS:  Right shoulder x-ray 10/05/2023 at urgent care personally reviewed and interpreted by me showing: - Mild A/C joint degenerative changes - No other bony abnormalities   Left knee x-ray 10/05/2023 4 views AP/lateral/obliques personally reviewed and interpreted by me today showing: - Mild degenerative changes along the medial and patellofemoral compartments - No other bony abnormalities  PATIENT SURVEYS:  LEFS 16/80  12/08/23 LEFS 31/80  POSTURE: Rounded shoulders  UPPER EXTREMITY ROM:   A/PROM Right eval Left eval R 12/08/23  Shoulder flexion 90/160  A: 153 deg  Shoulder extension     Shoulder abduction 90/160    Shoulder adduction     Shoulder internal rotation     Shoulder external rotation     Elbow flexion     Elbow extension     Wrist flexion     Wrist extension     Wrist ulnar deviation     Wrist radial deviation     Wrist pronation     Wrist supination     (Blank rows = not tested)  UPPER EXTREMITY MMT:  MMT Right eval Left eval R/L 12/08/23    Shoulder flexion 4-  4+*/5  Shoulder extension     Shoulder abduction 4-  4+*/5  Shoulder adduction     Shoulder internal rotation     Shoulder external rotation     Middle trapezius     Lower trapezius     Elbow flexion     Elbow extension     Wrist flexion     Wrist extension     Wrist ulnar deviation     Wrist radial deviation     Wrist pronation     Wrist supination     Grip strength (lbs)     (Blank rows = not tested)  SHOULDER SPECIAL TESTS: Impingement tests: Neer impingement test: positive  and Hawkins/Kennedy impingement test: positive  Rotator cuff assessment: Drop arm test: negative, Empty can test: negative, and Hornblower's sign: negative   LOWER EXTREMITY MMT:    MMT Right eval Left eval R/L 12/08/23    Hip flexion     Hip extension     Hip abduction     Hip  adduction     Hip internal rotation     Hip external rotation     Knee flexion  4 5/5  Knee extension  4- 5/4*  Ankle dorsiflexion     Ankle plantarflexion     Ankle inversion     Ankle eversion      (Blank rows = not tested)   LOWER EXTREMITY ROM:     Active  Right eval Left eval L 12/08/23   Hip flexion     Hip extension     Hip abduction     Hip adduction     Hip internal rotation     Hip external rotation     Knee flexion 125 135 A: 130 deg *  Knee extension     Ankle dorsiflexion     Ankle plantarflexion     Ankle inversion     Ankle eversion      (Blank rows = not tested)   LOWER EXTREMITY SPECIAL TESTS:  Knee special tests: Lateral pull sign: negative and varus/valgus stress negative for laxity  PALPATION:  TTP R  biceps tendon, AC joint   Functional testing:  30s chair stand test 0 reps   12/08/23:  - 30secSTS 4 reps (beginning ascent for fifth rep at 30sec mark), moderate exertion and knee pain                                                                                                                           TREATMENT DATE:  [facility] Adult PT Treatment:                                                DATE: 01/03/24 Therapeutic Exercise: Nustep L6 8 min Neuromuscular re-ed: Prone R shoulder extension 1000g ball 15x Prone R shoulder flexion 1000g ball 15x Prone R shoulder row 1000g ball 15x Prone R shoulder hor abd 1000g ball 15x Prone R shoulder scaption 1000g ball 10x Therapeutic Activity: Heel raise off of 4 in step Sidestepping against yellow band 2 trips Runners step 4 in 10/10 w/UE support TKEs BlaTB 10x 3 way pull  [facility] Adult PT Treatment:                                                DATE: 01/01/24 Therapeutic Exercise: Nustep L5 8 min Neuromuscular re-ed: Prone R shoulder extension 500g ball 10x Prone R shoulder flexion 500g ball 10x Prone R shoulder row 500g ball 15x Prone R shoulder hor abd 500g ball 10x Prone R shoulder scaption 500g  ball 15x Therapeutic Activity: SLR L 15x 2# SAQs 5# 15x emphasis on eccentric motion TKEs BluTB 15x 3 way pull  [facility] Adult PT Treatment:                                                DATE: 12/27/23 Therapeutic Exercise: Nustep L4 6 min Neuromuscular re-ed: Supine press/protraction 1000 ball 15x Supine OH flexion 1000g ball 15x Rhythmic stab at 90d flexion 30s x3 Therapeutic Activity: Supine hor abd YTB 15x B, 15/15 unilaterally SLR L 15x SAQs 3# 15x emphasis on eccentric motion TKEs BluTB 15x 3 way pull   [facility] Adult PT Treatment:                                                DATE: 12/26/2023  Neuromuscular Re-ed:  Supine dowel AAROM flexion, 2 x 10  Supine dowel AAROM Abduction, poor tolerance today x 8  Supine shoulder flexion with resisted abduction 2 x 10  Supine shoulder flexion/abduction  2 x 8 PNF/Reversal of Antagonist  Therapeutic Activity:  UBE level 2 x 2.5 minute forward/2.5 minute backward Extra time discussing important patient goals for remaining POC   Resistance today provided by manual resistance from clinician unless otherwise noted     PATIENT EDUCATION: Education details: rationale for interventions, HEP, PT goals/POC Person educated: Patient Education method: Explanation, Demonstration, Tactile cues, Verbal cues Education comprehension: verbalized understanding, returned demonstration, verbal cues required, tactile cues required, and needs further education     HOME EXERCISE PROGRAM: Access Code: [email or web address] URL: [email or web address] Date: 12/25/2023 Prepared by: Marko Molt  Exercises - Standing Isometric Shoulder External Rotation with Doorway  - 2-3 x daily - 7 x weekly - 1 sets - 5 reps - Standing Isometric Shoulder Internal Rotation at Doorway  - 2-3 x daily - 7 x weekly - 1 sets - 5 reps - Standing 'L' Stretch at Counter  - 2-3 x daily - 7 x weekly - 1 sets - 5 reps - Seated Heel Toe Raises  - 2-3 x daily - 7 x weekly - 1 sets  - 10 reps - Seated Long Arc Quad  - 2-3 x daily - 7 x weekly - 1 sets - 10 reps - Horizontal Shoulder Pendulum with Table Support  - 2 x daily - 7 x weekly - 30 sec hold - Circular Shoulder Pendulum with Table Support  - 2 x daily - 7 x weekly - 30 sec hold  ASSESSMENT:   CLINICAL IMPRESSION:  Added weight and resistance to aerobic and posterior shoulder tasks as noted.  Described fatigue but able to complete all requested tasks.  Post exercise soreness managed with ice and rest.  Activity tolerance slowly increasing.  Advanced to ankle and hip strengthening as well as CKC proprioceptive tasks    RECERT 12/08/2023: Pt arrives w/ report of 1-2/10 pain for shoulder/knee, improvement since fall but symptoms remain exacerbated; received MRI results which were reassuring, refer to [facility] for details. Looking at goals, pt demonstrates notable improvements in MMT/ROM and 30sec STS despite symptom exacerbation post fall, although deficits do persist and she has not yet fully met LTG. Continues to require rest breaks to maximize tolerance. HEP update today with emphasis on strategies to mitigate stiffness with prolonged sitting. Recommend extension of POC to address relevant deficits, minimize pain, and maximize functional tolerance. No adverse events, pt reports some UT soreness on departure but otherwise no change in symptoms. Pt departs today's session in no acute distress, all voiced questions/concerns addressed appropriately from PT perspective.       OBJECTIVE IMPAIRMENTS: Abnormal gait, decreased activity tolerance, decreased endurance, decreased knowledge of condition, decreased knowledge of use of DME, decreased mobility, difficulty walking, decreased ROM, decreased strength, impaired perceived functional ability, impaired UE functional use, postural dysfunction, obesity, and pain.   ACTIVITY LIMITATIONS: carrying, lifting, sitting, standing, squatting, stairs, dressing, reach over head, and locomotion  level  PERSONAL FACTORS: Age, Behavior pattern, Fitness, Past/current experiences, Time since onset of injury/illness/exacerbation, and 1 comorbidity: Hx of long Covid are also affecting patient's functional outcome.   REHAB POTENTIAL: Good  CLINICAL DECISION MAKING: Stable/uncomplicated  EVALUATION COMPLEXITY: Moderate   GOALS: Goals reviewed with patient? No  SHORT TERM GOALS: Target date: 11/14/2023    Patient to demonstrate independence in HEP  Baseline: TBD due to time constraints 11/16/23: reports good HEP adherence Goal status: MET  2.  Patient will increase AROM R shoulder to 160d flexion an abduction Baseline:  A/PROM Right eval Left eval  Shoulder flexion 90/160   Shoulder extension    Shoulder abduction 90/160    11/16/23: gross observation ~140 deg AAROM against gravity 12/08/23: 153 deg Goal status: PROGRESSING  3.  Increase L knee flexion to 130d Baseline: 125d 12/08/23: 130 deg painful Goal status: MET    LONG TERM GOALS: Target date: 01/19/2024  (Updated 12/08/23)    Patient will acknowledge 6/10 pain at least once during episode of care  in L knee and R shoulder Baseline: 10/10 12/08/23:  R shoulder: 2/10 currently, 1-10/10 in past week; L knee: 1/10 currently, 1-8/10 Goal status: MET  2.  Patient will score at least 45/80 on LEFS to signify clinically meaningful improvement in functional abilities.   Baseline: 16/80 12/08/23: 31/80 (meeting initial goal) Goal status: NEW/UPDATED 12/08/23  3.  Patient will increase 30s chair stand reps from 0 to 5 without arms to demonstrate and improved functional ability with less pain/difficulty as well as reduce fall risk.  Baseline:  12/08/23: 4 reps w/ exertion/pain Goal status: PROGRESSING  4.  Increase R shoulder strength to 4/5 Baseline:  MMT Right eval Left eval  Shoulder flexion 4-   Shoulder extension    Shoulder abduction 4-    12/08/23: see MMT chart above Goal status: MET  5.  Increase L  knee strength to 4+/5 Baseline:  MMT Right eval Left eval  Hip flexion    Hip extension    Hip abduction    Hip adduction    Hip internal rotation    Hip external rotation    Knee flexion  4  Knee extension  4-   12/08/23: see MMT chart above Goal status: PROGRESSING    PLAN: (updated 12/08/23)  PT FREQUENCY: 1-2x/week  PT DURATION: 6 weeks  PLANNED INTERVENTIONS: 97110-Therapeutic exercises, 97530- Therapeutic activity, 97112- Neuromuscular re-education, 97535- Self Care, [id number]- Manual therapy, [phone]- Gait training, and Patient/Family education  PLAN FOR NEXT SESSION: HEP review and update, manual techniques as appropriate, aerobic tasks, ROM and flexibility activities, strengthening and PREs, TPDN, gait and balance training as needed     01/03/2024

## 2024-01-04 ENCOUNTER — Other Ambulatory Visit

## 2024-01-04 ENCOUNTER — Other Ambulatory Visit: Payer: Self-pay | Admitting: Licensed Clinical Social Worker

## 2024-01-04 DIAGNOSIS — E221 Hyperprolactinemia: Secondary | ICD-10-CM | POA: Diagnosis not present

## 2024-01-04 DIAGNOSIS — R7989 Other specified abnormal findings of blood chemistry: Secondary | ICD-10-CM | POA: Diagnosis not present

## 2024-01-04 DIAGNOSIS — D352 Benign neoplasm of pituitary gland: Secondary | ICD-10-CM | POA: Diagnosis not present

## 2024-01-07 ENCOUNTER — Ambulatory Visit (HOSPITAL_BASED_OUTPATIENT_CLINIC_OR_DEPARTMENT_OTHER)

## 2024-01-08 ENCOUNTER — Ambulatory Visit: Payer: Self-pay | Admitting: Internal Medicine

## 2024-01-08 LAB — FOLLICLE STIMULATING HORMONE: FSH: 5.7 m[IU]/mL

## 2024-01-08 LAB — INSULIN-LIKE GROWTH FACTOR
IGF-I, LC/MS: 81 ng/mL (ref 52–328)
Z-Score (Female): -1.1 {STDV} (ref ?–2.0)

## 2024-01-08 LAB — T4, FREE: Free T4: 0.9 ng/dL (ref 0.8–1.8)

## 2024-01-08 LAB — TSH: TSH: 0.86 m[IU]/L

## 2024-01-08 LAB — PROLACTIN: Prolactin: 17.4 ng/mL

## 2024-01-08 LAB — ACTH: C206 ACTH: 29 pg/mL (ref 6–50)

## 2024-01-08 LAB — T3, FREE: T3, Free: 2.6 pg/mL (ref 2.3–4.2)

## 2024-01-08 LAB — CORTISOL: Cortisol, Plasma: 16.4 ug/dL

## 2024-01-08 LAB — LUTEINIZING HORMONE: LH: 1.7 m[IU]/mL

## 2024-01-10 ENCOUNTER — Ambulatory Visit

## 2024-01-10 DIAGNOSIS — G8929 Other chronic pain: Secondary | ICD-10-CM

## 2024-01-10 DIAGNOSIS — M25562 Pain in left knee: Secondary | ICD-10-CM | POA: Diagnosis not present

## 2024-01-10 DIAGNOSIS — M25511 Pain in right shoulder: Secondary | ICD-10-CM | POA: Diagnosis not present

## 2024-01-10 DIAGNOSIS — R2681 Unsteadiness on feet: Secondary | ICD-10-CM | POA: Diagnosis not present

## 2024-01-10 NOTE — Therapy (Signed)
 OUTPATIENT PHYSICAL THERAPY NOTE   Patient Name: Beth [name] MRN: [id number] DOB:December 18, 1975, 48 y.o., female Today's Date: 01/10/2024  END OF SESSION:   PT End of Session - 01/10/24 1708     Visit Number 15    Number of Visits 19    Date for PT Re-Evaluation 01/19/24    Authorization Type BCBS    PT Start Time 1705    PT Stop Time 1745    PT Time Calculation (min) 40 min    Activity Tolerance Patient tolerated treatment well    Behavior During Therapy [facility] for tasks assessed/performed           PT End of Session - 01/10/24 1708     Visit Number 15    Number of Visits 19    Date for PT Re-Evaluation 01/19/24    Authorization Type BCBS    PT Start Time 1705    PT Stop Time 1745    PT Time Calculation (min) 40 min    Activity Tolerance Patient tolerated treatment well    Behavior During Therapy [facility] for tasks assessed/performed            PT End of Session - 01/10/24 1708     Visit Number 15    Number of Visits 19    Date for PT Re-Evaluation 01/19/24    Authorization Type BCBS    PT Start Time 1705    PT Stop Time 1745    PT Time Calculation (min) 40 min    Activity Tolerance Patient tolerated treatment well    Behavior During Therapy WFL for tasks assessed/performed              Past Medical History:  Diagnosis Date   Asthma    Chronic pancreatitis (HCC)    DDD (degenerative disc disease), lumbosacral    DJD (degenerative joint disease)    Headache    Hyperlipidemia    Hypertension    Hypoglycemia    Obesity    Osteoarthritis    Prolactinoma (HCC)    Sciatica    Sleep apnea    Past Surgical History:  Procedure Laterality Date   CHOLECYSTECTOMY     COLONOSCOPY     CRANIOTOMY N/A 01/04/2022   Procedure: Endonasal endoscopic resection of pituitary tumor;  Surgeon: Cheryle Debby LABOR, MD;  Location: [facility] OR;  Service: Neurosurgery;  Laterality: N/A;  RM 20   NASAL SINUS SURGERY N/A 01/04/2022   Procedure: ENDOSCOPIC SINUS SURGERY  TRANSNASAL PITUITARY REMOVAL WITH NASAL NASOSETTAL FLAP;  Surgeon: Llewellyn Gerard LABOR, DO;  Location: [facility] OR;  Service: ENT;  Laterality: N/A;   Patient Active Problem List   Diagnosis Date Noted   Class 3 severe obesity due to excess calories with body mass index (BMI) of 40.0 to 44.9 in adult 03/30/2023   Hypoglycemia 06/16/2022   Status post transsphenoidal pituitary resection (HCC) 01/04/2022   Low TSH level 10/21/2021   Dyslipidemia 07/15/2021   Osteoarthritis 08/20/2020   Sciatica 07/30/2020   DJD (degenerative joint disease)    Obstructive sleep apnea 03/11/2020   Snoring 02/05/2020   Pneumonia due to COVID-19 virus 12/15/2019   Acute respiratory failure with hypoxia (HCC) 12/15/2019   Hypokalemia 12/15/2019   Asthma without acute exacerbation 12/15/2019   Encounter for management of intrauterine contraceptive device (IUD) 06/09/2019   Essential hypertension 06/09/2019   Fatigue 06/09/2019   Slow transit constipation 06/09/2019   Hyperprolactinemia (HCC) 02/15/2018   Migraine 10/12/2017   Chronic pancreatitis (HCC) 11/09/2016  Prolactinoma (HCC) 11/09/2016   BMI 38.0-38.9,adult 11/09/2016   Candida rash of groin 11/09/2016   Mild intermittent asthma 11/09/2016   Mixed hyperlipidemia 11/09/2016    PCP: Celestia Rosaline SQUIBB, NP   REFERRING PROVIDER: Teressa Rainell BROCKS, DO  REFERRING DIAG: [phone] (ICD-10-CM) - Rotator cuff tendinitis, right M17.12 (ICD-10-CM) - Primary osteoarthritis of left knee  THERAPY DIAG:  Chronic pain of left knee  Chronic right shoulder pain  Unsteadiness on feet  Rationale for Evaluation and Treatment: Rehabilitation  ONSET DATE: Acute for shoulder, chronic for knee  SUBJECTIVE:                                                                                                                                                                                      SUBJECTIVE STATEMENT: Activity tolerances have improved.  Able to reach [facility] and behind  with less discomfort.  Standing and weightbearing tolerances also improved.     Hand dominance: Right  PERTINENT HISTORY: Also with right shoulder shoulder pain for the last 3 weeks.  No injury or trauma. She does note when she gets out of bed she typically pushes up on the right side No radiation of pain No numbness or tingling Previous shoulder issues for which she did physical therapy following COVID hospitalization in 2021 She has started doing some shoulder circles and wall crawls Evaluated at urgent care 10/05/23 -X-rays were unremarkable of the shoulder - Was given a sling - Rx ibuprofen  800 mg 3 times daily as needed.  This has been somewhat helpful - Recommended to follow-up with sports medicine  Patient with left knee pain since 04/2023 and right shoulder pain x 3 weeks Left medial knee pain without any specific injury or trauma Pain along the medial knee Some occasional clicking and popping Seen in urgent care 10/05/2023 - Urgent care notes reviewed in detail during the visit today - Fitted with a knee brace - X-ray showed mild osteoarthritis Given Rx ibuprofen  800 mg 3 times daily as needed which has been somewhat helpful  PAIN:  Are you having pain? Yes: NPRS scale: see subjective/10 Pain location: L knee Pain description: ache Aggravating factors: descending stairs, prolonged walking/sitting Relieving factors: heat, topical gels  PAIN:  Are you having pain? 12/08/23 - R shoulder: 2/10 currently, 1-10/10 in past week - L knee: 1/10 currently, 1-8/10  Prior to 12/08/23 visit: NPRS scale: 10/10 Pain location: R shoulder Pain description: ache, sharp Aggravating factors: OH motions, unexpected motion Relieving factors: massage, heat/ice   PRECAUTIONS: None  RED FLAGS: None   WEIGHT BEARING RESTRICTIONS: No  FALLS:  Has patient fallen in last 6 months? No  OCCUPATION: Office work  PLOF: Independent  PATIENT GOALS:To manage my shoulder and knee  pain  NEXT MD VISIT: 6 weeks  OBJECTIVE:  Note: Objective measures were completed at Evaluation unless otherwise noted.  DIAGNOSTIC FINDINGS:  Right shoulder x-ray 10/05/2023 at urgent care personally reviewed and interpreted by me showing: - Mild A/C joint degenerative changes - No other bony abnormalities   Left knee x-ray 10/05/2023 4 views AP/lateral/obliques personally reviewed and interpreted by me today showing: - Mild degenerative changes along the medial and patellofemoral compartments - No other bony abnormalities  PATIENT SURVEYS:  LEFS 16/80  12/08/23 LEFS 31/80  POSTURE: Rounded shoulders  UPPER EXTREMITY ROM:   A/PROM Right eval Left eval R 12/08/23  Shoulder flexion 90/160  A: 153 deg  Shoulder extension     Shoulder abduction 90/160    Shoulder adduction     Shoulder internal rotation     Shoulder external rotation     Elbow flexion     Elbow extension     Wrist flexion     Wrist extension     Wrist ulnar deviation     Wrist radial deviation     Wrist pronation     Wrist supination     (Blank rows = not tested)  UPPER EXTREMITY MMT:  MMT Right eval Left eval R/L 12/08/23    Shoulder flexion 4-  4+*/5  Shoulder extension     Shoulder abduction 4-  4+*/5  Shoulder adduction     Shoulder internal rotation     Shoulder external rotation     Middle trapezius     Lower trapezius     Elbow flexion     Elbow extension     Wrist flexion     Wrist extension     Wrist ulnar deviation     Wrist radial deviation     Wrist pronation     Wrist supination     Grip strength (lbs)     (Blank rows = not tested)  SHOULDER SPECIAL TESTS: Impingement tests: Neer impingement test: positive  and Hawkins/Kennedy impingement test: positive  Rotator cuff assessment: Drop arm test: negative, Empty can test: negative, and Hornblower's sign: negative   LOWER EXTREMITY MMT:    MMT Right eval Left eval R/L 12/08/23    Hip flexion     Hip extension     Hip  abduction     Hip adduction     Hip internal rotation     Hip external rotation     Knee flexion  4 5/5  Knee extension  4- 5/4*  Ankle dorsiflexion     Ankle plantarflexion     Ankle inversion     Ankle eversion      (Blank rows = not tested)   LOWER EXTREMITY ROM:     Active  Right eval Left eval L 12/08/23   Hip flexion     Hip extension     Hip abduction     Hip adduction     Hip internal rotation     Hip external rotation     Knee flexion 125 135 A: 130 deg *  Knee extension     Ankle dorsiflexion     Ankle plantarflexion     Ankle inversion     Ankle eversion      (Blank rows = not tested)   LOWER EXTREMITY SPECIAL TESTS:  Knee special tests: Lateral pull sign: negative and varus/valgus stress negative for laxity  PALPATION:  TTP R  biceps tendon, AC joint   Functional testing:  30s chair stand test 0 reps   12/08/23:  - 30secSTS 4 reps (beginning ascent for fifth rep at 30sec mark), moderate exertion and knee pain                                                                                                                           TREATMENT DATE:  [facility] Adult PT Treatment:                                                DATE: 01/10/24 Therapeutic Exercise: Nustep L6 8 min  Neuromuscular re-ed: Prone R shoulder extension 1000g ball 15x Prone R shoulder flexion 1000g ball 15x Prone R shoulder row 1000g ball 15x Prone R shoulder hor abd 1000g ball 15x Prone R shoulder scaption 1000g ball 10x Therapeutic Activity: Backward step downs 4 in 15x Heel raise from 4 in step 15x Sidestepping yellow band 2 trips   [facility] Adult PT Treatment:                                                DATE: 01/03/24 Therapeutic Exercise: Nustep L6 8 min Neuromuscular re-ed: Prone R shoulder extension 1000g ball 15x Prone R shoulder flexion 1000g ball 15x Prone R shoulder row 1000g ball 15x Prone R shoulder hor abd 1000g ball 15x Prone R shoulder scaption 1000g ball  10x Therapeutic Activity: Heel raise off of 4 in step Sidestepping against yellow band 2 trips Runners step 4 in 10/10 w/UE support TKEs BlaTB 10x 3 way pull  [facility] Adult PT Treatment:                                                DATE: 01/01/24 Therapeutic Exercise: Nustep L5 8 min Neuromuscular re-ed: Prone R shoulder extension 500g ball 10x Prone R shoulder flexion 500g ball 10x Prone R shoulder row 500g ball 15x Prone R shoulder hor abd 500g ball 10x Prone R shoulder scaption 500g ball 15x Therapeutic Activity: SLR L 15x 2# SAQs 5# 15x emphasis on eccentric motion TKEs BluTB 15x 3 way pull  [facility] Adult PT Treatment:                                                DATE: 12/27/23 Therapeutic Exercise: Nustep L4 6 min Neuromuscular re-ed: Supine press/protraction 1000 ball 15x Supine OH flexion 1000g ball 15x Rhythmic stab at  90d flexion 30s x3 Therapeutic Activity: Supine hor abd YTB 15x B, 15/15 unilaterally SLR L 15x SAQs 3# 15x emphasis on eccentric motion TKEs BluTB 15x 3 way pull   [facility] Adult PT Treatment:                                                DATE: 12/26/2023  Neuromuscular Re-ed:  Supine dowel AAROM flexion, 2 x 10  Supine dowel AAROM Abduction, poor tolerance today x 8  Supine shoulder flexion with resisted abduction 2 x 10  Supine shoulder flexion/abduction 2 x 8 PNF/Reversal of Antagonist  Therapeutic Activity:  UBE level 2 x 2.5 minute forward/2.5 minute backward Extra time discussing important patient goals for remaining POC   Resistance today provided by manual resistance from clinician unless otherwise noted     PATIENT EDUCATION: Education details: rationale for interventions, HEP, PT goals/POC Person educated: Patient Education method: Explanation, Demonstration, Tactile cues, Verbal cues Education comprehension: verbalized understanding, returned demonstration, verbal cues required, tactile cues required, and needs further education      HOME EXERCISE PROGRAM: Access Code: [email or web address] URL: [email or web address] Date: 12/25/2023 Prepared by: Marko Molt  Exercises - Standing Isometric Shoulder External Rotation with Doorway  - 2-3 x daily - 7 x weekly - 1 sets - 5 reps - Standing Isometric Shoulder Internal Rotation at Doorway  - 2-3 x daily - 7 x weekly - 1 sets - 5 reps - Standing 'L' Stretch at Counter  - 2-3 x daily - 7 x weekly - 1 sets - 5 reps - Seated Heel Toe Raises  - 2-3 x daily - 7 x weekly - 1 sets - 10 reps - Seated Long Arc Quad  - 2-3 x daily - 7 x weekly - 1 sets - 10 reps - Horizontal Shoulder Pendulum with Table Support  - 2 x daily - 7 x weekly - 30 sec hold - Circular Shoulder Pendulum with Table Support  - 2 x daily - 7 x weekly - 30 sec hold  ASSESSMENT:   CLINICAL IMPRESSION:  No change to shoulder weight/resistance to allow patient to progress to full ROM with prone shoulder exercises.  Advanced to eccentric CKC exercises to strengthen L knee in TKE.  Added sidestepping against resistance foe hip strength/stability training.   RECERT 12/08/2023: Pt arrives w/ report of 1-2/10 pain for shoulder/knee, improvement since fall but symptoms remain exacerbated; received MRI results which were reassuring, refer to [facility] for details. Looking at goals, pt demonstrates notable improvements in MMT/ROM and 30sec STS despite symptom exacerbation post fall, although deficits do persist and she has not yet fully met LTG. Continues to require rest breaks to maximize tolerance. HEP update today with emphasis on strategies to mitigate stiffness with prolonged sitting. Recommend extension of POC to address relevant deficits, minimize pain, and maximize functional tolerance. No adverse events, pt reports some UT soreness on departure but otherwise no change in symptoms. Pt departs today's session in no acute distress, all voiced questions/concerns addressed appropriately from PT perspective.       OBJECTIVE  IMPAIRMENTS: Abnormal gait, decreased activity tolerance, decreased endurance, decreased knowledge of condition, decreased knowledge of use of DME, decreased mobility, difficulty walking, decreased ROM, decreased strength, impaired perceived functional ability, impaired UE functional use, postural dysfunction, obesity, and pain.   ACTIVITY LIMITATIONS: carrying, lifting, sitting, standing, squatting, stairs, dressing,  reach over head, and locomotion level  PERSONAL FACTORS: Age, Behavior pattern, Fitness, Past/current experiences, Time since onset of injury/illness/exacerbation, and 1 comorbidity: Hx of long Covid are also affecting patient's functional outcome.   REHAB POTENTIAL: Good  CLINICAL DECISION MAKING: Stable/uncomplicated  EVALUATION COMPLEXITY: Moderate   GOALS: Goals reviewed with patient? No  SHORT TERM GOALS: Target date: 11/14/2023    Patient to demonstrate independence in HEP  Baseline: TBD due to time constraints 11/16/23: reports good HEP adherence Goal status: MET  2.  Patient will increase AROM R shoulder to 160d flexion an abduction Baseline:  A/PROM Right eval Left eval  Shoulder flexion 90/160   Shoulder extension    Shoulder abduction 90/160    11/16/23: gross observation ~140 deg AAROM against gravity 12/08/23: 153 deg Goal status: PROGRESSING  3.  Increase L knee flexion to 130d Baseline: 125d 12/08/23: 130 deg painful Goal status: MET    LONG TERM GOALS: Target date: 01/19/2024  (Updated 12/08/23)    Patient will acknowledge 6/10 pain at least once during episode of care  in L knee and R shoulder Baseline: 10/10 12/08/23:  R shoulder: 2/10 currently, 1-10/10 in past week; L knee: 1/10 currently, 1-8/10 Goal status: MET  2.  Patient will score at least 45/80 on LEFS to signify clinically meaningful improvement in functional abilities.   Baseline: 16/80 12/08/23: 31/80 (meeting initial goal) Goal status: NEW/UPDATED 12/08/23  3.  Patient will  increase 30s chair stand reps from 0 to 5 without arms to demonstrate and improved functional ability with less pain/difficulty as well as reduce fall risk.  Baseline:  12/08/23: 4 reps w/ exertion/pain Goal status: PROGRESSING  4.  Increase R shoulder strength to 4/5 Baseline:  MMT Right eval Left eval  Shoulder flexion 4-   Shoulder extension    Shoulder abduction 4-    12/08/23: see MMT chart above Goal status: MET  5.  Increase L knee strength to 4+/5 Baseline:  MMT Right eval Left eval  Hip flexion    Hip extension    Hip abduction    Hip adduction    Hip internal rotation    Hip external rotation    Knee flexion  4  Knee extension  4-   12/08/23: see MMT chart above Goal status: PROGRESSING    PLAN: (updated 12/08/23)  PT FREQUENCY: 1-2x/week  PT DURATION: 6 weeks  PLANNED INTERVENTIONS: 97110-Therapeutic exercises, 97530- Therapeutic activity, 97112- Neuromuscular re-education, 97535- Self Care, 02859- Manual therapy, [phone]- Gait training, and Patient/Family education  PLAN FOR NEXT SESSION: HEP review and update, manual techniques as appropriate, aerobic tasks, ROM and flexibility activities, strengthening and PREs, TPDN, gait and balance training as needed    Jeff Donye Dauenhauer PT  01/10/2024

## 2024-01-10 NOTE — Patient Instructions (Signed)
 Visit Information  Thank you for taking time to visit with me today. Please don't hesitate to contact me if I can be of assistance to you before our next scheduled appointment.  Your next care management appointment is by telephone on 8/8 at 3 PM  Please call the care guide team at [phone] if you need to cancel, schedule, or reschedule an appointment.   Please call the Suicide and Crisis Lifeline: 988 go to [facility] Urgent [facility] [address], [place] [phone]) call 911 if you are experiencing a Mental Health or Behavioral Health Crisis or need someone to talk to.  Rolin Kerns, LCSW [place]  [facility], [facility] Clinical Social Worker Direct Dial: [phone]  Fax: [phone] Website: [email or web address] 12:03 PM

## 2024-01-10 NOTE — Patient Outreach (Signed)
 Complex Care Management   Visit Note  01/04/2024  Name:  Beth [name] MRN: [id number] DOB: 29-Apr-1976  Situation: Referral received for Complex Care Management related to Mental/Behavioral Health diagnosis Depression I obtained verbal consent from Patient.  Visit completed with pt  on the phone  Background:   Past Medical History:  Diagnosis Date   Asthma    Chronic pancreatitis (HCC)    DDD (degenerative disc disease), lumbosacral    DJD (degenerative joint disease)    Headache    Hyperlipidemia    Hypertension    Hypoglycemia    Obesity    Osteoarthritis    Prolactinoma (HCC)    Sciatica    Sleep apnea     Assessment: Patient Reported Symptoms:  Cognitive Cognitive Status: Alert and oriented to person, place, and time, Normal speech and language skills Cognitive/Intellectual Conditions Management [RPT]: None reported or documented in medical history or problem list      Neurological Neurological Review of Symptoms: No symptoms reported    HEENT HEENT Symptoms Reported: Not assessed      Cardiovascular Cardiovascular Symptoms Reported: Not assessed    Respiratory Respiratory Symptoms Reported: Not assesed    Endocrine Endocrine Symptoms Reported: Not assessed    Gastrointestinal Gastrointestinal Symptoms Reported: Not assessed      Genitourinary Genitourinary Symptoms Reported: Not assessed    Integumentary Integumentary Symptoms Reported: Not assessed    Musculoskeletal Musculoskelatal Symptoms Reviewed: Unsteady gait Additional Musculoskeletal Details: Patient reports things are progressing with knee and shoulder with continued participation in PT. Continues to have pain due to arthritis and sciatica Musculoskeletal Management Strategies: Coping strategies, Exercise, Routine screening      Psychosocial Psychosocial Symptoms Reported: Other Other Psychosocial Conditions: Stress Additional Psychological Details: Pt reports depression symptoms have  decreased. She practices reflection and has identified progress towards goals. LCSW discussed strategies to implement self-care with pt Behavioral Management Strategies: Adequate rest, Coping strategies, Support system          12/21/2023   11:31 AM  Depression screen PHQ 2/9  Decreased Interest 2  Down, Depressed, Hopeless 1  PHQ - 2 Score 3  Altered sleeping 1  Tired, decreased energy 1  Change in appetite 3  Feeling bad or failure about yourself  1  Trouble concentrating 1  Moving slowly or fidgety/restless 0  Suicidal thoughts 0  PHQ-9 Score 10    There were no vitals filed for this visit.  Medications Reviewed Today     Reviewed by Yaslin Kirtley D, LCSW (Social Worker) on 01/10/24 at 1125  Med List Status: <None>   Medication Order Taking? Sig Documenting Provider Last Dose Status Informant  albuterol  (VENTOLIN  HFA) 108 (90 Base) MCG/ACT inhaler [id number]  Inhale 2 puffs into the lungs every 6 (six) hours as needed for wheezing or shortness of breath. Shelah Lamar RAMAN, MD  Active   Ascorbic Acid  (VITAMIN C) 1000 MG tablet [id number]  Take 1,000 mg by mouth daily. [provider]  Active Self  benzonatate  (TESSALON ) 200 MG capsule [id number]  Take 1 capsule (200 mg total) by mouth 3 (three) times daily as needed for cough. Byrum, Robert S, MD  Active   Butenafine  HCl (MENTAX) 1 % cream [id number]  Apply 1 application topically 2 (two) times daily. Celestia Rosaline SQUIBB, NP  Active Self  Camphor-Menthol-Methyl Sal ALOHA BALM MUSCLE RUB EX) [id number]  Apply 1 oz topically as needed (patient uses asa needed). [provider]  Active   Cholecalciferol (VITAMIN D3 PO) [id number]  Take 2,000 mg by mouth daily. On Hold [provider]  Active Self  Docusate Sodium  (COLACE PO) [id number]  Take 1 capsule by mouth daily. [provider]  Active Self  EPINEPHrine  0.3 mg/0.3 mL IJ SOAJ injection [id number]  Inject 0.3 mg into the muscle as needed for  anaphylaxis. [provider]  Active Self           Med Note SOILA LYLE JAYSON Pablo Dec 26, 2021  9:08 AM) Pt needs a refill, current pen is expired  fenofibrate  (TRICOR ) 145 MG tablet [id number]  Take 1 tablet (145 mg total) by mouth daily. Celestia Rosaline SQUIBB, NP  Active   fluticasone  (FLONASE ) 50 MCG/ACT nasal spray [id number]  Place 2 sprays into both nostrils daily. Byrum, Robert S, MD  Active Self  ipratropium-albuterol  (DUONEB) 0.5-2.5 (3) MG/3ML SOLN [id number]  Take 3 mLs by nebulization every 6 (six) hours as needed (shortness of breath, wheezing, and persistent cough). Arloa Suzen RAMAN, NP  Active   levonorgestrel  (MIRENA ) 20 MCG/24HR IUD [id number]  1 each by Intrauterine route once.  [provider]  Active Self  LORazepam  (ATIVAN ) 1 MG tablet [id number]  Take 1 tablet (1 mg total) by mouth every 8 (eight) hours as needed for anxiety (mri). Vaslow, Zachary K, MD  Active   Menthol, Topical Analgesic, (BIOFREEZE) 10 % CREA [id number]  Apply 1 Application topically daily as needed (pain). [provider]  Active Self  ondansetron  (ZOFRAN -ODT) 8 MG disintegrating tablet [id number]  Take 1 tablet (8 mg total) by mouth every 8 (eight) hours as needed for nausea or vomiting. Izell Domino, MD  Active   Pancrelipase , Lip-Prot-Amyl, (CREON ) 24000-76000 units CPEP [id number]  Take 2 capsules by mouth See admin instructions. Take 2 capsules three times daily with food and 1 capsule twice daily with snacks. [provider]  Active Self  pantoprazole  (PROTONIX ) 20 MG tablet [id number]  Take 20 mg by mouth daily. [provider]  Active Self  promethazine -dextromethorphan  (PROMETHAZINE -DM) 6.25-15 MG/5ML syrup [id number]  Take 5 mLs by mouth 3 (three) times daily as needed for cough. Arloa Suzen RAMAN, NP  Active   psyllium (REGULOID) 0.52 g capsule [id number]  Take 0.52 capsules by mouth as needed. [provider]  Active Self  rosuvastatin  (CRESTOR ) 40 MG  tablet [id number]  Take 1 tablet (40 mg total) by mouth daily. Celestia Rosaline SQUIBB, NP  Active   Zinc  Sulfate (ZINC  15 PO) [id number]  Take 15 mg by mouth daily. [provider]  Active Self            Recommendation:   Continue Current Plan of Care  Follow Up Plan:   Telephone follow-up in 1 month  Rolin Kerns, LCSW [facility]  [facility], [facility] Clinical Social Worker Direct Dial: [phone]  Fax: [phone] Website: [email or web address] 12:02 PM

## 2024-01-11 DIAGNOSIS — G4733 Obstructive sleep apnea (adult) (pediatric): Secondary | ICD-10-CM | POA: Diagnosis not present

## 2024-01-12 ENCOUNTER — Ambulatory Visit

## 2024-01-12 DIAGNOSIS — G8929 Other chronic pain: Secondary | ICD-10-CM | POA: Diagnosis not present

## 2024-01-12 DIAGNOSIS — R2681 Unsteadiness on feet: Secondary | ICD-10-CM

## 2024-01-12 DIAGNOSIS — M25562 Pain in left knee: Secondary | ICD-10-CM | POA: Diagnosis not present

## 2024-01-12 DIAGNOSIS — M25511 Pain in right shoulder: Secondary | ICD-10-CM | POA: Diagnosis not present

## 2024-01-12 NOTE — Therapy (Signed)
 OUTPATIENT PHYSICAL THERAPY NOTE   Patient Name: Beth [name] MRN: [id number] DOB:Dec 23, 1975, 48 y.o., female Today's Date: 01/12/2024  END OF SESSION:  PT End of Session - 01/12/24 0812     Visit Number 16    Number of Visits 19    Date for PT Re-Evaluation 01/19/24    Authorization Type BCBS    PT Start Time 0815    PT Stop Time 0855    PT Time Calculation (min) 40 min    Activity Tolerance Patient tolerated treatment well    Behavior During Therapy [facility] for tasks assessed/performed          PT End of Session - 01/12/24 0812     Visit Number 16    Number of Visits 19    Date for PT Re-Evaluation 01/19/24    Authorization Type BCBS    PT Start Time 0815    PT Stop Time 0855    PT Time Calculation (min) 40 min    Activity Tolerance Patient tolerated treatment well    Behavior During Therapy [facility] for tasks assessed/performed             PT End of Session - 01/12/24 0812     Visit Number 16    Number of Visits 19    Date for PT Re-Evaluation 01/19/24    Authorization Type BCBS    PT Start Time 0815    PT Stop Time 0855    PT Time Calculation (min) 40 min    Activity Tolerance Patient tolerated treatment well    Behavior During Therapy WFL for tasks assessed/performed         Past Medical History:  Diagnosis Date   Asthma    Chronic pancreatitis (HCC)    DDD (degenerative disc disease), lumbosacral    DJD (degenerative joint disease)    Headache    Hyperlipidemia    Hypertension    Hypoglycemia    Obesity    Osteoarthritis    Prolactinoma (HCC)    Sciatica    Sleep apnea    Past Surgical History:  Procedure Laterality Date   CHOLECYSTECTOMY     COLONOSCOPY     CRANIOTOMY N/A 01/04/2022   Procedure: Endonasal endoscopic resection of pituitary tumor;  Surgeon: Cheryle Debby LABOR, MD;  Location: [facility] OR;  Service: Neurosurgery;  Laterality: N/A;  RM 20   NASAL SINUS SURGERY N/A 01/04/2022   Procedure: ENDOSCOPIC SINUS SURGERY TRANSNASAL  PITUITARY REMOVAL WITH NASAL NASOSETTAL FLAP;  Surgeon: Llewellyn Gerard LABOR, DO;  Location: [facility] OR;  Service: ENT;  Laterality: N/A;   Patient Active Problem List   Diagnosis Date Noted   Class 3 severe obesity due to excess calories with body mass index (BMI) of 40.0 to 44.9 in adult 03/30/2023   Hypoglycemia 06/16/2022   Status post transsphenoidal pituitary resection (HCC) 01/04/2022   Low TSH level 10/21/2021   Dyslipidemia 07/15/2021   Osteoarthritis 08/20/2020   Sciatica 07/30/2020   DJD (degenerative joint disease)    Obstructive sleep apnea 03/11/2020   Snoring 02/05/2020   Pneumonia due to COVID-19 virus 12/15/2019   Acute respiratory failure with hypoxia (HCC) 12/15/2019   Hypokalemia 12/15/2019   Asthma without acute exacerbation 12/15/2019   Encounter for management of intrauterine contraceptive device (IUD) 06/09/2019   Essential hypertension 06/09/2019   Fatigue 06/09/2019   Slow transit constipation 06/09/2019   Hyperprolactinemia (HCC) 02/15/2018   Migraine 10/12/2017   Chronic pancreatitis (HCC) 11/09/2016   Prolactinoma (HCC) 11/09/2016   BMI  38.0-38.9,adult 11/09/2016   Candida rash of groin 11/09/2016   Mild intermittent asthma 11/09/2016   Mixed hyperlipidemia 11/09/2016    PCP: Celestia Rosaline SQUIBB, NP   REFERRING PROVIDER: Teressa Rainell BROCKS, DO  REFERRING DIAG: [phone] (ICD-10-CM) - Rotator cuff tendinitis, right M17.12 (ICD-10-CM) - Primary osteoarthritis of left knee  THERAPY DIAG:  Chronic pain of left knee  Chronic right shoulder pain  Unsteadiness on feet  Rationale for Evaluation and Treatment: Rehabilitation  ONSET DATE: Acute for shoulder, chronic for knee  SUBJECTIVE:                                                                                                                                                                                      SUBJECTIVE STATEMENT:  Patient reports some soreness from previous session, especially in her  calves. She states her knee pain continues and is tender and that her shoulder is stiff this morning.  Hand dominance: Right  PERTINENT HISTORY: Also with right shoulder shoulder pain for the last 3 weeks.  No injury or trauma. She does note when she gets out of bed she typically pushes up on the right side No radiation of pain No numbness or tingling Previous shoulder issues for which she did physical therapy following COVID hospitalization in 2021 She has started doing some shoulder circles and wall crawls Evaluated at urgent care 10/05/23 -X-rays were unremarkable of the shoulder - Was given a sling - Rx ibuprofen  800 mg 3 times daily as needed.  This has been somewhat helpful - Recommended to follow-up with sports medicine  Patient with left knee pain since 04/2023 and right shoulder pain x 3 weeks Left medial knee pain without any specific injury or trauma Pain along the medial knee Some occasional clicking and popping Seen in urgent care 10/05/2023 - Urgent care notes reviewed in detail during the visit today - Fitted with a knee brace - X-ray showed mild osteoarthritis Given Rx ibuprofen  800 mg 3 times daily as needed which has been somewhat helpful  PAIN:  Are you having pain? Yes: NPRS scale: see subjective/10 Pain location: L knee Pain description: ache Aggravating factors: descending stairs, prolonged walking/sitting Relieving factors: heat, topical gels  PAIN:  Are you having pain? 12/08/23 - R shoulder: 2/10 currently, 1-10/10 in past week - L knee: 1/10 currently, 1-8/10  Prior to 12/08/23 visit: NPRS scale: 10/10 Pain location: R shoulder Pain description: ache, sharp Aggravating factors: OH motions, unexpected motion Relieving factors: massage, heat/ice   PRECAUTIONS: None  RED FLAGS: None   WEIGHT BEARING RESTRICTIONS: No  FALLS:  Has patient fallen in last 6 months? No  OCCUPATION: Office work  PLOF: Independent  PATIENT GOALS:To manage my  shoulder and knee pain  NEXT MD VISIT: 6 weeks  OBJECTIVE:  Note: Objective measures were completed at Evaluation unless otherwise noted.  DIAGNOSTIC FINDINGS:  Right shoulder x-ray 10/05/2023 at urgent care personally reviewed and interpreted by me showing: - Mild A/C joint degenerative changes - No other bony abnormalities   Left knee x-ray 10/05/2023 4 views AP/lateral/obliques personally reviewed and interpreted by me today showing: - Mild degenerative changes along the medial and patellofemoral compartments - No other bony abnormalities  PATIENT SURVEYS:  LEFS 16/80  12/08/23 LEFS 31/80  POSTURE: Rounded shoulders  UPPER EXTREMITY ROM:   A/PROM Right eval Left eval R 12/08/23  Shoulder flexion 90/160  A: 153 deg  Shoulder extension     Shoulder abduction 90/160    Shoulder adduction     Shoulder internal rotation     Shoulder external rotation     Elbow flexion     Elbow extension     Wrist flexion     Wrist extension     Wrist ulnar deviation     Wrist radial deviation     Wrist pronation     Wrist supination     (Blank rows = not tested)  UPPER EXTREMITY MMT:  MMT Right eval Left eval R/L 12/08/23    Shoulder flexion 4-  4+*/5  Shoulder extension     Shoulder abduction 4-  4+*/5  Shoulder adduction     Shoulder internal rotation     Shoulder external rotation     Middle trapezius     Lower trapezius     Elbow flexion     Elbow extension     Wrist flexion     Wrist extension     Wrist ulnar deviation     Wrist radial deviation     Wrist pronation     Wrist supination     Grip strength (lbs)     (Blank rows = not tested)  SHOULDER SPECIAL TESTS: Impingement tests: Neer impingement test: positive  and Hawkins/Kennedy impingement test: positive  Rotator cuff assessment: Drop arm test: negative, Empty can test: negative, and Hornblower's sign: negative   LOWER EXTREMITY MMT:    MMT Right eval Left eval R/L 12/08/23    Hip flexion     Hip  extension     Hip abduction     Hip adduction     Hip internal rotation     Hip external rotation     Knee flexion  4 5/5  Knee extension  4- 5/4*  Ankle dorsiflexion     Ankle plantarflexion     Ankle inversion     Ankle eversion      (Blank rows = not tested)   LOWER EXTREMITY ROM:     Active  Right eval Left eval L 12/08/23   Hip flexion     Hip extension     Hip abduction     Hip adduction     Hip internal rotation     Hip external rotation     Knee flexion 125 135 A: 130 deg *  Knee extension     Ankle dorsiflexion     Ankle plantarflexion     Ankle inversion     Ankle eversion      (Blank rows = not tested)   LOWER EXTREMITY SPECIAL TESTS:  Knee special tests: Lateral pull sign: negative and varus/valgus stress negative for laxity  PALPATION:  TTP R  biceps tendon, AC joint   Functional testing:  30s chair stand test 0 reps   12/08/23:  - 30secSTS 4 reps (beginning ascent for fifth rep at 30sec mark), moderate exertion and knee pain                                                                                                                           TREATMENT DATE:  [facility] Adult PT Treatment:                                                DATE: 01/12/24 Therapeutic Exercise: Nustep L6 8 min Slant board calf stretch x1' Neuromuscular re-ed: Prone R shoulder extension 1000g ball 15x Prone R shoulder flexion 1000g ball 15x Prone R shoulder row 1000g ball 15x Prone R shoulder hor abd 1000 ball 15x Prone R shoulder scaption 1000g ball 15x Therapeutic Activity: Backward step downs 4 in 15x Heel raise from 4 in step x10 Hurdle step overs x4 hurdles at counter fwd/ step to and step over step pattern    [facility] Adult PT Treatment:                                                DATE: 01/10/24 Therapeutic Exercise: Nustep L6 8 min  Neuromuscular re-ed: Prone R shoulder extension 1000g ball 15x Prone R shoulder flexion 1000g ball 15x Prone R shoulder row 1000g  ball 15x Prone R shoulder hor abd 1000g ball 15x Prone R shoulder scaption 1000g ball 10x Therapeutic Activity: Backward step downs 4 in 15x Heel raise from 4 in step 15x Sidestepping yellow band 2 trips   [facility] Adult PT Treatment:                                                DATE: 01/03/24 Therapeutic Exercise: Nustep L6 8 min Neuromuscular re-ed: Prone R shoulder extension 1000g ball 15x Prone R shoulder flexion 1000g ball 15x Prone R shoulder row 1000g ball 15x Prone R shoulder hor abd 1000g ball 15x Prone R shoulder scaption 1000g ball 10x Therapeutic Activity: Heel raise off of 4 in step Sidestepping against yellow band 2 trips Runners step 4 in 10/10 w/UE support TKEs BlaTB 10x 3 way pull    PATIENT EDUCATION: Education details: rationale for interventions, HEP, PT goals/POC Person educated: Patient Education method: Explanation, Demonstration, Tactile cues, Verbal cues Education comprehension: verbalized understanding, returned demonstration, verbal cues required, tactile cues required, and needs further education     HOME EXERCISE PROGRAM: Access Code: [email or web address] URL: [email or web address] Date: 12/25/2023 Prepared by: Marko Molt  Exercises - Standing Isometric Shoulder External Rotation with Doorway  - 2-3 x daily - 7 x weekly - 1 sets - 5 reps - Standing Isometric Shoulder Internal Rotation at Doorway  - 2-3 x daily - 7 x weekly - 1 sets - 5 reps - Standing 'L' Stretch at Counter  - 2-3 x daily - 7 x weekly - 1 sets - 5 reps - Seated Heel Toe Raises  - 2-3 x daily - 7 x weekly - 1 sets - 10 reps - Seated Long Arc Quad  - 2-3 x daily - 7 x weekly - 1 sets - 10 reps - Horizontal Shoulder Pendulum with Table Support  - 2 x daily - 7 x weekly - 30 sec hold - Circular Shoulder Pendulum with Table Support  - 2 x daily - 7 x weekly - 30 sec hold  ASSESSMENT:   CLINICAL IMPRESSION:   Patient presents to PT reporting soreness in her calves, pain in  her Lt knee, and stiffness in her Rt shoulder. Session today continued to focus on LE, shoulder, and periscapular strengthening. Additional stretches added today to mitigate increased soreness from previous session. She mentions difficulty with stepping over objects, added step over with hurdles today. Patient was able to tolerate all prescribed exercises with no adverse effects. Patient continues to benefit from skilled PT services and should be progressed as able to improve functional independence.    RECERT 12/08/2023: Pt arrives w/ report of 1-2/10 pain for shoulder/knee, improvement since fall but symptoms remain exacerbated; received MRI results which were reassuring, refer to [facility] for details. Looking at goals, pt demonstrates notable improvements in MMT/ROM and 30sec STS despite symptom exacerbation post fall, although deficits do persist and she has not yet fully met LTG. Continues to require rest breaks to maximize tolerance. HEP update today with emphasis on strategies to mitigate stiffness with prolonged sitting. Recommend extension of POC to address relevant deficits, minimize pain, and maximize functional tolerance. No adverse events, pt reports some UT soreness on departure but otherwise no change in symptoms. Pt departs today's session in no acute distress, all voiced questions/concerns addressed appropriately from PT perspective.       OBJECTIVE IMPAIRMENTS: Abnormal gait, decreased activity tolerance, decreased endurance, decreased knowledge of condition, decreased knowledge of use of DME, decreased mobility, difficulty walking, decreased ROM, decreased strength, impaired perceived functional ability, impaired UE functional use, postural dysfunction, obesity, and pain.   ACTIVITY LIMITATIONS: carrying, lifting, sitting, standing, squatting, stairs, dressing, reach over head, and locomotion level  PERSONAL FACTORS: Age, Behavior pattern, Fitness, Past/current experiences, Time since onset  of injury/illness/exacerbation, and 1 comorbidity: Hx of long Covid are also affecting patient's functional outcome.   REHAB POTENTIAL: Good  CLINICAL DECISION MAKING: Stable/uncomplicated  EVALUATION COMPLEXITY: Moderate   GOALS: Goals reviewed with patient? No  SHORT TERM GOALS: Target date: 11/14/2023    Patient to demonstrate independence in HEP  Baseline: TBD due to time constraints 11/16/23: reports good HEP adherence Goal status: MET  2.  Patient will increase AROM R shoulder to 160d flexion an abduction Baseline:  A/PROM Right eval Left eval  Shoulder flexion 90/160   Shoulder extension    Shoulder abduction 90/160    11/16/23: gross observation ~140 deg AAROM against gravity 12/08/23: 153 deg Goal status: PROGRESSING  3.  Increase L knee flexion to 130d Baseline: 125d 12/08/23: 130 deg painful Goal status: MET    LONG TERM GOALS: Target date: 01/19/2024  (Updated 12/08/23)    Patient  will acknowledge 6/10 pain at least once during episode of care  in L knee and R shoulder Baseline: 10/10 12/08/23:  R shoulder: 2/10 currently, 1-10/10 in past week; L knee: 1/10 currently, 1-8/10 Goal status: MET  2.  Patient will score at least 45/80 on LEFS to signify clinically meaningful improvement in functional abilities.   Baseline: 16/80 12/08/23: 31/80 (meeting initial goal) Goal status: NEW/UPDATED 12/08/23  3.  Patient will increase 30s chair stand reps from 0 to 5 without arms to demonstrate and improved functional ability with less pain/difficulty as well as reduce fall risk.  Baseline:  12/08/23: 4 reps w/ exertion/pain Goal status: PROGRESSING  4.  Increase R shoulder strength to 4/5 Baseline:  MMT Right eval Left eval  Shoulder flexion 4-   Shoulder extension    Shoulder abduction 4-    12/08/23: see MMT chart above Goal status: MET  5.  Increase L knee strength to 4+/5 Baseline:  MMT Right eval Left eval  Hip flexion    Hip extension    Hip  abduction    Hip adduction    Hip internal rotation    Hip external rotation    Knee flexion  4  Knee extension  4-   12/08/23: see MMT chart above Goal status: PROGRESSING    PLAN: (updated 12/08/23)  PT FREQUENCY: 1-2x/week  PT DURATION: 6 weeks  PLANNED INTERVENTIONS: 97110-Therapeutic exercises, 97530- Therapeutic activity, 97112- Neuromuscular re-education, 97535- Self Care, [id number]- Manual therapy, [phone]- Gait training, and Patient/Family education  PLAN FOR NEXT SESSION: HEP review and update, manual techniques as appropriate, aerobic tasks, ROM and flexibility activities, strengthening and PREs, TPDN, gait and balance training as needed     Corean Pouch PTA  01/12/2024

## 2024-01-15 ENCOUNTER — Ambulatory Visit

## 2024-01-15 DIAGNOSIS — M25562 Pain in left knee: Secondary | ICD-10-CM | POA: Diagnosis not present

## 2024-01-15 DIAGNOSIS — G8929 Other chronic pain: Secondary | ICD-10-CM

## 2024-01-15 DIAGNOSIS — R2681 Unsteadiness on feet: Secondary | ICD-10-CM | POA: Diagnosis not present

## 2024-01-15 DIAGNOSIS — M25511 Pain in right shoulder: Secondary | ICD-10-CM | POA: Diagnosis not present

## 2024-01-15 NOTE — Therapy (Unsigned)
 OUTPATIENT PHYSICAL THERAPY NOTE   Patient Name: Beth [name] MRN: [id number] DOB:Jun 08, 1976, 48 y.o., female Today's Date: 01/15/2024  END OF SESSION:          Past Medical History:  Diagnosis Date   Asthma    Chronic pancreatitis (HCC)    DDD (degenerative disc disease), lumbosacral    DJD (degenerative joint disease)    Headache    Hyperlipidemia    Hypertension    Hypoglycemia    Obesity    Osteoarthritis    Prolactinoma (HCC)    Sciatica    Sleep apnea    Past Surgical History:  Procedure Laterality Date   CHOLECYSTECTOMY     COLONOSCOPY     CRANIOTOMY N/A 01/04/2022   Procedure: Endonasal endoscopic resection of pituitary tumor;  Surgeon: Cheryle Debby LABOR, MD;  Location: [facility] OR;  Service: Neurosurgery;  Laterality: N/A;  RM 20   NASAL SINUS SURGERY N/A 01/04/2022   Procedure: ENDOSCOPIC SINUS SURGERY TRANSNASAL PITUITARY REMOVAL WITH NASAL NASOSETTAL FLAP;  Surgeon: Llewellyn Gerard LABOR, DO;  Location: [facility] OR;  Service: ENT;  Laterality: N/A;   Patient Active Problem List   Diagnosis Date Noted   Class 3 severe obesity due to excess calories with body mass index (BMI) of 40.0 to 44.9 in adult 03/30/2023   Hypoglycemia 06/16/2022   Status post transsphenoidal pituitary resection (HCC) 01/04/2022   Low TSH level 10/21/2021   Dyslipidemia 07/15/2021   Osteoarthritis 08/20/2020   Sciatica 07/30/2020   DJD (degenerative joint disease)    Obstructive sleep apnea 03/11/2020   Snoring 02/05/2020   Pneumonia due to COVID-19 virus 12/15/2019   Acute respiratory failure with hypoxia (HCC) 12/15/2019   Hypokalemia 12/15/2019   Asthma without acute exacerbation 12/15/2019   Encounter for management of intrauterine contraceptive device (IUD) 06/09/2019   Essential hypertension 06/09/2019   Fatigue 06/09/2019   Slow transit constipation 06/09/2019   Hyperprolactinemia (HCC) 02/15/2018   Migraine 10/12/2017   Chronic pancreatitis (HCC) 11/09/2016    Prolactinoma (HCC) 11/09/2016   BMI 38.0-38.9,adult 11/09/2016   Candida rash of groin 11/09/2016   Mild intermittent asthma 11/09/2016   Mixed hyperlipidemia 11/09/2016    PCP: Celestia Rosaline SQUIBB, NP   REFERRING PROVIDER: Teressa Rainell BROCKS, DO  REFERRING DIAG: [id number] (ICD-10-CM) - Rotator cuff tendinitis, right M17.12 (ICD-10-CM) - Primary osteoarthritis of left knee  THERAPY DIAG:  No diagnosis found.  Rationale for Evaluation and Treatment: Rehabilitation  ONSET DATE: Acute for shoulder, chronic for knee  SUBJECTIVE:  SUBJECTIVE STATEMENT:  Patient reports some soreness from previous session, especially in her calves. She states her knee pain continues and is tender and that her shoulder is stiff this morning.  Hand dominance: Right  PERTINENT HISTORY: Also with right shoulder shoulder pain for the last 3 weeks.  No injury or trauma. She does note when she gets out of bed she typically pushes up on the right side No radiation of pain No numbness or tingling Previous shoulder issues for which she did physical therapy following COVID hospitalization in 2021 She has started doing some shoulder circles and wall crawls Evaluated at urgent care 10/05/23 -X-rays were unremarkable of the shoulder - Was given a sling - Rx ibuprofen  800 mg 3 times daily as needed.  This has been somewhat helpful - Recommended to follow-up with sports medicine  Patient with left knee pain since 04/2023 and right shoulder pain x 3 weeks Left medial knee pain without any specific injury or trauma Pain along the medial knee Some occasional clicking and popping Seen in urgent care 10/05/2023 - Urgent care notes reviewed in detail during the visit today - Fitted with a knee brace - X-ray showed mild osteoarthritis Given Rx  ibuprofen  800 mg 3 times daily as needed which has been somewhat helpful  PAIN:  Are you having pain? Yes: NPRS scale: see subjective/10 Pain location: L knee Pain description: ache Aggravating factors: descending stairs, prolonged walking/sitting Relieving factors: heat, topical gels  PAIN:  Are you having pain? 12/08/23 - R shoulder: 2/10 currently, 1-10/10 in past week - L knee: 1/10 currently, 1-8/10  Prior to 12/08/23 visit: NPRS scale: 10/10 Pain location: R shoulder Pain description: ache, sharp Aggravating factors: OH motions, unexpected motion Relieving factors: massage, heat/ice   PRECAUTIONS: None  RED FLAGS: None   WEIGHT BEARING RESTRICTIONS: No  FALLS:  Has patient fallen in last 6 months? No  OCCUPATION: Office work  PLOF: Independent  PATIENT GOALS:To manage my shoulder and knee pain  NEXT MD VISIT: 6 weeks  OBJECTIVE:  Note: Objective measures were completed at Evaluation unless otherwise noted.  DIAGNOSTIC FINDINGS:  Right shoulder x-ray 10/05/2023 at urgent care personally reviewed and interpreted by me showing: - Mild A/C joint degenerative changes - No other bony abnormalities   Left knee x-ray 10/05/2023 4 views AP/lateral/obliques personally reviewed and interpreted by me today showing: - Mild degenerative changes along the medial and patellofemoral compartments - No other bony abnormalities  PATIENT SURVEYS:  LEFS 16/80  12/08/23 LEFS 31/80  POSTURE: Rounded shoulders  UPPER EXTREMITY ROM:   A/PROM Right eval Left eval R 12/08/23  Shoulder flexion 90/160  A: 153 deg  Shoulder extension     Shoulder abduction 90/160    Shoulder adduction     Shoulder internal rotation     Shoulder external rotation     Elbow flexion     Elbow extension     Wrist flexion     Wrist extension     Wrist ulnar deviation     Wrist radial deviation     Wrist pronation     Wrist supination     (Blank rows = not tested)  UPPER EXTREMITY  MMT:  MMT Right eval Left eval R/L 12/08/23    Shoulder flexion 4-  4+*/5  Shoulder extension     Shoulder abduction 4-  4+*/5  Shoulder adduction     Shoulder internal rotation     Shoulder external rotation     Middle trapezius  Lower trapezius     Elbow flexion     Elbow extension     Wrist flexion     Wrist extension     Wrist ulnar deviation     Wrist radial deviation     Wrist pronation     Wrist supination     Grip strength (lbs)     (Blank rows = not tested)  SHOULDER SPECIAL TESTS: Impingement tests: Neer impingement test: positive  and Hawkins/Kennedy impingement test: positive  Rotator cuff assessment: Drop arm test: negative, Empty can test: negative, and Hornblower's sign: negative   LOWER EXTREMITY MMT:    MMT Right eval Left eval R/L 12/08/23    Hip flexion     Hip extension     Hip abduction     Hip adduction     Hip internal rotation     Hip external rotation     Knee flexion  4 5/5  Knee extension  4- 5/4*  Ankle dorsiflexion     Ankle plantarflexion     Ankle inversion     Ankle eversion      (Blank rows = not tested)   LOWER EXTREMITY ROM:     Active  Right eval Left eval L 12/08/23   Hip flexion     Hip extension     Hip abduction     Hip adduction     Hip internal rotation     Hip external rotation     Knee flexion 125 135 A: 130 deg *  Knee extension     Ankle dorsiflexion     Ankle plantarflexion     Ankle inversion     Ankle eversion      (Blank rows = not tested)   LOWER EXTREMITY SPECIAL TESTS:  Knee special tests: Lateral pull sign: negative and varus/valgus stress negative for laxity  PALPATION:  TTP R biceps tendon, AC joint   Functional testing:  30s chair stand test 0 reps   12/08/23:  - 30secSTS 4 reps (beginning ascent for fifth rep at 30sec mark), moderate exertion and knee pain                                                                                                                            TREATMENT DATE:   [facility] Adult PT Treatment:                                                DATE: 01/15/2024  Therapeutic Exercise: Nustep L6 8 min Slant board calf stretch 2x1' Neuromuscular re-ed: Prone R shoulder extension 1000g ball 15x Prone R shoulder flexion 1000g ball 15x Prone R shoulder row 1000g ball 15x Prone R shoulder hor abd 1000 ball 15x Prone R shoulder scaption 1000g ball 15x Therapeutic Activity: Backward step downs 4 in 15x Heel  raise from 4 in step x10 Hurdle step overs x4 hurdles at counter fwd/ step to and step over step pattern   [facility] Adult PT Treatment:                                                DATE: 01/12/24 Therapeutic Exercise: Nustep L6 8 min Slant board calf stretch x1' Neuromuscular re-ed: Prone R shoulder extension 1000g ball 15x Prone R shoulder flexion 1000g ball 15x Prone R shoulder row 1000g ball 15x Prone R shoulder hor abd 1000 ball 15x Prone R shoulder scaption 1000g ball 15x Therapeutic Activity: Backward step downs 4 in 15x Heel raise from 4 in step x10 Hurdle step overs x4 hurdles at counter fwd/ step to and step over step pattern    [facility] Adult PT Treatment:                                                DATE: 01/10/24 Therapeutic Exercise: Nustep L6 8 min  Neuromuscular re-ed: Prone R shoulder extension 1000g ball 15x Prone R shoulder flexion 1000g ball 15x Prone R shoulder row 1000g ball 15x Prone R shoulder hor abd 1000g ball 15x Prone R shoulder scaption 1000g ball 10x Therapeutic Activity: Backward step downs 4 in 15x Heel raise from 4 in step 15x Sidestepping yellow band 2 trips   [facility] Adult PT Treatment:                                                DATE: 01/03/24 Therapeutic Exercise: Nustep L6 8 min Neuromuscular re-ed: Prone R shoulder extension 1000g ball 15x Prone R shoulder flexion 1000g ball 15x Prone R shoulder row 1000g ball 15x Prone R shoulder hor abd 1000g ball 15x Prone R shoulder scaption 1000g  ball 10x Therapeutic Activity: Heel raise off of 4 in step Sidestepping against yellow band 2 trips Runners step 4 in 10/10 w/UE support TKEs BlaTB 10x 3 way pull    PATIENT EDUCATION: Education details: rationale for interventions, HEP, PT goals/POC Person educated: Patient Education method: Explanation, Demonstration, Tactile cues, Verbal cues Education comprehension: verbalized understanding, returned demonstration, verbal cues required, tactile cues required, and needs further education     HOME EXERCISE PROGRAM: Access Code: [email or web address] URL: [email or web address] Date: 12/25/2023 Prepared by: Marko Molt  Exercises - Standing Isometric Shoulder External Rotation with Doorway  - 2-3 x daily - 7 x weekly - 1 sets - 5 reps - Standing Isometric Shoulder Internal Rotation at Doorway  - 2-3 x daily - 7 x weekly - 1 sets - 5 reps - Standing 'L' Stretch at Counter  - 2-3 x daily - 7 x weekly - 1 sets - 5 reps - Seated Heel Toe Raises  - 2-3 x daily - 7 x weekly - 1 sets - 10 reps - Seated Long Arc Quad  - 2-3 x daily - 7 x weekly - 1 sets - 10 reps - Horizontal Shoulder Pendulum with Table Support  - 2 x daily - 7 x weekly - 30 sec hold - Circular Shoulder Pendulum with Table Support  -  2 x daily - 7 x weekly - 30 sec hold  ASSESSMENT:   CLINICAL IMPRESSION:   Patient presents to PT reporting soreness in her calves, pain in her Lt knee, and stiffness in her Rt shoulder. Session today continued to focus on LE, shoulder, and periscapular strengthening. Additional stretches added today to mitigate increased soreness from previous session. She mentions difficulty with stepping over objects, added step over with hurdles today. Patient was able to tolerate all prescribed exercises with no adverse effects. Patient continues to benefit from skilled PT services and should be progressed as able to improve functional independence.    RECERT 12/08/2023: Pt arrives w/ report of 1-2/10  pain for shoulder/knee, improvement since fall but symptoms remain exacerbated; received MRI results which were reassuring, refer to [facility] for details. Looking at goals, pt demonstrates notable improvements in MMT/ROM and 30sec STS despite symptom exacerbation post fall, although deficits do persist and she has not yet fully met LTG. Continues to require rest breaks to maximize tolerance. HEP update today with emphasis on strategies to mitigate stiffness with prolonged sitting. Recommend extension of POC to address relevant deficits, minimize pain, and maximize functional tolerance. No adverse events, pt reports some UT soreness on departure but otherwise no change in symptoms. Pt departs today's session in no acute distress, all voiced questions/concerns addressed appropriately from PT perspective.       OBJECTIVE IMPAIRMENTS: Abnormal gait, decreased activity tolerance, decreased endurance, decreased knowledge of condition, decreased knowledge of use of DME, decreased mobility, difficulty walking, decreased ROM, decreased strength, impaired perceived functional ability, impaired UE functional use, postural dysfunction, obesity, and pain.   ACTIVITY LIMITATIONS: carrying, lifting, sitting, standing, squatting, stairs, dressing, reach over head, and locomotion level  PERSONAL FACTORS: Age, Behavior pattern, Fitness, Past/current experiences, Time since onset of injury/illness/exacerbation, and 1 comorbidity: Hx of long Covid are also affecting patient's functional outcome.   REHAB POTENTIAL: Good  CLINICAL DECISION MAKING: Stable/uncomplicated  EVALUATION COMPLEXITY: Moderate   GOALS: Goals reviewed with patient? No  SHORT TERM GOALS: Target date: 11/14/2023    Patient to demonstrate independence in HEP  Baseline: TBD due to time constraints 11/16/23: reports good HEP adherence Goal status: MET  2.  Patient will increase AROM R shoulder to 160d flexion an abduction Baseline:  A/PROM  Right eval Left eval  Shoulder flexion 90/160   Shoulder extension    Shoulder abduction 90/160    11/16/23: gross observation ~140 deg AAROM against gravity 12/08/23: 153 deg Goal status: PROGRESSING  3.  Increase L knee flexion to 130d Baseline: 125d 12/08/23: 130 deg painful Goal status: MET    LONG TERM GOALS: Target date: 01/19/2024  (Updated 12/08/23)    Patient will acknowledge 6/10 pain at least once during episode of care  in L knee and R shoulder Baseline: 10/10 12/08/23:  R shoulder: 2/10 currently, 1-10/10 in past week; L knee: 1/10 currently, 1-8/10 Goal status: MET  2.  Patient will score at least 45/80 on LEFS to signify clinically meaningful improvement in functional abilities.   Baseline: 16/80 12/08/23: 31/80 (meeting initial goal) Goal status: NEW/UPDATED 12/08/23  3.  Patient will increase 30s chair stand reps from 0 to 5 without arms to demonstrate and improved functional ability with less pain/difficulty as well as reduce fall risk.  Baseline:  12/08/23: 4 reps w/ exertion/pain Goal status: PROGRESSING  4.  Increase R shoulder strength to 4/5 Baseline:  MMT Right eval Left eval  Shoulder flexion 4-   Shoulder extension  Shoulder abduction 4-    12/08/23: see MMT chart above Goal status: MET  5.  Increase L knee strength to 4+/5 Baseline:  MMT Right eval Left eval  Hip flexion    Hip extension    Hip abduction    Hip adduction    Hip internal rotation    Hip external rotation    Knee flexion  4  Knee extension  4-   12/08/23: see MMT chart above Goal status: PROGRESSING    PLAN: (updated 12/08/23)  PT FREQUENCY: 1-2x/week  PT DURATION: 6 weeks  PLANNED INTERVENTIONS: 97110-Therapeutic exercises, 97530- Therapeutic activity, 97112- Neuromuscular re-education, 97535- Self Care, [id number]- Manual therapy, [phone]- Gait training, and Patient/Family education  PLAN FOR NEXT SESSION: HEP review and update, manual techniques as appropriate,  aerobic tasks, ROM and flexibility activities, strengthening and PREs, TPDN, gait and balance training as needed     Marko Molt PT  01/15/2024

## 2024-01-17 ENCOUNTER — Ambulatory Visit

## 2024-01-17 DIAGNOSIS — R2681 Unsteadiness on feet: Secondary | ICD-10-CM

## 2024-01-17 DIAGNOSIS — G8929 Other chronic pain: Secondary | ICD-10-CM | POA: Diagnosis not present

## 2024-01-17 DIAGNOSIS — M25511 Pain in right shoulder: Secondary | ICD-10-CM | POA: Diagnosis not present

## 2024-01-17 DIAGNOSIS — M25562 Pain in left knee: Secondary | ICD-10-CM | POA: Diagnosis not present

## 2024-01-17 NOTE — Therapy (Signed)
 OUTPATIENT PHYSICAL THERAPY NOTE   Patient Name: Kimberlee Shoun MRN: [id number] DOB:[birth year], 48 y.o., female Today's Date: 01/17/2024  END OF SESSION:  PT End of Session - 01/17/24 1738     Visit Number 18    Number of Visits 19    Date for PT Re-Evaluation 01/19/24    Authorization Type [employer]    PT Start Time 1745    PT Stop Time 1825    PT Time Calculation (min) 40 min    Activity Tolerance Patient tolerated treatment well    Behavior During Therapy WFL for tasks assessed/performed          Past Medical History:  Diagnosis Date   Asthma    Chronic pancreatitis (HCC)    DDD (degenerative disc disease), lumbosacral    DJD (degenerative joint disease)    Headache    Hyperlipidemia    Hypertension    Hypoglycemia    Obesity    Osteoarthritis    Prolactinoma (HCC)    Sciatica    Sleep apnea    Past Surgical History:  Procedure Laterality Date   CHOLECYSTECTOMY     COLONOSCOPY     CRANIOTOMY N/A 01/04/2022   Procedure: Endonasal endoscopic resection of pituitary tumor;  Surgeon: Cheryle Debby LABOR, MD;  Location: [facility] OR;  Service: Neurosurgery;  Laterality: N/A;  RM 20   NASAL SINUS SURGERY N/A 01/04/2022   Procedure: ENDOSCOPIC SINUS SURGERY TRANSNASAL PITUITARY REMOVAL WITH NASAL NASOSETTAL FLAP;  Surgeon: Llewellyn Gerard LABOR, DO;  Location: [facility] OR;  Service: ENT;  Laterality: N/A;   Patient Active Problem List   Diagnosis Date Noted   Class 3 severe obesity due to excess calories with body mass index (BMI) of 40.0 to 44.9 in adult 03/30/2023   Hypoglycemia 06/16/2022   Status post transsphenoidal pituitary resection (HCC) 01/04/2022   Low TSH level 10/21/2021   Dyslipidemia 07/15/2021   Osteoarthritis 08/20/2020   Sciatica 07/30/2020   DJD (degenerative joint disease)    Obstructive sleep apnea 03/11/2020   Snoring 02/05/2020   Pneumonia due to COVID-19 virus 12/15/2019   Acute respiratory failure with hypoxia (HCC) 12/15/2019   Hypokalemia 12/15/2019    Asthma without acute exacerbation 12/15/2019   Encounter for management of intrauterine contraceptive device (IUD) 06/09/2019   Essential hypertension 06/09/2019   Fatigue 06/09/2019   Slow transit constipation 06/09/2019   Hyperprolactinemia (HCC) 02/15/2018   Migraine 10/12/2017   Chronic pancreatitis (HCC) 11/09/2016   Prolactinoma (HCC) 11/09/2016   BMI 38.0-38.9,adult 11/09/2016   Candida rash of groin 11/09/2016   Mild intermittent asthma 11/09/2016   Mixed hyperlipidemia 11/09/2016    PCP: Celestia Rosaline SQUIBB, NP   REFERRING PROVIDER: Teressa Rainell BROCKS, DO  REFERRING DIAG: [phone] (ICD-10-CM) - Rotator cuff tendinitis, right M17.12 (ICD-10-CM) - Primary osteoarthritis of left knee  THERAPY DIAG:  Chronic pain of left knee  Chronic right shoulder pain  Unsteadiness on feet  Rationale for Evaluation and Treatment: Rehabilitation  ONSET DATE: Acute for shoulder, chronic for knee  SUBJECTIVE:  SUBJECTIVE STATEMENT:  Patient reports soreness in knee, but not worse than usual. Continues to struggle with lifting objects over a few pounds.   Hand dominance: Right  PERTINENT HISTORY: Also with right shoulder shoulder pain for the last 3 weeks.  No injury or trauma. She does note when she gets out of bed she typically pushes up on the right side No radiation of pain No numbness or tingling Previous shoulder issues for which she did physical therapy following COVID hospitalization in 2021 She has started doing some shoulder circles and wall crawls Evaluated at urgent care 10/05/23 -X-rays were unremarkable of the shoulder - Was given a sling - Rx ibuprofen  800 mg 3 times daily as needed.  This has been somewhat helpful - Recommended to follow-up with sports medicine  Patient with left knee pain  since 04/2023 and right shoulder pain x 3 weeks Left medial knee pain without any specific injury or trauma Pain along the medial knee Some occasional clicking and popping Seen in urgent care 10/05/2023 - Urgent care notes reviewed in detail during the visit today - Fitted with a knee brace - X-ray showed mild osteoarthritis Given Rx ibuprofen  800 mg 3 times daily as needed which has been somewhat helpful  PAIN:  Are you having pain? Yes: NPRS scale: see subjective/10 Pain location: L knee Pain description: ache Aggravating factors: descending stairs, prolonged walking/sitting Relieving factors: heat, topical gels  PAIN:  Are you having pain? 12/08/23 - R shoulder: 2/10 currently, 1-10/10 in past week - L knee: 1/10 currently, 1-8/10  Prior to 12/08/23 visit: NPRS scale: 10/10 Pain location: R shoulder Pain description: ache, sharp Aggravating factors: OH motions, unexpected motion Relieving factors: massage, heat/ice   PRECAUTIONS: None  RED FLAGS: None   WEIGHT BEARING RESTRICTIONS: No  FALLS:  Has patient fallen in last 6 months? No  OCCUPATION: Office work  PLOF: Independent  PATIENT GOALS:To manage my shoulder and knee pain  NEXT MD VISIT: 6 weeks  OBJECTIVE:  Note: Objective measures were completed at Evaluation unless otherwise noted.  DIAGNOSTIC FINDINGS:  Right shoulder x-ray 10/05/2023 at urgent care personally reviewed and interpreted by me showing: - Mild A/C joint degenerative changes - No other bony abnormalities   Left knee x-ray 10/05/2023 4 views AP/lateral/obliques personally reviewed and interpreted by me today showing: - Mild degenerative changes along the medial and patellofemoral compartments - No other bony abnormalities  PATIENT SURVEYS:  LEFS 16/80  12/08/23 LEFS 31/80  POSTURE: Rounded shoulders  UPPER EXTREMITY ROM:   A/PROM Right eval Left eval R 12/08/23  Shoulder flexion 90/160  A: 153 deg  Shoulder extension      Shoulder abduction 90/160    Shoulder adduction     Shoulder internal rotation     Shoulder external rotation     Elbow flexion     Elbow extension     Wrist flexion     Wrist extension     Wrist ulnar deviation     Wrist radial deviation     Wrist pronation     Wrist supination     (Blank rows = not tested)  UPPER EXTREMITY MMT:  MMT Right eval Left eval R/L 12/08/23    Shoulder flexion 4-  4+*/5  Shoulder extension     Shoulder abduction 4-  4+*/5  Shoulder adduction     Shoulder internal rotation     Shoulder external rotation     Middle trapezius     Lower trapezius  Elbow flexion     Elbow extension     Wrist flexion     Wrist extension     Wrist ulnar deviation     Wrist radial deviation     Wrist pronation     Wrist supination     Grip strength (lbs)     (Blank rows = not tested)  SHOULDER SPECIAL TESTS: Impingement tests: Neer impingement test: positive  and Hawkins/Kennedy impingement test: positive  Rotator cuff assessment: Drop arm test: negative, Empty can test: negative, and Hornblower's sign: negative   LOWER EXTREMITY MMT:    MMT Right eval Left eval R/L 12/08/23    Hip flexion     Hip extension     Hip abduction     Hip adduction     Hip internal rotation     Hip external rotation     Knee flexion  4 5/5  Knee extension  4- 5/4*  Ankle dorsiflexion     Ankle plantarflexion     Ankle inversion     Ankle eversion      (Blank rows = not tested)   LOWER EXTREMITY ROM:     Active  Right eval Left eval L 12/08/23   Hip flexion     Hip extension     Hip abduction     Hip adduction     Hip internal rotation     Hip external rotation     Knee flexion 125 135 A: 130 deg *  Knee extension     Ankle dorsiflexion     Ankle plantarflexion     Ankle inversion     Ankle eversion      (Blank rows = not tested)   LOWER EXTREMITY SPECIAL TESTS:  Knee special tests: Lateral pull sign: negative and varus/valgus stress negative for  laxity  PALPATION:  TTP R biceps tendon, AC joint   Functional testing:  30s chair stand test 0 reps   12/08/23:  - 30secSTS 4 reps (beginning ascent for fifth rep at 30sec mark), moderate exertion and knee pain                                                                                                                           TREATMENT DATE:  [facility] Adult PT Treatment:                                                DATE: 01/17/24 Therapeutic Exercise: Nustep L6 8 min Supine SLR Lt x10, x8 Therapeutic Activity: Walking forward on treadmill 1.3 mph x 5 mins (2-3/10 dyspnea scale) Stair training on small steps, focus on eccentric lowering  [facility] Adult PT Treatment:  DATE: 01/15/2024  Therapeutic Exercise: Nustep L6 8 min Slant board calf stretch 2x1' Neuromuscular re-ed: Prone R shoulder extension 1000g ball 15x Prone R shoulder flexion 1000g ball 15x Prone R shoulder row 1000g ball 15x Prone R shoulder hor abd 1000 ball 15x Prone R shoulder scaption 1000g ball 15x Therapeutic Activity: Backward step downs 4 in 15x Heel raise from 4 in step x10 Hurdle step overs x4 hurdles at counter fwd/ step to and step over step pattern  Walking Backwards on treadmill x 3 minutes, incline 3, speed 0.7 mph   [facility] Adult PT Treatment:                                                DATE: 01/12/24 Therapeutic Exercise: Nustep L6 8 min Slant board calf stretch x1' Neuromuscular re-ed: Prone R shoulder extension 1000g ball 15x Prone R shoulder flexion 1000g ball 15x Prone R shoulder row 1000g ball 15x Prone R shoulder hor abd 1000 ball 15x Prone R shoulder scaption 1000g ball 15x Therapeutic Activity: Backward step downs 4 in 15x Heel raise from 4 in step x10 Hurdle step overs x4 hurdles at counter fwd/ step to and step over step pattern     PATIENT EDUCATION: Education details: rationale for interventions, HEP, PT goals/POC Person  educated: Patient Education method: Explanation, Demonstration, Tactile cues, Verbal cues Education comprehension: verbalized understanding, returned demonstration, verbal cues required, tactile cues required, and needs further education     HOME EXERCISE PROGRAM: Access Code: [email or web address] URL: [email or web address] Date: 12/25/2023 Prepared by: Marko Molt  Exercises - Standing Isometric Shoulder External Rotation with Doorway  - 2-3 x daily - 7 x weekly - 1 sets - 5 reps - Standing Isometric Shoulder Internal Rotation at Doorway  - 2-3 x daily - 7 x weekly - 1 sets - 5 reps - Standing 'L' Stretch at Counter  - 2-3 x daily - 7 x weekly - 1 sets - 5 reps - Seated Heel Toe Raises  - 2-3 x daily - 7 x weekly - 1 sets - 10 reps - Seated Long Arc Quad  - 2-3 x daily - 7 x weekly - 1 sets - 10 reps - Horizontal Shoulder Pendulum with Table Support  - 2 x daily - 7 x weekly - 30 sec hold - Circular Shoulder Pendulum with Table Support  - 2 x daily - 7 x weekly - 30 sec hold  ASSESSMENT:   CLINICAL IMPRESSION:   Patient presents to PT reporting continued knee pain, shoulder has been doing well since trying out a new pillow at night. Session today continued to focus on eccentric LE strengthening with stair training. She wants to be able to use treadmill and elliptical and wants to be able to walk distances and descend stairs with more ease. Patient was able to tolerate all prescribed exercises with no adverse effects. Patient continues to benefit from skilled PT services and should be progressed as able to improve functional independence.   RECERT 12/08/2023: Pt arrives w/ report of 1-2/10 pain for shoulder/knee, improvement since fall but symptoms remain exacerbated; received MRI results which were reassuring, refer to [facility] for details. Looking at goals, pt demonstrates notable improvements in MMT/ROM and 30sec STS despite symptom exacerbation post fall, although deficits do persist and  she has not yet fully met LTG. Continues to require rest breaks to  maximize tolerance. HEP update today with emphasis on strategies to mitigate stiffness with prolonged sitting. Recommend extension of POC to address relevant deficits, minimize pain, and maximize functional tolerance. No adverse events, pt reports some UT soreness on departure but otherwise no change in symptoms. Pt departs today's session in no acute distress, all voiced questions/concerns addressed appropriately from PT perspective.       OBJECTIVE IMPAIRMENTS: Abnormal gait, decreased activity tolerance, decreased endurance, decreased knowledge of condition, decreased knowledge of use of DME, decreased mobility, difficulty walking, decreased ROM, decreased strength, impaired perceived functional ability, impaired UE functional use, postural dysfunction, obesity, and pain.   ACTIVITY LIMITATIONS: carrying, lifting, sitting, standing, squatting, stairs, dressing, reach over head, and locomotion level  PERSONAL FACTORS: Age, Behavior pattern, Fitness, Past/current experiences, Time since onset of injury/illness/exacerbation, and 1 comorbidity: Hx of long Covid are also affecting patient's functional outcome.   REHAB POTENTIAL: Good  CLINICAL DECISION MAKING: Stable/uncomplicated  EVALUATION COMPLEXITY: Moderate   GOALS: Goals reviewed with patient? No  SHORT TERM GOALS: Target date: 11/14/2023    Patient to demonstrate independence in HEP  Baseline: TBD due to time constraints 11/16/23: reports good HEP adherence Goal status: MET  2.  Patient will increase AROM R shoulder to 160d flexion an abduction Baseline:  A/PROM Right eval Left eval  Shoulder flexion 90/160   Shoulder extension    Shoulder abduction 90/160    11/16/23: gross observation ~140 deg AAROM against gravity 12/08/23: 153 deg Goal status: PROGRESSING  3.  Increase L knee flexion to 130d Baseline: 125d 12/08/23: 130 deg painful Goal status:  MET    LONG TERM GOALS: Target date: 01/19/2024  (Updated 12/08/23)    Patient will acknowledge 6/10 pain at least once during episode of care  in L knee and R shoulder Baseline: 10/10 12/08/23:  R shoulder: 2/10 currently, 1-10/10 in past week; L knee: 1/10 currently, 1-8/10 Goal status: MET  2.  Patient will score at least 45/80 on LEFS to signify clinically meaningful improvement in functional abilities.   Baseline: 16/80 12/08/23: 31/80 (meeting initial goal) Goal status: NEW/UPDATED 12/08/23  3.  Patient will increase 30s chair stand reps from 0 to 5 without arms to demonstrate and improved functional ability with less pain/difficulty as well as reduce fall risk.  Baseline:  12/08/23: 4 reps w/ exertion/pain Goal status: PROGRESSING  4.  Increase R shoulder strength to 4/5 Baseline:  MMT Right eval Left eval  Shoulder flexion 4-   Shoulder extension    Shoulder abduction 4-    12/08/23: see MMT chart above Goal status: MET  5.  Increase L knee strength to 4+/5 Baseline:  MMT Right eval Left eval  Hip flexion    Hip extension    Hip abduction    Hip adduction    Hip internal rotation    Hip external rotation    Knee flexion  4  Knee extension  4-   12/08/23: see MMT chart above Goal status: PROGRESSING    PLAN: (updated 12/08/23)  PT FREQUENCY: 1-2x/week  PT DURATION: 6 weeks  PLANNED INTERVENTIONS: 97110-Therapeutic exercises, 97530- Therapeutic activity, 97112- Neuromuscular re-education, 97535- Self Care, [id number]- Manual therapy, [phone]- Gait training, and Patient/Family education  PLAN FOR NEXT SESSION: HEP review and update, manual techniques as appropriate, aerobic tasks, ROM and flexibility activities, strengthening and PREs, TPDN, gait and balance training as needed     Corean Pouch PTA  01/17/2024 6:25 PM

## 2024-01-18 ENCOUNTER — Telehealth

## 2024-01-23 ENCOUNTER — Telehealth: Payer: Self-pay

## 2024-01-23 ENCOUNTER — Ambulatory Visit: Payer: Self-pay

## 2024-01-23 DIAGNOSIS — R2681 Unsteadiness on feet: Secondary | ICD-10-CM

## 2024-01-23 DIAGNOSIS — M25562 Pain in left knee: Secondary | ICD-10-CM | POA: Diagnosis not present

## 2024-01-23 DIAGNOSIS — M25511 Pain in right shoulder: Secondary | ICD-10-CM | POA: Diagnosis not present

## 2024-01-23 DIAGNOSIS — G8929 Other chronic pain: Secondary | ICD-10-CM | POA: Diagnosis not present

## 2024-01-23 NOTE — Therapy (Signed)
 OUTPATIENT PHYSICAL THERAPY NOTE/DISCHARGE NOTE   Patient Name: Beth [name] MRN: [id number] DOB:March 18, 1976, 48 y.o., female Today's Date: 01/23/2024  PHYSICAL THERAPY DISCHARGE SUMMARY  Visits from Start of Care: 19   Current functional level related to goals / functional outcomes: See objective findings/assessment  Remaining deficits: See objective findings/assessment    Education / Equipment: See today's treatment/assessment      Patient agrees to discharge. Patient goals were met. Patient is being discharged due to meeting the stated rehab goals.    END OF SESSION:  PT End of Session - 01/23/24 1758      Visit Number 19    Number of Visits 19     Date for PT Re-Evaluation 01/19/24     Authorization Type [employer]     PT Start Time 1752    PT Stop Time 1830     PT Time Calculation (min) 38 min     Activity Tolerance Patient tolerated treatment well     Behavior During Therapy WFL for tasks assessed/performed     Past Medical History:  Diagnosis Date   Asthma    Chronic pancreatitis (HCC)    DDD (degenerative disc disease), lumbosacral    DJD (degenerative joint disease)    Headache    Hyperlipidemia    Hypertension    Hypoglycemia    Obesity    Osteoarthritis    Prolactinoma (HCC)    Sciatica    Sleep apnea    Past Surgical History:  Procedure Laterality Date   CHOLECYSTECTOMY     COLONOSCOPY     CRANIOTOMY N/A 01/04/2022   Procedure: Endonasal endoscopic resection of pituitary tumor;  Surgeon: Cheryle Debby LABOR, MD;  Location: [facility] OR;  Service: Neurosurgery;  Laterality: N/A;  RM 20   NASAL SINUS SURGERY N/A 01/04/2022   Procedure: ENDOSCOPIC SINUS SURGERY TRANSNASAL PITUITARY REMOVAL WITH NASAL NASOSETTAL FLAP;  Surgeon: Llewellyn Gerard LABOR, DO;  Location: [facility] OR;  Service: ENT;  Laterality: N/A;   Patient Active Problem List   Diagnosis Date Noted   Class 3 severe obesity due to excess calories with body mass index (BMI) of 40.0 to 44.9 in adult  03/30/2023   Hypoglycemia 06/16/2022   Status post transsphenoidal pituitary resection (HCC) 01/04/2022   Low TSH level 10/21/2021   Dyslipidemia 07/15/2021   Osteoarthritis 08/20/2020   Sciatica 07/30/2020   DJD (degenerative joint disease)    Obstructive sleep apnea 03/11/2020   Snoring 02/05/2020   Pneumonia due to COVID-19 virus 12/15/2019   Acute respiratory failure with hypoxia (HCC) 12/15/2019   Hypokalemia 12/15/2019   Asthma without acute exacerbation 12/15/2019   Encounter for management of intrauterine contraceptive device (IUD) 06/09/2019   Essential hypertension 06/09/2019   Fatigue 06/09/2019   Slow transit constipation 06/09/2019   Hyperprolactinemia (HCC) 02/15/2018   Migraine 10/12/2017   Chronic pancreatitis (HCC) 11/09/2016   Prolactinoma (HCC) 11/09/2016   BMI 38.0-38.9,adult 11/09/2016   Candida rash of groin 11/09/2016   Mild intermittent asthma 11/09/2016   Mixed hyperlipidemia 11/09/2016    PCP: Celestia Rosaline SQUIBB, NP   REFERRING PROVIDER: Teressa Rainell BROCKS, DO  REFERRING DIAG: [phone] (ICD-10-CM) - Rotator cuff tendinitis, right M17.12 (ICD-10-CM) - Primary osteoarthritis of left knee  THERAPY DIAG:  Chronic pain of left knee  Chronic right shoulder pain  Unsteadiness on feet  Rationale for Evaluation and Treatment: Rehabilitation  ONSET DATE: Acute for shoulder, chronic for knee  SUBJECTIVE:  SUBJECTIVE STATEMENT:  Patient reports that she is able to walk without knee brace some times. She is able to walk up to a mile at once. She has been gradually increasing her activities. The most challenging activities at this time include lifting, walking tolerance and navigating a flight of stairs. Overall, patient reports perceived improved as follows: R shoulder 95%, L knee 90%  . She agrees with plan to d/c from PT with updated HEP and time spent discussing safe, independent  progression of walking on treadmill    Hand dominance: Right  PERTINENT HISTORY: Also with right shoulder shoulder pain for the last 3 weeks.  No injury or trauma. She does note when she gets out of bed she typically pushes up on the right side No radiation of pain No numbness or tingling Previous shoulder issues for which she did physical therapy following COVID hospitalization in 2021 She has started doing some shoulder circles and wall crawls Evaluated at urgent care 10/05/23 -X-rays were unremarkable of the shoulder - Was given a sling - Rx ibuprofen  800 mg 3 times daily as needed.  This has been somewhat helpful - Recommended to follow-up with sports medicine  Patient with left knee pain since 04/2023 and right shoulder pain x 3 weeks Left medial knee pain without any specific injury or trauma Pain along the medial knee Some occasional clicking and popping Seen in urgent care 10/05/2023 - Urgent care notes reviewed in detail during the visit today - Fitted with a knee brace - X-ray showed mild osteoarthritis Given Rx ibuprofen  800 mg 3 times daily as needed which has been somewhat helpful  PAIN:  Are you having pain? Yes: NPRS scale: see subjective/10 Pain location: L knee Pain description: ache Aggravating factors: descending stairs, prolonged walking/sitting Relieving factors: heat, topical gels  PAIN:  Are you having pain? 12/08/23 - R shoulder: 2/10 currently, 1-10/10 in past week - L knee: 1/10 currently, 1-8/10  Prior to 12/08/23 visit: NPRS scale: 10/10 Pain location: R shoulder Pain description: ache, sharp Aggravating factors: OH motions, unexpected motion Relieving factors: massage, heat/ice   PRECAUTIONS: None  RED FLAGS: None   WEIGHT BEARING RESTRICTIONS: No  FALLS:  Has patient fallen in last 6 months? No  OCCUPATION: Office work  PLOF:  Independent  PATIENT GOALS:To manage my shoulder and knee pain  NEXT MD VISIT: 6 weeks  OBJECTIVE:  Note: Objective measures were completed at Evaluation unless otherwise noted.  DIAGNOSTIC FINDINGS:  Right shoulder x-ray 10/05/2023 at urgent care personally reviewed and interpreted by me showing: - Mild A/C joint degenerative changes - No other bony abnormalities   Left knee x-ray 10/05/2023 4 views AP/lateral/obliques personally reviewed and interpreted by me today showing: - Mild degenerative changes along the medial and patellofemoral compartments - No other bony abnormalities  PATIENT SURVEYS:  LEFS 16/80  12/08/23 LEFS 31/80  POSTURE: Rounded shoulders  UPPER EXTREMITY ROM:   A/PROM Right eval Left eval R 12/08/23 R 01/23/24  Shoulder flexion 90/160  A: 153 deg [facility]  Shoulder extension      Shoulder abduction 90/160     Shoulder adduction      Shoulder internal rotation      Shoulder external rotation      Elbow flexion      Elbow extension      Wrist flexion      Wrist extension      Wrist ulnar deviation      Wrist radial deviation  Wrist pronation      Wrist supination      (Blank rows = not tested)  UPPER EXTREMITY MMT:  MMT Right eval Left eval R/L 12/08/23   R/L 01/23/24  Shoulder flexion 4-  4+*/5 5/5  Shoulder extension      Shoulder abduction 4-  4+*/5 5/5  Shoulder adduction      Shoulder internal rotation      Shoulder external rotation      Middle trapezius      Lower trapezius      Elbow flexion      Elbow extension      Wrist flexion      Wrist extension      Wrist ulnar deviation      Wrist radial deviation      Wrist pronation      Wrist supination      Grip strength (lbs)      (Blank rows = not tested)  SHOULDER SPECIAL TESTS: Impingement tests: Neer impingement test: positive  and Hawkins/Kennedy impingement test: positive  Rotator cuff assessment: Drop arm test: negative, Empty can test: negative, and Hornblower's sign:  negative   LOWER EXTREMITY MMT:    MMT Right eval Left eval R/L 12/08/23   R/L 01/23/24  Hip flexion      Hip extension      Hip abduction      Hip adduction      Hip internal rotation      Hip external rotation      Knee flexion  4 5/5 5/5  Knee extension  4- 5/4*   Ankle dorsiflexion      Ankle plantarflexion      Ankle inversion      Ankle eversion       (Blank rows = not tested)   LOWER EXTREMITY ROM:     Active  Right eval Left eval L 12/08/23   Hip flexion     Hip extension     Hip abduction     Hip adduction     Hip internal rotation     Hip external rotation     Knee flexion 125 135 A: 130 deg *  Knee extension     Ankle dorsiflexion     Ankle plantarflexion     Ankle inversion     Ankle eversion      (Blank rows = not tested)   LOWER EXTREMITY SPECIAL TESTS:  Knee special tests: Lateral pull sign: negative and varus/valgus stress negative for laxity  PALPATION:  TTP R biceps tendon, AC joint   Functional testing:  30s chair stand test 0 reps   12/08/23:  - 30secSTS 4 reps (beginning ascent for fifth rep at 30sec mark), moderate exertion and knee pain                                                                                                                           TREATMENT DATE:   [facility]  Adult PT Treatment:                                                DATE: 01/23/2024  Therapeutic Activity:  Reassessment of objective measures and subjective assessment regarding progress towards established goals and plan for independence with prescribed home program following discharged from PT TM walking x 8 minutes, concurrent  related patient education for safe/independent progression of walking speed and incline, including recommendation to increase one parameter at a time and monitor symptoms; Educated regarding use of RPE to quantify and monitor exertion levels  Updated HEP and reviewed        PATIENT EDUCATION: Education details: rationale for  interventions, HEP, PT goals/POC Person educated: Patient Education method: Explanation, Demonstration, Tactile cues, Verbal cues Education comprehension: verbalized understanding, returned demonstration, verbal cues required, tactile cues required, and needs further education     HOME EXERCISE PROGRAM: Access Code: [email or web address] URL: [email or web address] Date: 12/25/2023 Prepared by: Marko Molt  Exercises - Standing Isometric Shoulder External Rotation with Doorway  - 2-3 x daily - 7 x weekly - 1 sets - 5 reps - Standing Isometric Shoulder Internal Rotation at Doorway  - 2-3 x daily - 7 x weekly - 1 sets - 5 reps - Standing 'L' Stretch at Counter  - 2-3 x daily - 7 x weekly - 1 sets - 5 reps - Seated Heel Toe Raises  - 2-3 x daily - 7 x weekly - 1 sets - 10 reps - Seated Long Arc Quad  - 2-3 x daily - 7 x weekly - 1 sets - 10 reps - Horizontal Shoulder Pendulum with Table Support  - 2 x daily - 7 x weekly - 30 sec hold - Circular Shoulder Pendulum with Table Support  - 2 x daily - 7 x weekly - 30 sec hold  ASSESSMENT:   CLINICAL IMPRESSION:   01/23/2024 Beth [name] has attended 19 visits since initial evaluation. Patient has made great progress with shoulder and knee mobility, as well as strength related goals. She continues to have limitations with heavy lifting, ascending/descending several stairs at once, and walking tolerance is limited to 1 mile without seated rest break. She is encouraged to continue with prescribed home program and community walking program. Patient will be discharged from skilled PT at this time and should follow up with referring provider as needed.        OBJECTIVE IMPAIRMENTS: Abnormal gait, decreased activity tolerance, decreased endurance, decreased knowledge of condition, decreased knowledge of use of DME, decreased mobility, difficulty walking, decreased ROM, decreased strength, impaired perceived functional ability, impaired UE functional use,  postural dysfunction, obesity, and pain.   ACTIVITY LIMITATIONS: carrying, lifting, sitting, standing, squatting, stairs, dressing, reach over head, and locomotion level  PERSONAL FACTORS: Age, Behavior pattern, Fitness, Past/current experiences, Time since onset of injury/illness/exacerbation, and 1 comorbidity: Hx of long Covid are also affecting patient's functional outcome.   REHAB POTENTIAL: Good  CLINICAL DECISION MAKING: Stable/uncomplicated  EVALUATION COMPLEXITY: Moderate   GOALS: Goals reviewed with patient? No  SHORT TERM GOALS: Target date: 11/14/2023    Patient to demonstrate independence in HEP  Baseline: TBD due to time constraints 11/16/23: reports good HEP adherence Goal status: MET  2.  Patient will increase AROM R shoulder to 160d flexion an abduction Baseline:  A/PROM Right eval Left eval  Shoulder flexion 90/160  Shoulder extension    Shoulder abduction 90/160    11/16/23: gross observation ~140 deg AAROM against gravity 12/08/23: 153 deg 01/23/2024: see objective measures/WFL Goal status: MET  3.  Increase L knee flexion to 130d Baseline: 125d 12/08/23: 130 deg painful Goal status: MET    LONG TERM GOALS: Target date: 01/19/2024  (Updated 12/08/23)    Patient will acknowledge 6/10 pain at least once during episode of care  in L knee and R shoulder Baseline: 10/10 12/08/23:  R shoulder: 2/10 currently, 1-10/10 in past week; L knee: 1/10 currently, 1-8/10 Goal status: MET  2.  Patient will score at least 45/80 on LEFS to signify clinically meaningful improvement in functional abilities.   Baseline: 16/80 12/08/23: 31/80 (meeting initial goal) 01/23/24: 31/80 Goal status: not met  3.  Patient will increase 30s chair stand reps from 0 to 5 without arms to demonstrate and improved functional ability with less pain/difficulty as well as reduce fall risk.  Baseline:  12/08/23: 4 reps w/ exertion/pain 01/23/24: 9 rep w mild pain  Goal status:  MET  4.  Increase R shoulder strength to 4/5 Baseline:  MMT Right eval Left eval  Shoulder flexion 4-   Shoulder extension    Shoulder abduction 4-    12/08/23: see MMT chart above Goal status: MET  5.  Increase L knee strength to 4+/5 Baseline:  MMT Right eval Left eval  Hip flexion    Hip extension    Hip abduction    Hip adduction    Hip internal rotation    Hip external rotation    Knee flexion  4  Knee extension  4-   12/08/23: see MMT chart above Goal status: MET    PLAN: (updated 12/08/23)  PT FREQUENCY: 1-2x/week  PT DURATION: 6 weeks  PLANNED INTERVENTIONS: 97110-Therapeutic exercises, 97530- Therapeutic activity, [id number]- Neuromuscular re-education, 97535- Self Care, 02859- Manual therapy, [phone]- Gait training, and Patient/Family education    Marko Molt, PT, DPT  01/27/2024 6:46 PM

## 2024-01-25 ENCOUNTER — Other Ambulatory Visit: Payer: Self-pay

## 2024-01-25 NOTE — Patient Outreach (Signed)
 Complex Care Management   Visit Note  01/25/2024  Name:  Beth [name] MRN: [id number] DOB: Nov 18, 1975  Situation: Referral received for Complex Care Management related to HLD I obtained verbal consent from Patient.  Visit completed with Sanjuanita Essex  on the phone  Background:   Past Medical History:  Diagnosis Date   Asthma    Chronic pancreatitis (HCC)    DDD (degenerative disc disease), lumbosacral    DJD (degenerative joint disease)    Headache    Hyperlipidemia    Hypertension    Hypoglycemia    Obesity    Osteoarthritis    Prolactinoma (HCC)    Sciatica    Sleep apnea     Assessment: Patient Reported Symptoms:  Cognitive Cognitive Status: Able to follow simple commands, Alert and oriented to person, place, and time, Normal speech and language skills Cognitive/Intellectual Conditions Management [RPT]: None reported or documented in medical history or problem list   Health Maintenance Behaviors: Annual physical exam, Stress management  Neurological Neurological Review of Symptoms: Not assessed    HEENT HEENT Symptoms Reported: Not assessed      Cardiovascular Cardiovascular Symptoms Reported: Not assessed    Respiratory Respiratory Symptoms Reported: Not assesed Additional Respiratory Details: Reminded patient of upcoming pulmonology appointment Respiratory Management Strategies: CPAP  Endocrine Endocrine Symptoms Reported: No symptoms reported Is patient diabetic?: No Endocrine Comment: Patient has a pituitary MRI with oncology scheduled later this year. She had recent f/u with endo and labs came back normal.  Gastrointestinal Gastrointestinal Symptoms Reported: Other Other Gastrointestinal Symptoms: Chronic pancreatitis Additional Gastrointestinal Details: Patient reports BMs have become more regular since she has been walking more Gastrointestinal Management Strategies: Medication therapy, Diet modification, Fluid modification    Genitourinary  Genitourinary Symptoms Reported: Incontinence Additional Genitourinary Details: Patient reports her PT has had a focus on pelvic floor muscles for urinary incontinence. She does feel that episodes of incontinence have lessened. Genitourinary Management Strategies: Incontinence garment/pad, Activity  Integumentary Integumentary Symptoms Reported: Not assessed    Musculoskeletal Musculoskelatal Symptoms Reviewed: Unsteady gait Additional Musculoskeletal Details: Patient reports her pain is doing a lot better. She continues to use Ibuprofen  for pain relief. She reports continued stiffness worse in the morning or with prolonged sitting, relieved with movement. Musculoskeletal Management Strategies: Coping strategies, Exercise, Routine screening, Medical device Musculoskeletal Comment: Patient reports she was d/c'd from PT on Wednesday. She was provided exercises to continue to do at home. She does have access to a gym. Falls in the past year?: Yes Number of falls in past year: 1 or less Was there an injury with Fall?: Yes Fall Risk Category Calculator: 2 Patient Fall Risk Level: Moderate Fall Risk Patient at Risk for Falls Due to: Impaired balance/gait Fall risk Follow up: Falls evaluation completed, Education provided, Falls prevention discussed  Psychosocial Psychosocial Symptoms Reported: Not assessed (Working with LCSW)            12/21/2023   11:31 AM  Depression screen PHQ 2/9  Decreased Interest 2  Down, Depressed, Hopeless 1  PHQ - 2 Score 3  Altered sleeping 1  Tired, decreased energy 1  Change in appetite 3  Feeling bad or failure about yourself  1  Trouble concentrating 1  Moving slowly or fidgety/restless 0  Suicidal thoughts 0  PHQ-9 Score 10    There were no vitals filed for this visit.  Medications Reviewed Today     Reviewed by Arno Rosaline SQUIBB, RN (Registered Nurse) on 01/25/24 at 1004  Med List  Status: <None>   Medication Order Taking? Sig Documenting  Provider Last Dose Status Informant  albuterol  (VENTOLIN  HFA) 108 (90 Base) MCG/ACT inhaler [id number]  Inhale 2 puffs into the lungs every 6 (six) hours as needed for wheezing or shortness of breath. Shelah Lamar RAMAN, MD  Active   Ascorbic Acid  (VITAMIN C) 1000 MG tablet [id number]  Take 1,000 mg by mouth daily. [provider]  Active Self  benzonatate  (TESSALON ) 200 MG capsule [id number]  Take 1 capsule (200 mg total) by mouth 3 (three) times daily as needed for cough. Byrum, Robert S, MD  Active   Butenafine  HCl (MENTAX) 1 % cream [id number]  Apply 1 application topically 2 (two) times daily. Celestia Rosaline SQUIBB, NP  Active Self  Camphor-Menthol-Methyl Sal ALOHA BALM MUSCLE RUB EX) [id number]  Apply 1 oz topically as needed (patient uses asa needed). [provider]  Active   Cholecalciferol (VITAMIN D3 PO) [id number]  Take 2,000 mg by mouth daily. On Hold [provider]  Active Self  Docusate Sodium  (COLACE PO) [id number]  Take 1 capsule by mouth daily. [provider]  Active Self  EPINEPHrine  0.3 mg/0.3 mL IJ SOAJ injection [id number]  Inject 0.3 mg into the muscle as needed for anaphylaxis. [provider]  Active Self           Med Note SOILA LYLE JAYSON Pablo Dec 26, 2021  9:08 AM) Pt needs a refill, current pen is expired  fenofibrate  (TRICOR ) 145 MG tablet [id number]  Take 1 tablet (145 mg total) by mouth daily. Celestia Rosaline SQUIBB, NP  Active   fluticasone  (FLONASE ) 50 MCG/ACT nasal spray [id number]  Place 2 sprays into both nostrils daily. Byrum, Robert S, MD  Active Self  ipratropium-albuterol  (DUONEB) 0.5-2.5 (3) MG/3ML SOLN [id number]  Take 3 mLs by nebulization every 6 (six) hours as needed (shortness of breath, wheezing, and persistent cough). Arloa Suzen RAMAN, NP  Active   levonorgestrel  (MIRENA ) 20 MCG/24HR IUD [id number]  1 each by Intrauterine route once.  [provider]  Active Self  LORazepam  (ATIVAN ) 1 MG tablet [id number]  Take 1  tablet (1 mg total) by mouth every 8 (eight) hours as needed for anxiety (mri). Vaslow, Zachary K, MD  Active   Menthol, Topical Analgesic, (BIOFREEZE) 10 % CREA [id number]  Apply 1 Application topically daily as needed (pain). [provider]  Active Self  ondansetron  (ZOFRAN -ODT) 8 MG disintegrating tablet [id number]  Take 1 tablet (8 mg total) by mouth every 8 (eight) hours as needed for nausea or vomiting. Izell Domino, MD  Active   Pancrelipase , Lip-Prot-Amyl, (CREON ) 24000-76000 units CPEP [id number]  Take 2 capsules by mouth See admin instructions. Take 2 capsules three times daily with food and 1 capsule twice daily with snacks. [provider]  Active Self  pantoprazole  (PROTONIX ) 20 MG tablet [id number]  Take 20 mg by mouth daily. [provider]  Active Self  promethazine -dextromethorphan  (PROMETHAZINE -DM) 6.25-15 MG/5ML syrup [id number]  Take 5 mLs by mouth 3 (three) times daily as needed for cough. Arloa Suzen RAMAN, NP  Active   psyllium (REGULOID) 0.52 g capsule [id number]  Take 0.52 capsules by mouth as needed. [provider]  Active Self  rosuvastatin  (CRESTOR ) 40 MG tablet [id number]  Take 1 tablet (40 mg total) by mouth daily. Celestia Rosaline SQUIBB, NP  Active   Zinc  Sulfate (ZINC  15 PO) [id number]  Take 15 mg by mouth daily. [provider]  Active Self  Recommendation:   Continue Current Plan of Care  Follow Up Plan:   Telephone follow up appointment date/time:  02/22/24 at 11 AM  Rosaline Finlay, RN MSN [place]  [facility] RN Care Manager Direct Dial: [phone]  Fax: [phone]

## 2024-01-25 NOTE — Patient Instructions (Signed)
 Visit Information  Thank you for taking time to visit with me today. Please don't hesitate to contact me if I can be of assistance to you before our next scheduled appointment.  Your next care management appointment is by telephone on 02/22/24 at 11 AM  Please call the care guide team at [phone] if you need to cancel, schedule, or reschedule an appointment.   Please call the Suicide and Crisis Lifeline: 988 call 1-800-273-TALK (toll free, 24 hour hotline) if you are experiencing a Mental Health or Behavioral Health Crisis or need someone to talk to.  Rosaline Finlay, RN MSN [place]  [facility] RN Care Manager Direct Dial: [phone]  Fax: [phone]

## 2024-01-31 ENCOUNTER — Ambulatory Visit: Admitting: Emergency Medicine

## 2024-02-01 ENCOUNTER — Encounter: Payer: Self-pay | Admitting: Licensed Clinical Social Worker

## 2024-02-01 ENCOUNTER — Telehealth: Payer: Self-pay | Admitting: Licensed Clinical Social Worker

## 2024-02-01 NOTE — Patient Instructions (Signed)
 Beth [name] - I am sorry I was unable to reach you today for our scheduled appointment. I work with Celestia Rosaline SQUIBB, NP and am calling to support your healthcare needs. Please contact me at [phone] at your earliest convenience. I look forward to speaking with you soon.   Thank you,  Rolin Kerns, LCSW [place]  [facility], [facility] Clinical Social Worker Direct Dial: [phone]  Fax: [phone] Website: [email or web address] 4:33 PM

## 2024-02-11 DIAGNOSIS — G4733 Obstructive sleep apnea (adult) (pediatric): Secondary | ICD-10-CM | POA: Diagnosis not present

## 2024-02-22 ENCOUNTER — Other Ambulatory Visit: Payer: Self-pay

## 2024-02-22 NOTE — Patient Outreach (Signed)
 Complex Care Management   Visit Note  02/22/2024  Name:  Beth [name] MRN: [id number] DOB: [birth year]  Situation: Referral received for Complex Care Management related to HLD I obtained verbal consent from Patient.  Visit completed with Patient  on the phone  Background:   Past Medical History:  Diagnosis Date   Asthma    Chronic pancreatitis (HCC)    DDD (degenerative disc disease), lumbosacral    DJD (degenerative joint disease)    Headache    Hyperlipidemia    Hypertension    Hypoglycemia    Obesity    Osteoarthritis    Prolactinoma (HCC)    Sciatica    Sleep apnea     Assessment: Patient Reported Symptoms:  Cognitive Cognitive Status: Able to follow simple commands, Alert and oriented to person, place, and time, Normal speech and language skills Cognitive/Intellectual Conditions Management [RPT]: None reported or documented in medical history or problem list      Neurological Neurological Review of Symptoms: No symptoms reported    HEENT HEENT Symptoms Reported: No symptoms reported      Cardiovascular Cardiovascular Symptoms Reported: No symptoms reported    Respiratory Respiratory Symptoms Reported: No symptoms reported    Endocrine Endocrine Symptoms Reported: No symptoms reported Is patient diabetic?: No    Gastrointestinal Gastrointestinal Symptoms Reported: Not assessed      Genitourinary Genitourinary Symptoms Reported: Not assessed    Integumentary Integumentary Symptoms Reported: No symptoms reported    Musculoskeletal Musculoskelatal Symptoms Reviewed: Unsteady gait Additional Musculoskeletal Details: Paitent reports she continues to do PT on her own. She reports her pain is about the same but her mobility is better. Musculoskeletal Management Strategies: Coping strategies, Exercise, Medical device, Routine screening (Cane on occasion) Falls in the past year?: Yes Number of falls in past year: 1 or less (confirmed no falls since previous CMRN  visit) Was there an injury with Fall?: Yes Fall Risk Category Calculator: 2 Patient Fall Risk Level: Moderate Fall Risk Patient at Risk for Falls Due to: Impaired balance/gait Fall risk Follow up: Falls evaluation completed, Education provided, Falls prevention discussed  Psychosocial Psychosocial Symptoms Reported: Not assessed          02/22/2024    PHQ2-9 Depression Screening   Little interest or pleasure in doing things    Feeling down, depressed, or hopeless    PHQ-2 - Total Score    Trouble falling or staying asleep, or sleeping too much    Feeling tired or having little energy    Poor appetite or overeating     Feeling bad about yourself - or that you are a failure or have let yourself or your family down    Trouble concentrating on things, such as reading the newspaper or watching television    Moving or speaking so slowly that other people could have noticed.  Or the opposite - being so fidgety or restless that you have been moving around a lot more than usual    Thoughts that you would be better off dead, or hurting yourself in some way    PHQ2-9 Total Score    If you checked off any problems, how difficult have these problems made it for you to do your work, take care of things at home, or get along with other people    Depression Interventions/Treatment      There were no vitals filed for this visit.  Medications Reviewed Today     Reviewed by Arno Rosaline SQUIBB, RN (Registered Nurse) on  02/22/24 at 1109  Med List Status: <None>   Medication Order Taking? Sig Documenting Provider Last Dose Status Informant  albuterol  (VENTOLIN  HFA) 108 (90 Base) MCG/ACT inhaler [id number]  Inhale 2 puffs into the lungs every 6 (six) hours as needed for wheezing or shortness of breath. Shelah Lamar RAMAN, MD  Active   Ascorbic Acid  (VITAMIN C) 1000 MG tablet [id number]  Take 1,000 mg by mouth daily. [provider]  Active Self  benzonatate  (TESSALON ) 200 MG capsule [id number]   Take 1 capsule (200 mg total) by mouth 3 (three) times daily as needed for cough. Byrum, Robert S, MD  Active   Butenafine  HCl (MENTAX) 1 % cream [id number]  Apply 1 application topically 2 (two) times daily. Celestia Rosaline SQUIBB, NP  Active Self  Camphor-Menthol-Methyl Sal ALOHA BALM MUSCLE RUB EX) [id number]  Apply 1 oz topically as needed (patient uses asa needed). [provider]  Active   Cholecalciferol (VITAMIN D3 PO) [id number]  Take 2,000 mg by mouth daily. On Hold [provider]  Active Self  Docusate Sodium  (COLACE PO) [id number]  Take 1 capsule by mouth daily. [provider]  Active Self  EPINEPHrine  0.3 mg/0.3 mL IJ SOAJ injection [id number]  Inject 0.3 mg into the muscle as needed for anaphylaxis. [provider]  Active Self           Med Note SOILA LYLE JAYSON Pablo Dec 26, 2021  9:08 AM) Pt needs a refill, current pen is expired  fenofibrate  (TRICOR ) 145 MG tablet [id number]  Take 1 tablet (145 mg total) by mouth daily. Celestia Rosaline SQUIBB, NP  Active   fluticasone  (FLONASE ) 50 MCG/ACT nasal spray [id number]  Place 2 sprays into both nostrils daily. Byrum, Robert S, MD  Active Self  ipratropium-albuterol  (DUONEB) 0.5-2.5 (3) MG/3ML SOLN [id number]  Take 3 mLs by nebulization every 6 (six) hours as needed (shortness of breath, wheezing, and persistent cough). Arloa Suzen RAMAN, NP  Active   levonorgestrel  (MIRENA ) 20 MCG/24HR IUD [id number]  1 each by Intrauterine route once.  [provider]  Active Self  LORazepam  (ATIVAN ) 1 MG tablet [id number]  Take 1 tablet (1 mg total) by mouth every 8 (eight) hours as needed for anxiety (mri). Vaslow, Zachary K, MD  Active   Menthol, Topical Analgesic, (BIOFREEZE) 10 % CREA [id number]  Apply 1 Application topically daily as needed (pain). [provider]  Active Self  ondansetron  (ZOFRAN -ODT) 8 MG disintegrating tablet [id number]  Take 1 tablet (8 mg total) by mouth every 8 (eight) hours as needed for  nausea or vomiting. Izell Domino, MD  Active   Pancrelipase , Lip-Prot-Amyl, (CREON ) 24000-76000 units CPEP [id number]  Take 2 capsules by mouth See admin instructions. Take 2 capsules three times daily with food and 1 capsule twice daily with snacks. [provider]  Active Self  pantoprazole  (PROTONIX ) 20 MG tablet [id number]  Take 20 mg by mouth daily. [provider]  Active Self  promethazine -dextromethorphan  (PROMETHAZINE -DM) 6.25-15 MG/5ML syrup [id number]  Take 5 mLs by mouth 3 (three) times daily as needed for cough. Arloa Suzen RAMAN, NP  Active   psyllium (REGULOID) 0.52 g capsule [id number]  Take 0.52 capsules by mouth as needed. [provider]  Active Self  rosuvastatin  (CRESTOR ) 40 MG tablet [id number]  Take 1 tablet (40 mg total) by mouth daily. Celestia Rosaline SQUIBB, NP  Active   Zinc  Sulfate (ZINC  15 PO) [id number]  Take 15 mg by mouth daily. [provider]  Active  Self            Recommendation:   Continue Current Plan of Care  Follow Up Plan:   Patient has met all RN care management goals. RN Care Management case will be closed. Patient has been provided contact information should new needs arise.   Rosaline Finlay, RN MSN [place]  [facility] RN Care Manager Direct Dial: [phone]  Fax: [phone]

## 2024-02-22 NOTE — Patient Instructions (Signed)
 Visit Information  Thank you for taking time to visit with me today. Please don't hesitate to contact me if I can be of assistance to you before our next scheduled appointment.  Your next care management appointment is no further scheduled appointments.   Please call the care guide team at [phone] if you need to cancel, schedule, or reschedule an appointment.   Please call the Suicide and Crisis Lifeline: 988 call 1-800-273-TALK (toll free, 24 hour hotline) if you are experiencing a Mental Health or Behavioral Health Crisis or need someone to talk to.  Rosaline Finlay, RN MSN [place]  [facility] Population [facility] RN Care Manager Direct Dial: [phone]  Fax: [phone]

## 2024-03-13 ENCOUNTER — Telehealth: Payer: Self-pay | Admitting: *Deleted

## 2024-03-13 DIAGNOSIS — G4733 Obstructive sleep apnea (adult) (pediatric): Secondary | ICD-10-CM | POA: Diagnosis not present

## 2024-03-13 NOTE — Progress Notes (Signed)
 Complex Care Management Care Guide Note  03/13/2024 Name: Wylene Weissman MRN: [id number] DOB: [birth year]  Erdine Eyman is a 48 y.o. year old female who is a primary care patient of Celestia Rosaline SQUIBB, NP and is actively engaged with the care management team. I reached out to Sanjuanita Essex by phone today to assist with re-scheduling  with the Licensed Clinical Child psychotherapist.  Follow up plan: Unsuccessful telephone outreach attempt made. A HIPAA compliant phone message was left for the patient providing contact information and requesting a return call.  Harlene Satterfield  [facility]  [facility], [facility] Guide  Direct Dial: [phone]  Fax [phone]

## 2024-03-19 NOTE — Progress Notes (Signed)
 Complex Care Management Care Guide Note  03/19/2024 Name: Madia Carvell MRN: [id number] DOB: 04-26-1976  Faydra Mihalko is a 48 y.o. year old female who is a primary care patient of Celestia Rosaline SQUIBB, NP and is actively engaged with the care management team. I reached out to Sanjuanita Essex by phone today to assist with re-scheduling  with the Licensed Clinical Child psychotherapist.  Follow up plan: Unsuccessful telephone outreach attempt made. A HIPAA compliant phone message was left for the patient providing contact information and requesting a return call. No further outreach attempts will be made at this time. We have been unable to contact the patient to reschedule for complex care management services.   Harlene Satterfield  [facility]  Value-Based Care Institute, [facility] Guide  Direct Dial: [phone]  Fax [phone]

## 2024-04-02 ENCOUNTER — Encounter: Payer: Self-pay | Admitting: Emergency Medicine

## 2024-04-02 ENCOUNTER — Ambulatory Visit (INDEPENDENT_AMBULATORY_CARE_PROVIDER_SITE_OTHER): Admitting: Emergency Medicine

## 2024-04-02 VITALS — BP 114/76 | HR 75 | Ht 69.0 in | Wt 270.0 lb

## 2024-04-02 DIAGNOSIS — G4733 Obstructive sleep apnea (adult) (pediatric): Secondary | ICD-10-CM

## 2024-04-02 DIAGNOSIS — J45909 Unspecified asthma, uncomplicated: Secondary | ICD-10-CM | POA: Diagnosis not present

## 2024-04-02 MED ORDER — ALBUTEROL SULFATE HFA 108 (90 BASE) MCG/ACT IN AERS: 2.0000 | INHALATION_SPRAY | Freq: Four times a day (QID) | RESPIRATORY_TRACT | 6 refills | Status: AC | PRN

## 2024-04-02 NOTE — Assessment & Plan Note (Signed)
 Overall doing well.  She did have a flare due to a URI that responded appropriately.  We did discuss the fact that if she begins to flare frequently we could consider ICS or scheduled BD therapy.  For now we will continue the albuterol  as needed.  Discussed the flu shot with her.  She wants to defer.

## 2024-04-02 NOTE — Progress Notes (Signed)
   Subjective:    Patient ID: Beth [name], female    DOB: [birth year], 48 y.o.   MRN: [id number]  Asthma Her past medical history is significant for asthma.    ROV 01/25/2023 --48 year old woman with a history of asthma, OSA on CPAP, pituitary tumor resected 12/2021 with a CSF leak and associated rhinorrhea, subsequently treated with XRT.  She was off CPAP while this was being addressed.  She has not been able to get back on it - was getting a leak alarm and wasn't getting the right pressure, felt smothering like air wasn't blowing.    Currently managed on Flonase , Protonix , uses Tessalon  Perles for chronic cough.  She has albuterol  which she uses very rarely. No flares. She has been walking more. She keeps her SABA available. She has quite a bit of drainage post-op, uses nasal rinses, does impact her cough.   ROV 04/02/2024 --follow-up visit for 48 year old woman with a history of asthma and OSA on CPAP.  Also has a history of a pituitary tumor that was resected, CSF leak, subsequent XRT.  She has GERD on PPI.  She reports today that she is doing fairly well. She will still get some SOB w longer distances and when walking quickly. She does still have some equilibrium effects from her pituitary treatments. Usually no wheeze, sometimes w heavier exertion. She had a URI since I last saw her and she was rx w pred, cough meds, abx, albuterol  nebs. Uses albuterol  about every 3-4 weeks. She is reliable w her CPAP, does get benefit. Less daytime sleepiness. She does not usually get the flu shot - had trouble w it before     Review of Systems As per HPI     Objective:   Physical Exam Vitals:   04/02/24 1423  BP: 114/76  Pulse: 75  SpO2: 99%  Weight: 270 lb (122.5 kg)  Height: 5' 9 (1.753 m)     Gen: Pleasant, obese woman, in no distress,  normal affect  ENT: No lesions,  mouth clear,  oropharynx clear, no postnasal drip  Neck: No JVD, no stridor  Lungs: No use of accessory muscles, no  wheeze or crackles.   Cardiovascular: RRR, heart sounds normal, no murmur or gallops, no peripheral edema  Musculoskeletal: No deformities, no cyanosis or clubbing  Neuro: alert, awake, non focal  Skin: Warm, no lesions or rash      Assessment & Plan:  Asthma without acute exacerbation Overall doing well.  She did have a flare due to a URI that responded appropriately.  We did discuss the fact that if she begins to flare frequently we could consider ICS or scheduled BD therapy.  For now we will continue the albuterol  as needed.  Discussed the flu shot with her.  She wants to defer.  Obstructive sleep apnea Continue your CPAP every night as you have been using it.  Today we documented good compliance and good clinical benefit. Your sleep apnea and asthma puts you at moderately increased risk for sedation for your colonoscopy.  There is no contraindication for proceeding as long as your asthma is not flaring.  You should remind the proceduralist that you have a history of sleep apnea so that this can be taken in account with your sedation plan.     Lamar Chris, MD, PhD 04/02/2024, 2:42 PM [facility] [phone] or if no answer [phone]

## 2024-04-02 NOTE — Assessment & Plan Note (Signed)
 Continue your CPAP every night as you have been using it.  Today we documented good compliance and good clinical benefit. Your sleep apnea and asthma puts you at moderately increased risk for sedation for your colonoscopy.  There is no contraindication for proceeding as long as your asthma is not flaring.  You should remind the proceduralist that you have a history of sleep apnea so that this can be taken in account with your sedation plan.

## 2024-04-02 NOTE — Patient Instructions (Signed)
 Please keep your albuterol  available to use 2 puffs when needed for shortness of breath, chest tightness, wheezing. Continue your CPAP every night as you have been using it.  Today we documented good compliance and good clinical benefit. Your sleep apnea and asthma puts you at moderately increased risk for sedation for your colonoscopy.  There is no contraindication for proceeding as long as your asthma is not flaring.  You should remind the proceduralist that you have a history of sleep apnea so that this can be taken in account with your sedation plan. Please follow Dr. Shelah in 1 year, call sooner if you have any problems.

## 2024-05-20 DIAGNOSIS — Z800 Family history of malignant neoplasm of digestive organs: Secondary | ICD-10-CM | POA: Diagnosis not present

## 2024-05-20 DIAGNOSIS — Z86018 Personal history of other benign neoplasm: Secondary | ICD-10-CM | POA: Diagnosis not present

## 2024-05-20 DIAGNOSIS — Z83719 Family history of colon polyps, unspecified: Secondary | ICD-10-CM | POA: Diagnosis not present

## 2024-06-05 ENCOUNTER — Inpatient Hospital Stay: Admission: RE | Admit: 2024-06-05 | Discharge: 2024-06-05 | Payer: BC Managed Care – PPO | Attending: Internal Medicine

## 2024-06-05 DIAGNOSIS — D352 Benign neoplasm of pituitary gland: Secondary | ICD-10-CM

## 2024-06-05 MED ORDER — GADOPICLENOL 0.5 MMOL/ML IV SOLN
10.0000 mL | Freq: Once | INTRAVENOUS | Status: AC | PRN
  Administered 2024-06-05: 10 mL via INTRAVENOUS

## 2024-06-05 MED ORDER — GADOPICLENOL 0.5 MMOL/ML IV SOLN: 10.0000 mL | Freq: Once | INTRAVENOUS | Status: DC | PRN

## 2024-06-16 ENCOUNTER — Ambulatory Visit: Payer: BC Managed Care – PPO | Admitting: Internal Medicine

## 2024-06-16 ENCOUNTER — Other Ambulatory Visit: Payer: BC Managed Care – PPO

## 2024-06-17 ENCOUNTER — Other Ambulatory Visit: Payer: BC Managed Care – PPO

## 2024-06-17 ENCOUNTER — Inpatient Hospital Stay

## 2024-06-17 ENCOUNTER — Inpatient Hospital Stay: Attending: Internal Medicine | Admitting: Internal Medicine

## 2024-06-17 ENCOUNTER — Ambulatory Visit: Payer: BC Managed Care – PPO | Admitting: Internal Medicine

## 2024-06-17 VITALS — BP 132/76 | HR 67 | Temp 97.7°F | Resp 20 | Wt 275.0 lb

## 2024-06-17 DIAGNOSIS — D352 Benign neoplasm of pituitary gland: Secondary | ICD-10-CM | POA: Insufficient documentation

## 2024-06-17 NOTE — Progress Notes (Signed)
 "  [facility] [address]  [place], [place] [address] [phone]   Interval Evaluation  Date of Service: 06/17/2024 Patient Name: Beth [name] Patient MRN: [id number] Patient DOB: 1976/03/16 Provider: Arthea MARLA Manns, MD  Identifying Statement:  Beth [name] is a 48 y.o. female with pituitary adenoma who presents for initial consultation and evaluation.    Referring Provider: Celestia Rosaline SQUIBB, NP [address] [place],  [place] [address]  Oncologic History: 01/04/22: Trans-sphenoidal debulking resection of macroadenoma with Dr. Cheryle 06/14/22: Completes SRS 25/5 with Dr. Izell  Biomarkers:  MGMT Unknown.  IDH 1/2 Unknown.  EGFR Unknown  TERT Unknown   Interval History: [name] presents today for follow up after recent MRI brain.  She continues to have left sided visual impairment as prior.  Denies new or progressive neurologic deficits today.  Some short term memory issues. Denies headaches, seizures.  Did have some claustrophobia with the MRI study.  H+P (03/21/22) Patient presents today to review her pituitary adenoma.  She initially presented to medical attention maybe 20 years ago with galactorrhea, with apparently quite elevated prolactin levels.  She was treated with bromocriptine , per patient, without any CNS imaging performed.  This did lead to stabilization of symptoms.  This year, she underwent evaluation with optometrist which demonstrated bitemporal visual field deficit.  Referral was made for endocrinology, who performed CNS imaging, demonstrating very large sellar based mass.  She then underwent trans-sphenoidal resection in July 2023 with Dr. Cheryle; tumor was debulked, pathology confirmed pituitary adenoma.  Surgery led to improvement in peripheral vision on the right side only.  Surgical site infection post-op was treated with Augmentin, now resolved.    Medications: Current Outpatient  Medications on File Prior to Visit  Medication Sig Dispense Refill   albuterol  (VENTOLIN  HFA) 108 (90 Base) MCG/ACT inhaler Inhale 2 puffs into the lungs every 6 (six) hours as needed for wheezing or shortness of breath. 8 g 6   Ascorbic Acid  (VITAMIN C) 1000 MG tablet Take 1,000 mg by mouth daily.     benzonatate  (TESSALON ) 200 MG capsule Take 1 capsule (200 mg total) by mouth 3 (three) times daily as needed for cough. (Patient not taking: Reported on 04/02/2024) 30 capsule 3   Butenafine  HCl (MENTAX) 1 % cream Apply 1 application topically 2 (two) times daily. 24 g 1   Camphor-Menthol-Methyl Sal (TIGER BALM MUSCLE RUB EX) Apply 1 oz topically as needed (patient uses asa needed).     Cholecalciferol (VITAMIN D3 PO) Take 2,000 mg by mouth daily. On Hold     Docusate Sodium  (COLACE PO) Take 1 capsule by mouth daily.     EPINEPHrine  0.3 mg/0.3 mL IJ SOAJ injection Inject 0.3 mg into the muscle as needed for anaphylaxis.     fenofibrate  (TRICOR ) 145 MG tablet Take 1 tablet (145 mg total) by mouth daily. 90 tablet 1   fluticasone  (FLONASE ) 50 MCG/ACT nasal spray Place 2 sprays into both nostrils daily. 16 g 3   ipratropium-albuterol  (DUONEB) 0.5-2.5 (3) MG/3ML SOLN Take 3 mLs by nebulization every 6 (six) hours as needed (shortness of breath, wheezing, and persistent cough). 360 mL 0   levonorgestrel  (MIRENA ) 20 MCG/24HR IUD 1 each by Intrauterine route once.      LORazepam  (ATIVAN ) 1 MG tablet Take 1 tablet (1 mg total) by mouth every 8 (eight) hours as needed for anxiety (mri). 3 tablet 0   Menthol, Topical Analgesic, (BIOFREEZE) 10 % CREA  Apply 1 Application topically daily as needed (pain).     ondansetron  (ZOFRAN -ODT) 8 MG disintegrating tablet Take 1 tablet (8 mg total) by mouth every 8 (eight) hours as needed for nausea or vomiting. 20 tablet 1   Pancrelipase , Lip-Prot-Amyl, (CREON ) 24000-76000 units CPEP Take 2 capsules by mouth See admin instructions. Take 2 capsules three times daily with food  and 1 capsule twice daily with snacks.     pantoprazole  (PROTONIX ) 20 MG tablet Take 20 mg by mouth daily.     promethazine -dextromethorphan  (PROMETHAZINE -DM) 6.25-15 MG/5ML syrup Take 5 mLs by mouth 3 (three) times daily as needed for cough. (Patient not taking: Reported on 04/02/2024) 140 mL 0   psyllium (REGULOID) 0.52 g capsule Take 0.52 capsules by mouth as needed.     rosuvastatin  (CRESTOR ) 40 MG tablet Take 1 tablet (40 mg total) by mouth daily. 90 tablet 3   Zinc  Sulfate (ZINC  15 PO) Take 15 mg by mouth daily.     No current facility-administered medications on file prior to visit.    Allergies:  Allergies  Allergen Reactions   Bahia Hives, Shortness Of Breath and Swelling    Found through allergy testing, not sure which item caused the shortness of breath   Bermuda Grass Hives, Shortness Of Breath and Swelling    Found through allergy testing, not sure which item caused the shortness of breath   [employer], Shortness Of Breath and Swelling    Found through allergy testing, not sure which item caused the shortness of breath   Cladosporium Cladosporioides Hives, Shortness Of Breath and Swelling    Found through allergy testing, not sure which item caused the shortness of breath   Dust Mite Extract Hives, Shortness Of Breath and Swelling    Found through allergy testing, not sure which item caused the shortness of breath   Elm Bark [Ulmus Fulva] Hives, Shortness Of Breath and Swelling    Found through allergy testing, not sure which item caused the shortness of breath   Molds & Smuts Hives, Shortness Of Breath and Swelling    Found through allergy testing, not sure which item caused the shortness of breath   Mugwort Hives, Shortness Of Breath and Swelling    Found through allergy testing, not sure which item caused the shortness of breath   Nettle [Nettle (Urtica Dioica)] Hives, Shortness Of Breath and Swelling   Red Mulberry Allergy Skin Test Hives, Shortness Of Breath and  Swelling    Found through allergy testing, not sure which item caused the shortness of breath   Sorrel-Dock Mix [Sheep Sorrel-Yellow Dock] Hives, Shortness Of Breath and Swelling    Found through allergy testing, not sure which item caused the shortness of breath   Timothy Grass Pollen Allergen Hives, Shortness Of Breath and Swelling    Found through allergy testing, not sure which item caused the shortness of breath   American Cockroach    Mixed Ragweed    Past Medical History:  Past Medical History:  Diagnosis Date   Asthma    Chronic pancreatitis (HCC)    DDD (degenerative disc disease), lumbosacral    DJD (degenerative joint disease)    Headache    Hyperlipidemia    Hypertension    Hypoglycemia    Obesity    Osteoarthritis    Prolactinoma (HCC)    Sciatica    Sleep apnea    Past Surgical History:  Past Surgical History:  Procedure Laterality Date   CHOLECYSTECTOMY  COLONOSCOPY     CRANIOTOMY N/A 01/04/2022   Procedure: Endonasal endoscopic resection of pituitary tumor;  Surgeon: Cheryle Debby LABOR, MD;  Location: [facility] OR;  Service: Neurosurgery;  Laterality: N/A;  RM 20   NASAL SINUS SURGERY N/A 01/04/2022   Procedure: ENDOSCOPIC SINUS SURGERY TRANSNASAL PITUITARY REMOVAL WITH NASAL NASOSETTAL FLAP;  Surgeon: Llewellyn Gerard LABOR, DO;  Location: [facility] OR;  Service: ENT;  Laterality: N/A;   Social History:  Social History   Socioeconomic History   Marital status: Divorced    Spouse name: Not on file   Number of children: 3   Years of education: Not on file   Highest education level: Some college, no degree  Occupational History   Not on file  Tobacco Use   Smoking status: Never    Passive exposure: Never   Smokeless tobacco: Never  Vaping Use   Vaping status: Never Used  Substance and Sexual Activity   Alcohol use: Never   Drug use: Never   Sexual activity: Yes    Birth control/protection: I.U.D.  Other Topics Concern   Not on file  Social History Narrative    Not on file   Social Drivers of Health   Tobacco Use: Low Risk (05/23/2024)   Received from [facility]   Patient History    Smoking Tobacco Use: Never    Smokeless Tobacco Use: Never    Passive Exposure: Never  Financial Resource Strain: High Risk (06/29/2023)   Overall Financial Resource Strain (CARDIA)    Difficulty of Paying Living Expenses: Hard  Food Insecurity: Food Insecurity Present (12/21/2023)   Epic    Worried About Programme Researcher, Broadcasting/film/video in the Last Year: Sometimes true    Ran Out of Food in the Last Year: Sometimes true  Transportation Needs: No Transportation Needs (12/21/2023)   Epic    Lack of Transportation (Medical): No    Lack of Transportation (Non-Medical): No  Physical Activity: Unknown (06/29/2023)   Exercise Vital Sign    Days of Exercise per Week: 0 days    Minutes of Exercise per Session: Not on file  Stress: Stress Concern Present (06/29/2023)   [employer] of Occupational Health - Occupational Stress Questionnaire    Feeling of Stress : To some extent  Social Connections: Unknown (06/29/2023)   Social Connection and Isolation Panel    Frequency of Communication with Friends and Family: More than three times a week    Frequency of Social Gatherings with Friends and Family: Patient declined    Attends Religious Services: Patient declined    Database Administrator or Organizations: Yes    Attends Engineer, Structural: More than 4 times per year    Marital Status: Divorced  Intimate Partner Violence: Not At Risk (12/21/2023)   Epic    Fear of Current or Ex-Partner: No    Emotionally Abused: No    Physically Abused: No    Sexually Abused: No  Depression (PHQ2-9): Medium Risk (12/21/2023)   Depression (PHQ2-9)    PHQ-2 Score: 10  Alcohol Screen: Not on file  Housing: Low Risk (12/21/2023)   Epic    Unable to Pay for Housing in the Last Year: No    Number of Times Moved in the Last Year: 0    Homeless in the Last Year: No  Utilities: Not At  Risk (12/21/2023)   Epic    Threatened with loss of utilities: No  Health Literacy: Low Risk (09/08/2022)   [facility] from [facility]   Health Literacy    How often do you need to have someone help you when you read instructions, pamphlets, or other written material from your doctor or pharmacy?: Never   Family History:  Family History  Problem Relation Age of Onset   Hypertension Mother    Diabetes Mother    Colon cancer Mother    Arthritis Mother    Thyroid  disease Mother    Asthma Mother    Osteoporosis Mother    Diabetes Father    Hypertension Father    Bell's palsy Father    Hypertension Sister    Heart murmur Sister    Lupus Sister    Hypertension Sister    Polycystic ovary syndrome Sister    Scoliosis Sister    Hypertension Brother    Heart failure Brother    Cancer Brother    Other Paternal Aunt        pituitary surgery    Review of Systems: Constitutional: Doesn't report fevers, chills or abnormal weight loss Eyes: Doesn't report blurriness of vision Ears, nose, mouth, throat, and face: Doesn't report sore throat Respiratory: Doesn't report cough, dyspnea or wheezes Cardiovascular: Doesn't report palpitation, chest discomfort  Gastrointestinal:  Doesn't report nausea, constipation, diarrhea GU: Doesn't report incontinence Skin: Doesn't report skin rashes Neurological: Per HPI Musculoskeletal: Doesn't report joint pain Behavioral/Psych: Doesn't report anxiety  Physical Exam: There were no vitals filed for this visit.   KPS: 90. General: Alert, cooperative, pleasant, in no acute distress Head: Normal EENT: No conjunctival injection or scleral icterus.  Lungs: Resp effort normal Cardiac: Regular rate Abdomen: Non-distended abdomen Skin: No rashes cyanosis or petechiae. Extremities: No clubbing or edema  Neurologic Exam: Mental Status: Awake, alert, attentive to examiner. Oriented to self and environment. Language is fluent with intact  comprehension.  Cranial Nerves: Visual acuity is grossly normal. Left temporal field hemianopia. Extra-ocular movements intact. No ptosis. Face is symmetric Motor: Tone and bulk are normal. Power is full in both arms and legs. Reflexes are symmetric, no pathologic reflexes present.  Sensory: Intact to light touch Gait: Normal.   Labs: I have reviewed the data as listed    Component Value Date/Time   NA 141 10/11/2023 1535   K 4.3 10/11/2023 1535   CL 106 10/11/2023 1535   CO2 23 10/11/2023 1535   GLUCOSE 85 10/11/2023 1535   GLUCOSE 105 (H) 01/13/2022 0500   BUN 9 10/11/2023 1535   CREATININE 1.01 (H) 10/11/2023 1535   CREATININE 0.86 05/22/2022 1606   CREATININE 0.86 10/21/2021 1610   CALCIUM  9.6 10/11/2023 1535   PROT 6.6 10/11/2023 1535   ALBUMIN 4.2 10/11/2023 1535   AST 16 10/11/2023 1535   ALT 15 10/11/2023 1535   ALKPHOS 77 10/11/2023 1535   BILITOT 0.4 10/11/2023 1535   GFRNONAA >60 05/22/2022 1606   GFRAA 98 01/02/2020 0955   Lab Results  Component Value Date   WBC 4.6 10/11/2023   NEUTROABS 1.4 10/11/2023   HGB 12.4 10/11/2023   HCT 37.5 10/11/2023   MCV 86 10/11/2023   PLT 312 10/11/2023   Imaging:  [facility] Clinician Interpretation: I have personally reviewed the CNS images as listed.  My interpretation, in the context of the patient's clinical presentation, is stable disease  MR BRAIN W WO CONTRAST Result Date: 06/05/2024 EXAM: MR Brain with and without Intravenous Contrast. CLINICAL HISTORY: Brain/CNS neoplasm, assess treatment response; pituitary protocol. TECHNIQUE: Magnetic resonance images of the brain with and without intravenous contrast in multiple  planes. CONTRAST: With and without. COMPARISON: MRI brain and sella 06/07/2023. FINDINGS: BRAIN: No restricted diffusion to indicate acute infarction. No intracranial hemorrhage. No midline shift or extra-axial fluid collection. The central arterial and venous flow voids are patent. Redemonstrated are  postsurgical changes relating to prior transanasal, transsphenoidal approach resection of a pituitary tumor. There is overall similar postsurgical appearance of the sella with leftward pituitary stalk deviation. There is continued slight decrease in size of residual enhancing tumor along the right lateral aspect of the sella, again with invasion of the right cavernous sinus. Tumor now measures approximately 1.7 x 1.2 cm in the coronal plane (TR x CC; series 26, image 6), previously 1.9 x 1.3 cm when measured in a similar fashion. VENTRICLES: No hydrocephalus. ORBITS: The orbits are normal. SINUSES AND MASTOIDS: There is scattered opacification of the bilateral ethmoid and left maxillary paranasal sinuses. The mastoid air cells are clear. BONES: No acute fracture or focal osseous lesion. IMPRESSION: 1. Since 06/07/2023, continued slight decrease in size of residual enhancing soft tissue along the right sella with overall similar invasion of the right cavernous sinus. Overall unchanged postsurgical findings related to prior transsphenoidal pituitary tumor resection. Electronically signed by: Prentice Spade 06/05/2024 10:58 AM EST RP Workstation: [id number]    Assessment/Plan Prolactinoma (HCC)  Loreli Heringer is clinically stable today.  MRI brain demonstrates stable findings over the past year interval.  We discussed downstream cognitive and memory effects of prior radiation exposure.  Counseled on lifestyle interventions to maintain good cognition.    She will continue to follow with endocrinology as well.  We ask that Keishawn Stolze return to clinic in 12 months following next brain MRI, or sooner as needed.  All questions were answered. The patient knows to call the clinic with any problems, questions or concerns. No barriers to learning were detected.  The total time spent in the encounter was 40 minutes and more than 50% was on counseling and review of test results   Arthea MARLA Manns,  MD Medical Director of Neuro-Oncology [facility] 06/17/2024 3:09 PM "

## 2024-06-20 ENCOUNTER — Other Ambulatory Visit: Payer: Self-pay | Admitting: Internal Medicine

## 2024-06-20 DIAGNOSIS — D352 Benign neoplasm of pituitary gland: Secondary | ICD-10-CM

## 2024-07-03 ENCOUNTER — Telehealth (INDEPENDENT_AMBULATORY_CARE_PROVIDER_SITE_OTHER): Payer: Self-pay | Admitting: Primary Care

## 2024-07-03 NOTE — Telephone Encounter (Signed)
 Spoke to pt about upcoming appt.. Will be present

## 2024-07-04 ENCOUNTER — Ambulatory Visit (INDEPENDENT_AMBULATORY_CARE_PROVIDER_SITE_OTHER): Admitting: Primary Care

## 2024-07-04 ENCOUNTER — Encounter (INDEPENDENT_AMBULATORY_CARE_PROVIDER_SITE_OTHER): Payer: Self-pay | Admitting: Primary Care

## 2024-07-04 VITALS — BP 130/84 | HR 61 | Resp 16 | Ht 69.0 in | Wt 274.6 lb

## 2024-07-04 DIAGNOSIS — Z1231 Encounter for screening mammogram for malignant neoplasm of breast: Secondary | ICD-10-CM

## 2024-07-04 DIAGNOSIS — R7989 Other specified abnormal findings of blood chemistry: Secondary | ICD-10-CM

## 2024-07-04 DIAGNOSIS — Z6841 Body Mass Index (BMI) 40.0 and over, adult: Secondary | ICD-10-CM | POA: Diagnosis not present

## 2024-07-04 DIAGNOSIS — I10 Essential (primary) hypertension: Secondary | ICD-10-CM | POA: Diagnosis not present

## 2024-07-04 DIAGNOSIS — E66813 Obesity, class 3: Secondary | ICD-10-CM | POA: Diagnosis not present

## 2024-07-04 DIAGNOSIS — E782 Mixed hyperlipidemia: Secondary | ICD-10-CM | POA: Diagnosis not present

## 2024-07-04 DIAGNOSIS — E876 Hypokalemia: Secondary | ICD-10-CM

## 2024-07-04 DIAGNOSIS — Z0000 Encounter for general adult medical examination without abnormal findings: Secondary | ICD-10-CM | POA: Diagnosis not present

## 2024-07-04 NOTE — Progress Notes (Signed)
 "  Complete physical exam  Patient: Beth [name]   DOB: 1976-05-31   49 y.o. Female  MRN: [id number]  Subjective:    Chief Complaint  Patient presents with   Annual Exam    Beth [name] is a 49 y.o. female who presents today for a complete physical exam. She reports consuming a low fat and low sodium diet. Home exercise routine includes exercise ball, stretching, and treadmill. Gym/ health club routine includes cardio and treadmill. She generally feels poorly. She reports sleeping fairly well. She does not have additional problems to discuss today.    Most recent fall risk assessment:    07/04/2024    8:45 AM  Fall Risk   Falls in the past year? 1  Number falls in past yr: 0  Injury with Fall? 1  Risk for fall due to : Impaired balance/gait  Follow up Falls evaluation completed;Education provided     Most recent depression screenings:    07/04/2024    8:45 AM 06/17/2024    3:30 PM  PHQ 2/9 Scores  PHQ - 2 Score 0 0    Vision:Within last year and Dental: No current dental problems  Patient Active Problem List   Diagnosis Date Noted   Class 3 severe obesity due to excess calories with body mass index (BMI) of 40.0 to 44.9 in adult [facility]) 03/30/2023   Hypoglycemia 06/16/2022   Status post transsphenoidal pituitary resection 01/04/2022   Low TSH level 10/21/2021   Dyslipidemia 07/15/2021   Osteoarthritis 08/20/2020   Sciatica 07/30/2020   DJD (degenerative joint disease)    Obstructive sleep apnea 03/11/2020   Snoring 02/05/2020   Pneumonia due to COVID-19 virus 12/15/2019   Acute respiratory failure with hypoxia (HCC) 12/15/2019   Hypokalemia 12/15/2019   Asthma without acute exacerbation 12/15/2019   Encounter for management of intrauterine contraceptive device (IUD) 06/09/2019   Essential hypertension 06/09/2019   Fatigue 06/09/2019   Slow transit constipation 06/09/2019   Hyperprolactinemia 02/15/2018   Migraine 10/12/2017   Chronic pancreatitis (HCC)  11/09/2016   Prolactinoma (HCC) 11/09/2016   BMI 38.0-38.9,adult 11/09/2016   Candida rash of groin 11/09/2016   Mild intermittent asthma 11/09/2016   Mixed hyperlipidemia 11/09/2016   Past Medical History:  Diagnosis Date   Asthma    Chronic pancreatitis (HCC)    DDD (degenerative disc disease), lumbosacral    DJD (degenerative joint disease)    Headache    Hyperlipidemia    Hypertension    Hypoglycemia    Obesity    Osteoarthritis    Prolactinoma (HCC)    Sciatica    Sleep apnea    Allergies[1]    Patient Care Team: Celestia Rosaline SQUIBB, NP as PCP - General (Internal Medicine) Lewis, Jasmine D, LCSW as [facility] Care Management (Licensed Clinical Social Worker)   Show/hide medication list[2]  ROS        Objective:     BP 130/84   Pulse 61   Resp 16   Ht 5' 9 (1.753 m)   Wt 274 lb 9.6 oz (124.6 kg)   SpO2 97%   BMI 40.55 kg/m  BP Readings from Last 3 Encounters:  07/04/24 130/84  06/17/24 132/76  04/02/24 114/76      Physical Exam Vitals reviewed.  Constitutional:      Appearance: Normal appearance. She is obese.  HENT:     Head: Normocephalic.     Right Ear: Tympanic membrane, ear canal and external ear normal.     Left Ear:  Tympanic membrane, ear canal and external ear normal.     Nose: Nose normal.     Mouth/Throat:     Mouth: Mucous membranes are moist.  Eyes:     Extraocular Movements: Extraocular movements intact.     Pupils: Pupils are equal, round, and reactive to light.  Cardiovascular:     Rate and Rhythm: Normal rate.  Pulmonary:     Effort: Pulmonary effort is normal.     Breath sounds: Normal breath sounds.  Abdominal:     General: Bowel sounds are normal.     Palpations: Abdomen is soft.  Musculoskeletal:        General: Normal range of motion.     Cervical back: Normal range of motion and neck supple.  Skin:    General: Skin is warm and dry.  Neurological:     Mental Status: She is alert and oriented to person, place, and  time.  Psychiatric:        Mood and Affect: Mood normal.        Behavior: Behavior normal.        Thought Content: Thought content normal.      No results found for any visits on 07/04/24. Last CBC Lab Results  Component Value Date   WBC 4.6 10/11/2023   HGB 12.4 10/11/2023   HCT 37.5 10/11/2023   MCV 86 10/11/2023   MCH 28.4 10/11/2023   RDW 14.6 10/11/2023   PLT 312 10/11/2023   Last metabolic panel Lab Results  Component Value Date   GLUCOSE 85 10/11/2023   NA 141 10/11/2023   K 4.3 10/11/2023   CL 106 10/11/2023   CO2 23 10/11/2023   BUN 9 10/11/2023   CREATININE 1.01 (H) 10/11/2023   EGFR 69 10/11/2023   CALCIUM  9.6 10/11/2023   PHOS 3.7 01/13/2022   PROT 6.6 10/11/2023   ALBUMIN 4.2 10/11/2023   LABGLOB 2.4 10/11/2023   AGRATIO 1.7 06/16/2022   BILITOT 0.4 10/11/2023   ALKPHOS 77 10/11/2023   AST 16 10/11/2023   ALT 15 10/11/2023   ANIONGAP 7 01/13/2022   Last lipids Lab Results  Component Value Date   CHOL 204 (H) 10/11/2023   HDL 35 (L) 10/11/2023   LDLCALC 138 (H) 10/11/2023   TRIG 174 (H) 10/11/2023   CHOLHDL 5.8 (H) 10/11/2023        Assessment & Plan:    Routine Health Maintenance and Physical Exam Beth [name] was seen today for annual exam.  Diagnoses and all orders for this visit:  Annual physical exam  Mixed hyperlipidemia -     Lipid panel  Essential hypertension Controlled  DIET: Limit salt intake, read nutrition labels to check salt content, limit fried and high fatty foods  Avoid using multisymptom OTC cold preparations that generally contain sudafed which can rise BP. Consult with pharmacist on best cold relief products to use for persons with HTN EXERCISE Discussed incorporating exercise such as walking - 30 minutes most days of the week and can do in 10 minute intervals    -     CBC with Differential/Platelet -     CMP14+EGFR  Hypokalemia -     CMP14+EGFR  Encounter for screening mammogram for malignant neoplasm of  breast -     MM 3D SCREENING MAMMOGRAM BILATERAL BREAST; Future  Class 3 severe obesity due to excess calories with body mass index (BMI) of 40.0 to 44.9 in adult, unspecified whether serious comorbidity present (HCC) Discussed diet and exercise for person  with BMI >25. Instructed: You must burn more calories than you eat. Losing 5 percent of your body weight should be considered a success. In the longer term, losing more than 15 percent of your body weight and staying at this weight is an extremely good result. However, keep in mind that even losing 5 percent of your body weight leads to important health benefits, so try not to get discouraged if you're not able to lose more than this. Will recheck weight in 3-6 months.    Low TSH level  TSH/T4  Immunization History  Administered Date(s) Administered   PFIZER(Purple Top)SARS-COV-2 Vaccination 04/23/2020, 05/14/2020, 10/30/2020    Health Maintenance  Topic Date Due   Pneumococcal Vaccine (1 of 2 - PCV) Never done   Hepatitis B Vaccines 19-59 Average Risk (1 of 3 - 19+ 3-dose series) Never done   Mammogram  Never done   COVID-19 Vaccine (4 - 2025-26 season) 02/25/2024   Influenza Vaccine  09/23/2024 (Originally 01/25/2024)   Cervical Cancer Screening (HPV/Pap Cotest)  08/20/2025   Colonoscopy  06/24/2029   Hepatitis C Screening  Completed   HIV Screening  Completed   HPV VACCINES  Aged Out   Meningococcal B Vaccine  Aged Out   DTaP/Tdap/Td  Discontinued    Discussed health benefits of physical activity, and encouraged her to engage in regular exercise appropriate for her age and condition.  Problem List Items Addressed This Visit     Mixed hyperlipidemia   Relevant Orders   Lipid panel   Hypokalemia   Relevant Orders   CMP14+EGFR   Essential hypertension   Relevant Orders   CBC with Differential/Platelet   CMP14+EGFR   Class 3 severe obesity due to excess calories with body mass index (BMI) of 40.0 to 44.9 in adult [facility])    Other Visit Diagnoses       Annual physical exam    -  Primary     Encounter for screening mammogram for malignant neoplasm of breast       Relevant Orders   MM 3D SCREENING MAMMOGRAM BILATERAL BREAST           Rosaline SHAUNNA Bohr, NP      [1]  Allergies Allergen Reactions   Bahia Hives, Shortness Of Breath and Swelling    Found through allergy testing, not sure which item caused the shortness of breath   Bermuda Grass Hives, Shortness Of Breath and Swelling    Found through allergy testing, not sure which item caused the shortness of breath   [employer], Shortness Of Breath and Swelling    Found through allergy testing, not sure which item caused the shortness of breath   Cladosporium Cladosporioides Hives, Shortness Of Breath and Swelling    Found through allergy testing, not sure which item caused the shortness of breath   Dust Mite Extract Hives, Shortness Of Breath and Swelling    Found through allergy testing, not sure which item caused the shortness of breath   Elm Bark [Ulmus Fulva] Hives, Shortness Of Breath and Swelling    Found through allergy testing, not sure which item caused the shortness of breath   Molds & Smuts Hives, Shortness Of Breath and Swelling    Found through allergy testing, not sure which item caused the shortness of breath   Mugwort Hives, Shortness Of Breath and Swelling    Found through allergy testing, not sure which item caused the shortness of breath   Nettle [Nettle (Urtica Dioica)] Hives, Shortness Of  Breath and Swelling   Red Mulberry Allergy Skin Test Hives, Shortness Of Breath and Swelling    Found through allergy testing, not sure which item caused the shortness of breath   Sorrel-Dock Mix [Sheep Sorrel-Yellow Dock] Hives, Shortness Of Breath and Swelling    Found through allergy testing, not sure which item caused the shortness of breath   Timothy Grass Pollen Allergen Hives, Shortness Of Breath and Swelling    Found through  allergy testing, not sure which item caused the shortness of breath   American Cockroach    Mixed Ragweed   [2]  Outpatient Medications Prior to Visit  Medication Sig   benzonatate  (TESSALON ) 200 MG capsule Take 1 capsule (200 mg total) by mouth 3 (three) times daily as needed for cough.   Cholecalciferol (VITAMIN D3 PO) Take 2,000 mg by mouth daily. On Hold   ondansetron  (ZOFRAN -ODT) 8 MG disintegrating tablet Take 1 tablet (8 mg total) by mouth every 8 (eight) hours as needed for nausea or vomiting.   albuterol  (VENTOLIN  HFA) 108 (90 Base) MCG/ACT inhaler Inhale 2 puffs into the lungs every 6 (six) hours as needed for wheezing or shortness of breath.   Ascorbic Acid  (VITAMIN C) 1000 MG tablet Take 1,000 mg by mouth daily.   Butenafine  HCl (MENTAX) 1 % cream Apply 1 application topically 2 (two) times daily.   Camphor-Menthol-Methyl Sal (TIGER BALM MUSCLE RUB EX) Apply 1 oz topically as needed (patient uses asa needed).   Docusate Sodium  (COLACE PO) Take 1 capsule by mouth daily.   EPINEPHrine  0.3 mg/0.3 mL IJ SOAJ injection Inject 0.3 mg into the muscle as needed for anaphylaxis.   fenofibrate  (TRICOR ) 145 MG tablet Take 1 tablet (145 mg total) by mouth daily.   fluticasone  (FLONASE ) 50 MCG/ACT nasal spray Place 2 sprays into both nostrils daily.   ipratropium-albuterol  (DUONEB) 0.5-2.5 (3) MG/3ML SOLN Take 3 mLs by nebulization every 6 (six) hours as needed (shortness of breath, wheezing, and persistent cough).   levonorgestrel  (MIRENA ) 20 MCG/24HR IUD 1 each by Intrauterine route once.    LORazepam  (ATIVAN ) 1 MG tablet Take 1 tablet (1 mg total) by mouth every 8 (eight) hours as needed for anxiety (mri).   Menthol, Topical Analgesic, (BIOFREEZE) 10 % CREA Apply 1 Application topically daily as needed (pain).   Pancrelipase , Lip-Prot-Amyl, (CREON ) 24000-76000 units CPEP Take 2 capsules by mouth See admin instructions. Take 2 capsules three times daily with food and 1 capsule twice daily with  snacks.   pantoprazole  (PROTONIX ) 20 MG tablet Take 20 mg by mouth daily.   promethazine -dextromethorphan  (PROMETHAZINE -DM) 6.25-15 MG/5ML syrup Take 5 mLs by mouth 3 (three) times daily as needed for cough.   psyllium (REGULOID) 0.52 g capsule Take 0.52 capsules by mouth as needed.   rosuvastatin  (CRESTOR ) 40 MG tablet Take 1 tablet (40 mg total) by mouth daily.   Zinc  Sulfate (ZINC  15 PO) Take 15 mg by mouth daily.   No facility-administered medications prior to visit.   "

## 2024-07-05 LAB — LIPID PANEL
Chol/HDL Ratio: 3.3 ratio (ref 0.0–4.4)
Cholesterol, Total: 159 mg/dL (ref 100–199)
HDL: 48 mg/dL
LDL Chol Calc (NIH): 95 mg/dL (ref 0–99)
Triglycerides: 87 mg/dL (ref 0–149)
VLDL Cholesterol Cal: 16 mg/dL (ref 5–40)

## 2024-07-05 LAB — CBC WITH DIFFERENTIAL/PLATELET
Basophils Absolute: 0 x10E3/uL (ref 0.0–0.2)
Basos: 1 %
EOS (ABSOLUTE): 0.2 x10E3/uL (ref 0.0–0.4)
Eos: 5 %
Hematocrit: 36.8 % (ref 34.0–46.6)
Hemoglobin: 12 g/dL (ref 11.1–15.9)
Immature Grans (Abs): 0 x10E3/uL (ref 0.0–0.1)
Immature Granulocytes: 0 %
Lymphocytes Absolute: 2.4 x10E3/uL (ref 0.7–3.1)
Lymphs: 53 %
MCH: 28.8 pg (ref 26.6–33.0)
MCHC: 32.6 g/dL (ref 31.5–35.7)
MCV: 89 fL (ref 79–97)
Monocytes Absolute: 0.4 x10E3/uL (ref 0.1–0.9)
Monocytes: 9 %
Neutrophils Absolute: 1.5 x10E3/uL (ref 1.4–7.0)
Neutrophils: 32 %
Platelets: 277 x10E3/uL (ref 150–450)
RBC: 4.16 x10E6/uL (ref 3.77–5.28)
RDW: 14.1 % (ref 11.7–15.4)
WBC: 4.5 x10E3/uL (ref 3.4–10.8)

## 2024-07-05 LAB — CMP14+EGFR
ALT: 12 IU/L (ref 0–32)
AST: 18 IU/L (ref 0–40)
Albumin: 4.4 g/dL (ref 3.9–4.9)
Alkaline Phosphatase: 65 IU/L (ref 41–116)
BUN/Creatinine Ratio: 12 (ref 9–23)
BUN: 13 mg/dL (ref 6–24)
Bilirubin Total: 0.2 mg/dL (ref 0.0–1.2)
CO2: 24 mmol/L (ref 20–29)
Calcium: 9.7 mg/dL (ref 8.7–10.2)
Chloride: 105 mmol/L (ref 96–106)
Creatinine, Ser: 1.07 mg/dL — ABNORMAL HIGH (ref 0.57–1.00)
Globulin, Total: 2.6 g/dL (ref 1.5–4.5)
Glucose: 88 mg/dL (ref 70–99)
Potassium: 4.3 mmol/L (ref 3.5–5.2)
Sodium: 144 mmol/L (ref 134–144)
Total Protein: 7 g/dL (ref 6.0–8.5)
eGFR: 64 mL/min/1.73

## 2024-07-07 ENCOUNTER — Ambulatory Visit (INDEPENDENT_AMBULATORY_CARE_PROVIDER_SITE_OTHER): Payer: Self-pay | Admitting: Primary Care

## 2024-07-07 NOTE — Telephone Encounter (Signed)
 Will forward to provider

## 2024-07-10 NOTE — Telephone Encounter (Signed)
 Y would pt being going to [facility] to get tsh drawn?

## 2024-07-18 ENCOUNTER — Other Ambulatory Visit (INDEPENDENT_AMBULATORY_CARE_PROVIDER_SITE_OTHER): Payer: Self-pay

## 2024-07-18 DIAGNOSIS — R7989 Other specified abnormal findings of blood chemistry: Secondary | ICD-10-CM

## 2025-01-02 ENCOUNTER — Ambulatory Visit: Admitting: Internal Medicine

## 2025-01-02 ENCOUNTER — Ambulatory Visit (INDEPENDENT_AMBULATORY_CARE_PROVIDER_SITE_OTHER): Payer: Self-pay | Admitting: Primary Care

## 2025-01-09 ENCOUNTER — Ambulatory Visit: Admitting: Internal Medicine

## 2025-06-11 ENCOUNTER — Other Ambulatory Visit

## 2025-06-16 ENCOUNTER — Inpatient Hospital Stay: Admitting: Internal Medicine

## 2025-06-25 ENCOUNTER — Inpatient Hospital Stay: Admitting: Internal Medicine
